# Patient Record
Sex: Male | Born: 1940
Health system: Southern US, Community
[De-identification: ages and names within clinical notes are randomized; demographics above are authoritative.]

## PROBLEM LIST (undated history)

## (undated) DIAGNOSIS — I10 Essential (primary) hypertension: Secondary | ICD-10-CM

## (undated) DIAGNOSIS — E119 Type 2 diabetes mellitus without complications: Secondary | ICD-10-CM

## (undated) DIAGNOSIS — M109 Gout, unspecified: Secondary | ICD-10-CM

---

## 2012-03-15 DIAGNOSIS — I2510 Atherosclerotic heart disease of native coronary artery without angina pectoris: Secondary | ICD-10-CM

## 2012-03-15 HISTORY — DX: Atherosclerotic heart disease of native coronary artery without angina pectoris: I25.10

## 2012-03-15 HISTORY — PX: CARDIAC CATHETERIZATION: SHX172

## 2012-06-09 DIAGNOSIS — A4101 Sepsis due to Methicillin susceptible Staphylococcus aureus: Secondary | ICD-10-CM

## 2012-06-09 HISTORY — DX: Sepsis due to methicillin susceptible Staphylococcus aureus: A41.01

## 2013-04-19 DIAGNOSIS — G4733 Obstructive sleep apnea (adult) (pediatric): Secondary | ICD-10-CM

## 2013-04-19 HISTORY — DX: Obstructive sleep apnea (adult) (pediatric): G47.33

## 2019-05-31 ENCOUNTER — Inpatient Hospital Stay (HOSPITAL_COMMUNITY): Payer: Medicare Other

## 2019-05-31 ENCOUNTER — Inpatient Hospital Stay (HOSPITAL_COMMUNITY)
Admission: AD | Admit: 2019-05-31 | Discharge: 2019-06-07 | DRG: 814 | Disposition: A | Discharge status: Home Health | Payer: Medicare Other | Source: Other Acute Inpatient Hospital | Attending: Internal Medicine | Admitting: Internal Medicine

## 2019-05-31 ENCOUNTER — Encounter (HOSPITAL_COMMUNITY): Payer: Self-pay | Admitting: Internal Medicine

## 2019-05-31 DIAGNOSIS — M109 Gout, unspecified: Secondary | ICD-10-CM | POA: Diagnosis present

## 2019-05-31 DIAGNOSIS — Z8262 Family history of osteoporosis: Secondary | ICD-10-CM

## 2019-05-31 DIAGNOSIS — R579 Shock, unspecified: Secondary | ICD-10-CM | POA: Diagnosis not present

## 2019-05-31 DIAGNOSIS — R2232 Localized swelling, mass and lump, left upper limb: Secondary | ICD-10-CM | POA: Diagnosis not present

## 2019-05-31 DIAGNOSIS — Z79899 Other long term (current) drug therapy: Secondary | ICD-10-CM | POA: Diagnosis not present

## 2019-05-31 DIAGNOSIS — E872 Acidosis: Secondary | ICD-10-CM | POA: Diagnosis present

## 2019-05-31 DIAGNOSIS — E860 Dehydration: Secondary | ICD-10-CM | POA: Diagnosis present

## 2019-05-31 DIAGNOSIS — R748 Abnormal levels of other serum enzymes: Secondary | ICD-10-CM

## 2019-05-31 DIAGNOSIS — A419 Sepsis, unspecified organism: Secondary | ICD-10-CM | POA: Diagnosis present

## 2019-05-31 DIAGNOSIS — I4891 Unspecified atrial fibrillation: Secondary | ICD-10-CM | POA: Diagnosis not present

## 2019-05-31 DIAGNOSIS — I509 Heart failure, unspecified: Secondary | ICD-10-CM | POA: Diagnosis present

## 2019-05-31 DIAGNOSIS — E1165 Type 2 diabetes mellitus with hyperglycemia: Secondary | ICD-10-CM | POA: Diagnosis present

## 2019-05-31 DIAGNOSIS — T504X5A Adverse effect of drugs affecting uric acid metabolism, initial encounter: Secondary | ICD-10-CM | POA: Diagnosis present

## 2019-05-31 DIAGNOSIS — I110 Hypertensive heart disease with heart failure: Secondary | ICD-10-CM | POA: Diagnosis present

## 2019-05-31 DIAGNOSIS — E785 Hyperlipidemia, unspecified: Secondary | ICD-10-CM | POA: Diagnosis present

## 2019-05-31 DIAGNOSIS — D649 Anemia, unspecified: Secondary | ICD-10-CM | POA: Diagnosis present

## 2019-05-31 DIAGNOSIS — F17220 Nicotine dependence, chewing tobacco, uncomplicated: Secondary | ICD-10-CM | POA: Diagnosis present

## 2019-05-31 DIAGNOSIS — Z7982 Long term (current) use of aspirin: Secondary | ICD-10-CM

## 2019-05-31 DIAGNOSIS — R21 Rash and other nonspecific skin eruption: Secondary | ICD-10-CM

## 2019-05-31 DIAGNOSIS — R571 Hypovolemic shock: Secondary | ICD-10-CM | POA: Diagnosis present

## 2019-05-31 DIAGNOSIS — Z8249 Family history of ischemic heart disease and other diseases of the circulatory system: Secondary | ICD-10-CM | POA: Diagnosis not present

## 2019-05-31 DIAGNOSIS — R0600 Dyspnea, unspecified: Secondary | ICD-10-CM

## 2019-05-31 DIAGNOSIS — D696 Thrombocytopenia, unspecified: Secondary | ICD-10-CM | POA: Diagnosis present

## 2019-05-31 DIAGNOSIS — I77810 Thoracic aortic ectasia: Secondary | ICD-10-CM | POA: Diagnosis present

## 2019-05-31 DIAGNOSIS — N179 Acute kidney failure, unspecified: Secondary | ICD-10-CM

## 2019-05-31 DIAGNOSIS — I480 Paroxysmal atrial fibrillation: Secondary | ICD-10-CM | POA: Diagnosis present

## 2019-05-31 DIAGNOSIS — I420 Dilated cardiomyopathy: Secondary | ICD-10-CM | POA: Diagnosis present

## 2019-05-31 DIAGNOSIS — E119 Type 2 diabetes mellitus without complications: Secondary | ICD-10-CM | POA: Diagnosis not present

## 2019-05-31 DIAGNOSIS — I081 Rheumatic disorders of both mitral and tricuspid valves: Secondary | ICD-10-CM | POA: Diagnosis present

## 2019-05-31 DIAGNOSIS — I2510 Atherosclerotic heart disease of native coronary artery without angina pectoris: Secondary | ICD-10-CM | POA: Diagnosis present

## 2019-05-31 DIAGNOSIS — D7212 Drug rash with eosinophilia and systemic symptoms syndrome: Secondary | ICD-10-CM | POA: Diagnosis present

## 2019-05-31 DIAGNOSIS — Z888 Allergy status to other drugs, medicaments and biological substances status: Secondary | ICD-10-CM

## 2019-05-31 DIAGNOSIS — I340 Nonrheumatic mitral (valve) insufficiency: Secondary | ICD-10-CM | POA: Diagnosis not present

## 2019-05-31 DIAGNOSIS — T50905A Adverse effect of unspecified drugs, medicaments and biological substances, initial encounter: Secondary | ICD-10-CM | POA: Diagnosis not present

## 2019-05-31 DIAGNOSIS — M7989 Other specified soft tissue disorders: Secondary | ICD-10-CM | POA: Diagnosis not present

## 2019-05-31 HISTORY — DX: Gout, unspecified: M10.9

## 2019-05-31 HISTORY — DX: Essential (primary) hypertension: I10

## 2019-05-31 HISTORY — DX: Sepsis, unspecified organism: A41.9

## 2019-05-31 HISTORY — DX: Type 2 diabetes mellitus without complications: E11.9

## 2019-05-31 HISTORY — DX: Shock, unspecified: R57.9

## 2019-05-31 HISTORY — DX: Acute kidney failure, unspecified: N17.9

## 2019-05-31 LAB — POCT I-STAT 7, (LYTES, BLD GAS, ICA,H+H)
Acid-base deficit: 11 mmol/L — ABNORMAL HIGH (ref 0.0–2.0)
Bicarbonate: 14.7 mmol/L — ABNORMAL LOW (ref 20.0–28.0)
Calcium, Ion: 1.11 mmol/L — ABNORMAL LOW (ref 1.15–1.40)
HCT: 25 % — ABNORMAL LOW (ref 39.0–52.0)
Hemoglobin: 8.5 g/dL — ABNORMAL LOW (ref 13.0–17.0)
O2 Saturation: 95 %
Potassium: 5.1 mmol/L (ref 3.5–5.1)
Sodium: 139 mmol/L (ref 135–145)
TCO2: 16 mmol/L — ABNORMAL LOW (ref 22–32)
pCO2 arterial: 30.2 mmHg — ABNORMAL LOW (ref 32.0–48.0)
pH, Arterial: 7.295 — ABNORMAL LOW (ref 7.350–7.450)
pO2, Arterial: 82 mmHg — ABNORMAL LOW (ref 83.0–108.0)

## 2019-05-31 LAB — GLUCOSE, CAPILLARY: Glucose-Capillary: 164 mg/dL — ABNORMAL HIGH (ref 70–99)

## 2019-05-31 MED ORDER — SODIUM CHLORIDE 0.9 % IV SOLN
10.00 | INTRAVENOUS | Status: DC
Start: ? — End: 2019-05-31

## 2019-05-31 MED ORDER — NOREPINEPHRINE 4 MG/250ML-% IV SOLN
0.0000 ug/min | INTRAVENOUS | Status: DC
  Administered 2019-05-31: 9 ug/min via INTRAVENOUS

## 2019-05-31 MED ORDER — NOREPINEPHRINE 16 MG/250ML-% IV SOLN: 0.0000 ug/min | INTRAVENOUS | Status: DC

## 2019-05-31 MED ORDER — LACTATED RINGERS IV SOLN: INTRAVENOUS | Status: DC

## 2019-05-31 MED ORDER — LACTATED RINGERS IV SOLN
100.00 | INTRAVENOUS | Status: DC
Start: ? — End: 2019-05-31

## 2019-05-31 MED ORDER — HEPARIN SODIUM (PORCINE) 5000 UNIT/ML IJ SOLN
5000.0000 [IU] | Freq: Three times a day (TID) | INTRAMUSCULAR | Status: DC
  Administered 2019-05-31 – 2019-06-04 (×11): 5000 [IU] via SUBCUTANEOUS
  Filled 2019-05-31 (×11): qty 1

## 2019-05-31 MED ORDER — GENERIC EXTERNAL MEDICATION
Status: DC
Start: ? — End: 2019-05-31

## 2019-05-31 MED ORDER — STERILE WATER FOR INJECTION IV SOLN
INTRAVENOUS | Status: DC
  Filled 2019-05-31 (×10): qty 850

## 2019-05-31 MED ORDER — SODIUM CHLORIDE 0.9 % IV SOLN
INTRAVENOUS | Status: DC | PRN
  Administered 2019-05-31: 250 mL via INTRAVENOUS

## 2019-05-31 MED ORDER — WHITE PETROLATUM EX OINT
TOPICAL_OINTMENT | CUTANEOUS | Status: AC
  Filled 2019-05-31: qty 28.35

## 2019-05-31 MED ORDER — NOREPINEPHRINE-DEXTROSE 8-5 MG/250ML-% IV SOLN
0.10 | INTRAVENOUS | Status: DC
Start: ? — End: 2019-05-31

## 2019-05-31 MED ORDER — NOREPINEPHRINE 4 MG/250ML-% IV SOLN
INTRAVENOUS | Status: AC
  Filled 2019-05-31: qty 250

## 2019-05-31 NOTE — Significant Event (Signed)
Returned call from [facility] regarding this patient who is at [facility] (free-standing ED about 30 mins away). They were initially accepted to [facility] by hospitalist service, but their clinical condition has deteriorated and they now require ICU level care.  Briefly, this is a 48M who presented today with generalized malaise and a diffuse whole-body rash. He was found to be hypotensive. Initially fluid-responsive. Received 3L IVF in total. BP then began dropping again (currently SBP 70s) so ED physician is starting them on a levophed drip prior to transfer to [facility].  He has received vanc/cefepime empirically for possible septic shock.  Labs are notable for slight leukocytosis with 8% eosinophilia (ANC ~1000k) which raises suspicion for DRESS syndrome given the patient's toxic presentation and whole body rash. I have asked the ER to enquire about any new drugs since the patient's mentation may worsen between now and when he arrives at [facility]. I have also asked them to administer IV steroids to empirically treat for both shock state and possible DRESS.  I also note poor respiratory compensation for his metabolic acidosis: ABG: 7.19/39 on arrival. I have asked the ED to repeat this prior to transfer to rule out impending respiratory failure with concomitant failure to compensate for his acidosis as an etiology for his worsening shock/hypotension.  Plan: - Repeat ABG. - CVL to be placed by ER prior to transport given levophed requirement and worsening shock - IV steroids to be given by ER prior to transport. - Accepted to [facility] ICU for management of emergent life-threatening condition. Acccepting physician is Dr. Clayton Lefort.

## 2019-05-31 NOTE — H&P (Signed)
NAME:  Zachary Parrish, MRN:  [id number], DOB:  05-17-1940, LOS: 0 ADMISSION DATE:  05/31/2019, CONSULTATION DATE: 3/18 REFERRING MD:  Dr. Malachi Carl EDP, CHIEF COMPLAINT:  shock  Brief History   79 year old male admitted to [facility] on 3/18 with shock requiring Levophed.  Complaints of rash and malaise for the preceding 3 weeks after starting colchicine and allopurinol for gout.  History of present illness   79 year old male with past medical history as below, which is significant for hypertension, diabetes, and gout.  He was in his usual state of health until about 5 to 6 weeks prior to arrival when he developed unilateral knee pain and swelling.  He had experiences months prior but the symptoms were short-lived.  He presented to his PCP with these complaints and was diagnosed with gout and started on allopurinol and colchicine.  A few days after these medications he developed diarrhea and abdominal pain.  For these reasons he stopped colchicine at the request of his primary care physician.  His symptoms continued to progress and started to include rash, poor appetite, and weakness.  It was at this time approximately 3/9 that his allopurinol was discontinued.  Since that time his fatigue and malaise have been progressive and his rash not improved.  On 3/18 he was noted to be hypotensive on home BP monitor with systolic blood pressure of 70.  He presented to [facility] emergency department.  Where his blood pressure was confirmed to be low and he was given 3 L of normal saline.  He was empirically treated with vancomycin and cefepime.  Laboratory evaluation was significant for creatinine of 7.16, lactic acid 2.7, and elevated LFTs.  He was scheduled for transfer to stepdown unit at [facility], however, his blood pressure began to decrease he was started on Levophed.  At that point he was transferred to [facility], ICU.  Past Medical History   has a past medical history of DM (diabetes  mellitus) ([place]), Gout, and HTN (hypertension).   Significant Hospital Events   3/18  Consults:    Procedures:  CVL RIJ 3/18 >  Significant Diagnostic Tests:  CT abdomen 3/18 >  Micro Data:  Blood 3/18 >   Antimicrobials:  Cefepime 3/18 Vancomycin 3/18  Interim history/subjective:  See HPI  Objective   Blood pressure (!) 129/48, pulse 77, resp. rate 18, height 6\' 1"  (1.854 m), weight 100.2 kg, SpO2 96 %.        Intake/Output Summary (Last 24 hours) at 05/31/2019 2243 Last data filed at 05/31/2019 2215 Gross per 24 hour  Intake --  Output 150 ml  Net -150 ml   Filed Weights   05/31/19 2219  Weight: 100.2 kg    Examination: General: elderly appearing male in NAD HENT: [place]/AT, PERRL, no JVD Lungs: Clear bilateral breath sounds Cardiovascular: RRR no MRG Abdomen: Soft, non-tender, non-distended Extremities: No acute deformity or ROM limitation Neuro: Alert, oriented, non-focal GU: Foley Skin: Non-raised rash covering almost entire body. Sparing face, palms, and soles of feet. No blisters or sloughing.   Resolved Hospital Problem list     Assessment & Plan:   Shock: I suspect he remains hypovolemic as a chief etiology. He is afebrile with only mild leukocytosis and no infectious complaints. Cannot rule out septic shock. PCT elevated at 0.52 in ED. - Continue levophed for MAP goal 57mmHg - Resuscitate with additional one liter LR - Check lactic - Empiric antibiotics - Blood cultures - Trend PCT. Low threshold to DC  antibiotics.  - Echocardiogram  Rash: DRESS syndrome? He has rash, eosinophilia, multi-organ involvement. Scores 4 on Registry of Severe Cutaneous Adverse Reactions (RegiSCAR) scoring system which would indicate probable DRESS. Rash and duration atypical for SHS. Allopurinol and colchicine were new medications for him about 5-6 weeks ago. Colchicine stopped after about 2 weeks and Allopurinol stopped only about one week ago.  - Patch testing  unlikely to be positive in cases of allopurinol induced DRESS - Systemic glucocorticoids - Holding allopurinol  AKI: prerenal vs DRESS - Hydrate - Steroids - Follow BMP  Transaminitis: shock liver +/- DRESS complication - Trend LFT  Non gap metabolic acidosis: pH in ED  7.19 renal failure - check lactic, BMP - Start Bicarb infusion  Troponin elevation: 0.044 at [facility] ED - EKG - Trend troponin  Best practice:  Diet: NPO Pain/Anxiety/Delirium protocol (if indicated): NA VAP protocol (if indicated): NA DVT prophylaxis: SQH GI prophylaxis: NA Glucose control: NA Mobility: BR Code Status: FULL Family Communication: Patient updated bedside Disposition: ICU - pressors  Labs   CBC: No results for input(s): WBC, NEUTROABS, HGB, HCT, MCV, PLT in the last 168 hours.  Basic Metabolic Panel: No results for input(s): NA, K, CL, CO2, GLUCOSE, BUN, CREATININE, CALCIUM, MG, PHOS in the last 168 hours. GFR: CrCl cannot be calculated (No successful lab value found.). No results for input(s): PROCALCITON, WBC, LATICACIDVEN in the last 168 hours.  Liver Function Tests: No results for input(s): AST, ALT, ALKPHOS, BILITOT, PROT, ALBUMIN in the last 168 hours. No results for input(s): LIPASE, AMYLASE in the last 168 hours. No results for input(s): AMMONIA in the last 168 hours.  ABG No results found for: PHART, PCO2ART, PO2ART, HCO3, TCO2, ACIDBASEDEF, O2SAT   Coagulation Profile: No results for input(s): INR, PROTIME in the last 168 hours.  Cardiac Enzymes: No results for input(s): CKTOTAL, CKMB, CKMBINDEX, TROPONINI in the last 168 hours.  HbA1C: No results found for: HGBA1C  CBG: No results for input(s): GLUCAP in the last 168 hours.  Review of Systems:   Bolds are positive  Constitutional: weight loss, gain, night sweats, Fevers, chills, fatigue .  HEENT: headaches, Sore throat, sneezing, nasal congestion, post nasal drip, Difficulty swallowing, Tooth/dental  problems, visual complaints visual changes, ear ache CV:  chest pain, radiates:,Orthopnea, PND, swelling in lower extremities, dizziness, palpitations, syncope.  GI  heartburn, indigestion, abdominal pain, nausea, vomiting, diarrhea, change in bowel habits, loss of appetite, bloody stools.  Resp: cough, productive: , hemoptysis, dyspnea, chest pain, pleuritic.  Skin: rash or itching or icterus GU: dysuria, change in color of urine, urgency or frequency. flank pain, hematuria  MS: joint pain or swelling. decreased range of motion  Psych: change in mood or affect. depression or anxiety.  Neuro: difficulty with speech, weakness, numbness, ataxia    Past Medical History  He,  has no past medical history on file.   Surgical History     Social History      Family History   His family history is not on file.   Allergies Not on File   Home Medications  Prior to Admission medications   Not on File     Critical care time: 50 minutes     Joneen Roach, AGACNP-BC [facility] Pulmonary/Critical Care  See Amion for personal pager PCCM on call pager [phone]  05/31/2019 11:45 PM       ,

## 2019-06-01 ENCOUNTER — Inpatient Hospital Stay (HOSPITAL_COMMUNITY): Payer: Medicare Other

## 2019-06-01 DIAGNOSIS — R571 Hypovolemic shock: Secondary | ICD-10-CM

## 2019-06-01 LAB — COMPREHENSIVE METABOLIC PANEL
ALT: 124 U/L — ABNORMAL HIGH (ref 0–44)
ALT: 116 U/L — ABNORMAL HIGH (ref 0–44)
ALT: 113 U/L — ABNORMAL HIGH (ref 0–44)
ALT: 113 U/L — ABNORMAL HIGH (ref 0–44)
AST: 122 U/L — ABNORMAL HIGH (ref 15–41)
AST: 111 U/L — ABNORMAL HIGH (ref 15–41)
AST: 98 U/L — ABNORMAL HIGH (ref 15–41)
AST: 99 U/L — ABNORMAL HIGH (ref 15–41)
Albumin: 2.5 g/dL — ABNORMAL LOW (ref 3.5–5.0)
Albumin: 2.4 g/dL — ABNORMAL LOW (ref 3.5–5.0)
Albumin: 2.3 g/dL — ABNORMAL LOW (ref 3.5–5.0)
Albumin: 2.4 g/dL — ABNORMAL LOW (ref 3.5–5.0)
Alkaline Phosphatase: 178 U/L — ABNORMAL HIGH (ref 38–126)
Alkaline Phosphatase: 175 U/L — ABNORMAL HIGH (ref 38–126)
Alkaline Phosphatase: 186 U/L — ABNORMAL HIGH (ref 38–126)
Alkaline Phosphatase: 196 U/L — ABNORMAL HIGH (ref 38–126)
Anion gap: 14 (ref 5–15)
Anion gap: 14 (ref 5–15)
Anion gap: 12 (ref 5–15)
Anion gap: 14 (ref 5–15)
BUN: 121 mg/dL — ABNORMAL HIGH (ref 8–23)
BUN: 115 mg/dL — ABNORMAL HIGH (ref 8–23)
BUN: 110 mg/dL — ABNORMAL HIGH (ref 8–23)
BUN: 114 mg/dL — ABNORMAL HIGH (ref 8–23)
CO2: 14 mmol/L — ABNORMAL LOW (ref 22–32)
CO2: 16 mmol/L — ABNORMAL LOW (ref 22–32)
CO2: 19 mmol/L — ABNORMAL LOW (ref 22–32)
CO2: 18 mmol/L — ABNORMAL LOW (ref 22–32)
Calcium: 7.2 mg/dL — ABNORMAL LOW (ref 8.9–10.3)
Calcium: 7.3 mg/dL — ABNORMAL LOW (ref 8.9–10.3)
Calcium: 7.4 mg/dL — ABNORMAL LOW (ref 8.9–10.3)
Calcium: 7.4 mg/dL — ABNORMAL LOW (ref 8.9–10.3)
Chloride: 110 mmol/L (ref 98–111)
Chloride: 109 mmol/L (ref 98–111)
Chloride: 109 mmol/L (ref 98–111)
Chloride: 107 mmol/L (ref 98–111)
Creatinine, Ser: 6.49 mg/dL — ABNORMAL HIGH (ref 0.61–1.24)
Creatinine, Ser: 6.09 mg/dL — ABNORMAL HIGH (ref 0.61–1.24)
Creatinine, Ser: 5.7 mg/dL — ABNORMAL HIGH (ref 0.61–1.24)
Creatinine, Ser: 5.68 mg/dL — ABNORMAL HIGH (ref 0.61–1.24)
GFR calc Af Amer: 9 mL/min — ABNORMAL LOW (ref >60)
GFR calc Af Amer: 9 mL/min — ABNORMAL LOW (ref >60)
GFR calc Af Amer: 10 mL/min — ABNORMAL LOW (ref >60)
GFR calc Af Amer: 10 mL/min — ABNORMAL LOW (ref >60)
GFR calc non Af Amer: 8 mL/min — ABNORMAL LOW (ref >60)
GFR calc non Af Amer: 8 mL/min — ABNORMAL LOW (ref >60)
GFR calc non Af Amer: 9 mL/min — ABNORMAL LOW (ref >60)
GFR calc non Af Amer: 9 mL/min — ABNORMAL LOW (ref >60)
Glucose, Bld: 186 mg/dL — ABNORMAL HIGH (ref 70–99)
Glucose, Bld: 176 mg/dL — ABNORMAL HIGH (ref 70–99)
Glucose, Bld: 187 mg/dL — ABNORMAL HIGH (ref 70–99)
Glucose, Bld: 226 mg/dL — ABNORMAL HIGH (ref 70–99)
Potassium: 5.2 mmol/L — ABNORMAL HIGH (ref 3.5–5.1)
Potassium: 5.1 mmol/L (ref 3.5–5.1)
Potassium: 4.8 mmol/L (ref 3.5–5.1)
Potassium: 4.7 mmol/L (ref 3.5–5.1)
Sodium: 138 mmol/L (ref 135–145)
Sodium: 139 mmol/L (ref 135–145)
Sodium: 140 mmol/L (ref 135–145)
Sodium: 139 mmol/L (ref 135–145)
Total Bilirubin: 2.4 mg/dL — ABNORMAL HIGH (ref 0.3–1.2)
Total Bilirubin: 2.5 mg/dL — ABNORMAL HIGH (ref 0.3–1.2)
Total Bilirubin: 1.9 mg/dL — ABNORMAL HIGH (ref 0.3–1.2)
Total Bilirubin: 2.3 mg/dL — ABNORMAL HIGH (ref 0.3–1.2)
Total Protein: 5.3 g/dL — ABNORMAL LOW (ref 6.5–8.1)
Total Protein: 5.1 g/dL — ABNORMAL LOW (ref 6.5–8.1)
Total Protein: 5.4 g/dL — ABNORMAL LOW (ref 6.5–8.1)
Total Protein: 5.3 g/dL — ABNORMAL LOW (ref 6.5–8.1)

## 2019-06-01 LAB — CBC WITH DIFFERENTIAL/PLATELET
Abs Immature Granulocytes: 0.06 10*3/uL (ref 0.00–0.07)
Basophils Absolute: 0.1 10*3/uL (ref 0.0–0.1)
Basophils Relative: 1 %
Eosinophils Absolute: 0.2 10*3/uL (ref 0.0–0.5)
Eosinophils Relative: 3 %
HCT: 25.5 % — ABNORMAL LOW (ref 39.0–52.0)
Hemoglobin: 8.1 g/dL — ABNORMAL LOW (ref 13.0–17.0)
Immature Granulocytes: 1 %
Lymphocytes Relative: 38 %
Lymphs Abs: 3.2 10*3/uL (ref 0.7–4.0)
MCH: 32.1 pg (ref 26.0–34.0)
MCHC: 31.8 g/dL (ref 30.0–36.0)
MCV: 101.2 fL — ABNORMAL HIGH (ref 80.0–100.0)
Monocytes Absolute: 1.3 10*3/uL — ABNORMAL HIGH (ref 0.1–1.0)
Monocytes Relative: 16 %
Neutro Abs: 3.4 10*3/uL (ref 1.7–7.7)
Neutrophils Relative %: 41 %
Platelets: 99 10*3/uL — ABNORMAL LOW (ref 150–400)
RBC: 2.52 MIL/uL — ABNORMAL LOW (ref 4.22–5.81)
RDW: 17.1 % — ABNORMAL HIGH (ref 11.5–15.5)
WBC: 8.3 10*3/uL (ref 4.0–10.5)
nRBC: 0 % (ref 0.0–0.2)

## 2019-06-01 LAB — HEMOGLOBIN A1C
Hgb A1c MFr Bld: 7.2 % — ABNORMAL HIGH (ref 4.8–5.6)
Mean Plasma Glucose: 159.94 mg/dL

## 2019-06-01 LAB — URINALYSIS, ROUTINE W REFLEX MICROSCOPIC
Bilirubin Urine: NEGATIVE
Glucose, UA: NEGATIVE mg/dL
Ketones, ur: NEGATIVE mg/dL
Leukocytes,Ua: NEGATIVE
Nitrite: NEGATIVE
Protein, ur: 100 mg/dL — AB
Specific Gravity, Urine: 1.014 (ref 1.005–1.030)
pH: 5 (ref 5.0–8.0)

## 2019-06-01 LAB — TROPONIN I (HIGH SENSITIVITY)
Troponin I (High Sensitivity): 17 ng/L (ref <18)
Troponin I (High Sensitivity): 18 ng/L — ABNORMAL HIGH (ref <18)

## 2019-06-01 LAB — CBC
HCT: 26.5 % — ABNORMAL LOW (ref 39.0–52.0)
Hemoglobin: 8.5 g/dL — ABNORMAL LOW (ref 13.0–17.0)
MCH: 32.2 pg (ref 26.0–34.0)
MCHC: 32.1 g/dL (ref 30.0–36.0)
MCV: 100.4 fL — ABNORMAL HIGH (ref 80.0–100.0)
Platelets: 97 10*3/uL — ABNORMAL LOW (ref 150–400)
RBC: 2.64 MIL/uL — ABNORMAL LOW (ref 4.22–5.81)
RDW: 17.1 % — ABNORMAL HIGH (ref 11.5–15.5)
WBC: 8.7 10*3/uL (ref 4.0–10.5)
nRBC: 0 % (ref 0.0–0.2)

## 2019-06-01 LAB — PROTIME-INR
INR: 1.3 — ABNORMAL HIGH (ref 0.8–1.2)
Prothrombin Time: 16 seconds — ABNORMAL HIGH (ref 11.4–15.2)

## 2019-06-01 LAB — LACTIC ACID, PLASMA: Lactic Acid, Venous: 1.3 mmol/L (ref 0.5–1.9)

## 2019-06-01 LAB — ECHOCARDIOGRAM COMPLETE
Height: 73 in
Weight: 3534.41 oz

## 2019-06-01 LAB — GLUCOSE, CAPILLARY
Glucose-Capillary: 160 mg/dL — ABNORMAL HIGH (ref 70–99)
Glucose-Capillary: 146 mg/dL — ABNORMAL HIGH (ref 70–99)
Glucose-Capillary: 169 mg/dL — ABNORMAL HIGH (ref 70–99)
Glucose-Capillary: 197 mg/dL — ABNORMAL HIGH (ref 70–99)
Glucose-Capillary: 219 mg/dL — ABNORMAL HIGH (ref 70–99)

## 2019-06-01 LAB — LIPASE, BLOOD: Lipase: 100 U/L — ABNORMAL HIGH (ref 11–51)

## 2019-06-01 LAB — PHOSPHORUS
Phosphorus: 6 mg/dL — ABNORMAL HIGH (ref 2.5–4.6)
Phosphorus: 6.1 mg/dL — ABNORMAL HIGH (ref 2.5–4.6)

## 2019-06-01 LAB — MRSA PCR SCREENING: MRSA by PCR: NEGATIVE

## 2019-06-01 LAB — PROCALCITONIN: Procalcitonin: 0.42 ng/mL

## 2019-06-01 LAB — AMYLASE: Amylase: 51 U/L (ref 28–100)

## 2019-06-01 LAB — CORTISOL: Cortisol, Plasma: >100 ug/dL

## 2019-06-01 LAB — MAGNESIUM
Magnesium: 1.7 mg/dL (ref 1.7–2.4)
Magnesium: 1.8 mg/dL (ref 1.7–2.4)

## 2019-06-01 MED ORDER — PREDNISONE 20 MG PO TABS
50.0000 mg | ORAL_TABLET | Freq: Every day | ORAL | Status: DC
  Administered 2019-06-01 – 2019-06-07 (×7): 50 mg via ORAL
  Filled 2019-06-01 (×5): qty 2
  Filled 2019-06-01: qty 5
  Filled 2019-06-01: qty 2

## 2019-06-01 MED ORDER — HYDROCERIN EX CREA
TOPICAL_CREAM | CUTANEOUS | Status: DC | PRN
  Filled 2019-06-01: qty 113

## 2019-06-01 MED ORDER — SODIUM CHLORIDE 0.9 % IV SOLN: 2.0000 g | INTRAVENOUS | Status: DC

## 2019-06-01 MED ORDER — FAMOTIDINE 20 MG PO TABS
20.0000 mg | ORAL_TABLET | Freq: Every day | ORAL | Status: DC
  Administered 2019-06-02 – 2019-06-07 (×6): 20 mg via ORAL
  Filled 2019-06-01 (×6): qty 1

## 2019-06-01 MED ORDER — DIPHENHYDRAMINE HCL 50 MG/ML IJ SOLN: 50.0000 mg | Freq: Four times a day (QID) | INTRAMUSCULAR | Status: DC | PRN

## 2019-06-01 MED ORDER — SODIUM CHLORIDE 0.9 % IV SOLN
1.0000 g | INTRAVENOUS | Status: DC
  Filled 2019-06-01: qty 1

## 2019-06-01 MED ORDER — INSULIN ASPART 100 UNIT/ML ~~LOC~~ SOLN
0.0000 [IU] | Freq: Three times a day (TID) | SUBCUTANEOUS | Status: DC
  Administered 2019-06-01 (×2): 2 [IU] via SUBCUTANEOUS
  Administered 2019-06-01: 1 [IU] via SUBCUTANEOUS
  Administered 2019-06-02 (×3): 3 [IU] via SUBCUTANEOUS
  Administered 2019-06-03: 2 [IU] via SUBCUTANEOUS
  Administered 2019-06-03: 5 [IU] via SUBCUTANEOUS
  Administered 2019-06-03: 2 [IU] via SUBCUTANEOUS
  Administered 2019-06-04 (×2): 3 [IU] via SUBCUTANEOUS
  Administered 2019-06-04 – 2019-06-05 (×2): 2 [IU] via SUBCUTANEOUS
  Administered 2019-06-05 – 2019-06-07 (×6): 3 [IU] via SUBCUTANEOUS

## 2019-06-01 MED ORDER — FAMOTIDINE 20 MG PO TABS: 20.0000 mg | ORAL_TABLET | Freq: Two times a day (BID) | ORAL | Status: DC

## 2019-06-01 MED ORDER — HYDROCORTISONE NA SUCCINATE PF 100 MG IJ SOLR
50.0000 mg | Freq: Four times a day (QID) | INTRAMUSCULAR | Status: DC
  Administered 2019-06-01 (×2): 50 mg via INTRAVENOUS
  Filled 2019-06-01 (×2): qty 2

## 2019-06-01 MED ORDER — GENERIC EXTERNAL MEDICATION
Status: DC
Start: ? — End: 2019-06-01

## 2019-06-01 MED ORDER — INSULIN ASPART 100 UNIT/ML ~~LOC~~ SOLN
0.0000 [IU] | Freq: Every day | SUBCUTANEOUS | Status: DC
  Administered 2019-06-01 – 2019-06-02 (×2): 2 [IU] via SUBCUTANEOUS
  Administered 2019-06-04: 3 [IU] via SUBCUTANEOUS
  Administered 2019-06-05: 2 [IU] via SUBCUTANEOUS
  Administered 2019-06-06: 3 [IU] via SUBCUTANEOUS

## 2019-06-01 NOTE — Progress Notes (Signed)
Report called to receiving Rn [phone],. Patient with no complaints at the current time. Spouse at bedside and aware of transfer.

## 2019-06-01 NOTE — Progress Notes (Addendum)
NAME:  Zachary Parrish, MRN:  [id number], DOB:  Oct 12, 1940, LOS: 1 ADMISSION DATE:  05/31/2019, CONSULTATION DATE: 3/18 REFERRING MD:  Dr. August Luz EDP, CHIEF COMPLAINT:  shock  Brief History   79 [facility] with shock requiring Levophed.  Complaints of rash and malaise for the preceding 3 weeks after starting colchicine and allopurinol for gout.  Past Medical History   has a past medical history of DM (diabetes mellitus) ([facility]), Gout, and HTN (hypertension).   Significant Hospital Events   3/18: Admitted with presumptive diagnosis of multiple organ involvement-DRESS syndrome either allopurinol or colchicine related had acute renal failure and dehydration.  Treated with bicarbonate infusion 3/19 antibiotics stopped, acid-base and renal function improving  Consults:    Procedures:  CVL RIJ 3/18 >  Significant Diagnostic Tests:  CT abdomen 3/18 >  Micro Data:  Blood 3/18 >   Antimicrobials:  Cefepime 3/18>>3/19 Vancomycin 3/18>>3/19  Interim history/subjective:    Objective   Blood pressure (Abnormal) 116/56, pulse 77, temperature 97.7 F (36.5 C), resp. rate 20, height 6\' 1"  (1.854 m), weight 100.2 kg, SpO2 99 %.        Intake/Output Summary (Last 24 hours) at 06/01/2019 1009 Last data filed at 06/01/2019 0800 Gross per 24 hour  Intake 1828.28 ml  Output 720 ml  Net 1108.28 ml   Filed Weights   05/31/19 2219  Weight: 100.2 kg    Examination:  General 79 year old male resting in bed he is in no acute distress this morning HEENT normocephalic atraumatic no jugular venous distention appreciated lips are cracked and dry however mucous membranes are moist otherwise Breath sounds clear to auscultation Cardiac regular rate and rhythm Abdomen soft not tender no organomegaly Extremities are warm and dry with brisk capillary refill and excellent range of motion GU with Foley catheter concentrated yellow urine Derm: Nonraised  erythemic rash over entire body no blisters or sloughing    Resolved Hospital Problem list     Assessment & Plan:   Shock, responded to IV fluids, suspect hypovolemia Plan Continue bicarbonate infusion He can go to progressive care unit Mobilize Discontinue antibiotics   Rash: DRESS syndrome? He has rash, eosinophilia, multi-organ involvement. Scores 4 on Registry of Severe Cutaneous Adverse Reactions (RegiSCAR) scoring system which would indicate probable DRESS. Rash and duration atypical for SHS. Allopurinol and colchicine were new medications for him about 5-6 weeks ago. Colchicine stopped after about 2 weeks and Allopurinol stopped only about one week ago.  - Patch testing unlikely to be positive in cases of allopurinol induced DRESS Plan Continue systemic steroids, WILL NEED SLOW TAPER; over 8-12 weeks Placing allopurinol as allergy  AKI: prerenal vs DRESS.  Renal function slowly improving Plan Continue IV hydration with bicarbonate and systemic steroids; change to pred 50. Serial chemistries Strict intake output  Transaminitis: shock liver +/- DRESS complication, trending downward Plan Continue to trend  Non gap metabolic acidosis: pH in ED  7.19 renal failure -Acid-base slowly improving Plan Continue sodium bicarbonate infusion continue to trend chemistries, every 12  Troponin elevation: 0.044 at [facility] ED Plan Continue to trend troponins  Hyperglycemia , Elevated hemoglobin A1c, diabetes Plan Sliding scale insulin  Anemia without evidence of bleeding, with mild, cytopenia Plan Continue to trend  Best practice:  Diet: NPO Pain/Anxiety/Delirium protocol (if indicated): NA VAP protocol (if indicated): NA DVT prophylaxis: SQH GI prophylaxis: NA Glucose control: NA Mobility: BR Code Status: FULL Family Communication: Patient updated bedside Disposition: ICU - progressive  care     06/01/2019 10:09 AM       ,

## 2019-06-01 NOTE — Progress Notes (Signed)
Pharmacy Antibiotic Note  Zachary Parrish is a 79 y.o. male admitted on 05/31/2019 with sepsis.  Pharmacy has been consulted for cefepime dosing.  Pt rec'd cefepime 2g (3/18 1223), doxycycline 100mg  (3/18 1659), and vanc 2g (3/18 1308) at outside ED.  Pt in acute on CKD, baseline SCr ~1.7, current 7.16.  Plan: Cefepime 1g IV Q24H, adjust if renal function improves or renal therapy initiated.  Height: 6\' 1"  (185.4 cm) Weight: 220 lb 14.4 oz (100.2 kg) IBW/kg (Calculated) : 79.9  Temp (24hrs), Avg:98 F (36.7 C), Min:98 F (36.7 C), Max:98 F (36.7 C)    Thank you for allowing pharmacy to be a part of this patient's care.  11-06-1973, PharmD, BCPS  06/01/2019 12:34 AM

## 2019-06-01 NOTE — Progress Notes (Signed)
  Echocardiogram 2D Echocardiogram has been performed.  Zachary Parrish 06/01/2019, 9:26 AM

## 2019-06-01 NOTE — Progress Notes (Signed)
Pharmacy Antibiotic Note  Zachary Parrish is a 79 y.o. male transferred from an OSH on 05/31/2019 with shock.  Pharmacy has been consulted for cefepime dosing.  Renal function is improving, afebrile, WBC WNL, LA 1.3, PCT 0.42.  Plan: Change cefepime to 2gm IV Q24H Monitor renal fxn, clinical progress  Height: 6\' 1"  (185.4 cm) Weight: 220 lb 14.4 oz (100.2 kg) IBW/kg (Calculated) : 79.9  Temp (24hrs), Avg:98.1 F (36.7 C), Min:97.9 F (36.6 C), Max:98.4 F (36.9 C)  Recent Labs  Lab 05/31/19 2356 06/01/19 0335  WBC 8.7 8.3  CREATININE 6.49* 6.09*  LATICACIDVEN 1.3  --     Estimated Creatinine Clearance: 12.6 mL/min (A) (by C-G formula based on SCr of 6.09 mg/dL (H)).    Allergies  Allergen Reactions  . Allopurinol     Possible DRESS syndrome    Cefepime 3/18 >>  3/18 MRSA PCR - negative 3/19 BCx -   [place] ED: Cefepime 2gm 3/18 Doxy 3/18 Vanc 2gm 3/18 at 1308  Jamirah Zelaya D. 4/18, PharmD, BCPS, BCCCP 06/01/2019, 8:15 AM

## 2019-06-01 NOTE — Progress Notes (Signed)
[facility]Link Physician-Brief Progress Note Patient Name: Ashtin Rosner DOB: 06-06-1940 MRN: [id number]   Date of Service  06/01/2019  HPI/Events of Note  RN request SSI coverage.  eICU Interventions  Order placed for QACHS coverage.     Intervention Category Intermediate Interventions: Other:  Janae Bridgeman 06/01/2019, 3:44 AM

## 2019-06-01 NOTE — Progress Notes (Signed)
[facility]Link Physician-Brief Progress Note Patient Name: Zachary Parrish DOB: 06/09/1940 MRN: [id number]   Date of Service  06/01/2019  HPI/Events of Note  New patient evaluation.  56M who presented to an OSH with 1 week of worsening malaise and whole body rash and was found to be in shock with multi-organ dysfunction. Transfer to [facility] ICU was requested .  On arrival, the patient relayed a history of recently starting colchicine and allopurinol in the preceding weeks for treatment of gout.  This, in combination with his eosinophilia, elevated LFTs, shock and AKI are strongly consistent with DRESS. Given steroids at the OSH prior to transfer.   eICU Interventions  Plan:  # Derm/Allergy: - High suspicion for DRESS. Treat with hydrocortisone 50mg  Q6H. - Monitor CBC with differential daily, monitoring % and absolute eosinophil counts. These should be near zero with steroid treatment.  # Resp: - Initially was not compensating well for his metabolic acidosis but has since improved with fluid resuscitation and steroids. - F/u repeat chem panel in AM to ensure acidosis continues to improve. Current bicarb level is 14.  # Cardiac: - Wean levophed drip as tolerated. Goal MAP 65 mmHg. - Stress dose seroids for DRESS. - No evidence of myocardial injury 2/2 eosinophil infiltration based on normal troponin level. - Echo in AM.  # GI: - Elevated LFTs c/w DRESS. - Repeat LFTs and coags in AM.  # Heme: - Thrombocytopenia is likely secondary to critical illness. If ongoing or worsening, consider Heme consult.  # Renal: - AKI secondary to some combination of shock/hypovolemia and eosinophilic infiltration. Ongoing severe metabolic acidosis. - IVF hydration with sodium bicarbonate infusion. - Renal consult in AM if not significantly improved.  # ID: - Doubt sepsis as etiology, but cannot rule out. Continue cefepime. Hold vanc given AKI and low suspicion for MRSA. - F/u cultures.  DVT PPX: Heparin [place]  Q8H GI PPX: Famotidine PO (high dose steroids) Code Status: Full     Intervention Category Evaluation Type: New Patient Evaluation  Sterling Ucci 06/01/2019, 1:52 AM

## 2019-06-02 DIAGNOSIS — R21 Rash and other nonspecific skin eruption: Secondary | ICD-10-CM

## 2019-06-02 DIAGNOSIS — R748 Abnormal levels of other serum enzymes: Secondary | ICD-10-CM

## 2019-06-02 LAB — CBC WITH DIFFERENTIAL/PLATELET
Abs Immature Granulocytes: 0.15 10*3/uL — ABNORMAL HIGH (ref 0.00–0.07)
Basophils Absolute: 0.1 10*3/uL (ref 0.0–0.1)
Basophils Relative: 1 %
Eosinophils Absolute: 0 10*3/uL (ref 0.0–0.5)
Eosinophils Relative: 0 %
HCT: 25.2 % — ABNORMAL LOW (ref 39.0–52.0)
Hemoglobin: 8.3 g/dL — ABNORMAL LOW (ref 13.0–17.0)
Immature Granulocytes: 2 %
Lymphocytes Relative: 35 %
Lymphs Abs: 2.6 10*3/uL (ref 0.7–4.0)
MCH: 31.8 pg (ref 26.0–34.0)
MCHC: 32.9 g/dL (ref 30.0–36.0)
MCV: 96.6 fL (ref 80.0–100.0)
Monocytes Absolute: 0.5 10*3/uL (ref 0.1–1.0)
Monocytes Relative: 7 %
Neutro Abs: 4.2 10*3/uL (ref 1.7–7.7)
Neutrophils Relative %: 55 %
Platelets: 115 10*3/uL — ABNORMAL LOW (ref 150–400)
RBC: 2.61 MIL/uL — ABNORMAL LOW (ref 4.22–5.81)
RDW: 17.3 % — ABNORMAL HIGH (ref 11.5–15.5)
WBC: 7.6 10*3/uL (ref 4.0–10.5)
nRBC: 0.5 % — ABNORMAL HIGH (ref 0.0–0.2)

## 2019-06-02 LAB — COMPREHENSIVE METABOLIC PANEL
ALT: 113 U/L — ABNORMAL HIGH (ref 0–44)
ALT: 113 U/L — ABNORMAL HIGH (ref 0–44)
AST: 96 U/L — ABNORMAL HIGH (ref 15–41)
AST: 102 U/L — ABNORMAL HIGH (ref 15–41)
Albumin: 2.5 g/dL — ABNORMAL LOW (ref 3.5–5.0)
Albumin: 2.4 g/dL — ABNORMAL LOW (ref 3.5–5.0)
Alkaline Phosphatase: 193 U/L — ABNORMAL HIGH (ref 38–126)
Alkaline Phosphatase: 191 U/L — ABNORMAL HIGH (ref 38–126)
Anion gap: 16 — ABNORMAL HIGH (ref 5–15)
Anion gap: 10 (ref 5–15)
BUN: 115 mg/dL — ABNORMAL HIGH (ref 8–23)
BUN: 104 mg/dL — ABNORMAL HIGH (ref 8–23)
CO2: 17 mmol/L — ABNORMAL LOW (ref 22–32)
CO2: 22 mmol/L (ref 22–32)
Calcium: 7.6 mg/dL — ABNORMAL LOW (ref 8.9–10.3)
Calcium: 7.4 mg/dL — ABNORMAL LOW (ref 8.9–10.3)
Chloride: 108 mmol/L (ref 98–111)
Chloride: 109 mmol/L (ref 98–111)
Creatinine, Ser: 5.26 mg/dL — ABNORMAL HIGH (ref 0.61–1.24)
Creatinine, Ser: 4.41 mg/dL — ABNORMAL HIGH (ref 0.61–1.24)
GFR calc Af Amer: 11 mL/min — ABNORMAL LOW (ref >60)
GFR calc Af Amer: 14 mL/min — ABNORMAL LOW (ref >60)
GFR calc non Af Amer: 10 mL/min — ABNORMAL LOW (ref >60)
GFR calc non Af Amer: 12 mL/min — ABNORMAL LOW (ref >60)
Glucose, Bld: 215 mg/dL — ABNORMAL HIGH (ref 70–99)
Glucose, Bld: 241 mg/dL — ABNORMAL HIGH (ref 70–99)
Potassium: 4.5 mmol/L (ref 3.5–5.1)
Potassium: 4.8 mmol/L (ref 3.5–5.1)
Sodium: 141 mmol/L (ref 135–145)
Sodium: 141 mmol/L (ref 135–145)
Total Bilirubin: 2.1 mg/dL — ABNORMAL HIGH (ref 0.3–1.2)
Total Bilirubin: 2.2 mg/dL — ABNORMAL HIGH (ref 0.3–1.2)
Total Protein: 5.4 g/dL — ABNORMAL LOW (ref 6.5–8.1)
Total Protein: 5.3 g/dL — ABNORMAL LOW (ref 6.5–8.1)

## 2019-06-02 LAB — GLUCOSE, CAPILLARY
Glucose-Capillary: 208 mg/dL — ABNORMAL HIGH (ref 70–99)
Glucose-Capillary: 220 mg/dL — ABNORMAL HIGH (ref 70–99)
Glucose-Capillary: 221 mg/dL — ABNORMAL HIGH (ref 70–99)
Glucose-Capillary: 229 mg/dL — ABNORMAL HIGH (ref 70–99)

## 2019-06-02 MED ORDER — SODIUM CHLORIDE 0.9 % IV BOLUS
1500.0000 mL | Freq: Once | INTRAVENOUS | Status: AC
  Administered 2019-06-02: 1500 mL via INTRAVENOUS

## 2019-06-02 NOTE — Progress Notes (Signed)
[facility]  PROGRESS NOTE  Zachary Parrish SWN:[id number] DOB: [birth year] DOA: 05/31/2019 PCP: Verl Bangs, MD   Brief HPI:   79 year old male with a history of diabetes mellitus type 2, gout, hypertension admitted with rash, presented multiorgan involvement/dress syndrome secondary to allopurinol or colchicine.  Also presented with acute kidney injury and dehydration.  Patient was started on bicarb infusion.  He was initially started on IV antibiotics which was stopped on 06/01/2019.  Renal function slowly improving. Patient was transferred from ICU, [facility] assumed care on 06/02/2019   Subjective   Patient seen and examined, rashes slowly improving.  Still has mild itching.   Assessment/Plan:     1. DRESS syndrome-patient presented with rash, eosinophilia, multiorgan involvement.  Scores 4 on registry of severe cutaneous adverse reactions scoring system, indicates probable DRESS.  Likely culprit is allopurinol or colchicine.  Both medication was started 5 to 6 weeks ago.  Patch testing is unreliable positive in cases of allopurinol induced DRESS.  Continue prednisone 50 mg daily.  He will need a slow taper over 8 to 12 weeks. 2. Acute kidney injury-likely from DRESS as above.  Currently patient on IV hydration with bicarb infusion.  Slow improvement.  Has elevated BUN, likely from steroids.  Will consult nephrology for further management. 3. Transaminitis-shock liver versus DRESS complication.  Liver enzymes are slowly improving.  Follow LFTs in a.m. 4. Diabetes mellitus type 2-hemoglobin A1c is 7.2.  Patient started on sliding scale insulin with NovoLog. 5. Elevated troponin-patient had elevated troponin 0 0.044 at [facility] ED.  Troponin levels checked yesterday are 17 and 18 respectively.   SpO2: 97 %   COVID-19 Labs  No results for input(s): DDIMER, FERRITIN, LDH, CRP in the last 72 hours.  No results found for: SARSCOV2NAA   CBG: Recent Labs  Lab 06/01/19 1144  06/01/19 1655 06/01/19 2151 06/02/19 0737 06/02/19 1150  GLUCAP 169* 197* 219* 208* 220*    CBC: Recent Labs  Lab 05/31/19 2340 05/31/19 2356 06/01/19 0335 06/02/19 0459  WBC  --  8.7 8.3 7.6  NEUTROABS  --   --  3.4 4.2  HGB 8.5* 8.5* 8.1* 8.3*  HCT 25.0* 26.5* 25.5* 25.2*  MCV  --  100.4* 101.2* 96.6  PLT  --  97* 99* 115*    Basic Metabolic Panel: Recent Labs  Lab 05/31/19 2356 06/01/19 0335 06/01/19 1230 06/01/19 1711 06/02/19 0459  NA 138 139 140 139 141  K 5.2* 5.1 4.8 4.7 4.5  CL 110 109 109 107 108  CO2 14* 16* 19* 18* 17*  GLUCOSE 186* 176* 187* 226* 215*  BUN 121* 115* 110* 114* 115*  CREATININE 6.49* 6.09* 5.70* 5.68* 5.26*  CALCIUM 7.2* 7.3* 7.4* 7.4* 7.6*  MG 1.7 1.8  --   --   --   PHOS 6.0* 6.1*  --   --   --      Liver Function Tests: Recent Labs  Lab 05/31/19 2356 06/01/19 0335 06/01/19 1230 06/01/19 1711 06/02/19 0459  AST 122* 111* 98* 99* 96*  ALT 124* 116* 113* 113* 113*  ALKPHOS 178* 175* 186* 196* 193*  BILITOT 2.4* 2.5* 1.9* 2.3* 2.1*  PROT 5.3* 5.1* 5.4* 5.3* 5.4*  ALBUMIN 2.5* 2.4* 2.3* 2.4* 2.5*        DVT prophylaxis: Heparin  Code Status: Full code  Family Communication: No family at bedside  Disposition Plan: Patient admitted with dress syndrome.  Barrier to discharge-still has acute kidney injury, creatinine slowly improving.  Nephrology has  been consulted.         Scheduled medications:  . famotidine  20 mg Oral Daily  . heparin  5,000 Units Subcutaneous Q8H  . insulin aspart  0-5 Units Subcutaneous QHS  . insulin aspart  0-9 Units Subcutaneous TID WC  . predniSONE  50 mg Oral Q breakfast    Consultants:    Procedures:    Antibiotics:   Anti-infectives (From admission, onward)   Start     Dose/Rate Route Frequency Ordered Stop   06/01/19 1400  ceFEPIme (MAXIPIME) 1 g in sodium chloride 0.9 % 100 mL IVPB  Status:  Discontinued     1 g 200 mL/hr over 30 Minutes Intravenous Every 24 hours  06/01/19 0037 06/01/19 0816   06/01/19 1300  ceFEPIme (MAXIPIME) 2 g in sodium chloride 0.9 % 100 mL IVPB  Status:  Discontinued     2 g 200 mL/hr over 30 Minutes Intravenous Every 24 hours 06/01/19 0816 06/01/19 1043       Objective   Vitals:   06/01/19 2300 06/02/19 0501 06/02/19 0800 06/02/19 1153  BP:  127/75  140/65  Pulse:  61  70  Resp:  (!) 28 18 18   Temp:  (!) 97.5 F (36.4 C)  97.6 F (36.4 C)  TempSrc:  Oral  Oral  SpO2: 96% 98%  97%  Weight:  101.5 kg    Height:        Intake/Output Summary (Last 24 hours) at 06/02/2019 1454 Last data filed at 06/02/2019 1200 Gross per 24 hour  Intake 2176.56 ml  Output 575 ml  Net 1601.56 ml    03/18 1901 - 03/20 0700 In: 3644.8 [P.O.:360; I.V.:3284.8] Out: 921 [Urine:920]  Filed Weights   05/31/19 2219 06/02/19 0501  Weight: 100.2 kg 101.5 kg    Physical Examination:   General-appears in no acute distress Heart-S1-S2, regular, no murmur auscultated Lungs-clear to auscultation bilaterally, no wheezing or crackles auscultated Abdomen-soft, nontender, no organomegaly Extremities-no edema in the lower extremities Neuro-alert, oriented x3, no focal deficit noted Skin-faint maculopapular rash noted on the abdomen, upper extremities, lower extremities   Data Reviewed:   Recent Results (from the past 240 hour(s))  MRSA PCR Screening     Status: None   Collection Time: 05/31/19 11:56 PM   Specimen: Nasal Mucosa; Nasopharyngeal  Result Value Ref Range Status   MRSA by PCR NEGATIVE NEGATIVE Final    Comment:        The GeneXpert MRSA Assay (FDA approved for NASAL specimens only), is one component of a comprehensive MRSA colonization surveillance program. It is not intended to diagnose MRSA infection nor to guide or monitor treatment for MRSA infections. Performed at [facility], [address], [place], [place]   Culture, blood (Routine X 2) w Reflex to ID Panel     Status: None (Preliminary  result)   Collection Time: 06/01/19 12:22 AM   Specimen: BLOOD  Result Value Ref Range Status   Specimen Description BLOOD LEFT ANTECUBITAL  Final   Special Requests   Final    BOTTLES DRAWN AEROBIC AND ANAEROBIC Blood Culture adequate volume   Culture   Final    NO GROWTH 1 DAY Performed at [facility], [address], [place], [place]    Report Status PENDING  Incomplete  Culture, blood (Routine X 2) w Reflex to ID Panel     Status: None (Preliminary result)   Collection Time: 06/01/19 12:28 AM  Specimen: BLOOD LEFT HAND  Result Value Ref Range Status   Specimen Description BLOOD LEFT HAND  Final   Special Requests   Final    BOTTLES DRAWN AEROBIC ONLY Blood Culture adequate volume   Culture   Final    NO GROWTH 1 DAY Performed at [facility], [address] [place], [place] [id number]    Report Status PENDING  Incomplete    Recent Labs  Lab 05/31/19 2356  LIPASE 100*  AMYLASE 51   No results for input(s): AMMONIA in the last 168 hours.  Cardiac Enzymes: No results for input(s): CKTOTAL, CKMB, CKMBINDEX, TROPONINI in the last 168 hours. BNP (last 3 results) No results for input(s): BNP in the last 8760 hours.  ProBNP (last 3 results) No results for input(s): PROBNP in the last 8760 hours.  Studies:  DG Chest Port 1 View  Result Date: 05/31/2019 CLINICAL DATA:  Dyspnea, shortness of breath, cough EXAM: PORTABLE CHEST 1 VIEW COMPARISON:  None. FINDINGS: Right IJ approach central venous catheter tip terminates near the superior cavoatrial junction. There is mild cardiomegaly though this may be accentuated by portable technique. Calcified tortuous aorta. Low lung volumes with streaky opacities in the bases and retrocardiac space favoring atelectatic change. No pneumothorax. No effusion. Vascular calcium noted in the axilla. Telemetry leads overlie the chest. No acute osseous or soft tissue abnormality. IMPRESSION: Low volumes and basilar  atelectasis. Central venous catheter tip at the superior cavoatrial junction. Mild cardiomegaly. Aortic Atherosclerosis (ICD10-I70.0). Electronically Signed   By: Kreg ShropshirePrice  DeHay M.D.   On: 05/31/2019 23:28   ECHOCARDIOGRAM COMPLETE  Result Date: 06/01/2019    ECHOCARDIOGRAM REPORT   Patient Name:   Carolin CoyCLAXTON Gow Date of Exam: 06/01/2019 Medical Rec #:  [id number]    Height:       73.0 in Accession #:    [id number]   Weight:       220.9 lb Date of Birth:  11/22/1940     BSA:          2.244 m Patient Age:    78 years     BP:           108/70 mmHg Patient Gender: M            HR:           70 bpm. Exam Location:  Inpatient Procedure: 2D Echo, Cardiac Doppler and Color Doppler Indications:   Shock  History:       Patient has no prior history of Echocardiogram examinations.                Shock.  Sonographer:   Renella CunasJulia Swaim RDCS Referring      [phone] Clarene CritchleyUL W [facility] Phys: IMPRESSIONS  1. Left ventricular ejection fraction, by estimation, is 55 to 60%. The left ventricle has normal function. The left ventricle has no regional wall motion abnormalities. There is moderate asymmetric left ventricular hypertrophy of the basal-septal segment. LV diastolic function is grossly normal. Suboptimal assessment of medial tissue Doppler for assessment of LV filling pressure.  2. Right ventricular systolic function is normal. The right ventricular size is normal. Tricuspid regurgitation signal is inadequate for assessing PA pressure.  3. The mitral valve is normal in structure. Trivial mitral valve regurgitation. No evidence of mitral stenosis.  4. The aortic valve is mildly calcified. Aortic valve regurgitation is not visualized. No aortic stenosis is present. FINDINGS  Left Ventricle: Left ventricular ejection fraction, by estimation, is 55 to 60%.  The left ventricle has normal function. The left ventricle has no regional wall motion abnormalities. The left ventricular internal cavity size was normal in size. There is  moderate asymmetric  left ventricular hypertrophy of the basal-septal segment. LV diastolic function is grossly normal. Suboptimal assessment of medial tissue Doppler for assessment of LV filling pressure. Right Ventricle: The right ventricular size is normal. No increase in right ventricular wall thickness. Right ventricular systolic function is normal. Tricuspid regurgitation signal is inadequate for assessing PA pressure. Left Atrium: Left atrial size was normal in size. Right Atrium: Right atrial size was normal in size. Pericardium: There is no evidence of pericardial effusion. Presence of epicaridal fat pad. Mitral Valve: The mitral valve is normal in structure. Normal mobility of the mitral valve leaflets. Trivial mitral valve regurgitation. No evidence of mitral valve stenosis. Tricuspid Valve: The tricuspid valve is normal in structure. Tricuspid valve regurgitation is trivial. No evidence of tricuspid stenosis. Aortic Valve: The aortic valve is grossly normal. Aortic valve regurgitation is not visualized. No aortic stenosis is present. There is mild calcification of the aortic valve. Pulmonic Valve: The pulmonic valve was normal in structure. Pulmonic valve regurgitation is trivial. No evidence of pulmonic stenosis. Aorta: The aortic root and ascending aorta are structurally normal, with no evidence of dilitation. Measurement of 37 mm may be within normal limits when indexed to body surface area for age. Venous: The inferior vena cava was not well visualized. IAS/Shunts: No atrial level shunt detected by color flow Doppler.  LEFT VENTRICLE PLAX 2D LVIDd:         4.60 cm  Diastology LVIDs:         3.40 cm  LV e' lateral:   8.63 cm/s LV PW:         1.00 cm  LV E/e' lateral: 8.9 LV IVS:        1.40 cm  LV e' medial:    4.95 cm/s LVOT diam:     2.00 cm  LV E/e' medial:  15.6 LV SV:         72 LV SV Index:   32 LVOT Area:     3.14 cm  RIGHT VENTRICLE RV S prime:     10.80 cm/s TAPSE (M-mode): 1.6 cm LEFT ATRIUM             Index        RIGHT ATRIUM           Index LA diam:        4.80 cm 2.14 cm/m  RA Area:     10.20 cm LA Vol (A2C):   59.9 ml 26.69 ml/m RA Volume:   21.40 ml  9.53 ml/m LA Vol (A4C):   66.2 ml 29.49 ml/m LA Biplane Vol: 66.2 ml 29.49 ml/m  AORTIC VALVE LVOT Vmax:   92.00 cm/s LVOT Vmean:  57.300 cm/s LVOT VTI:    0.230 m  AORTA Ao Root diam: 3.70 cm Ao Asc diam:  3.70 cm MITRAL VALVE MV Area (PHT): 3.08 cm    SHUNTS MV Decel Time: 246 msec    Systemic VTI:  0.23 m MV E velocity: 77.10 cm/s  Systemic Diam: 2.00 cm MV A velocity: 59.10 cm/s MV E/A ratio:  1.30 Weston Brass MD Electronically signed by Weston Brass MD Signature Date/Time: 06/01/2019/1:04:21 PM    Final      Admission status: The appropriate admission status for this patient is INPATIENT. Inpatient status is judged to be reasonable and necessary in  order to provide the required intensity of service to ensure the patient's safety. The patient's presenting symptoms, physical exam findings, and initial radiographic and laboratory data in the context of their chronic comorbidities is felt to place them at high risk for further clinical deterioration. Furthermore, it is not anticipated that the patient will be medically stable for discharge from the hospital within 2 midnights of admission. The following factors support the admission status of inpatient.    The patient's presenting symptoms include dress syndrome The worrisome physical exam findings include diffuse maculopapular rash. The initial radiographic and laboratory data are worrisome because of eosinophilia, acute kidney injury, transaminitis. The chronic co-morbidities include hypertension.    * I certify that at the point of admission it is my clinical judgment that the patient will require inpatient hospital care spanning beyond 2 midnights from the point of admission due to high intensity of service, high risk for further deterioration and high frequency of surveillance  required.Oswald Hillock   [facility] If 7PM-7AM, please contact night-coverage at [email or web address], Office  [phone]   06/02/2019, 2:54 PM  LOS: 2 days

## 2019-06-02 NOTE — Consult Note (Signed)
Renal Service Consult Note [facility]  Zachary Parrish 06/02/2019 Sol Blazing Requesting Physician:  Dr Darrick Meigs  Reason for Consult:  Renal failure HPI: The patient is a 79 y.o. year-old with hx of HTN, gout and DM2 (no longer on medication) who presented to OSH on 3/18 w/ malaise and a whole body rash w/ hypotension.  He was started on pressors and admitted to [facility]. At [facility] CCM saw pt and dx'd DRESS, IV steroids were started and pressors were stopped yesterday.  They found out that pt had recently started colchicine and allopurinol for gout.   Patient's creat on admit was 6.49, down to 6.1/ 5.6 on 3/19, and down to 5.2 / 4.4 today. UOP today is 700 cc so far.  Asked to see for acute renal failure.   BP's here are normal 110- 140 syst range.  On RA 95%. BLood cx's are neg. No fever here. LFT"s are up AST 102/ ALT 113 today, tbili 2.2.  Not getting any BP medications now, getting po pred 50 mg qd and IV bicarb gtt at 75 /hr.   No old labs available here. Pt denies any hx of kidney disease. No voiding issues. Rash is "clearing up".  Pt states he was really sick and got "severely dehydrated" but now is taking fluids again.    ROS  denies CP  no joint pain   no HA  no blurry vision  no rash  no diarrhea  no nausea/ vomiting  no dysuria  no difficulty voiding  no change in urine color    Past Medical History  Past Medical History:  Diagnosis Date  . DM (diabetes mellitus) ([place])    no longer on medications  . Gout   . HTN (hypertension)    Past Surgical History The histories are not reviewed yet. Please review them in the "History" navigator section and refresh this [place]. Family History No family history on file. Social History  has no history on file for tobacco, alcohol, and drug. Allergies  Allergies  Allergen Reactions  . Allopurinol     Possible DRESS syndrome  . Colchicine     Possible DRESS syndrome   Home medications Prior to Admission medications    Medication Sig Start Date End Date Taking? Authorizing Provider  aspirin 81 MG chewable tablet Chew 81 mg by mouth daily.    Yes [provider]  atorvastatin (LIPITOR) 40 MG tablet Take 40 mg by mouth daily. 03/27/19  Yes [provider]  carvedilol (COREG) 25 MG tablet Take 25 mg by mouth 2 (two) times daily. 04/11/19  Yes [provider]  Coenzyme Q10 300 MG CAPS Take 1 capsule by mouth daily.   Yes [provider]  cyanocobalamin 1000 MCG tablet Take 1 tablet by mouth daily. 01/02/19  Yes [provider]  furosemide (LASIX) 40 MG tablet Take 40 mg by mouth daily. 04/02/19  Yes [provider]  KLOR-CON M20 20 MEQ tablet Take 10 mEq by mouth daily.  01/09/19  Yes [provider]  Omega-3 Fatty Acids (FISH OIL) 1000 MG CAPS Take 2 capsules by mouth daily.   Yes [provider]  spironolactone (ALDACTONE) 25 MG tablet Take 25 mg by mouth daily. 05/29/19  Yes [provider]  telmisartan (MICARDIS) 80 MG tablet Take 80 mg by mouth daily. 05/27/19  Yes [provider]     Vitals:   06/02/19 0501 06/02/19 0800 06/02/19 1153 06/02/19 1638  BP: 127/75  140/65 110/69  Pulse: 61  70 69  Resp: (!) 28 18 18 18   Temp: (!) 97.5 F (36.4 C)  97.6 F (36.4 C) 98.1 F (36.7 C)  TempSrc: Oral  Oral Oral  SpO2: 98%  97% 97%  Weight: 101.5 kg     Height:       Exam Gen elderly pleasant WM, no distress, calm Diffuse mild-mod maculopapular skin rash over torso and all ext Sclera anicteric, throat clear No jvd or bruits Chest clear bilat to bases , no rales RRR no MRG Abd soft ntnd no mass or ascites +bs GU normal male MS no joint effusions or deformity Ext no LE or UE edema, no wounds or ulcers Neuro is alert, Ox 3 , nf    Home meds:  - telmisartan 80 qd/ furosemide 40 qd/ aldactone 25 qd/ carvedilol 25 bid  - aspirin 81/ lipitor 40  - KCL 10 qd  - prn's/ vitamins/ supplements    UA 3/19 > negative,  100 prot    CXR 05/31/19 > no acute disease    ECHO 3/19 > LVEF 55-60%.  Mod severe LVH. No diast dysfxn. RV appears normal. No sig valve issues.    Assessment/ Plan: 1. Renal failure - presumably acute, we have no old creat labs in the system or outside.  Renal failure/ ^'d LFTs' and severe skin rash is noted to occur w/ allopurinol.  Vol depletion may be contributing to this AKI.  UA is unremarkable. Doubt GN.  Needs renal US. NO uremic s/s. Recommend cont supportive care, IVF"s, daily labs. Will follow.  2. DRESS syndrome - c/w allopurinol which has been stopped. On prednisone po. Improving.  3. HTN - no BP meds here, BP's wnl. Avoid ACEi/ ARB''s/ diuretics for this pt w/ AKI and vol depletion.  4. Met acidosis - cont bicarb gtt 5. Anemia - per pmd , Hb 8.3 6. Vol depletion - has room for volume, will bolus 1.5 L and ^ IVF to 100/hr.       Rob Branna Cortina  MD 06/02/2019, 5:59 PM  Recent Labs  Lab 06/01/19 0335 06/02/19 0459  WBC 8.3 7.6  HGB 8.1* 8.3*   Recent Labs  Lab 05/31/19 2356 05/31/19 2356 06/01/19 0335 06/01/19 0335 06/01/19 1230 06/01/19 1230 06/01/19 1711 06/01/19 1711 06/02/19 0459 06/02/19 1625  K 5.2*   < > 5.1   < > 4.8   < > 4.7   < > 4.5 4.8  BUN 121*   < > 115*   < > 110*   < > 114*   < > 115* 104*  CREATININE 6.49*   < > 6.09*   < > 5.70*   < > 5.68*   < > 5.26* 4.41*  CALCIUM 7.2*  --  7.3*  --  7.4*  --  7.4*  --  7.6* 7.4*  PHOS 6.0*  --  6.1*  --   --   --   --   --   --   --    < > = values in this interval not displayed.

## 2019-06-03 ENCOUNTER — Inpatient Hospital Stay (HOSPITAL_COMMUNITY): Payer: Medicare Other

## 2019-06-03 DIAGNOSIS — I480 Paroxysmal atrial fibrillation: Secondary | ICD-10-CM

## 2019-06-03 HISTORY — DX: Paroxysmal atrial fibrillation: I48.0

## 2019-06-03 LAB — COMPREHENSIVE METABOLIC PANEL
ALT: 113 U/L — ABNORMAL HIGH (ref 0–44)
ALT: 128 U/L — ABNORMAL HIGH (ref 0–44)
AST: 99 U/L — ABNORMAL HIGH (ref 15–41)
AST: 112 U/L — ABNORMAL HIGH (ref 15–41)
Albumin: 2.4 g/dL — ABNORMAL LOW (ref 3.5–5.0)
Albumin: 2.4 g/dL — ABNORMAL LOW (ref 3.5–5.0)
Alkaline Phosphatase: 176 U/L — ABNORMAL HIGH (ref 38–126)
Alkaline Phosphatase: 171 U/L — ABNORMAL HIGH (ref 38–126)
Anion gap: 12 (ref 5–15)
Anion gap: 15 (ref 5–15)
BUN: 98 mg/dL — ABNORMAL HIGH (ref 8–23)
BUN: 85 mg/dL — ABNORMAL HIGH (ref 8–23)
CO2: 25 mmol/L (ref 22–32)
CO2: 23 mmol/L (ref 22–32)
Calcium: 7.3 mg/dL — ABNORMAL LOW (ref 8.9–10.3)
Calcium: 7.4 mg/dL — ABNORMAL LOW (ref 8.9–10.3)
Chloride: 103 mmol/L (ref 98–111)
Chloride: 103 mmol/L (ref 98–111)
Creatinine, Ser: 3.96 mg/dL — ABNORMAL HIGH (ref 0.61–1.24)
Creatinine, Ser: 3.32 mg/dL — ABNORMAL HIGH (ref 0.61–1.24)
GFR calc Af Amer: 16 mL/min — ABNORMAL LOW (ref >60)
GFR calc Af Amer: 20 mL/min — ABNORMAL LOW (ref >60)
GFR calc non Af Amer: 14 mL/min — ABNORMAL LOW (ref >60)
GFR calc non Af Amer: 17 mL/min — ABNORMAL LOW (ref >60)
Glucose, Bld: 236 mg/dL — ABNORMAL HIGH (ref 70–99)
Glucose, Bld: 237 mg/dL — ABNORMAL HIGH (ref 70–99)
Potassium: 4.3 mmol/L (ref 3.5–5.1)
Potassium: 4.2 mmol/L (ref 3.5–5.1)
Sodium: 140 mmol/L (ref 135–145)
Sodium: 141 mmol/L (ref 135–145)
Total Bilirubin: 1.7 mg/dL — ABNORMAL HIGH (ref 0.3–1.2)
Total Bilirubin: 2 mg/dL — ABNORMAL HIGH (ref 0.3–1.2)
Total Protein: 5.1 g/dL — ABNORMAL LOW (ref 6.5–8.1)
Total Protein: 5.3 g/dL — ABNORMAL LOW (ref 6.5–8.1)

## 2019-06-03 LAB — BASIC METABOLIC PANEL
Anion gap: 14 (ref 5–15)
BUN: 94 mg/dL — ABNORMAL HIGH (ref 8–23)
CO2: 23 mmol/L (ref 22–32)
Calcium: 7.2 mg/dL — ABNORMAL LOW (ref 8.9–10.3)
Chloride: 104 mmol/L (ref 98–111)
Creatinine, Ser: 3.61 mg/dL — ABNORMAL HIGH (ref 0.61–1.24)
GFR calc Af Amer: 18 mL/min — ABNORMAL LOW (ref >60)
GFR calc non Af Amer: 15 mL/min — ABNORMAL LOW (ref >60)
Glucose, Bld: 198 mg/dL — ABNORMAL HIGH (ref 70–99)
Potassium: 4.6 mmol/L (ref 3.5–5.1)
Sodium: 141 mmol/L (ref 135–145)

## 2019-06-03 LAB — CBC WITH DIFFERENTIAL/PLATELET
Abs Immature Granulocytes: 0 10*3/uL (ref 0.00–0.07)
Basophils Absolute: 0.1 10*3/uL (ref 0.0–0.1)
Basophils Relative: 1 %
Eosinophils Absolute: 0.1 10*3/uL (ref 0.0–0.5)
Eosinophils Relative: 1 %
HCT: 23.1 % — ABNORMAL LOW (ref 39.0–52.0)
Hemoglobin: 7.7 g/dL — ABNORMAL LOW (ref 13.0–17.0)
Lymphocytes Relative: 12 %
Lymphs Abs: 0.8 10*3/uL (ref 0.7–4.0)
MCH: 32.2 pg (ref 26.0–34.0)
MCHC: 33.3 g/dL (ref 30.0–36.0)
MCV: 96.7 fL (ref 80.0–100.0)
Monocytes Absolute: 0.6 10*3/uL (ref 0.1–1.0)
Monocytes Relative: 9 %
Neutro Abs: 5.3 10*3/uL (ref 1.7–7.7)
Neutrophils Relative %: 77 %
Platelets: 135 10*3/uL — ABNORMAL LOW (ref 150–400)
RBC: 2.39 MIL/uL — ABNORMAL LOW (ref 4.22–5.81)
RDW: 17.4 % — ABNORMAL HIGH (ref 11.5–15.5)
WBC: 6.9 10*3/uL (ref 4.0–10.5)
nRBC: 0.9 % — ABNORMAL HIGH (ref 0.0–0.2)
nRBC: 2 /100 WBC — ABNORMAL HIGH

## 2019-06-03 LAB — MAGNESIUM: Magnesium: 1.9 mg/dL (ref 1.7–2.4)

## 2019-06-03 LAB — GLUCOSE, CAPILLARY
Glucose-Capillary: 190 mg/dL — ABNORMAL HIGH (ref 70–99)
Glucose-Capillary: 183 mg/dL — ABNORMAL HIGH (ref 70–99)
Glucose-Capillary: 261 mg/dL — ABNORMAL HIGH (ref 70–99)
Glucose-Capillary: 232 mg/dL — ABNORMAL HIGH (ref 70–99)

## 2019-06-03 MED ORDER — GENERIC EXTERNAL MEDICATION
Status: DC
Start: ? — End: 2019-06-03

## 2019-06-03 MED ORDER — LACTATED RINGERS IV BOLUS
1000.0000 mL | Freq: Once | INTRAVENOUS | Status: AC
  Administered 2019-06-03: 1000 mL via INTRAVENOUS

## 2019-06-03 MED ORDER — DILTIAZEM HCL 25 MG/5ML IV SOLN
5.0000 mg | Freq: Once | INTRAVENOUS | Status: AC
  Administered 2019-06-03: 5 mg via INTRAVENOUS
  Filled 2019-06-03 (×2): qty 5

## 2019-06-03 MED ORDER — METOPROLOL TARTRATE 5 MG/5ML IV SOLN
5.0000 mg | INTRAVENOUS | Status: AC | PRN
  Administered 2019-06-03 – 2019-06-05 (×2): 5 mg via INTRAVENOUS
  Filled 2019-06-03 (×3): qty 5

## 2019-06-03 NOTE — Progress Notes (Signed)
[facility] Progress Note  Subjective: doing well, creat down to 3.96 this am, good UOP, pt is w/o complaints.   Vitals:   06/02/19 1153 06/02/19 1638 06/02/19 2009 06/03/19 0318  BP: 140/65 110/69 (!) 141/62 (!) 145/76  Pulse: 70 69 67 70  Resp: _0 (!) 23  Temp: 97.6 F (36.4 C) 98.1 F (36.7 C) 98 F (36.7 C) 98 F (36.7 C)  TempSrc: Oral Oral Oral Oral  SpO2: 97% 97% 97% 92%  Weight:    104.2 kg  Height:        Exam: Gen elderly pleasant WM, no distress, calm Diffuse mild-mod maculopapular skin rash x 4 ext + torso No jvd  Chest clear bilat  RRR no MRG Abd soft ntnd no mass or ascites +bs Ext no LE edema Neuro is alert, Ox 3 , nf    Home meds:  - telmisartan 80 qd/ furosemide 40 qd/ aldactone 25 qd/ carvedilol 25 bid  - aspirin 81/ lipitor 40  - KCL 10 qd  - prn's/ vitamins/ supplements    UA 3/19 > negative, 100 prot    CXR 05/31/19 > no acute disease    ECHO 3/19 > LVEF 55-60%.  Mod severe LVH. No diast dysfxn. RV appears normal. No sig valve issues.    Renal US > 10.1/ 11.4 cm kidneys w/o hydro, normal echo   Assessment/ Plan: 1. Renal failure - presumably acute, we have no old creat labs in the system or outside. Suspect AIN related to DRESS as allergy to allopurinol most likely. UA and US unremarkable. Admit creat 6.49 >>> sig improvement down to 3.9 today. Will lower IVF"s 75 cc/ hr and cont for another day or so. If continues to improve can likely dc 24-48 hrs from renal standpoint.  2. DRESS syndrome - c/w allopurinol which has been stopped. On prednisone po. Improving.  3. HTN - no BP meds here, BP's wnl. Avoid ACEi/ ARB''s/ diuretics for this pt w/ AKI.  4. Met acidosis - on bicarb gtt 5. Anemia - per pmd , Hb 7's, transfuse prn      Rob Seretha Estabrooks 06/03/2019, 11:23 AM   Recent Labs  Lab 05/31/19 2356 05/31/19 2356 06/01/19 0335 06/01/19 1230 06/02/19 0459 06/02/19 0459 06/02/19 1625 06/03/19 0340  K 5.2*   < >  5.1   < > 4.5   < > 4.8 4.3  BUN 121*   < > 115*   < > 115*   < > 104* 98*  CREATININE 6.49*   < > 6.09*   < > 5.26*   < > 4.41* 3.96*  CALCIUM 7.2*   < > 7.3*   < > 7.6*   < > 7.4* 7.3*  PHOS 6.0*  --  6.1*  --   --   --   --   --   HGB 8.5*   < > 8.1*  --  8.3*  --   --  7.7*   < > = values in this interval not displayed.   Inpatient medications: . famotidine  20 mg Oral Daily  . heparin  5,000 Units Subcutaneous Q8H  . insulin aspart  0-5 Units Subcutaneous QHS  . insulin aspart  0-9 Units Subcutaneous TID WC  . predniSONE  50 mg Oral Q breakfast   . sodium chloride Stopped (06/01/19 1341)  .  sodium bicarbonate (isotonic) infusion in sterile water 100 mL/hr at 06/03/19 0655   sodium chloride, diphenhydrAMINE, hydrocerin

## 2019-06-03 NOTE — Progress Notes (Signed)
Reported off from off going RN, pt converted to new onset atrial fibrillation.  On nursing assessment, pt report palpitations, denies any other s/s.  Pt states it may be due to basketball game watching and agitation that IV fluids have not been infusing.  BP 126/67, afib 140s on tele noted.  EKG obtained to confirm and notified [facility] provider on call.  5 mg IV metoprolol given per orders and LR bolus initiated.  O2 sats 88-92% on room air, pt denied SOB, lungs CTA; O2 at 2L per [place] started.  Pt currently afib 115 with occasional unifocal PVCs.  Pt also endorses hx OSA; will request CPAP at HS order as well.  Will cont to monitor pt closely.

## 2019-06-03 NOTE — Progress Notes (Signed)
RT set pts CPAP dream station up with Nasal Mask per pt request. Pt stated he is claustrophobic and is not able to tolerate wearing the CPAP. RT had pt on auto titrate of max 12 min 6 w/2 Lpm bled into the system. RN aware that pt is unable to tolerate. Pt wanted off after less than 10 minutes of wearing. RT expressed to pt if he changes his mind to please have RN call. RT will continue to monitor.

## 2019-06-03 NOTE — Progress Notes (Signed)
[facility]  PROGRESS NOTE  Zachary Parrish OEV:[id number] DOB: [birth year] DOA: 05/31/2019 PCP: Jamesetta Geralds, MD   Brief HPI:   79 year old male with a history of diabetes mellitus type 2, gout, hypertension admitted with rash, presented multiorgan involvement/dress syndrome secondary to allopurinol or colchicine.  Also presented with acute kidney injury and dehydration.  Patient was started on bicarb infusion.  He was initially started on IV antibiotics which was stopped on 06/01/2019.  Renal function slowly improving. Patient was transferred from ICU, [place] assumed care on 06/02/2019   Subjective   Patient seen and examined, feels better today.  Rash has improved.  Renal function slowly improving.   Assessment/Plan:     1. DRESS syndrome-patient presented with rash, eosinophilia, multiorgan involvement.  Scores 4 on registry of severe cutaneous adverse reactions scoring system, indicates probable DRESS.  Likely culprit is allopurinol or colchicine.  Both medication was started 5 to 6 weeks ago.  Patch testing is unreliable positive in cases of allopurinol induced DRESS.  Continue prednisone 50 mg daily.  He will need a slow taper over 8 to 12 weeks. 2. Acute kidney injury-likely from DRESS as above.  Currently patient on IV hydration with bicarb infusion.  Creatinine significantly improved today to 3.96.  Appreciate nephrology input.  3. Transaminitis-shock liver versus DRESS complication.  Liver enzymes are slowly improving.  Follow LFTs in a.m. 4. Diabetes mellitus type 2-hemoglobin A1c is 7.2.  Patient started on sliding scale insulin with NovoLog. 5. Elevated troponin-patient had elevated troponin 0 0.044 at [facility] ED.  Troponin levels checked yesterday are 17 and 18 respectively.   SpO2: 100 %   COVID-19 Labs  No results for input(s): DDIMER, FERRITIN, LDH, CRP in the last 72 hours.  No results found for: SARSCOV2NAA   CBG: Recent Labs  Lab 06/02/19 1150  06/02/19 1636 06/02/19 2127 06/03/19 0758 06/03/19 1151  GLUCAP 220* 221* 229* 190* 183*    CBC: Recent Labs  Lab 05/31/19 2340 05/31/19 2356 06/01/19 0335 06/02/19 0459 06/03/19 0340  WBC  --  8.7 8.3 7.6 6.9  NEUTROABS  --   --  3.4 4.2 5.3  HGB 8.5* 8.5* 8.1* 8.3* 7.7*  HCT 25.0* 26.5* 25.5* 25.2* 23.1*  MCV  --  100.4* 101.2* 96.6 96.7  PLT  --  97* 99* 115* 135*    Basic Metabolic Panel: Recent Labs  Lab 05/31/19 2356 05/31/19 2356 06/01/19 0335 06/01/19 1230 06/01/19 1711 06/02/19 0459 06/02/19 1625 06/03/19 0340 06/03/19 1140  NA 138   < > 139   < > 139 141 141 140 141  K 5.2*   < > 5.1   < > 4.7 4.5 4.8 4.3 4.6  CL 110   < > 109   < > 107 108 109 103 104  CO2 14*   < > 16*   < > 18* 17* 22 25 23   GLUCOSE 186*   < > 176*   < > 226* 215* 241* 236* 198*  BUN 121*   < > 115*   < > 114* 115* 104* 98* 94*  CREATININE 6.49*   < > 6.09*   < > 5.68* 5.26* 4.41* 3.96* 3.61*  CALCIUM 7.2*   < > 7.3*   < > 7.4* 7.6* 7.4* 7.3* 7.2*  MG 1.7  --  1.8  --   --   --   --   --   --   PHOS 6.0*  --  6.1*  --   --   --   --   --   --    < > =  values in this interval not displayed.     Liver Function Tests: Recent Labs  Lab 06/01/19 1230 06/01/19 1711 06/02/19 0459 06/02/19 1625 06/03/19 0340  AST 98* 99* 96* 102* 99*  ALT 113* 113* 113* 113* 113*  ALKPHOS 186* 196* 193* 191* 176*  BILITOT 1.9* 2.3* 2.1* 2.2* 1.7*  PROT 5.4* 5.3* 5.4* 5.3* 5.1*  ALBUMIN 2.3* 2.4* 2.5* 2.4* 2.4*        DVT prophylaxis: Heparin  Code Status: Full code  Family Communication: No family at bedside  Disposition Plan: Patient admitted with dress syndrome.  Barrier to discharge-still has acute kidney injury, creatinine slowly improving.  Nephrology has been consulted.         Scheduled medications:  . famotidine  20 mg Oral Daily  . heparin  5,000 Units Subcutaneous Q8H  . insulin aspart  0-5 Units Subcutaneous QHS  . insulin aspart  0-9 Units Subcutaneous TID WC   . predniSONE  50 mg Oral Q breakfast    Consultants:    Procedures:    Antibiotics:   Anti-infectives (From admission, onward)   Start     Dose/Rate Route Frequency Ordered Stop   06/01/19 1400  ceFEPIme (MAXIPIME) 1 g in sodium chloride 0.9 % 100 mL IVPB  Status:  Discontinued     1 g 200 mL/hr over 30 Minutes Intravenous Every 24 hours 06/01/19 0037 06/01/19 0816   06/01/19 1300  ceFEPIme (MAXIPIME) 2 g in sodium chloride 0.9 % 100 mL IVPB  Status:  Discontinued     2 g 200 mL/hr over 30 Minutes Intravenous Every 24 hours 06/01/19 0816 06/01/19 1043       Objective   Vitals:   06/02/19 1638 06/02/19 2009 06/03/19 0318 06/03/19 1300  BP: 110/69 (!) 141/62 (!) 145/76 136/80  Pulse: 69 67 70 76  Resp: 18 16 (!) 23 17  Temp: 98.1 F (36.7 C) 98 F (36.7 C) 98 F (36.7 C) 98.1 F (36.7 C)  TempSrc: Oral Oral Oral Oral  SpO2: 97% 97% 92% 100%  Weight:   104.2 kg   Height:        Intake/Output Summary (Last 24 hours) at 06/03/2019 1412 Last data filed at 06/03/2019 0900 Gross per 24 hour  Intake 4302.57 ml  Output 1275 ml  Net 3027.57 ml    03/19 1901 - 03/21 0700 In: 6119.1 [P.O.:1182; I.V.:3437.1] Out: 1850 [Urine:1850]  Filed Weights   05/31/19 2219 06/02/19 0501 06/03/19 0318  Weight: 100.2 kg 101.5 kg 104.2 kg    Physical Examination:   General-appears in no acute distress Heart-S1-S2, regular, no murmur auscultated Lungs-clear to auscultation bilaterally, no wheezing or crackles auscultated Abdomen-soft, nontender, no organomegaly Extremities-no edema in the lower extremities Neuro-alert, oriented x3, no focal deficit noted Skin-faint maculopapular rash noted on the upper and lower extremities, trunk.   Data Reviewed:   Recent Results (from the past 240 hour(s))  MRSA PCR Screening     Status: None   Collection Time: 05/31/19 11:56 PM   Specimen: Nasal Mucosa; Nasopharyngeal  Result Value Ref Range Status   MRSA by PCR NEGATIVE NEGATIVE  Final    Comment:        The GeneXpert MRSA Assay (FDA approved for NASAL specimens only), is one component of a comprehensive MRSA colonization surveillance program. It is not intended to diagnose MRSA infection nor to guide or monitor treatment for MRSA infections. Performed at [facility] Lab, [address] [place], [place] [address]   Culture,  blood (Routine X 2) w Reflex to ID Panel     Status: None (Preliminary result)   Collection Time: 06/01/19 12:22 AM   Specimen: BLOOD  Result Value Ref Range Status   Specimen Description BLOOD LEFT ANTECUBITAL  Final   Special Requests   Final    BOTTLES DRAWN AEROBIC AND ANAEROBIC Blood Culture adequate volume   Culture   Final    NO GROWTH 2 DAYS Performed at [facility], [address] [place], [place] [address]    Report Status PENDING  Incomplete  Culture, blood (Routine X 2) w Reflex to ID Panel     Status: None (Preliminary result)   Collection Time: 06/01/19 12:28 AM   Specimen: BLOOD LEFT HAND  Result Value Ref Range Status   Specimen Description BLOOD LEFT HAND  Final   Special Requests   Final    BOTTLES DRAWN AEROBIC ONLY Blood Culture adequate volume   Culture   Final    NO GROWTH 2 DAYS Performed at [facility], [address], [place], [place] [address]    Report Status PENDING  Incomplete    Recent Labs  Lab 05/31/19 2356  LIPASE 100*  AMYLASE 51   No results for input(s): AMMONIA in the last 168 hours.  Cardiac Enzymes: No results for input(s): CKTOTAL, CKMB, CKMBINDEX, TROPONINI in the last 168 hours. BNP (last 3 results) No results for input(s): BNP in the last 8760 hours.  ProBNP (last 3 results) No results for input(s): PROBNP in the last 8760 hours.  Studies:  US RENAL  Result Date: 06/26/19 CLINICAL DATA:  Elevated BUN and creatinine. EXAM: RENAL / URINARY TRACT ULTRASOUND COMPLETE COMPARISON:  None. FINDINGS: Right Kidney: Renal measurements: 10.1 x 6.0 x 5.4 cm =  volume: 169.0 mL . Echogenicity within normal limits. No mass or hydronephrosis visualized. Left Kidney: Renal measurements: 11.4 x 4.8 x 5.3 cm = volume: 149.9 mL. Contains a small cyst in the lower pole. Bladder: The bladder is poorly distended limiting evaluation. The wall appears prominent but this could be due to lack of distention. Other: None. IMPRESSION: 1. The kidneys are normal in appearance. There is a small cyst in the lower pole on the left. 2. Poor evaluation of the bladder due to lack of distention. Bladder wall prominence is nonspecific but may be due to lack of distention. Electronically Signed   By: Gerome Sam III M.D   On: 06/26/19 05:07     Admission status: The appropriate admission status for this patient is INPATIENT. Inpatient status is judged to be reasonable and necessary in order to provide the required intensity of service to ensure the patient's safety. The patient's presenting symptoms, physical exam findings, and initial radiographic and laboratory data in the context of their chronic comorbidities is felt to place them at high risk for further clinical deterioration. Furthermore, it is not anticipated that the patient will be medically stable for discharge from the hospital within 2 midnights of admission. The following factors support the admission status of inpatient.    The patient's presenting symptoms include dress syndrome The worrisome physical exam findings include diffuse maculopapular rash. The initial radiographic and laboratory data are worrisome because of eosinophilia, acute kidney injury, transaminitis. The chronic co-morbidities include hypertension.    * I certify that at the point of admission it is my clinical judgment that the patient will require inpatient hospital care spanning beyond 2 midnights from the point of admission due to high  intensity of service, high risk for further deterioration and high frequency of surveillance  required.Meredeth Ide   [facility] If 7PM-7AM, please contact night-coverage at [email or web address], Office  [phone]   06/03/2019, 2:12 PM  LOS: 3 days

## 2019-06-04 LAB — CBC WITH DIFFERENTIAL/PLATELET
Abs Immature Granulocytes: 0.3 10*3/uL — ABNORMAL HIGH (ref 0.00–0.07)
Basophils Absolute: 0 10*3/uL (ref 0.0–0.1)
Basophils Relative: 0 %
Eosinophils Absolute: 0 10*3/uL (ref 0.0–0.5)
Eosinophils Relative: 0 %
HCT: 24 % — ABNORMAL LOW (ref 39.0–52.0)
Hemoglobin: 8 g/dL — ABNORMAL LOW (ref 13.0–17.0)
Immature Granulocytes: 4 %
Lymphocytes Relative: 21 %
Lymphs Abs: 1.5 10*3/uL (ref 0.7–4.0)
MCH: 32.4 pg (ref 26.0–34.0)
MCHC: 33.3 g/dL (ref 30.0–36.0)
MCV: 97.2 fL (ref 80.0–100.0)
Monocytes Absolute: 1 10*3/uL (ref 0.1–1.0)
Monocytes Relative: 14 %
Neutro Abs: 4.4 10*3/uL (ref 1.7–7.7)
Neutrophils Relative %: 61 %
Platelets: 138 10*3/uL — ABNORMAL LOW (ref 150–400)
RBC: 2.47 MIL/uL — ABNORMAL LOW (ref 4.22–5.81)
RDW: 17.7 % — ABNORMAL HIGH (ref 11.5–15.5)
WBC: 7.2 10*3/uL (ref 4.0–10.5)
nRBC: 1 % — ABNORMAL HIGH (ref 0.0–0.2)

## 2019-06-04 LAB — BASIC METABOLIC PANEL
Anion gap: 10 (ref 5–15)
BUN: 84 mg/dL — ABNORMAL HIGH (ref 8–23)
CO2: 27 mmol/L (ref 22–32)
Calcium: 7.3 mg/dL — ABNORMAL LOW (ref 8.9–10.3)
Chloride: 103 mmol/L (ref 98–111)
Creatinine, Ser: 3.19 mg/dL — ABNORMAL HIGH (ref 0.61–1.24)
GFR calc Af Amer: 21 mL/min — ABNORMAL LOW (ref >60)
GFR calc non Af Amer: 18 mL/min — ABNORMAL LOW (ref >60)
Glucose, Bld: 223 mg/dL — ABNORMAL HIGH (ref 70–99)
Potassium: 4.7 mmol/L (ref 3.5–5.1)
Sodium: 140 mmol/L (ref 135–145)

## 2019-06-04 LAB — GLUCOSE, CAPILLARY
Glucose-Capillary: 207 mg/dL — ABNORMAL HIGH (ref 70–99)
Glucose-Capillary: 178 mg/dL — ABNORMAL HIGH (ref 70–99)
Glucose-Capillary: 229 mg/dL — ABNORMAL HIGH (ref 70–99)
Glucose-Capillary: 288 mg/dL — ABNORMAL HIGH (ref 70–99)

## 2019-06-04 MED ORDER — APIXABAN 5 MG PO TABS
5.0000 mg | ORAL_TABLET | Freq: Two times a day (BID) | ORAL | Status: DC
  Administered 2019-06-04 – 2019-06-07 (×7): 5 mg via ORAL
  Filled 2019-06-04 (×7): qty 1

## 2019-06-04 MED ORDER — CARVEDILOL 25 MG PO TABS
25.0000 mg | ORAL_TABLET | Freq: Two times a day (BID) | ORAL | Status: DC
  Administered 2019-06-04 – 2019-06-07 (×7): 25 mg via ORAL
  Filled 2019-06-04 (×7): qty 1

## 2019-06-04 NOTE — Evaluation (Signed)
Physical Therapy Evaluation Patient Details Name: Zachary Parrish MRN: [id number] DOB: Nov 17, 1940 Today's Date: 06/04/2019   History of Present Illness  79 year old male with a history of diabetes mellitus type 2, gout, hypertension admitted with rash, presented multiorgan involvement/dress syndrome secondary to allopurinol or colchicine.  Also presented with acute kidney injury and dehydration.  Clinical Impression  Patient presents with decreased mobility due to weakness, decreased balance and decreased activity tolerance due to a-fib and will benefit from skilled PT in the acute setting and follow up HHPT upon d/c. Wife present and supportive and able to assist upon d/c. Educated on fall prevention in the home as well.    Follow Up Recommendations Home health PT;Supervision for mobility/OOB    Equipment Recommendations  None recommended by PT    Recommendations for Other Services       Precautions / Restrictions Precautions Precautions: Fall      Mobility  Bed Mobility Overal bed mobility: Modified Independent                Transfers Overall transfer level: Needs assistance   Transfers: Sit to/from Stand Sit to Stand: Supervision         General transfer comment: for safety with balance, IV and pt in a-fib w/RVR  Ambulation/Gait Ambulation/Gait assistance: Supervision;Min guard Gait Distance (Feet): 115 Feet Assistive device: None Gait Pattern/deviations: Step-through pattern;Decreased stride length;Wide base of support;Trunk flexed     General Gait Details: imbalance noted with ambulation, but no LOB, wide BOS and occasional reaching for hand support  Stairs            Wheelchair Mobility    Modified Rankin (Stroke Patients Only)       Balance Overall balance assessment: Needs assistance   Sitting balance-Leahy Scale: Good     Standing balance support: No upper extremity supported;During functional activity Standing balance-Leahy Scale:  Fair                               Pertinent Vitals/Pain Pain Assessment: No/denies pain    Home Living Family/patient expects to be discharged to:: Private residence Living Arrangements: Spouse/significant other Available Help at Discharge: Family;Available 24 hours/day Type of Home: House Home Access: Stairs to enter Entrance Stairs-Rails: Right Entrance Stairs-Number of Steps: 3 Home Layout: Two level;Able to live on main level with bedroom/bathroom Home Equipment: Dan Humphreys - 2 wheels;Cane - quad;Wheelchair - manual Additional Comments: wife thinks they have a shower chair but it was low, it is at another house, not sure if adjustable    Prior Function Level of Independence: Independent         Comments: but some general weakness     Hand Dominance        Extremity/Trunk Assessment   Upper Extremity Assessment Upper Extremity Assessment: RUE deficits/detail RUE Deficits / Details: AROM WFL, strength shoulder flexion 3+/5 with crepitus, othewise WFL    Lower Extremity Assessment Lower Extremity Assessment: RLE deficits/detail RLE Deficits / Details: AROM WFL, some joint changes in foot from prior fracture RLE Sensation: WNL       Communication   Communication: HOH  Cognition Arousal/Alertness: Awake/alert Behavior During Therapy: WFL for tasks assessed/performed Overall Cognitive Status: Within Functional Limits for tasks assessed  General Comments General comments (skin integrity, edema, etc.): noted rash on neck and arms and face, patient with L arm larger than R reports had couple of IV's replaced    Exercises     Assessment/Plan    PT Assessment Patient needs continued PT services  PT Problem List Decreased activity tolerance;Decreased balance;Decreased mobility;Decreased knowledge of use of DME       PT Treatment Interventions DME instruction;Stair training;Therapeutic  activities;Balance training;Therapeutic exercise;Gait training;Functional mobility training;Patient/family education    PT Goals (Current goals can be found in the Care Plan section)  Acute Rehab PT Goals Patient Stated Goal: to improve stamina, strength PT Goal Formulation: With patient/family Time For Goal Achievement: 06/18/19 Potential to Achieve Goals: Good    Frequency Min 3X/week   Barriers to discharge        Co-evaluation               AM-PAC PT "6 Clicks" Mobility  Outcome Measure Help needed turning from your back to your side while in a flat bed without using bedrails?: None Help needed moving from lying on your back to sitting on the side of a flat bed without using bedrails?: None Help needed moving to and from a bed to a chair (including a wheelchair)?: None Help needed standing up from a chair using your arms (e.g., wheelchair or bedside chair)?: A Little Help needed to walk in hospital room?: A Little Help needed climbing 3-5 steps with a railing? : A Little 6 Click Score: 21    End of Session   Activity Tolerance: Treatment limited secondary to medical complications (Comment)(HR up to 169) Patient left: in bed;with call bell/phone within reach   PT Visit Diagnosis: Other abnormalities of gait and mobility (R26.89)    Time: 2376-2831 PT Time Calculation (min) (ACUTE ONLY): 26 min   Charges:   PT Evaluation $PT Eval Low Complexity: 1 Low PT Treatments $Gait Training: 8-22 mins        Magda Kiel, [place] [phone] 06/04/2019   Reginia Naas 06/04/2019, 11:19 AM

## 2019-06-04 NOTE — Progress Notes (Addendum)
Inpatient Diabetes Program Recommendations  AACE/ADA: New Consensus Statement on Inpatient Glycemic Control (2015)  Target Ranges:  Prepandial:   less than 140 mg/dL      Peak postprandial:   less than 180 mg/dL (1-2 hours)      Critically ill patients:  140 - 180 mg/dL   Lab Results  Component Value Date   GLUCAP 207 (H) 06/04/2019   HGBA1C 7.2 (H) 06/01/2019    Review of Glycemic Control Results for KEREM, GILMER (MRN [id number]) as of 06/04/2019 10:31  Ref. Range 06/03/2019 11:51 06/03/2019 16:56 06/03/2019 21:06 06/04/2019 07:53  Glucose-Capillary Latest Ref Range: 70 - 99 mg/dL 838 (H) 184 (H) 037 (H) 207 (H)   Diabetes history: Type 2 DM Outpatient Diabetes medications: none Current orders for Inpatient glycemic control: Novolog 0-9 units TID, Novolog 0-5 units QHS Prednisone 50 mg QAM  Inpatient Diabetes Program Recommendations:    Consider adding Levemir 8 units QD.   Thanks, Lujean Rave, MSN, RNC-OB Diabetes Coordinator [phone] (8a-5p)

## 2019-06-04 NOTE — Procedures (Signed)
Patient declined CPAP for tonight, wanted to use just the oxygen.

## 2019-06-04 NOTE — Progress Notes (Addendum)
[place] for Apixaban Indication: chest pain/ACS  Allergies  Allergen Reactions  . Allopurinol     Possible DRESS syndrome  . Colchicine     Possible DRESS syndrome    Patient Measurements: Height: 6\' 1"  (185.4 cm) Weight: 232 lb 12.8 oz (105.6 kg) IBW/kg (Calculated) : 79.9 Heparin Dosing Weight: 100kg  Vital Signs: Temp: 98.3 F (36.8 C) (03/22 0601) Temp Source: Oral (03/22 0601) BP: 113/72 (03/22 1044) Pulse Rate: 98 (03/22 0601)  Labs: Recent Labs    06/02/19 0459 06/02/19 1625 06/03/19 0340 06/03/19 0340 06/03/19 1140 06/03/19 2119 06/04/19 0554  HGB 8.3*  --  7.7*  --   --   --  8.0*  HCT 25.2*  --  23.1*  --   --   --  24.0*  PLT 115*  --  135*  --   --   --  138*  CREATININE 5.26*   < > 3.96*   < > 3.61* 3.32* 3.19*   < > = values in this interval not displayed.    Estimated Creatinine Clearance: 24.7 mL/min (A) (by C-G formula based on SCr of 3.19 mg/dL (H)).   Medical History: Past Medical History:  Diagnosis Date  . DM (diabetes mellitus) ([place])    no longer on medications  . Gout   . HTN (hypertension)     Medications:  Medications Prior to Admission  Medication Sig Dispense Refill Last Dose  . aspirin 81 MG chewable tablet Chew 81 mg by mouth daily.    Past Week at Unknown time  . atorvastatin (LIPITOR) 40 MG tablet Take 40 mg by mouth daily.   Past Week at Unknown time  . carvedilol (COREG) 25 MG tablet Take 25 mg by mouth 2 (two) times daily.   Past Week at unknown  . Coenzyme Q10 300 MG CAPS Take 1 capsule by mouth daily.   Past Week at Unknown time  . cyanocobalamin 1000 MCG tablet Take 1 tablet by mouth daily.   Past Week at Unknown time  . furosemide (LASIX) 40 MG tablet Take 40 mg by mouth daily.   Past Week at Unknown time  . KLOR-CON M20 20 MEQ tablet Take 10 mEq by mouth daily.    Past Week at Unknown time  . Omega-3 Fatty Acids (FISH OIL) 1000 MG CAPS Take 2 capsules by mouth daily.   Past  Week at Unknown time  . spironolactone (ALDACTONE) 25 MG tablet Take 25 mg by mouth daily.   Past Week at Unknown time  . telmisartan (MICARDIS) 80 MG tablet Take 80 mg by mouth daily.   Past Week at Unknown time   Scheduled:  . apixaban  5 mg Oral BID  . carvedilol  25 mg Oral BID  . famotidine  20 mg Oral Daily  . insulin aspart  0-5 Units Subcutaneous QHS  . insulin aspart  0-9 Units Subcutaneous TID WC  . predniSONE  50 mg Oral Q breakfast    Past Medical History:  Diagnosis Date  . DM (diabetes mellitus) ([place])    no longer on medications  . Gout   . HTN (hypertension)      Assessment: 79 yo male with afib (CHADSVASC= 4). Pharmacy consulted to dose apixaban. He is noted with AKI (basles -hg= 8 (low and stable) -SCr= 3.19 (down; improved from admission ; baseline ~ 1.7)   Goal of Therapy:  Monitor platelets by anticoagulation protocol: Yes   Plan:  -Apixaban 5mg  po  bid -Will provide patient education   Harland German, PharmD Clinical Pharmacist **Pharmacist phone directory can now be found on [email or web address] ([email or web address]).  Listed under [facility].

## 2019-06-04 NOTE — TOC Initial Note (Signed)
Transition of Care [facility]) - Initial/Assessment Note    Patient Details  Name: Zachary Parrish MRN: [id number] Date of Birth: [birth year]  Transition of Care [facility]) CM/SW Contact:    Epifanio Lesches, RN Phone Number: 06/04/2019, 11:24 AM  Clinical Narrative: Admitted with  multiorgan involvement/dress syndrome secondary to allopurinol or colchicine. AKIi, dehydrationHx of diabetes mellitus type 2, gout, hypertension         From home with wife. PTA independent with ADL'S. DME usage, cane.  PT eval./recommendations shared with wife and pt,HHPT. Pt agreeable to [facility] services. Choice list / Medicare HH star ratings shared. Pt/ wife without preference. Referral made with [facility] and accepted.  TOC team will continue to monitor and follow for TOC needs ...   Expected Discharge Plan: Home w Home Health Services Barriers to Discharge: Continued Medical Work up   Patient Goals and CMS Choice Patient states their goals for this hospitalization and ongoing recovery are:: to get better CMS Medicare.gov Compare Post Acute Care list provided to:: Patient Represenative (must comment)(Wife) Choice offered to / list presented to : Patient  Expected Discharge Plan and Services Expected Discharge Plan: Home w Home Health Services   Discharge Planning Services: CM Consult Post Acute Care Choice: Home Health                             HH Arranged: PT   Date [facility] Agency Contacted: 06/04/19 Time HH Agency Contacted: 1122 Representative spoke with at [facility] Agency: Lupita Leash  Prior Living Arrangements/Services   Lives with:: Spouse   Do you feel safe going back to the place where you live?: Yes               Activities of Daily Living      Permission Sought/Granted   Permission granted to share information with : Yes, Verbal Permission Granted  Share Information with NAME: Zachary Parrish (TGGYIR)[phone]           Emotional Assessment Appearance:: Appears stated  age Attitude/Demeanor/Rapport: Gracious Affect (typically observed): Accepting Orientation: : Oriented to Self, Oriented to Place, Oriented to  Time, Oriented to Situation Alcohol / Substance Use: Not Applicable Psych Involvement: No (comment)  Admission diagnosis:  Shock [facility]) [R57.9] Patient Active Problem List   Diagnosis Date Noted  . Shock (HCC) 05/31/2019  . Acute renal failure (ARF) (HCC) 05/31/2019  . Abnormal transaminases 05/31/2019  . Rash 05/31/2019   PCP:  Verl Bangs, MD Pharmacy:   [facility] [phone] - [place], [place] - [address] AT [address] OF [facility] [address] [place] [address] Phone: [phone] Fax: [phone]     Social Determinants of Health (SDOH) Interventions    Readmission Risk Interventions No flowsheet data found.

## 2019-06-04 NOTE — Progress Notes (Signed)
[facility] Progress Note  Subjective: doing well, creat down to 3.3 this am, good UOP (1250), pt is w/o complaints.   Vitals:   06/04/19 0331 06/04/19 0332 06/04/19 0601 06/04/19 1044  BP: 102/77  (!) 146/96 113/72  Pulse: (!) 120  98   Resp:  16 16   Temp:   98.3 F (36.8 C)   TempSrc:   Oral   SpO2: 99%  99%   Weight:   105.6 kg   Height:        Exam: Gen elderly pleasant WM, no distress, calm   Diffuse mild-mod maculopapular skin rash x 4 ext + torso No jvd  Chest clear bilat  Tachycardic on monitor Ext no LE edema Neuro is alert, Ox 3 , nf    Home meds:  - telmisartan 80 qd/ furosemide 40 qd/ aldactone 25 qd/ carvedilol 25 bid  - aspirin 81/ lipitor 40  - KCL 10 qd  - prn's/ vitamins/ supplements    UA 3/19 > negative, 100 prot    CXR 05/31/19 > no acute disease    ECHO 3/19 > LVEF 55-60%.  Mod severe LVH. No diast dysfxn. RV appears normal. No sig valve issues.    Renal US > 10.1/ 11.4 cm kidneys w/o hydro, normal echo   Assessment/ Plan: 1. AKI, severe - presumably acute, we have no old creat labs in the system or outside but he and wife say was following with [facility] [place] -- seen yearly so presumably CKD 3. Suspect AIN related to DRESS as allergy to allopurinol most likely. UA and US unremarkable. Admit creat 6.49 >>> sig improvement down to 3.3 today. D/c IVF today and if maintains can d/c tomorrow.  2. DRESS syndrome - c/w allopurinol which has been stopped. On prednisone po. Improving.  3. HTN - no BP meds here, BP's wnl. Avoid ACEi/ ARB''s/ diuretics for this pt w/ AKI.  4. Met acidosis - d/c bicarb gtt today and follow.  May need oral bicarb.  5. Anemia - per pmd , Hb 7's, transfuse prn  They would prefer to follow up with [facility] on discharge -- I will arrange for labs to be checked in about a week and see me a few days after that.  I will see him tomorrow as well but as long as renal function stable to improved should d/c  tomorrow.      Jannifer Hick MD [facility] Pager [phone]   Recent Labs  Lab 05/31/19 2356 05/31/19 2356 06/01/19 0335 06/01/19 1230 06/03/19 0340 06/03/19 0340 06/03/19 1140 06/03/19 2119 06/04/19 0554  K 5.2*   < > 5.1   < > 4.3   < > 4.6 4.2  --   BUN 121*   < > 115*   < > 98*   < > 94* 85*  --   CREATININE 6.49*   < > 6.09*   < > 3.96*   < > 3.61* 3.32*  --   CALCIUM 7.2*   < > 7.3*   < > 7.3*   < > 7.2* 7.4*  --   PHOS 6.0*  --  6.1*  --   --   --   --   --   --   HGB 8.5*   < > 8.1*   < > 7.7*  --   --   --  8.0*   < > = values in this interval not displayed.   Inpatient medications: . apixaban  5 mg Oral BID  . carvedilol  25 mg Oral BID  . famotidine  20 mg Oral Daily  . insulin aspart  0-5 Units Subcutaneous QHS  . insulin aspart  0-9 Units Subcutaneous TID WC  . predniSONE  50 mg Oral Q breakfast   . sodium chloride Stopped (06/01/19 1341)  .  sodium bicarbonate (isotonic) infusion in sterile water 75 mL/hr at 06/03/19 2310   sodium chloride, diphenhydrAMINE, hydrocerin, metoprolol tartrate

## 2019-06-04 NOTE — Progress Notes (Signed)
[facility]  PROGRESS NOTE  Zachary Parrish WYO:[id number] DOB: 1940/10/25 DOA: 05/31/2019 PCP: Verl Bangs, MD   Brief HPI:   79 year old male with a history of diabetes mellitus type 2, gout, hypertension admitted with rash, presented multiorgan involvement/dress syndrome secondary to allopurinol or colchicine.  Also presented with acute kidney injury and dehydration.  Patient was started on bicarb infusion.  He was initially started on IV antibiotics which was stopped on 06/01/2019.  Renal function slowly improving. Patient was transferred from ICU, [facility] assumed care on 06/02/2019   Subjective   Patient seen and examined, developed atrial fibrillation last night.  Repeat EKG showed normal sinus rhythm.   Assessment/Plan:     1. DRESS syndrome-patient presented with rash, eosinophilia, multiorgan involvement.  Scores 4 on registry of severe cutaneous adverse reactions scoring system, indicates probable DRESS.  Likely culprit is allopurinol or colchicine.  Both medication was started 5 to 6 weeks ago.  Patch testing is unreliable positive in cases of allopurinol induced DRESS.  Continue prednisone 50 mg daily.  He will need a slow taper over 8 to 12 weeks. 2. Paroxysmal atrial fibrillation-patient developed  New onset A. fib last night, converted to normal sinus rhythm.  Patient was taking Coreg at home.  Will restart Coreg 25 mg p.o. twice daily. CHA2DS2VASc score is 4, based on age, hypertension, diabetes mellitus.  Will start anticoagulation with Eliquis.  Monitor CBC in a.m.  He can follow-up with cardiology as outpatient. 3. Acute kidney injury-likely from DRESS as above.  Currently patient on IV hydration with bicarb infusion.  Creatinine significantly improved today to 3.19.  Nephrology recommends to continue 1 more day and if creatinine improved tomorrow patient can be discharged. 4. Transaminitis-shock liver versus DRESS complication.  Liver enzymes are slowly improving.   Follow LFTs in a.m. 5. Diabetes mellitus type 2-hemoglobin A1c is 7.2.  Patient started on sliding scale insulin with NovoLog. 6. Elevated troponin-patient had elevated troponin 0 0.044 at [facility] ED.  Troponin levels checked yesterday are 17 and 18 respectively.   SpO2: 99 % O2 Flow Rate (L/min): 2 L/min   COVID-19 Labs  No results for input(s): DDIMER, FERRITIN, LDH, CRP in the last 72 hours.  No results found for: SARSCOV2NAA   CBG: Recent Labs  Lab 06/03/19 1151 06/03/19 1656 06/03/19 2106 06/04/19 0753 06/04/19 1212  GLUCAP 183* 261* 232* 207* 178*    CBC: Recent Labs  Lab 05/31/19 2356 06/01/19 0335 06/02/19 0459 06/03/19 0340 06/04/19 0554  WBC 8.7 8.3 7.6 6.9 7.2  NEUTROABS  --  3.4 4.2 5.3 4.4  HGB 8.5* 8.1* 8.3* 7.7* 8.0*  HCT 26.5* 25.5* 25.2* 23.1* 24.0*  MCV 100.4* 101.2* 96.6 96.7 97.2  PLT 97* 99* 115* 135* 138*    Basic Metabolic Panel: Recent Labs  Lab 05/31/19 2356 05/31/19 2356 06/01/19 0335 06/01/19 1230 06/02/19 1625 06/03/19 0340 06/03/19 1140 06/03/19 2119 06/04/19 0554  NA 138   < > 139   < > 141 140 141 141 140  K 5.2*   < > 5.1   < > 4.8 4.3 4.6 4.2 4.7  CL 110   < > 109   < > 109 103 104 103 103  CO2 14*   < > 16*   < > 22 25 23 23 27   GLUCOSE 186*   < > 176*   < > 241* 236* 198* 237* 223*  BUN 121*   < > 115*   < > 104* 98* 94* 85* 84*  CREATININE 6.49*   < > 6.09*   < > 4.41* 3.96* 3.61* 3.32* 3.19*  CALCIUM 7.2*   < > 7.3*   < > 7.4* 7.3* 7.2* 7.4* 7.3*  MG 1.7  --  1.8  --   --   --   --  1.9  --   PHOS 6.0*  --  6.1*  --   --   --   --   --   --    < > = values in this interval not displayed.     Liver Function Tests: Recent Labs  Lab 06/01/19 1711 06/02/19 0459 06/02/19 1625 06/03/19 0340 06/03/19 2119  AST 99* 96* 102* 99* 112*  ALT 113* 113* 113* 113* 128*  ALKPHOS 196* 193* 191* 176* 171*  BILITOT 2.3* 2.1* 2.2* 1.7* 2.0*  PROT 5.3* 5.4* 5.3* 5.1* 5.3*  ALBUMIN 2.4* 2.5* 2.4* 2.4* 2.4*         DVT prophylaxis: Heparin  Code Status: Full code  Family Communication: No family at bedside  Disposition Plan: Patient admitted with dress syndrome.  Barrier to discharge-still has acute kidney injury, creatinine slowly improving.  Nephrology has been consulted.         Scheduled medications:  . apixaban  5 mg Oral BID  . carvedilol  25 mg Oral BID  . famotidine  20 mg Oral Daily  . insulin aspart  0-5 Units Subcutaneous QHS  . insulin aspart  0-9 Units Subcutaneous TID WC  . predniSONE  50 mg Oral Q breakfast    Consultants:    Procedures:    Antibiotics:   Anti-infectives (From admission, onward)   Start     Dose/Rate Route Frequency Ordered Stop   06/01/19 1400  ceFEPIme (MAXIPIME) 1 g in sodium chloride 0.9 % 100 mL IVPB  Status:  Discontinued     1 g 200 mL/hr over 30 Minutes Intravenous Every 24 hours 06/01/19 0037 06/01/19 0816   06/01/19 1300  ceFEPIme (MAXIPIME) 2 g in sodium chloride 0.9 % 100 mL IVPB  Status:  Discontinued     2 g 200 mL/hr over 30 Minutes Intravenous Every 24 hours 06/01/19 0816 06/01/19 1043       Objective   Vitals:   06/04/19 0331 06/04/19 0332 06/04/19 0601 06/04/19 1044  BP: 102/77  (!) 146/96 113/72  Pulse: (!) 120  98   Resp:  16 16   Temp:   98.3 F (36.8 C)   TempSrc:   Oral   SpO2: 99%  99%   Weight:   105.6 kg   Height:        Intake/Output Summary (Last 24 hours) at 06/04/2019 1512 Last data filed at 06/04/2019 0700 Gross per 24 hour  Intake 1307.28 ml  Output 800 ml  Net 507.28 ml    03/20 1901 - 03/22 0700 In: 8676 [P.O.:1542; I.V.:2744] Out: 2050 [Urine:2050]  Filed Weights   06/02/19 0501 06/03/19 0318 06/04/19 0601  Weight: 101.5 kg 104.2 kg 105.6 kg    Physical Examination:   General-appears in no acute distress Heart-S1-S2, regular, no murmur auscultated Lungs-clear to auscultation bilaterally, no wheezing or crackles auscultated Abdomen-soft, nontender, no  organomegaly Extremities-no edema in the lower extremities Neuro-alert, oriented x3, no focal deficit noted Skin-faint maculopapular rash on the upper and lower extremities  Data Reviewed:   Recent Results (from the past 240 hour(s))  MRSA PCR Screening     Status: None   Collection Time: 05/31/19 11:56 PM   Specimen:  Nasal Mucosa; Nasopharyngeal  Result Value Ref Range Status   MRSA by PCR NEGATIVE NEGATIVE Final    Comment:        The GeneXpert MRSA Assay (FDA approved for NASAL specimens only), is one component of a comprehensive MRSA colonization surveillance program. It is not intended to diagnose MRSA infection nor to guide or monitor treatment for MRSA infections. Performed at [facility], [address], [place], [place] [address]   Culture, blood (Routine X 2) w Reflex to ID Panel     Status: None (Preliminary result)   Collection Time: 06/01/19 12:22 AM   Specimen: BLOOD  Result Value Ref Range Status   Specimen Description BLOOD LEFT ANTECUBITAL  Final   Special Requests   Final    BOTTLES DRAWN AEROBIC AND ANAEROBIC Blood Culture adequate volume   Culture   Final    NO GROWTH 3 DAYS Performed at [facility], [address] [place], [place] [address]    Report Status PENDING  Incomplete  Culture, blood (Routine X 2) w Reflex to ID Panel     Status: None (Preliminary result)   Collection Time: 06/01/19 12:28 AM   Specimen: BLOOD LEFT HAND  Result Value Ref Range Status   Specimen Description BLOOD LEFT HAND  Final   Special Requests   Final    BOTTLES DRAWN AEROBIC ONLY Blood Culture adequate volume   Culture   Final    NO GROWTH 3 DAYS Performed at [facility], [address] [place], [place] [address]    Report Status PENDING  Incomplete    Recent Labs  Lab 05/31/19 2356  LIPASE 100*  AMYLASE 51   No results for input(s): AMMONIA in the last 168 hours.  Cardiac Enzymes: No results for input(s): CKTOTAL, CKMB, CKMBINDEX,  TROPONINI in the last 168 hours. BNP (last 3 results) No results for input(s): BNP in the last 8760 hours.  ProBNP (last 3 results) No results for input(s): PROBNP in the last 8760 hours.  Studies:  US RENAL  Result Date: June 19, 2019 CLINICAL DATA:  Elevated BUN and creatinine. EXAM: RENAL / URINARY TRACT ULTRASOUND COMPLETE COMPARISON:  None. FINDINGS: Right Kidney: Renal measurements: 10.1 x 6.0 x 5.4 cm = volume: 169.0 mL . Echogenicity within normal limits. No mass or hydronephrosis visualized. Left Kidney: Renal measurements: 11.4 x 4.8 x 5.3 cm = volume: 149.9 mL. Contains a small cyst in the lower pole. Bladder: The bladder is poorly distended limiting evaluation. The wall appears prominent but this could be due to lack of distention. Other: None. IMPRESSION: 1. The kidneys are normal in appearance. There is a small cyst in the lower pole on the left. 2. Poor evaluation of the bladder due to lack of distention. Bladder wall prominence is nonspecific but may be due to lack of distention. Electronically Signed   By: Gerome Sam III M.D   On: 06-19-2019 05:07     Admission status: The appropriate admission status for this patient is INPATIENT. Inpatient status is judged to be reasonable and necessary in order to provide the required intensity of service to ensure the patient's safety. The patient's presenting symptoms, physical exam findings, and initial radiographic and laboratory data in the context of their chronic comorbidities is felt to place them at high risk for further clinical deterioration. Furthermore, it is not anticipated that the patient will be medically stable for discharge from the hospital within 2 midnights of admission. The following factors support  the admission status of inpatient.    The patient's presenting symptoms include dress syndrome The worrisome physical exam findings include diffuse maculopapular rash. The initial radiographic and laboratory data are  worrisome because of eosinophilia, acute kidney injury, transaminitis. The chronic co-morbidities include hypertension.    * I certify that at the point of admission it is my clinical judgment that the patient will require inpatient hospital care spanning beyond 2 midnights from the point of admission due to high intensity of service, high risk for further deterioration and high frequency of surveillance required.Meredeth Ide   [facility] If 7PM-7AM, please contact night-coverage at [email or web address], Office  [phone]   06/04/2019, 3:12 PM  LOS: 4 days

## 2019-06-04 NOTE — Discharge Instructions (Addendum)

## 2019-06-05 ENCOUNTER — Inpatient Hospital Stay (HOSPITAL_COMMUNITY): Payer: Medicare Other

## 2019-06-05 ENCOUNTER — Encounter (HOSPITAL_COMMUNITY): Payer: Self-pay | Admitting: Pulmonary Disease

## 2019-06-05 DIAGNOSIS — M7989 Other specified soft tissue disorders: Secondary | ICD-10-CM

## 2019-06-05 DIAGNOSIS — N179 Acute kidney failure, unspecified: Secondary | ICD-10-CM

## 2019-06-05 DIAGNOSIS — I2510 Atherosclerotic heart disease of native coronary artery without angina pectoris: Secondary | ICD-10-CM

## 2019-06-05 DIAGNOSIS — I480 Paroxysmal atrial fibrillation: Secondary | ICD-10-CM

## 2019-06-05 LAB — CBC WITH DIFFERENTIAL/PLATELET
Abs Immature Granulocytes: 0.32 10*3/uL — ABNORMAL HIGH (ref 0.00–0.07)
Basophils Absolute: 0 10*3/uL (ref 0.0–0.1)
Basophils Relative: 0 %
Eosinophils Absolute: 0 10*3/uL (ref 0.0–0.5)
Eosinophils Relative: 0 %
HCT: 26.5 % — ABNORMAL LOW (ref 39.0–52.0)
Hemoglobin: 8.7 g/dL — ABNORMAL LOW (ref 13.0–17.0)
Immature Granulocytes: 5 %
Lymphocytes Relative: 20 %
Lymphs Abs: 1.3 10*3/uL (ref 0.7–4.0)
MCH: 31.9 pg (ref 26.0–34.0)
MCHC: 32.8 g/dL (ref 30.0–36.0)
MCV: 97.1 fL (ref 80.0–100.0)
Monocytes Absolute: 0.7 10*3/uL (ref 0.1–1.0)
Monocytes Relative: 11 %
Neutro Abs: 4.4 10*3/uL (ref 1.7–7.7)
Neutrophils Relative %: 64 %
Platelets: 146 10*3/uL — ABNORMAL LOW (ref 150–400)
RBC: 2.73 MIL/uL — ABNORMAL LOW (ref 4.22–5.81)
RDW: 18.1 % — ABNORMAL HIGH (ref 11.5–15.5)
WBC: 6.8 10*3/uL (ref 4.0–10.5)
nRBC: 2.3 % — ABNORMAL HIGH (ref 0.0–0.2)

## 2019-06-05 LAB — GLUCOSE, CAPILLARY
Glucose-Capillary: 229 mg/dL — ABNORMAL HIGH (ref 70–99)
Glucose-Capillary: 196 mg/dL — ABNORMAL HIGH (ref 70–99)
Glucose-Capillary: 241 mg/dL — ABNORMAL HIGH (ref 70–99)
Glucose-Capillary: 249 mg/dL — ABNORMAL HIGH (ref 70–99)

## 2019-06-05 LAB — BASIC METABOLIC PANEL
Anion gap: 8 (ref 5–15)
BUN: 73 mg/dL — ABNORMAL HIGH (ref 8–23)
CO2: 30 mmol/L (ref 22–32)
Calcium: 7.7 mg/dL — ABNORMAL LOW (ref 8.9–10.3)
Chloride: 104 mmol/L (ref 98–111)
Creatinine, Ser: 2.95 mg/dL — ABNORMAL HIGH (ref 0.61–1.24)
GFR calc Af Amer: 23 mL/min — ABNORMAL LOW (ref >60)
GFR calc non Af Amer: 20 mL/min — ABNORMAL LOW (ref >60)
Glucose, Bld: 292 mg/dL — ABNORMAL HIGH (ref 70–99)
Potassium: 4.5 mmol/L (ref 3.5–5.1)
Sodium: 142 mmol/L (ref 135–145)

## 2019-06-05 MED ORDER — GENERIC EXTERNAL MEDICATION
Status: DC
Start: ? — End: 2019-06-05

## 2019-06-05 MED ORDER — DILTIAZEM HCL ER COATED BEADS 120 MG PO CP24
120.0000 mg | ORAL_CAPSULE | Freq: Every day | ORAL | Status: DC
  Administered 2019-06-05: 120 mg via ORAL
  Filled 2019-06-05: qty 1

## 2019-06-05 NOTE — Consult Note (Addendum)
Cardiology Consultation:   Patient ID: Zachary Parrish MRN: [id number]; DOB: 1941/03/10  Admit date: 05/31/2019 Date of Consult: 06/05/2019  Primary Care Provider: Jamesetta Geralds, MD Primary Cardiologist: [facility] Primary Electrophysiologist:  None    Patient Profile:   Zachary Parrish is a 79 y.o. male with a hx of  CAD, history of dilated cardiomyopathy with improved EF, HTN, HLD and DM II who is being seen today for the evaluation of atrial fibrillation with RVR at the request of Dr. Darrick Meigs.  History of Present Illness:   Zachary Parrish is a 79 year old male with past medical history of CAD, history of dilated cardiomyopathy with improved EF, HTN, HLD and DM II. patient has a history of dilated cardiomyopathy in the setting of sepsis, ejection fraction later improved.  He underwent cardiac catheterization in 2014 at [facility], this revealed 60 to 70% calcified left main lesion, high-grade ramus artery disease.  Medical therapy was recommended.  The last time he saw his cardiologist Dr. Lamar Blinks at [facility] was on 10/09/2012 at which time he denied any exertional chest pain or shortness of breath.  At that time, Dr. Mauricio Po reduced his Lasix to 20 mg daily and was thinking about stopping his Lasix on the next visit.  He also recommended a sleep study and consider a stress echocardiogram.  He has failed to follow-up since.  More recently, patient was transferred to [facility] on 05/31/2019 after presenting with 3 weeks onset of rash and malaise.  Prior to the onset of symptoms, he was treated with a course of colchicine and allopurinol.  After he started on the medication, he had diarrhea and abdominal pain.  He stopped the medication due to the symptom however his symptom progressed.  On 05/31/2019, he was noted to be hypotensive with systolic blood pressure in the 70s.  He initially presented to [facility] ED where he received 3 L of normal saline.  Lactic acid  was 2.7, creatinine was >7.  Hgb prior to transfer was 9.7. He was started on Levophed and transferred to [facility] for additional evaluation.  All of his previous blood pressure medication were held due to hypotension and severe AKI.  Echocardiogram obtained on 06/01/2019 showed EF 55 to 60%, moderate asymmetric left ventricular hypertrophy of the basal anteroseptal segment.  Renal Doppler showed the kidneys are normal in appearance.  Nephrology was consulted who suspected patient likely has AIN related to DRESS (drug reaction with eosinophilia and systemic symptom) as allergy toward allopurinol.  He was aggressively hydrated.  Allopurinol has been discontinued and he was treated with prednisone.  AKI gradually improved.  He went into new onset of atrial fibrillation on the night of 06/03/2019.  Home carvedilol 25 mg twice daily which was previously held has been restarted since.  He was also started on Eliquis on 3/22.  Heart rate remained in the 110s to 130s range, cardiology service has been consulted for atrial fibrillation with RVR.  Note, during this hospitalization, patient also had a transaminitis which was felt to be either shock liver due to hypotension or DRESS.  Liver enzyme is also slowly improving as well.   Past Medical History:  Diagnosis Date  . CAD (coronary artery disease) 2014   60-70% calcified LM lesion, high grade OM lesion, treated medically.  . DM (diabetes mellitus) ([place])    no longer on medications  . Gout   . HTN (hypertension)   . PAF (paroxysmal atrial fibrillation) ([place]) 06/03/2019  during admission for DRESS    Past Surgical History:  Procedure Laterality Date  . CARDIAC CATHETERIZATION  2014     Home Medications:  Prior to Admission medications   Medication Sig Start Date End Date Taking? Authorizing Provider  aspirin 81 MG chewable tablet Chew 81 mg by mouth daily.    Yes [provider]  atorvastatin (LIPITOR) 40 MG tablet Take 40 mg by  mouth daily. 03/27/19  Yes [provider]  carvedilol (COREG) 25 MG tablet Take 25 mg by mouth 2 (two) times daily. 04/11/19  Yes [provider]  Coenzyme Q10 300 MG CAPS Take 1 capsule by mouth daily.   Yes [provider]  cyanocobalamin 1000 MCG tablet Take 1 tablet by mouth daily. 01/02/19  Yes [provider]  furosemide (LASIX) 40 MG tablet Take 40 mg by mouth daily. 04/02/19  Yes [provider]  KLOR-CON M20 20 MEQ tablet Take 10 mEq by mouth daily.  01/09/19  Yes [provider]  Omega-3 Fatty Acids (FISH OIL) 1000 MG CAPS Take 2 capsules by mouth daily.   Yes [provider]  spironolactone (ALDACTONE) 25 MG tablet Take 25 mg by mouth daily. 05/29/19  Yes [provider]  telmisartan (MICARDIS) 80 MG tablet Take 80 mg by mouth daily. 05/27/19  Yes [provider]    Inpatient Medications: Scheduled Meds: . apixaban  5 mg Oral BID  . carvedilol  25 mg Oral BID  . famotidine  20 mg Oral Daily  . insulin aspart  0-5 Units Subcutaneous QHS  . insulin aspart  0-9 Units Subcutaneous TID WC  . predniSONE  50 mg Oral Q breakfast   Continuous Infusions: . sodium chloride Stopped (06/01/19 1341)   PRN Meds: sodium chloride, diphenhydrAMINE, hydrocerin  Allergies:    Allergies  Allergen Reactions  . Allopurinol     Possible DRESS syndrome  . Colchicine     Possible DRESS syndrome    Social History:   Social History   Socioeconomic History  . Marital status: Married    Spouse name: Not on file  . Number of children: Not on file  . Years of education: Not on file  . Highest education level: Not on file  Occupational History  . Not on file  Tobacco Use  . Smoking status: Former Games developer  . Smokeless tobacco: Current User  . Tobacco comment: quit smoking 50 yrs ago, now chew tobacco  Substance and Sexual Activity  . Alcohol use: Yes    Comment: socially  . Drug use: Never  . Sexual activity:  Not on file  Other Topics Concern  . Not on file  Social History Narrative  . Not on file   Social Determinants of Health   Financial Resource Strain:   . Difficulty of Paying Living Expenses:   Food Insecurity:   . Worried About Programme researcher, broadcasting/film/video in the Last Year:   . Barista in the Last Year:   Transportation Needs:   . Freight forwarder (Medical):   Marland Kitchen Lack of Transportation (Non-Medical):   Physical Activity:   . Days of Exercise per Week:   . Minutes of Exercise per Session:   Stress:   . Feeling of Stress :   Social Connections:   . Frequency of Communication with Friends and Family:   . Frequency of Social Gatherings with Friends and Family:   . Attends Religious Services:   . Active Member of Clubs or  Organizations:   . Attends Banker Meetings:   Marland Kitchen Marital Status:   Intimate Partner Violence:   . Fear of Current or Ex-Partner:   . Emotionally Abused:   Marland Kitchen Physically Abused:   . Sexually Abused:     Family History:   Family History  Problem Relation Age of Onset  . Osteoporosis Mother        passed away at age 74  . Heart attack Father        died at age 45 from massive MI     ROS:  Please see the history of present illness.   All other ROS reviewed and negative.     Physical Exam/Data:   Vitals:   06/04/19 0601 06/04/19 1044 06/05/19 0323 06/05/19 1308  BP: (!) 146/96 113/72 (!) 146/96 (!) 141/90  Pulse: 98  62 (!) 115  Resp: 16  20 18   Temp: 98.3 F (36.8 C)  (!) 97.5 F (36.4 C) 98 F (36.7 C)  TempSrc: Oral  Oral Oral  SpO2: 99%  98% 92%  Weight: 105.6 kg  107.4 kg   Height:        Intake/Output Summary (Last 24 hours) at 06/05/2019 1507 Last data filed at 06/05/2019 0318 Gross per 24 hour  Intake 120 ml  Output 850 ml  Net -730 ml   Last 3 Weights 06/05/2019 06/04/2019 06/03/2019  Weight (lbs) 236 lb 11.2 oz 232 lb 12.8 oz 229 lb 11.2 oz  Weight (kg) 107.366 kg 105.597 kg 104.191 kg     Body mass index is  31.23 kg/m.  General:  Well nourished, well developed, in no acute distress HEENT: normal Lymph: no adenopathy Neck: no JVD Endocrine:  No thryomegaly Vascular: No carotid bruits; FA pulses 2+ bilaterally without bruits  Cardiac:  normal S1, S2; RRR; no murmur  Lungs:  clear to auscultation bilaterally, no wheezing, rhonchi or rales  Abd: soft, nontender, no hepatomegaly  Ext: 2+edema Musculoskeletal:  No deformities, BUE and BLE strength normal and equal Skin: warm and dry  Neuro:  CNs 2-12 intact, no focal abnormalities noted Psych:  Normal affect   EKG:  The EKG was personally reviewed and demonstrates:  Atrial fibrillation with RVR Telemetry:  Telemetry was personally reviewed and demonstrates:  Atrial fibrillation with RVR  Relevant CV Studies:  Echo 06/01/2019 1. Left ventricular ejection fraction, by estimation, is 55 to 60%. The  left ventricle has normal function. The left ventricle has no regional  wall motion abnormalities. There is moderate asymmetric left ventricular  hypertrophy of the basal-septal  segment. LV diastolic function is grossly normal. Suboptimal assessment of  medial tissue Doppler for assessment of LV filling pressure.  2. Right ventricular systolic function is normal. The right ventricular  size is normal. Tricuspid regurgitation signal is inadequate for assessing  PA pressure.  3. The mitral valve is normal in structure. Trivial mitral valve  regurgitation. No evidence of mitral stenosis.  4. The aortic valve is mildly calcified. Aortic valve regurgitation is  not visualized. No aortic stenosis is present.  Laboratory Data:  High Sensitivity Troponin:   Recent Labs  Lab 05/31/19 2356 06/01/19 0335  TROPONINIHS 17 18*     Chemistry Recent Labs  Lab 06/03/19 2119 06/04/19 0554 06/05/19 0331  NA 141 140 142  K 4.2 4.7 4.5  CL 103 103 104  CO2 23 27 30   GLUCOSE 237* 223* 292*  BUN 85* 84* 73*  CREATININE 3.32* 3.19* 2.95*    CALCIUM  7.4* 7.3* 7.7*  GFRNONAA 17* 18* 20*  GFRAA 20* 21* 23*  ANIONGAP 15 10 8     Recent Labs  Lab 06/02/19 1625 2019-06-11 0340 June 11, 2019 2119  PROT 5.3* 5.1* 5.3*  ALBUMIN 2.4* 2.4* 2.4*  AST 102* 99* 112*  ALT 113* 113* 128*  ALKPHOS 191* 176* 171*  BILITOT 2.2* 1.7* 2.0*   Hematology Recent Labs  Lab 2019/06/11 0340 06/04/19 0554 06/05/19 0331  WBC 6.9 7.2 6.8  RBC 2.39* 2.47* 2.73*  HGB 7.7* 8.0* 8.7*  HCT 23.1* 24.0* 26.5*  MCV 96.7 97.2 97.1  MCH 32.2 32.4 31.9  MCHC 33.3 33.3 32.8  RDW 17.4* 17.7* 18.1*  PLT 135* 138* 146*   BNPNo results for input(s): BNP, PROBNP in the last 168 hours.  DDimer No results for input(s): DDIMER in the last 168 hours.   Radiology/Studies:  06/07/19 RENAL  Result Date: 2019/06/11 CLINICAL DATA:  Elevated BUN and creatinine. EXAM: RENAL / URINARY TRACT ULTRASOUND COMPLETE COMPARISON:  None. FINDINGS: Right Kidney: Renal measurements: 10.1 x 6.0 x 5.4 cm = volume: 169.0 mL . Echogenicity within normal limits. No mass or hydronephrosis visualized. Left Kidney: Renal measurements: 11.4 x 4.8 x 5.3 cm = volume: 149.9 mL. Contains a small cyst in the lower pole. Bladder: The bladder is poorly distended limiting evaluation. The wall appears prominent but this could be due to lack of distention. Other: None. IMPRESSION: 1. The kidneys are normal in appearance. There is a small cyst in the lower pole on the left. 2. Poor evaluation of the bladder due to lack of distention. Bladder wall prominence is nonspecific but may be due to lack of distention. Electronically Signed   By: 06/05/2019 III M.D   On: Jun 11, 2019 05:07   VAS 06/05/2019 UPPER EXTREMITY VENOUS DUPLEX  Result Date: 06/05/2019 UPPER VENOUS STUDY  Indications: Swelling Comparison Study: no prior Performing Technologist: 06/07/2019 RVS  Examination Guidelines: A complete evaluation includes B-mode imaging, spectral Doppler, color Doppler, and power Doppler as needed of all accessible portions  of each vessel. Bilateral testing is considered an integral part of a complete examination. Limited examinations for reoccurring indications may be performed as noted.  Left Findings: +----------+------------+---------+-----------+----------+-------+ LEFT      CompressiblePhasicitySpontaneousPropertiesSummary +----------+------------+---------+-----------+----------+-------+ IJV           Full       Yes       Yes                      +----------+------------+---------+-----------+----------+-------+ Subclavian    Full       Yes       Yes                      +----------+------------+---------+-----------+----------+-------+ Axillary      Full       Yes       Yes                      +----------+------------+---------+-----------+----------+-------+ Brachial      Full       Yes       Yes                      +----------+------------+---------+-----------+----------+-------+ Radial        Full                                          +----------+------------+---------+-----------+----------+-------+  Ulnar         Full                                          +----------+------------+---------+-----------+----------+-------+ Cephalic      Full                                          +----------+------------+---------+-----------+----------+-------+ Basilic       Full                                          +----------+------------+---------+-----------+----------+-------+  Summary:  Left: No evidence of deep vein thrombosis in the upper extremity. No evidence of superficial vein thrombosis in the upper extremity.  *See table(s) above for measurements and observations.  Diagnosing physician: Waverly Ferrari MD Electronically signed by Waverly Ferrari MD on 06/05/2019 at 1:07:52 PM.    Final     Assessment and Plan:   1. Paroxysmal atrial fibrillation with RVR  -New onset of atrial fibrillation started on 3/21.  Home carvedilol was initially  held due to hypotension on arrival, this has been restarted.  Addition of low-dose diltiazem 120 mg daily to help control heart rate.  -CHA2DS2-Vasc score 6 (age >63, HTN, CAD, DM II, h/o CHF), started on Eliquis, per case management, Co-Pay is $461.59, will check to see if any less expensive alternative.   -no cardiac awareness. Per Dr. Antoine Poche, will tentative place him on the schedule for TEE DCCV for this Thursday  2. Drug reaction with eosinophilia and systemic symptoms  -Related to allopurinol.  3. Hypotension: Related to #2, resolved, SBP now in the 140s  4. AKI: Initial creatinine greater than 7, patient received steroid therapy for DRESS and IV fluid.  This morning's creatinine was 2.95.  5. Transaminitis: Related to either hypotension or DRESS  6. CAD: Previous cardiac catheterization in 2014 showed 60 to 70% calcified left main lesion, high-grade disease in OM, this was medically managed.  He has been lost to follow-up since July 2014.  Denies any recent chest pain.  - Echo 06/01/2019 showed EF 55-60%  7. Remote history of cardiomyopathy: Occurred in the setting of sepsis.  Ejection fraction quickly recovered.  - 1-2+ pitting edema in bilateral LE, related to hydration received during this admission. Given the fact Cr is still elevated, will hold off on diuresis until the downtrending of the Cr stops.   8. Hypertension: off lasix and telmisartan  9. Hyperlipidemia: continue lipitor  10. DM2: per primary team, diet controlled. Hemoglobin A1C 7.2, will defer to primary team  11. Anemia: Last anemia panel 12/26/2018, high ferritin, normal TIBC, Iron, Iron saturation and folate. Obtain Guiac stool   For questions or updates, please contact [facility] Please consult [email or web address] for contact info under    Ramond Dial, [place]  06/05/2019 3:07 PM   History and all data above reviewed.  Patient examined.  I agree with the findings as above.  The patient has no recent  cardiovascular symptoms coronary artery disease as above.  He has never had any symptoms consistent with atrial fibrillation.  He has never had palpitations, presyncope or syncope.  He has had a complicated hospital course  as above.   We are called to help with atrial fibrillation.    He denies any chest pressure, neck or arm discomfort.  Has had no new shortness of breath, PND or orthopnea.  The patient exam reveals COR: Irregular  ,  Lungs: Decreased breath sounds  ,  Abd: Positive bowel sounds, no rebound no guarding, Ext Moderate edema  .  All available labs, radiology testing, previous records reviewed. Agree with documented assessment and plan.   Atrial fib: Patient will be on anticoagulation.  We should check a stool guaiac given his progressive anemia although it is likely anemia of chronic disease coupled frequent blood draws.  He has had no active bleeding or contraindications overtly to anticoagulation.  We will attempt rate control with Cardizem.  However, if we are not able to do this, given the persistent fibrillation I would suggest that he will need TEE DC cardioversion.  I had a long discussion with the patient today.  His wife is in attendance.  Right now we are holding ARB and Lasix although he might need to continue this when he is discharged as he does have a lot of fluid mobilized.  Fayrene FearingJames Tamitha Norell  3:10 PM  06/05/2019

## 2019-06-05 NOTE — TOC Benefit Eligibility Note (Signed)
Transition of Care [facility]) Benefit Eligibility Note    Patient Details  Name: Zachary Parrish MRN: [id number] Date of Birth: 1940/05/14   Medication/Dose: Eliquis 5 mg 2 x day for 30 days Apixaban not on formulary  Covered?: Yes(Apixaban not on formulary Eliquis is perfered)  Tier: 3 Drug  Prescription Coverage Preferred Pharmacy: local pharmacy  Spoke with Person/Company/Phone Number:: Maria/ [employer] [employer] [phone]  Co-Pay: $461.59  Prior Approval: No  Deductible: Met       Kerin Salen Phone Number: 06/05/2019, 1:26 PM

## 2019-06-05 NOTE — Progress Notes (Signed)
SATURATION QUALIFICATIONS: (This note is used to comply with regulatory documentation for home oxygen)  Patient Saturations on Room Air at Rest = 97%  Patient Saturations on Room Air while Ambulating = 92%  Patient Saturations on 0 Liters of oxygen while Ambulating = 92%  Please briefly explain why patient needs home oxygen: No home O2 needed

## 2019-06-05 NOTE — Care Management (Signed)
06-05-19 1251 Benefits Check submitted for Apixaban 5 mg BID. Case Manager will follow for cost. Graves-Bigelow, Lamar Laundry, RN,BSN Case Manager

## 2019-06-05 NOTE — Progress Notes (Addendum)
[facility]  PROGRESS NOTE  Zachary Parrish NGE:[id number] DOB: Nov 30, 1940 DOA: 05/31/2019 PCP: Verl Bangs, MD   Brief HPI:   79 year old male with a history of diabetes mellitus type 2, gout, hypertension admitted with rash, presented multiorgan involvement/dress syndrome secondary to allopurinol or colchicine.  Also presented with acute kidney injury and dehydration.  Patient was started on bicarb infusion.  He was initially started on IV antibiotics which was stopped on 06/01/2019.  Renal function slowly improving. Patient was transferred from ICU, [facility] assumed care on 06/02/2019   Subjective   Patient seen and examined, still continues to A. fib with RVR, with heart rate up to 130s  despite being on Coreg.   Assessment/Plan:     1. DRESS syndrome-patient presented with rash, eosinophilia, multiorgan involvement.  Significantly improved.  Scores 4 on registry of severe cutaneous adverse reactions scoring system, indicates probable DRESS.  Likely culprit is allopurinol or colchicine.  Both medication was started 5 to 6 weeks ago.  Patch testing is unreliable positive in cases of allopurinol induced DRESS.  Continue prednisone 50 mg daily.  He will need a slow taper over 8 to 12 weeks.  2. Paroxysmal atrial fibrillation-patient developed  New onset A. fib on 06/03/2019 patient was taking Coreg at home.  Coreg was restarted.  CHA2DS2VASc score is 4, based on age, hypertension, diabetes mellitus.  Started him on Eliquis for anticoagulation.  Patient continues to have episodes of A. fib with RVR, rate difficult to control.  Will consult cardiology for further recommendations.    3. Left arm swelling-patient complained of left arm swelling, concern for venous thrombosis.  Venous duplex of left upper extremity showed no DVT or superficial vein thrombosis.  4. Acute kidney injury-likely from DRESS as above.  Currently patient on IV hydration with bicarb infusion.  Creatinine significantly  improved today to 2.95, nephrology has cleared the patient for discharge.  He has follow-up appointment with Dr. Glenna Fellows on 06/13/19. As per Dr Glenna Fellows- OK to d/c today --> to have labs at [facility] on Monday 06/11/19 and will see me in clinic 3/31.  My office will call him with a time; he has my card as well.   5. Transaminitis-shock liver versus DRESS complication.  Liver enzymes are slowly improving.  Follow LFTs in a.m.  6. Diabetes mellitus type 2-hemoglobin A1c is 7.2.  Patient started on sliding scale insulin with NovoLog.  He will need sliding scale of insulin at the time of discharge.  7. Elevated troponin-patient had elevated troponin 0 0.044 at [facility] ED.  Troponin levels checked yesterday are 17 and 18 respectively.   SpO2: 92 % O2 Flow Rate (L/min): 2 L/min   COVID-19 Labs  No results for input(s): DDIMER, FERRITIN, LDH, CRP in the last 72 hours.  No results found for: SARSCOV2NAA   CBG: Recent Labs  Lab 06/04/19 1212 06/04/19 1605 06/04/19 2159 06/05/19 0755 06/05/19 1224  GLUCAP 178* 229* 288* 229* 196*    CBC: Recent Labs  Lab 06/01/19 0335 06/02/19 0459 06/03/19 0340 06/04/19 0554 06/05/19 0331  WBC 8.3 7.6 6.9 7.2 6.8  NEUTROABS 3.4 4.2 5.3 4.4 4.4  HGB 8.1* 8.3* 7.7* 8.0* 8.7*  HCT 25.5* 25.2* 23.1* 24.0* 26.5*  MCV 101.2* 96.6 96.7 97.2 97.1  PLT 99* 115* 135* 138* 146*    Basic Metabolic Panel: Recent Labs  Lab 05/31/19 2356 05/31/19 2356 06/01/19 0335 06/01/19 1230 06/03/19 0340 06/03/19 1140 06/03/19 2119 06/04/19 0554 06/05/19 0331  NA 138   < >  139   < > 140 141 141 140 142  K 5.2*   < > 5.1   < > 4.3 4.6 4.2 4.7 4.5  CL 110   < > 109   < > 103 104 103 103 104  CO2 14*   < > 16*   < > 25 23 23 27 30   GLUCOSE 186*   < > 176*   < > 236* 198* 237* 223* 292*  BUN 121*   < > 115*   < > 98* 94* 85* 84* 73*  CREATININE 6.49*   < > 6.09*   < > 3.96* 3.61* 3.32* 3.19* 2.95*  CALCIUM 7.2*   < > 7.3*   < > 7.3* 7.2* 7.4* 7.3* 7.7*  MG 1.7   --  1.8  --   --   --  1.9  --   --   PHOS 6.0*  --  6.1*  --   --   --   --   --   --    < > = values in this interval not displayed.     Liver Function Tests: Recent Labs  Lab 06/01/19 1711 06/02/19 0459 06/02/19 1625 06/03/19 0340 06/03/19 2119  AST 99* 96* 102* 99* 112*  ALT 113* 113* 113* 113* 128*  ALKPHOS 196* 193* 191* 176* 171*  BILITOT 2.3* 2.1* 2.2* 1.7* 2.0*  PROT 5.3* 5.4* 5.3* 5.1* 5.3*  ALBUMIN 2.4* 2.5* 2.4* 2.4* 2.4*        DVT prophylaxis: Heparin  Code Status: Full code  Family Communication: No family at bedside  Disposition Plan: Patient admitted with dress syndrome.  Barrier to discharge-developed new onset A. fib with RVR, cardiology consulted.  If no improvement then plan for TEE/DCCV         Scheduled medications:  . apixaban  5 mg Oral BID  . carvedilol  25 mg Oral BID  . famotidine  20 mg Oral Daily  . insulin aspart  0-5 Units Subcutaneous QHS  . insulin aspart  0-9 Units Subcutaneous TID WC  . predniSONE  50 mg Oral Q breakfast    Consultants:    Procedures:    Antibiotics:   Anti-infectives (From admission, onward)   Start     Dose/Rate Route Frequency Ordered Stop   06/01/19 1400  ceFEPIme (MAXIPIME) 1 g in sodium chloride 0.9 % 100 mL IVPB  Status:  Discontinued     1 g 200 mL/hr over 30 Minutes Intravenous Every 24 hours 06/01/19 0037 06/01/19 0816   06/01/19 1300  ceFEPIme (MAXIPIME) 2 g in sodium chloride 0.9 % 100 mL IVPB  Status:  Discontinued     2 g 200 mL/hr over 30 Minutes Intravenous Every 24 hours 06/01/19 0816 06/01/19 1043       Objective   Vitals:   06/04/19 0601 06/04/19 1044 06/05/19 0323 06/05/19 1308  BP: (!) 146/96 113/72 (!) 146/96 (!) 141/90  Pulse: 98  62 (!) 115  Resp: 16  20 18   Temp: 98.3 F (36.8 C)  (!) 97.5 F (36.4 C) 98 F (36.7 C)  TempSrc: Oral  Oral Oral  SpO2: 99%  98% 92%  Weight: 105.6 kg  107.4 kg   Height:        Intake/Output Summary (Last 24 hours) at  06/05/2019 1521 Last data filed at 06/05/2019 0318 Gross per 24 hour  Intake 120 ml  Output 850 ml  Net -730 ml    03/21 1901 - 03/23 0700  In: 1202.3 [P.O.:480; I.V.:722.3] Out: 2050 [Urine:2050]  Filed Weights   06/03/19 0318 06/04/19 0601 06/05/19 0323  Weight: 104.2 kg 105.6 kg 107.4 kg    Physical Examination:  General-appears in no acute distress Heart-S1-S2, regular, no murmur auscultated Lungs-clear to auscultation bilaterally, no wheezing or crackles auscultated Abdomen-soft, nontender, no organomegaly Extremities-no edema in the lower extremities, mild edema in the left upper extremity Neuro-alert, oriented x3, no focal deficit noted Skin-faint maculopapular rash noted on the upper and lower extremities, trunk.  Data Reviewed:   Recent Results (from the past 240 hour(s))  MRSA PCR Screening     Status: None   Collection Time: 05/31/19 11:56 PM   Specimen: Nasal Mucosa; Nasopharyngeal  Result Value Ref Range Status   MRSA by PCR NEGATIVE NEGATIVE Final    Comment:        The GeneXpert MRSA Assay (FDA approved for NASAL specimens only), is one component of a comprehensive MRSA colonization surveillance program. It is not intended to diagnose MRSA infection nor to guide or monitor treatment for MRSA infections. Performed at [facility], [place] [address], [place], [place] [address]   Culture, blood (Routine X 2) w Reflex to ID Panel     Status: None (Preliminary result)   Collection Time: 06/01/19 12:22 AM   Specimen: BLOOD  Result Value Ref Range Status   Specimen Description BLOOD LEFT ANTECUBITAL  Final   Special Requests   Final    BOTTLES DRAWN AEROBIC AND ANAEROBIC Blood Culture adequate volume   Culture NO GROWTH 4 DAYS  Final   Report Status PENDING  Incomplete  Culture, blood (Routine X 2) w Reflex to ID Panel     Status: None (Preliminary result)   Collection Time: 06/01/19 12:28 AM   Specimen: BLOOD LEFT HAND  Result Value Ref Range Status    Specimen Description BLOOD LEFT HAND  Final   Special Requests   Final    BOTTLES DRAWN AEROBIC ONLY Blood Culture adequate volume   Culture NO GROWTH 4 DAYS  Final   Report Status PENDING  Incomplete    Recent Labs  Lab 05/31/19 2356  LIPASE 100*  AMYLASE 51   No results for input(s): AMMONIA in the last 168 hours.  Cardiac Enzymes: No results for input(s): CKTOTAL, CKMB, CKMBINDEX, TROPONINI in the last 168 hours. BNP (last 3 results) No results for input(s): BNP in the last 8760 hours.  ProBNP (last 3 results) No results for input(s): PROBNP in the last 8760 hours.  Studies:  VAS [place] UPPER EXTREMITY VENOUS DUPLEX  Result Date: 06/05/2019 UPPER VENOUS STUDY  Indications: Swelling Comparison Study: no prior Performing Technologist: Abram Sander RVS  Examination Guidelines: A complete evaluation includes B-mode imaging, spectral Doppler, color Doppler, and power Doppler as needed of all accessible portions of each vessel. Bilateral testing is considered an integral part of a complete examination. Limited examinations for reoccurring indications may be performed as noted.  Left Findings: +----------+------------+---------+-----------+----------+-------+ LEFT      CompressiblePhasicitySpontaneousPropertiesSummary +----------+------------+---------+-----------+----------+-------+ IJV           Full       Yes       Yes                      +----------+------------+---------+-----------+----------+-------+ Subclavian    Full       Yes       Yes                      +----------+------------+---------+-----------+----------+-------+  Axillary      Full       Yes       Yes                      +----------+------------+---------+-----------+----------+-------+ Brachial      Full       Yes       Yes                      +----------+------------+---------+-----------+----------+-------+ Radial        Full                                           +----------+------------+---------+-----------+----------+-------+ Ulnar         Full                                          +----------+------------+---------+-----------+----------+-------+ Cephalic      Full                                          +----------+------------+---------+-----------+----------+-------+ Basilic       Full                                          +----------+------------+---------+-----------+----------+-------+  Summary:  Left: No evidence of deep vein thrombosis in the upper extremity. No evidence of superficial vein thrombosis in the upper extremity.  *See table(s) above for measurements and observations.  Diagnosing physician: Waverly Ferrari MD Electronically signed by Waverly Ferrari MD on 06/05/2019 at 1:07:52 PM.    Final      Admission status: The appropriate admission status for this patient is INPATIENT. Inpatient status is judged to be reasonable and necessary in order to provide the required intensity of service to ensure the patient's safety. The patient's presenting symptoms, physical exam findings, and initial radiographic and laboratory data in the context of their chronic comorbidities is felt to place them at high risk for further clinical deterioration. Furthermore, it is not anticipated that the patient will be medically stable for discharge from the hospital within 2 midnights of admission. The following factors support the admission status of inpatient.    The patient's presenting symptoms include dress syndrome The worrisome physical exam findings include diffuse maculopapular rash. The initial radiographic and laboratory data are worrisome because of eosinophilia, acute kidney injury, transaminitis. The chronic co-morbidities include hypertension.    * I certify that at the point of admission it is my clinical judgment that the patient will require inpatient hospital care spanning beyond 2 midnights from the point of  admission due to high intensity of service, high risk for further deterioration and high frequency of surveillance required.Meredeth Ide   [facility] If 7PM-7AM, please contact night-coverage at [email or web address], Office  [phone]   06/05/2019, 3:21 PM  LOS: 5 days

## 2019-06-05 NOTE — Progress Notes (Signed)
[facility] Progress Note  Subjective: doing well, creat down to 2.95 this am, good UOP (1250). Having A fib with RVR and notes LUE edematous.  Vitals:   06/04/19 0332 06/04/19 0601 06/04/19 1044 06/05/19 0323  BP:  (!) 146/96 113/72 (!) 146/96  Pulse:  98  62  Resp: 16 16  20   Temp:  98.3 F (36.8 C)  (!) 97.5 F (36.4 C)  TempSrc:  Oral  Oral  SpO2:  99%  98%  Weight:  105.6 kg  107.4 kg  Height:        Exam: Gen elderly pleasant WM, no distress, calm   Diffuse mild-mod maculopapular skin rash x 4 ext + torso --> improving No jvd  Chest clear bilat  CV tachy and irregular Ext no LE edema, LUE with IV has 1+ pitting edema Neuro is alert, Ox 3 , nf    Home meds:  - telmisartan 80 qd/ furosemide 40 qd/ aldactone 25 qd/ carvedilol 25 bid  - aspirin 81/ lipitor 40  - KCL 10 qd  - prn's/ vitamins/ supplements    UA 3/19 > negative, 100 prot    CXR 05/31/19 > no acute disease    ECHO 3/19 > LVEF 55-60%.  Mod severe LVH. No diast dysfxn. RV appears normal. No sig valve issues.    Renal US > 10.1/ 11.4 cm kidneys w/o hydro, normal echo   Assessment/ Plan: 1. AKI, severe - presumably acute, we have no old creat labs in the system or outside but he and wife say was following with [facility] -- seen yearly so presumably CKD 3. Suspect AIN related to DRESS as allergy to allopurinol vs colchicine most likely. UA and US unremarkable. Admit creat 6.49 >>> sig improvement down to 2.95 today even without fluids yesterday.  OK to d/c today --> to have labs at [facility] on Monday 06/11/19 and will see me in clinic 3/31.  My office will call him with a time; he has my card as well.  2. DRESS syndrome - c/w colchicine vs allopurinol which has been stopped. On prednisone po. Improving.  3. HTN - on coreg 25 BID.Would hold aldactone, telmisartan at discharge.  Resume outpt lasix 40 daily - was chronic med and with steroids he's going to have a tendency to retain  na/fluid.  4. Met acidosis - resolved 5. Anemia - per pmd , Hb 7'-8s 6. A fib with RVR - primary consulting cardiology 7. LUE edema - per primary  I will sign off and fu with pt in office next week per above. Call with questions.  Jannifer Hick MD [facility] Pager [phone]   Recent Labs  Lab 05/31/19 2356 05/31/19 2356 06/01/19 0335 06/01/19 1230 06/04/19 0554 06/05/19 0331  K 5.2*   < > 5.1   < > 4.7 4.5  BUN 121*   < > 115*   < > 84* 73*  CREATININE 6.49*   < > 6.09*   < > 3.19* 2.95*  CALCIUM 7.2*   < > 7.3*   < > 7.3* 7.7*  PHOS 6.0*  --  6.1*  --   --   --   HGB 8.5*   < > 8.1*   < > 8.0* 8.7*   < > = values in this interval not displayed.   Inpatient medications: . apixaban  5 mg Oral BID  . carvedilol  25 mg Oral BID  . famotidine  20 mg Oral Daily  .  insulin aspart  0-5 Units Subcutaneous QHS  . insulin aspart  0-9 Units Subcutaneous TID WC  . predniSONE  50 mg Oral Q breakfast   . sodium chloride Stopped (06/01/19 1341)   sodium chloride, diphenhydrAMINE, hydrocerin

## 2019-06-05 NOTE — Progress Notes (Signed)
  Patient refused CPAP for HS. Aware to call for Respiratory is he desires CPAP. States he is claustrophobic and cant tolerate the mask over his face.

## 2019-06-05 NOTE — Progress Notes (Signed)
Inpatient Diabetes Program Recommendations  AACE/ADA: New Consensus Statement on Inpatient Glycemic Control (2015)  Target Ranges:  Prepandial:   less than 140 mg/dL      Peak postprandial:   less than 180 mg/dL (1-2 hours)      Critically ill patients:  140 - 180 mg/dL   Lab Results  Component Value Date   GLUCAP 229 (H) 06/05/2019   HGBA1C 7.2 (H) 06/01/2019    Review of Glycemic Control Results for Zachary Parrish, Zachary Parrish (MRN [id number]) as of 06/05/2019 10:25  Ref. Range 06/04/2019 12:12 06/04/2019 16:05 06/04/2019 21:59 06/05/2019 07:55  Glucose-Capillary Latest Ref Range: 70 - 99 mg/dL 035 (H) 248 (H) 185 (H) 229 (H)   Diabetes history: Type 2 DM Outpatient Diabetes medications: none Current orders for Inpatient glycemic control: Novolog 0-9 units TID, Novolog 0-5 units QHS Prednisone 50 mg QAM  Inpatient Diabetes Program Recommendations:    Consider adding Levemir 8 units QD.   Thanks, Lujean Rave, MSN, RNC-OB Diabetes Coordinator [phone] (8a-5p)

## 2019-06-05 NOTE — Progress Notes (Signed)
Upper extremity venous has been completed.   Preliminary results in CV Proc.   Blanch Media 06/05/2019 11:45 AM

## 2019-06-06 ENCOUNTER — Encounter (HOSPITAL_COMMUNITY): Payer: Self-pay | Admitting: Pulmonary Disease

## 2019-06-06 ENCOUNTER — Other Ambulatory Visit: Payer: Self-pay

## 2019-06-06 DIAGNOSIS — D7212 Drug rash with eosinophilia and systemic symptoms syndrome: Principal | ICD-10-CM

## 2019-06-06 DIAGNOSIS — T50905A Adverse effect of unspecified drugs, medicaments and biological substances, initial encounter: Secondary | ICD-10-CM

## 2019-06-06 LAB — COMPREHENSIVE METABOLIC PANEL
ALT: 161 U/L — ABNORMAL HIGH (ref 0–44)
AST: 107 U/L — ABNORMAL HIGH (ref 15–41)
Albumin: 2.5 g/dL — ABNORMAL LOW (ref 3.5–5.0)
Alkaline Phosphatase: 142 U/L — ABNORMAL HIGH (ref 38–126)
Anion gap: 10 (ref 5–15)
BUN: 65 mg/dL — ABNORMAL HIGH (ref 8–23)
CO2: 29 mmol/L (ref 22–32)
Calcium: 7.9 mg/dL — ABNORMAL LOW (ref 8.9–10.3)
Chloride: 103 mmol/L (ref 98–111)
Creatinine, Ser: 2.73 mg/dL — ABNORMAL HIGH (ref 0.61–1.24)
GFR calc Af Amer: 25 mL/min — ABNORMAL LOW (ref >60)
GFR calc non Af Amer: 21 mL/min — ABNORMAL LOW (ref >60)
Glucose, Bld: 237 mg/dL — ABNORMAL HIGH (ref 70–99)
Potassium: 4.8 mmol/L (ref 3.5–5.1)
Sodium: 142 mmol/L (ref 135–145)
Total Bilirubin: 2.7 mg/dL — ABNORMAL HIGH (ref 0.3–1.2)
Total Protein: 5.2 g/dL — ABNORMAL LOW (ref 6.5–8.1)

## 2019-06-06 LAB — TSH: TSH: 2.346 u[IU]/mL (ref 0.350–4.500)

## 2019-06-06 LAB — CBC WITH DIFFERENTIAL/PLATELET
Abs Immature Granulocytes: 0.21 10*3/uL — ABNORMAL HIGH (ref 0.00–0.07)
Basophils Absolute: 0 10*3/uL (ref 0.0–0.1)
Basophils Relative: 0 %
Eosinophils Absolute: 0 10*3/uL (ref 0.0–0.5)
Eosinophils Relative: 0 %
HCT: 26.1 % — ABNORMAL LOW (ref 39.0–52.0)
Hemoglobin: 8.6 g/dL — ABNORMAL LOW (ref 13.0–17.0)
Immature Granulocytes: 3 %
Lymphocytes Relative: 15 %
Lymphs Abs: 0.9 10*3/uL (ref 0.7–4.0)
MCH: 32.6 pg (ref 26.0–34.0)
MCHC: 33 g/dL (ref 30.0–36.0)
MCV: 98.9 fL (ref 80.0–100.0)
Monocytes Absolute: 0.8 10*3/uL (ref 0.1–1.0)
Monocytes Relative: 13 %
Neutro Abs: 4.2 10*3/uL (ref 1.7–7.7)
Neutrophils Relative %: 69 %
Platelets: 138 10*3/uL — ABNORMAL LOW (ref 150–400)
RBC: 2.64 MIL/uL — ABNORMAL LOW (ref 4.22–5.81)
RDW: 18.9 % — ABNORMAL HIGH (ref 11.5–15.5)
WBC: 6.1 10*3/uL (ref 4.0–10.5)
nRBC: 2.5 % — ABNORMAL HIGH (ref 0.0–0.2)

## 2019-06-06 LAB — CULTURE, BLOOD (ROUTINE X 2)
Culture: NO GROWTH
Culture: NO GROWTH
Special Requests: ADEQUATE
Special Requests: ADEQUATE

## 2019-06-06 LAB — OCCULT BLOOD X 1 CARD TO LAB, STOOL: Fecal Occult Bld: NEGATIVE

## 2019-06-06 LAB — MAGNESIUM: Magnesium: 2.2 mg/dL (ref 1.7–2.4)

## 2019-06-06 LAB — GLUCOSE, CAPILLARY
Glucose-Capillary: 223 mg/dL — ABNORMAL HIGH (ref 70–99)
Glucose-Capillary: 201 mg/dL — ABNORMAL HIGH (ref 70–99)
Glucose-Capillary: 227 mg/dL — ABNORMAL HIGH (ref 70–99)
Glucose-Capillary: 261 mg/dL — ABNORMAL HIGH (ref 70–99)

## 2019-06-06 MED ORDER — DILTIAZEM HCL ER COATED BEADS 180 MG PO CP24
180.0000 mg | ORAL_CAPSULE | Freq: Every day | ORAL | Status: DC
  Administered 2019-06-06 – 2019-06-07 (×2): 180 mg via ORAL
  Filled 2019-06-06 (×2): qty 1

## 2019-06-06 MED ORDER — INSULIN GLARGINE 100 UNIT/ML ~~LOC~~ SOLN
8.0000 [IU] | Freq: Every day | SUBCUTANEOUS | Status: DC
  Administered 2019-06-06 – 2019-06-07 (×2): 8 [IU] via SUBCUTANEOUS
  Filled 2019-06-06 (×2): qty 0.08

## 2019-06-06 MED ORDER — APIXABAN 5 MG PO TABS: 5.0000 mg | ORAL_TABLET | Freq: Two times a day (BID) | ORAL | 1 refills | Status: DC

## 2019-06-06 NOTE — H&P (View-Only) (Signed)
Progress Note  Patient Name: Zachary Parrish Date of Encounter: 06/06/2019  Primary Cardiologist: Rollene Rotunda, MD   Subjective   Patient remains in afib RVR, rates 100-120. He is asymptomatic. Feels very anxious since he is feeling confined.   Inpatient Medications    Scheduled Meds: . apixaban  5 mg Oral BID  . carvedilol  25 mg Oral BID  . diltiazem  120 mg Oral Daily  . famotidine  20 mg Oral Daily  . insulin aspart  0-5 Units Subcutaneous QHS  . insulin aspart  0-9 Units Subcutaneous TID WC  . predniSONE  50 mg Oral Q breakfast   Continuous Infusions: . sodium chloride Stopped (06/01/19 1341)   PRN Meds: sodium chloride, diphenhydrAMINE, hydrocerin   Vital Signs    Vitals:   06/05/19 2008 06/05/19 2119 06/06/19 0500 06/06/19 0550  BP: (!) 158/90   (!) 141/94  Pulse: (!) 140 (!) 107  87  Resp:  18  18  Temp: (!) 97.5 F (36.4 C)   98 F (36.7 C)  TempSrc: Oral   Oral  SpO2: 95% 93%  92%  Weight:   99.8 kg   Height:        Intake/Output Summary (Last 24 hours) at 06/06/2019 0707 Last data filed at 06/05/2019 1610 Gross per 24 hour  Intake -  Output 225 ml  Net -225 ml   Last 3 Weights 06/06/2019 06/05/2019 06/04/2019  Weight (lbs) 220 lb 0.3 oz 236 lb 11.2 oz 232 lb 12.8 oz  Weight (kg) 99.8 kg 107.366 kg 105.597 kg      Telemetry    Afib, HR 100-120s, around 100 overnight; rare PVCS - Personally Reviewed  ECG    No new - Personally Reviewed  Physical Exam   GEN: No acute distress.   Neck: No JVD Cardiac: Irreg Irreg, no murmurs, rubs, or gallops.  Respiratory: Clear to auscultation bilaterally. GI: Soft, nontender, non-distended  MS: 1+ lower leg edema; No deformity. Neuro:  Nonfocal  Psych: Normal affect   Labs    High Sensitivity Troponin:   Recent Labs  Lab 05/31/19 2356 06/01/19 0335  TROPONINIHS 17 18*      Chemistry Recent Labs  Lab 06/02/19 1625 06/02/19 1625 06/03/19 0340 06/03/19 1140 06/03/19 2119 06/04/19 0554  06/05/19 0331  NA 141   < > 140   < > 141 140 142  K 4.8   < > 4.3   < > 4.2 4.7 4.5  CL 109   < > 103   < > 103 103 104  CO2 22   < > 25   < > 23 27 30   GLUCOSE 241*   < > 236*   < > 237* 223* 292*  BUN 104*   < > 98*   < > 85* 84* 73*  CREATININE 4.41*   < > 3.96*   < > 3.32* 3.19* 2.95*  CALCIUM 7.4*   < > 7.3*   < > 7.4* 7.3* 7.7*  PROT 5.3*  --  5.1*  --  5.3*  --   --   ALBUMIN 2.4*  --  2.4*  --  2.4*  --   --   AST 102*  --  99*  --  112*  --   --   ALT 113*  --  113*  --  128*  --   --   ALKPHOS 191*  --  176*  --  171*  --   --  BILITOT 2.2*  --  1.7*  --  2.0*  --   --   GFRNONAA 12*   < > 14*   < > 17* 18* 20*  GFRAA 14*   < > 16*   < > 20* 21* 23*  ANIONGAP 10   < > 12   < > 15 10 8    < > = values in this interval not displayed.     Hematology Recent Labs  Lab 06/04/19 0554 06/05/19 0331 06/06/19 0413  WBC 7.2 6.8 6.1  RBC 2.47* 2.73* 2.64*  HGB 8.0* 8.7* 8.6*  HCT 24.0* 26.5* 26.1*  MCV 97.2 97.1 98.9  MCH 32.4 31.9 32.6  MCHC 33.3 32.8 33.0  RDW 17.7* 18.1* 18.9*  PLT 138* 146* 138*    BNPNo results for input(s): BNP, PROBNP in the last 168 hours.   DDimer No results for input(s): DDIMER in the last 168 hours.   Radiology    VAS 06/08/19 UPPER EXTREMITY VENOUS DUPLEX  Result Date: 06/05/2019 UPPER VENOUS STUDY  Indications: Swelling Comparison Study: no prior Performing Technologist: 06/07/2019 RVS  Examination Guidelines: A complete evaluation includes B-mode imaging, spectral Doppler, color Doppler, and power Doppler as needed of all accessible portions of each vessel. Bilateral testing is considered an integral part of a complete examination. Limited examinations for reoccurring indications may be performed as noted.  Left Findings: +----------+------------+---------+-----------+----------+-------+ LEFT      CompressiblePhasicitySpontaneousPropertiesSummary +----------+------------+---------+-----------+----------+-------+ IJV           Full        Yes       Yes                      +----------+------------+---------+-----------+----------+-------+ Subclavian    Full       Yes       Yes                      +----------+------------+---------+-----------+----------+-------+ Axillary      Full       Yes       Yes                      +----------+------------+---------+-----------+----------+-------+ Brachial      Full       Yes       Yes                      +----------+------------+---------+-----------+----------+-------+ Radial        Full                                          +----------+------------+---------+-----------+----------+-------+ Ulnar         Full                                          +----------+------------+---------+-----------+----------+-------+ Cephalic      Full                                          +----------+------------+---------+-----------+----------+-------+ Basilic       Full                                          +----------+------------+---------+-----------+----------+-------+  Summary:  Left: No evidence of deep vein thrombosis in the upper extremity. No evidence of superficial vein thrombosis in the upper extremity.  *See table(s) above for measurements and observations.  Diagnosing physician: Deitra Mayo MD Electronically signed by Deitra Mayo MD on 06/05/2019 at 1:07:52 PM.    Final     Cardiac Studies   Echo 06/01/19 1. Left ventricular ejection fraction, by estimation, is 55 to 60%. The  left ventricle has normal function. The left ventricle has no regional  wall motion abnormalities. There is moderate asymmetric left ventricular  hypertrophy of the basal-septal  segment. LV diastolic function is grossly normal. Suboptimal assessment of  medial tissue Doppler for assessment of LV filling pressure.  2. Right ventricular systolic function is normal. The right ventricular  size is normal. Tricuspid regurgitation signal is  inadequate for assessing  PA pressure.  3. The mitral valve is normal in structure. Trivial mitral valve  regurgitation. No evidence of mitral stenosis.  4. The aortic valve is mildly calcified. Aortic valve regurgitation is  not visualized. No aortic stenosis is present.   Patient Profile     79 y.o. male with pmh of CAD, h/o of dilated CM with improved EF, HTN, HLD, and DM2 who is being seen for the evaluation of afib RVR.  Assessment & Plan    Paroxysmal Afib with RVR - New onset this admission - Home carvedilol was restarted, coreg 25mg  BID - Dilt 120mg  started as well - CHADSVASC 6 and started on Eliquis - Patient remains in Afib RVR with rates 100-120s - Asymptomatic - check TSH - K. 4.5 Goal >4  - Mag 1.9 on 3/21 goal >2. Will recheck - Will increase diltiazem for better rate control. Pressures stable - Would plan for TEE/DCCV Thursday  Drug reaction with systemic symptoms - from allopurinol - per primary  AKI - creatinine on admission >7 - creatinine has been improving, 3.19>2.95 . AM labs pending  Elevated LFTs - likely related to drug rxn  CAD - cath in 2014 with 60-70% calcified left main lesion, high grade disease in OM which was medically managed - Echo 06/01/19 with EF 55-60% - No chest pain  H/o of cardiomyopathy with improved EF - CM in the setting of sepsis - EF 55-60% - Diuretics held due to elevated creatinine. Will likely need some diuresis once creatinine is stable  Hypertension - coreg and dilt, BP today 141/94 - ARB and lasix held in the setting of elevated creatinine  HLD - Lipitor  For questions or updates, please contact [place] Please consult [email or web address] for contact info under        Signed, Cadence Ninfa Meeker, PA-C  06/06/2019, 7:07 AM    History and all data above reviewed.  Patient examined.  I agree with the findings as above.  No pain.  Breathing OKThe patient exam reveals WVP:[id number]  ,  Lungs: Clear  ,  Abd:  Positive bowel sounds, no rebound no guarding, Ext Moderate edema  .  All available labs, radiology testing, previous records reviewed. Agree with documented assessment and plan.   Atrial fib:  Rate is better.  Increase Cardizem.  If rate OK will plan DCCV in 3 or 4 weeks.  If not controlled in the AM I will go ahead with TEE/DCCV.   Jeneen Rinks Alyene Predmore  11:57 AM  06/06/2019

## 2019-06-06 NOTE — Progress Notes (Signed)
PROGRESS NOTE    Zachary Parrish  WHQ:[id number] DOB: 1940-05-06 DOA: 05/31/2019 PCP: Verl Bangs, MD   Brief Narrative:  79 year old with history of CAD, dilated cardiomyopathy, DM2, HLD, HTN treated for gout recently as outpatient with colchicine and allopurinol.  Unfortunately developed hypotension and diffuse rash concerns of her DRESS.  Initially was on Levophed and supportive care.  Also had AKI which started improving.  Hospital course complicated by atrial fibrillation with RVR therefore cardiology team consulted planning for cardioversion.   Assessment & Plan:   Active Problems:   Shock (HCC)   Acute renal failure (ARF) (HCC)   Abnormal transaminases   Rash   Dress syndrome with acute kidney injury and transaminitis -Currently appears to be improving.  Suspect secondary to allopurinol or colchicine for his gout.  At this time continue prednisone.  This can be slowly tapered over the next 2-3 months.  Supportive care. -Creatinine and transaminitis improving.  Creatinine today 2.73. -We will need to follow-up outpatient nephrology early next week with repeat lab work.  Atrial fibrillation with frequent RVR -Currently patient is on Eliquis, cardiology team following.  Plans for cardioversion tomorrow.  Continue Coreg and Cardizem  Diabetes mellitus type 2 -Hemoglobin A1c 7.2.  Continue insulin sliding scale -Add Lantus 8 units daily   DVT prophylaxis: Eliquis Code Status: Full code Family Communication: Wife at bedside Disposition Plan:   Patient From= home  Patient Anticipated D/C place= likely home with home health  Barriers= maintain hospital stay for cardioversion tomorrow.   Subjective: Patient is still in atrial fibrillation with RVR heart rate ranging from 110s-130.  But overall no other complaints  Review of Systems Otherwise negative except as per HPI, including: General: Denies fever, chills, night sweats or unintended weight loss. Resp: Denies  cough, wheezing, shortness of breath. Cardiac: Denies chest pain, palpitations, orthopnea, paroxysmal nocturnal dyspnea. GI: Denies abdominal pain, nausea, vomiting, diarrhea or constipation GU: Denies dysuria, frequency, hesitancy or incontinence MS: Denies muscle aches, joint pain or swelling Neuro: Denies headache, neurologic deficits (focal weakness, numbness, tingling), abnormal gait Psych: Denies anxiety, depression, SI/HI/AVH Skin: Denies new rashes or lesions ID: Denies sick contacts, exotic exposures, travel  Examination:  General exam: Appears calm and comfortable  Respiratory system: Clear to auscultation. Respiratory effort normal. Cardiovascular system: S1 & S2 heard, irregularly irregular rhythm. No JVD, murmurs, rubs, gallops or clicks. No pedal edema. Gastrointestinal system: Abdomen is nondistended, soft and nontender. No organomegaly or masses felt. Normal bowel sounds heard. Central nervous system: Alert and oriented. No focal neurological deficits. Extremities: Symmetric 5 x 5 power. Skin: Diffuse skin erythema but greatly improved.  Some sloughing dry skin which is improving as well. Psychiatry: Judgement and insight appear normal. Mood & affect appropriate.     Objective: Vitals:   06/05/19 2008 06/05/19 2119 06/06/19 0500 06/06/19 0550  BP: (!) 158/90   (!) 141/94  Pulse: (!) 140 (!) 107  87  Resp:  18  18  Temp: (!) 97.5 F (36.4 C)   98 F (36.7 C)  TempSrc: Oral   Oral  SpO2: 95% 93%  92%  Weight:   99.8 kg   Height:        Intake/Output Summary (Last 24 hours) at 06/06/2019 1057 Last data filed at 06/06/2019 0730 Gross per 24 hour  Intake 240 ml  Output 225 ml  Net 15 ml   Filed Weights   06/04/19 0601 06/05/19 0323 06/06/19 0500  Weight: 105.6 kg 107.4 kg 99.8 kg  Data Reviewed:   CBC: Recent Labs  Lab 06/02/19 0459 06/03/19 0340 06/04/19 0554 06/05/19 0331 06/06/19 0413  WBC 7.6 6.9 7.2 6.8 6.1  NEUTROABS 4.2 5.3 4.4 4.4 4.2    HGB 8.3* 7.7* 8.0* 8.7* 8.6*  HCT 25.2* 23.1* 24.0* 26.5* 26.1*  MCV 96.6 96.7 97.2 97.1 98.9  PLT 115* 135* 138* 146* 138*   Basic Metabolic Panel: Recent Labs  Lab 05/31/19 2356 05/31/19 2356 06/01/19 0335 06/01/19 1230 06/03/19 1140 06/03/19 2119 06/04/19 0554 06/05/19 0331 06/06/19 0413 06/06/19 0724  NA 138   < > 139   < > 141 141 140 142 142  --   K 5.2*   < > 5.1   < > 4.6 4.2 4.7 4.5 4.8  --   CL 110   < > 109   < > 104 103 103 104 103  --   CO2 14*   < > 16*   < > 23 23 27 30 29   --   GLUCOSE 186*   < > 176*   < > 198* 237* 223* 292* 237*  --   BUN 121*   < > 115*   < > 94* 85* 84* 73* 65*  --   CREATININE 6.49*   < > 6.09*   < > 3.61* 3.32* 3.19* 2.95* 2.73*  --   CALCIUM 7.2*   < > 7.3*   < > 7.2* 7.4* 7.3* 7.7* 7.9*  --   MG 1.7  --  1.8  --   --  1.9  --   --   --  2.2  PHOS 6.0*  --  6.1*  --   --   --   --   --   --   --    < > = values in this interval not displayed.   GFR: Estimated Creatinine Clearance: 28.2 mL/min (A) (by C-G formula based on SCr of 2.73 mg/dL (H)). Liver Function Tests: Recent Labs  Lab 06/02/19 0459 06/02/19 1625 06/03/19 0340 06/03/19 2119 06/06/19 0413  AST 96* 102* 99* 112* 107*  ALT 113* 113* 113* 128* 161*  ALKPHOS 193* 191* 176* 171* 142*  BILITOT 2.1* 2.2* 1.7* 2.0* 2.7*  PROT 5.4* 5.3* 5.1* 5.3* 5.2*  ALBUMIN 2.5* 2.4* 2.4* 2.4* 2.5*   Recent Labs  Lab 05/31/19 2356  LIPASE 100*  AMYLASE 51   No results for input(s): AMMONIA in the last 168 hours. Coagulation Profile: Recent Labs  Lab 05/31/19 2356  INR 1.3*   Cardiac Enzymes: No results for input(s): CKTOTAL, CKMB, CKMBINDEX, TROPONINI in the last 168 hours. BNP (last 3 results) No results for input(s): PROBNP in the last 8760 hours. HbA1C: No results for input(s): HGBA1C in the last 72 hours. CBG: Recent Labs  Lab 06/05/19 0755 06/05/19 1224 06/05/19 1605 06/05/19 2109 06/06/19 0816  GLUCAP 229* 196* 241* 249* 223*   Lipid Profile: No  results for input(s): CHOL, HDL, LDLCALC, TRIG, CHOLHDL, LDLDIRECT in the last 72 hours. Thyroid Function Tests: Recent Labs    06/06/19 0724  TSH 2.346   Anemia Panel: No results for input(s): VITAMINB12, FOLATE, FERRITIN, TIBC, IRON, RETICCTPCT in the last 72 hours. Sepsis Labs: Recent Labs  Lab 05/31/19 2356  PROCALCITON 0.42  LATICACIDVEN 1.3    Recent Results (from the past 240 hour(s))  MRSA PCR Screening     Status: None   Collection Time: 05/31/19 11:56 PM   Specimen: Nasal Mucosa; Nasopharyngeal  Result Value Ref Range Status  MRSA by PCR NEGATIVE NEGATIVE Final    Comment:        The GeneXpert MRSA Assay (FDA approved for NASAL specimens only), is one component of a comprehensive MRSA colonization surveillance program. It is not intended to diagnose MRSA infection nor to guide or monitor treatment for MRSA infections. Performed at [facility], [place] [address] [place], [place] [address]   Culture, blood (Routine X 2) w Reflex to ID Panel     Status: None   Collection Time: 06/01/19 12:22 AM   Specimen: BLOOD  Result Value Ref Range Status   Specimen Description BLOOD LEFT ANTECUBITAL  Final   Special Requests   Final    BOTTLES DRAWN AEROBIC AND ANAEROBIC Blood Culture adequate volume   Culture   Final    NO GROWTH 5 DAYS Performed at [facility], [place] [address] [place], [place] [address]    Report Status 06/06/2019 FINAL  Final  Culture, blood (Routine X 2) w Reflex to ID Panel     Status: None   Collection Time: 06/01/19 12:28 AM   Specimen: BLOOD LEFT HAND  Result Value Ref Range Status   Specimen Description BLOOD LEFT HAND  Final   Special Requests   Final    BOTTLES DRAWN AEROBIC ONLY Blood Culture adequate volume   Culture   Final    NO GROWTH 5 DAYS Performed at [facility], [place] [address], [place], [place] [address]    Report Status 06/06/2019 FINAL  Final         Radiology Studies: VAS [place] UPPER EXTREMITY  VENOUS DUPLEX  Result Date: 06/05/2019 UPPER VENOUS STUDY  Indications: Swelling Comparison Study: no prior Performing Technologist: Abram Sander RVS  Examination Guidelines: A complete evaluation includes B-mode imaging, spectral Doppler, color Doppler, and power Doppler as needed of all accessible portions of each vessel. Bilateral testing is considered an integral part of a complete examination. Limited examinations for reoccurring indications may be performed as noted.  Left Findings: +----------+------------+---------+-----------+----------+-------+ LEFT      CompressiblePhasicitySpontaneousPropertiesSummary +----------+------------+---------+-----------+----------+-------+ IJV           Full       Yes       Yes                      +----------+------------+---------+-----------+----------+-------+ Subclavian    Full       Yes       Yes                      +----------+------------+---------+-----------+----------+-------+ Axillary      Full       Yes       Yes                      +----------+------------+---------+-----------+----------+-------+ Brachial      Full       Yes       Yes                      +----------+------------+---------+-----------+----------+-------+ Radial        Full                                          +----------+------------+---------+-----------+----------+-------+ Ulnar         Full                                          +----------+------------+---------+-----------+----------+-------+  Cephalic      Full                                          +----------+------------+---------+-----------+----------+-------+ Basilic       Full                                          +----------+------------+---------+-----------+----------+-------+  Summary:  Left: No evidence of deep vein thrombosis in the upper extremity. No evidence of superficial vein thrombosis in the upper extremity.  *See table(s) above for  measurements and observations.  Diagnosing physician: Waverly Ferrari MD Electronically signed by Waverly Ferrari MD on 06/05/2019 at 1:07:52 PM.    Final         Scheduled Meds: . apixaban  5 mg Oral BID  . carvedilol  25 mg Oral BID  . diltiazem  180 mg Oral Daily  . famotidine  20 mg Oral Daily  . insulin aspart  0-5 Units Subcutaneous QHS  . insulin aspart  0-9 Units Subcutaneous TID WC  . insulin glargine  8 Units Subcutaneous Daily  . predniSONE  50 mg Oral Q breakfast   Continuous Infusions: . sodium chloride Stopped (06/01/19 1341)     LOS: 6 days   Time spent= 25 mins    Rahmel Nedved Joline Maxcy, MD [facility]  If 7PM-7AM, please contact night-coverage  06/06/2019, 10:57 AM

## 2019-06-06 NOTE — Anesthesia Preprocedure Evaluation (Addendum)
Anesthesia Evaluation  Patient identified by MRN, date of birth, ID band Patient awake    Reviewed: Allergy & Precautions, NPO status , Patient's Chart, lab work & pertinent test results  Airway Mallampati: II  TM Distance: >3 FB Neck ROM: Full    Dental no notable dental hx. (+) Edentulous Upper, Edentulous Lower   Pulmonary former smoker,    Pulmonary exam normal breath sounds clear to auscultation       Cardiovascular hypertension, Pt. on home beta blockers and Pt. on medications Normal cardiovascular exam+ dysrhythmias Atrial Fibrillation  Rhythm:Irregular Rate:Normal  Dialated cardiomyopathy Echo 06/01/19 1. Left ventricular ejection fraction, by estimation, is 55 to 60%. The  left ventricle has normal function. The left ventricle has no regional  wall motion abnormalities. There is moderate asymmetric left ventricular  hypertrophy of the basal-septal  segment. LV diastolic function is grossly normal. Suboptimal assessment of    Neuro/Psych negative neurological ROS  negative psych ROS   GI/Hepatic   Endo/Other  diabetes, Type 2, Insulin Dependent  Renal/GU ARFRenal diseaseK+ 4.8     Musculoskeletal negative musculoskeletal ROS (+)   Abdominal   Peds  Hematology  (+) anemia , Hgb 8.6   Anesthesia Other Findings   Reproductive/Obstetrics negative OB ROS                            Anesthesia Physical Anesthesia Plan  ASA: III  Anesthesia Plan: MAC   Post-op Pain Management:    Induction: Intravenous  PONV Risk Score and Plan: Treatment may vary due to age or medical condition  Airway Management Planned: Nasal Cannula and Natural Airway  Additional Equipment: None  Intra-op Plan:   Post-operative Plan:   Informed Consent:     Dental advisory given  Plan Discussed with:   Anesthesia Plan Comments:         Anesthesia Quick Evaluation

## 2019-06-06 NOTE — Plan of Care (Signed)
  Problem: Clinical Measurements: Goal: Ability to maintain clinical measurements within normal limits will improve Outcome: Progressing Goal: Diagnostic test results will improve Outcome: Progressing   Problem: Nutrition: Goal: Adequate nutrition will be maintained Outcome: Completed/Met

## 2019-06-06 NOTE — Progress Notes (Addendum)
 Progress Note  Patient Name: Zachary Parrish Date of Encounter: 06/06/2019  Primary Cardiologist: Rainn Bullinger, MD   Subjective   Patient remains in afib RVR, rates 100-120. He is asymptomatic. Feels very anxious since he is feeling confined.   Inpatient Medications    Scheduled Meds: . apixaban  5 mg Oral BID  . carvedilol  25 mg Oral BID  . diltiazem  120 mg Oral Daily  . famotidine  20 mg Oral Daily  . insulin aspart  0-5 Units Subcutaneous QHS  . insulin aspart  0-9 Units Subcutaneous TID WC  . predniSONE  50 mg Oral Q breakfast   Continuous Infusions: . sodium chloride Stopped (06/01/19 1341)   PRN Meds: sodium chloride, diphenhydrAMINE, hydrocerin   Vital Signs    Vitals:   06/05/19 2008 06/05/19 2119 06/06/19 0500 06/06/19 0550  BP: (!) 158/90   (!) 141/94  Pulse: (!) 140 (!) 107  87  Resp:  18  18  Temp: (!) 97.5 F (36.4 C)   98 F (36.7 C)  TempSrc: Oral   Oral  SpO2: 95% 93%  92%  Weight:   99.8 kg   Height:        Intake/Output Summary (Last 24 hours) at 06/06/2019 0707 Last data filed at 06/05/2019 1610 Gross per 24 hour  Intake -  Output 225 ml  Net -225 ml   Last 3 Weights 06/06/2019 06/05/2019 06/04/2019  Weight (lbs) 220 lb 0.3 oz 236 lb 11.2 oz 232 lb 12.8 oz  Weight (kg) 99.8 kg 107.366 kg 105.597 kg      Telemetry    Afib, HR 100-120s, around 100 overnight; rare PVCS - Personally Reviewed  ECG    No new - Personally Reviewed  Physical Exam   GEN: No acute distress.   Neck: No JVD Cardiac: Irreg Irreg, no murmurs, rubs, or gallops.  Respiratory: Clear to auscultation bilaterally. GI: Soft, nontender, non-distended  MS: 1+ lower leg edema; No deformity. Neuro:  Nonfocal  Psych: Normal affect   Labs    High Sensitivity Troponin:   Recent Labs  Lab 05/31/19 2356 06/01/19 0335  TROPONINIHS 17 18*      Chemistry Recent Labs  Lab 06/02/19 1625 06/02/19 1625 06/03/19 0340 06/03/19 1140 06/03/19 2119 06/04/19 0554  06/05/19 0331  NA 141   < > 140   < > 141 140 142  K 4.8   < > 4.3   < > 4.2 4.7 4.5  CL 109   < > 103   < > 103 103 104  CO2 22   < > 25   < > 23 27 30  GLUCOSE 241*   < > 236*   < > 237* 223* 292*  BUN 104*   < > 98*   < > 85* 84* 73*  CREATININE 4.41*   < > 3.96*   < > 3.32* 3.19* 2.95*  CALCIUM 7.4*   < > 7.3*   < > 7.4* 7.3* 7.7*  PROT 5.3*  --  5.1*  --  5.3*  --   --   ALBUMIN 2.4*  --  2.4*  --  2.4*  --   --   AST 102*  --  99*  --  112*  --   --   ALT 113*  --  113*  --  128*  --   --   ALKPHOS 191*  --  176*  --  171*  --   --     BILITOT 2.2*  --  1.7*  --  2.0*  --   --   GFRNONAA 12*   < > 14*   < > 17* 18* 20*  GFRAA 14*   < > 16*   < > 20* 21* 23*  ANIONGAP 10   < > 12   < > 15 10 8   < > = values in this interval not displayed.     Hematology Recent Labs  Lab 06/04/19 0554 06/05/19 0331 06/06/19 0413  WBC 7.2 6.8 6.1  RBC 2.47* 2.73* 2.64*  HGB 8.0* 8.7* 8.6*  HCT 24.0* 26.5* 26.1*  MCV 97.2 97.1 98.9  MCH 32.4 31.9 32.6  MCHC 33.3 32.8 33.0  RDW 17.7* 18.1* 18.9*  PLT 138* 146* 138*    BNPNo results for input(s): BNP, PROBNP in the last 168 hours.   DDimer No results for input(s): DDIMER in the last 168 hours.   Radiology    VAS US UPPER EXTREMITY VENOUS DUPLEX  Result Date: 06/05/2019 UPPER VENOUS STUDY  Indications: Swelling Comparison Study: no prior Performing Technologist: Megan Riddle RVS  Examination Guidelines: A complete evaluation includes B-mode imaging, spectral Doppler, color Doppler, and power Doppler as needed of all accessible portions of each vessel. Bilateral testing is considered an integral part of a complete examination. Limited examinations for reoccurring indications may be performed as noted.  Left Findings: +----------+------------+---------+-----------+----------+-------+ LEFT      CompressiblePhasicitySpontaneousPropertiesSummary +----------+------------+---------+-----------+----------+-------+ IJV           Full        Yes       Yes                      +----------+------------+---------+-----------+----------+-------+ Subclavian    Full       Yes       Yes                      +----------+------------+---------+-----------+----------+-------+ Axillary      Full       Yes       Yes                      +----------+------------+---------+-----------+----------+-------+ Brachial      Full       Yes       Yes                      +----------+------------+---------+-----------+----------+-------+ Radial        Full                                          +----------+------------+---------+-----------+----------+-------+ Ulnar         Full                                          +----------+------------+---------+-----------+----------+-------+ Cephalic      Full                                          +----------+------------+---------+-----------+----------+-------+ Basilic       Full                                          +----------+------------+---------+-----------+----------+-------+    Summary:  Left: No evidence of deep vein thrombosis in the upper extremity. No evidence of superficial vein thrombosis in the upper extremity.  *See table(s) above for measurements and observations.  Diagnosing physician: Deitra Mayo MD Electronically signed by Deitra Mayo MD on 06/05/2019 at 1:07:52 PM.    Final     Cardiac Studies   Echo 06/01/19 1. Left ventricular ejection fraction, by estimation, is 55 to 60%. The  left ventricle has normal function. The left ventricle has no regional  wall motion abnormalities. There is moderate asymmetric left ventricular  hypertrophy of the basal-septal  segment. LV diastolic function is grossly normal. Suboptimal assessment of  medial tissue Doppler for assessment of LV filling pressure.  2. Right ventricular systolic function is normal. The right ventricular  size is normal. Tricuspid regurgitation signal is  inadequate for assessing  PA pressure.  3. The mitral valve is normal in structure. Trivial mitral valve  regurgitation. No evidence of mitral stenosis.  4. The aortic valve is mildly calcified. Aortic valve regurgitation is  not visualized. No aortic stenosis is present.   Patient Profile     79 y.o. male with pmh of CAD, h/o of dilated CM with improved EF, HTN, HLD, and DM2 who is being seen for the evaluation of afib RVR.  Assessment & Plan    Paroxysmal Afib with RVR - New onset this admission - Home carvedilol was restarted, coreg 25mg  BID - Dilt 120mg  started as well - CHADSVASC 6 and started on Eliquis - Patient remains in Afib RVR with rates 100-120s - Asymptomatic - check TSH - K. 4.5 Goal >4  - Mag 1.9 on 3/21 goal >2. Will recheck - Will increase diltiazem for better rate control. Pressures stable - Would plan for TEE/DCCV Thursday  Drug reaction with systemic symptoms - from allopurinol - per primary  AKI - creatinine on admission >7 - creatinine has been improving, 3.19>2.95 . AM labs pending  Elevated LFTs - likely related to drug rxn  CAD - cath in 2014 with 60-70% calcified left main lesion, high grade disease in OM which was medically managed - Echo 06/01/19 with EF 55-60% - No chest pain  H/o of cardiomyopathy with improved EF - CM in the setting of sepsis - EF 55-60% - Diuretics held due to elevated creatinine. Will likely need some diuresis once creatinine is stable  Hypertension - coreg and dilt, BP today 141/94 - ARB and lasix held in the setting of elevated creatinine  HLD - Lipitor  For questions or updates, please contact [facility] Please consult [email or web address] for contact info under        Signed, Cadence Ninfa Meeker, PA-C  06/06/2019, 7:07 AM    History and all data above reviewed.  Patient examined.  I agree with the findings as above.  No pain.  Breathing OKThe patient exam reveals WVP:[id number]  ,  Lungs: Clear  ,  Abd:  Positive bowel sounds, no rebound no guarding, Ext Moderate edema  .  All available labs, radiology testing, previous records reviewed. Agree with documented assessment and plan.   Atrial fib:  Rate is better.  Increase Cardizem.  If rate OK will plan DCCV in 3 or 4 weeks.  If not controlled in the AM I will go ahead with TEE/DCCV.   Jeneen Rinks Toy Samarin  11:57 AM  06/06/2019

## 2019-06-06 NOTE — Progress Notes (Signed)
RT offered pt CPAP dream station for the night and pt declined. Pt attempted to wear first night for about 10 minutes and then asked RT to remove d/t claustrophobia. Pt wearing O2 nightly and doing well. Pt respiratory status is stable at this time. RT will dc order and continue to monitor.

## 2019-06-06 NOTE — Progress Notes (Signed)
Physical Therapy Treatment Patient Details Name: Zachary Parrish MRN: [id number] DOB: 09-11-1940 Today's Date: 06/06/2019    History of Present Illness 79 year old male with a history of diabetes mellitus type 2, gout, hypertension admitted with rash, presented multiorgan involvement/dress syndrome secondary to allopurinol or colchicine.  Also presented with acute kidney injury and dehydration.    PT Comments    Pt doing well with mobility today, states had a lazy day and did not attempt much prior to tx. Was able to ambulate approx 234ft x 2 with seated rest between. Pt needed SBA w/ ambulation noted some LOBs but pt able to self correct of hang on to side rail. Pt was on room air throughout session and min desat noted was 89% and max HR 114bpm. Will continue to follow pt acutely and progress as he tolerates. Pt will still benefit from home health PT at dc.     Follow Up Recommendations  Home health PT     Equipment Recommendations  None recommended by PT    Recommendations for Other Services       Precautions / Restrictions Precautions Precautions: Fall Restrictions Weight Bearing Restrictions: No    Mobility  Bed Mobility Overal bed mobility: Modified Independent                Transfers Overall transfer level: Modified independent               General transfer comment: able to trasnfer from bed/chair and commode with mod I  Ambulation/Gait Ambulation/Gait assistance: Supervision Gait Distance (Feet): 200 Feet Assistive device: None Gait Pattern/deviations: Step-through pattern;Decreased stride length;Wide base of support;Trunk flexed Gait velocity: dec   General Gait Details: some minor LOBs noted able to self correct and hang on to side rail when needed   Stairs             Wheelchair Mobility    Modified Rankin (Stroke Patients Only)       Balance Overall balance assessment: Mild deficits observed, not formally tested   Sitting  balance-Leahy Scale: Good       Standing balance-Leahy Scale: Fair                              Cognition Arousal/Alertness: Awake/alert Behavior During Therapy: WFL for tasks assessed/performed Overall Cognitive Status: Within Functional Limits for tasks assessed                                        Exercises Other Exercises Other Exercises: completed some seated ther ex but not consistently    General Comments General comments (skin integrity, edema, etc.): on room air today and sats min 89% aftre ambulation approx 214ft HR max 114bpm after ambulation       Pertinent Vitals/Pain Pain Assessment: No/denies pain    Home Living                      Prior Function            PT Goals (current goals can now be found in the care plan section) Acute Rehab PT Goals Patient Stated Goal: to improve stamina, strength PT Goal Formulation: With patient/family Time For Goal Achievement: 06/18/19 Potential to Achieve Goals: Good Progress towards PT goals: Progressing toward goals    Frequency    Min 3X/week  PT Plan Current plan remains appropriate    Co-evaluation              AM-PAC PT "6 Clicks" Mobility   Outcome Measure  Help needed turning from your back to your side while in a flat bed without using bedrails?: None Help needed moving from lying on your back to sitting on the side of a flat bed without using bedrails?: None Help needed moving to and from a bed to a chair (including a wheelchair)?: None Help needed standing up from a chair using your arms (e.g., wheelchair or bedside chair)?: A Little Help needed to walk in hospital room?: A Little Help needed climbing 3-5 steps with a railing? : A Little 6 Click Score: 21    End of Session   Activity Tolerance: Patient limited by fatigue;Patient tolerated treatment well Patient left: in bed;with call bell/phone within reach Nurse Communication: Mobility  status PT Visit Diagnosis: Other abnormalities of gait and mobility (R26.89)     Time: 8159-4707 PT Time Calculation (min) (ACUTE ONLY): 38 min  Charges:  $Gait Training: 23-37 mins $Therapeutic Exercise: 8-22 mins                     Zachary Parrish, PT    Zachary Parrish 06/06/2019, 4:20 PM

## 2019-06-06 NOTE — Care Management Important Message (Signed)
Important Message  Patient Details  Name: Zachary Parrish MRN: [id number] Date of Birth: 1941/01/23   Medicare Important Message Given:  Yes     Renie Ora 06/06/2019, 11:52 AM

## 2019-06-06 NOTE — Progress Notes (Signed)
Inpatient Diabetes Program Recommendations  AACE/ADA: New Consensus Statement on Inpatient Glycemic Control (2015)  Target Ranges:  Prepandial:   less than 140 mg/dL      Peak postprandial:   less than 180 mg/dL (1-2 hours)      Critically ill patients:  140 - 180 mg/dL   Lab Results  Component Value Date   GLUCAP 223 (H) 06/06/2019   HGBA1C 7.2 (H) 06/01/2019    Review of Glycemic Control  Results for NORVAL, SLAVEN (MRN [id number]) as of 06/06/2019 10:37  Ref. Range 06/05/2019 07:55 06/05/2019 12:24 06/05/2019 16:05 06/05/2019 21:09 06/06/2019 08:16  Glucose-Capillary Latest Ref Range: 70 - 99 mg/dL 278 (H) 004 (H) 471 (H) 249 (H) 223 (H)   Diabetes history: Type 2 DM Outpatient Diabetes medications: none Current orders for Inpatient glycemic control: Novolog 0-9 units TID, Novolog 0-5 units QHS Prednisone 50 mg QAM  Inpatient Diabetes Program Recommendations:    Consider adding Levemir 8 units QD.   Thanks, Christena Deem RN, MSN, BC-ADM Inpatient Diabetes Coordinator Team Pager [phone] (8a-5p)

## 2019-06-07 ENCOUNTER — Inpatient Hospital Stay (HOSPITAL_COMMUNITY): Payer: Medicare Other

## 2019-06-07 ENCOUNTER — Inpatient Hospital Stay (HOSPITAL_COMMUNITY): Payer: Medicare Other | Admitting: Anesthesiology

## 2019-06-07 ENCOUNTER — Encounter (HOSPITAL_COMMUNITY): Payer: Self-pay | Admitting: Pulmonary Disease

## 2019-06-07 ENCOUNTER — Encounter (HOSPITAL_COMMUNITY)
Admission: AD | Disposition: A | Discharge status: Home Health | Payer: Self-pay | Source: Other Acute Inpatient Hospital | Attending: Family Medicine

## 2019-06-07 DIAGNOSIS — I4891 Unspecified atrial fibrillation: Secondary | ICD-10-CM

## 2019-06-07 DIAGNOSIS — I340 Nonrheumatic mitral (valve) insufficiency: Secondary | ICD-10-CM

## 2019-06-07 DIAGNOSIS — E119 Type 2 diabetes mellitus without complications: Secondary | ICD-10-CM

## 2019-06-07 HISTORY — PX: CARDIOVERSION: SHX1299

## 2019-06-07 HISTORY — PX: TEE WITHOUT CARDIOVERSION: SHX5443

## 2019-06-07 LAB — BASIC METABOLIC PANEL
Anion gap: 9 (ref 5–15)
BUN: 56 mg/dL — ABNORMAL HIGH (ref 8–23)
CO2: 29 mmol/L (ref 22–32)
Calcium: 8.1 mg/dL — ABNORMAL LOW (ref 8.9–10.3)
Chloride: 104 mmol/L (ref 98–111)
Creatinine, Ser: 2.37 mg/dL — ABNORMAL HIGH (ref 0.61–1.24)
GFR calc Af Amer: 30 mL/min — ABNORMAL LOW (ref >60)
GFR calc non Af Amer: 25 mL/min — ABNORMAL LOW (ref >60)
Glucose, Bld: 225 mg/dL — ABNORMAL HIGH (ref 70–99)
Potassium: 4.3 mmol/L (ref 3.5–5.1)
Sodium: 142 mmol/L (ref 135–145)

## 2019-06-07 LAB — CBC
HCT: 25.9 % — ABNORMAL LOW (ref 39.0–52.0)
Hemoglobin: 8.3 g/dL — ABNORMAL LOW (ref 13.0–17.0)
MCH: 31.7 pg (ref 26.0–34.0)
MCHC: 32 g/dL (ref 30.0–36.0)
MCV: 98.9 fL (ref 80.0–100.0)
Platelets: 132 10*3/uL — ABNORMAL LOW (ref 150–400)
RBC: 2.62 MIL/uL — ABNORMAL LOW (ref 4.22–5.81)
RDW: 19.6 % — ABNORMAL HIGH (ref 11.5–15.5)
WBC: 5.9 10*3/uL (ref 4.0–10.5)
nRBC: 1.2 % — ABNORMAL HIGH (ref 0.0–0.2)

## 2019-06-07 LAB — GLUCOSE, CAPILLARY
Glucose-Capillary: 195 mg/dL — ABNORMAL HIGH (ref 70–99)
Glucose-Capillary: 219 mg/dL — ABNORMAL HIGH (ref 70–99)

## 2019-06-07 LAB — MAGNESIUM: Magnesium: 2.2 mg/dL (ref 1.7–2.4)

## 2019-06-07 SURGERY — ECHOCARDIOGRAM, TRANSESOPHAGEAL
Anesthesia: Monitor Anesthesia Care

## 2019-06-07 MED ORDER — SODIUM CHLORIDE 0.9 % IV SOLN: INTRAVENOUS | Status: DC

## 2019-06-07 MED ORDER — AMLODIPINE BESYLATE 5 MG PO TABS: 5.0000 mg | ORAL_TABLET | Freq: Every day | ORAL | 0 refills | Status: DC

## 2019-06-07 MED ORDER — AMLODIPINE BESYLATE 5 MG PO TABS: 5.0000 mg | ORAL_TABLET | Freq: Every day | ORAL | Status: DC

## 2019-06-07 MED ORDER — CARVEDILOL 25 MG PO TABS: 25.0000 mg | ORAL_TABLET | Freq: Two times a day (BID) | ORAL | Status: DC

## 2019-06-07 MED ORDER — PREDNISONE 20 MG PO TABS: ORAL_TABLET | ORAL | 0 refills | Status: AC

## 2019-06-07 MED ORDER — GENERIC EXTERNAL MEDICATION
Status: DC
Start: ? — End: 2019-06-07

## 2019-06-07 MED ORDER — BASAGLAR KWIKPEN 100 UNIT/ML ~~LOC~~ SOPN: 8.0000 [IU] | PEN_INJECTOR | Freq: Every day | SUBCUTANEOUS | 0 refills | Status: DC

## 2019-06-07 MED ORDER — BLOOD GLUCOSE MONITOR KIT: PACK | 0 refills | Status: DC

## 2019-06-07 MED ORDER — PROPOFOL 500 MG/50ML IV EMUL
INTRAVENOUS | Status: DC | PRN
  Administered 2019-06-07: 100 ug/kg/min via INTRAVENOUS

## 2019-06-07 MED FILL — PENTIPS 31G X 8 MM MISC: 31G X 8 MM | 30 days supply | Qty: 100 | Fill #0

## 2019-06-07 MED FILL — predniSONE 20 MG TABS: 20 | 28 days supply | Qty: 42 | Fill #0

## 2019-06-07 MED FILL — ELIQUIS 5 MG TABLET: 5 | 30 days supply | Qty: 60 | Fill #0

## 2019-06-07 MED FILL — TRESIBA FLEXTOUCH 100 UNITS: 100 | 30 days supply | Qty: 3 | Fill #0

## 2019-06-07 MED FILL — AMLODIPINE BESYLATE 5 MG TA: 5 | 30 days supply | Qty: 30 | Fill #0

## 2019-06-07 NOTE — Interval H&P Note (Signed)
History and Physical Interval Note:  06/07/2019 8:02 AM  Zachary Parrish  has presented today for surgery, with the diagnosis of afib.  The various methods of treatment have been discussed with the patient and family. After consideration of risks, benefits and other options for treatment, the patient has consented to  Procedure(s): TRANSESOPHAGEAL ECHOCARDIOGRAM (TEE) (N/A) CARDIOVERSION (N/A) as a surgical intervention.  The patient's history has been reviewed, patient examined, no change in status, stable for surgery.  I have reviewed the patient's chart and labs.  Questions were answered to the patient's satisfaction.     Little Ishikawa

## 2019-06-07 NOTE — CV Procedure (Signed)
   TRANSESOPHAGEAL ECHOCARDIOGRAM GUIDED DIRECT CURRENT CARDIOVERSION  NAME:  Zachary Parrish   MRN: [id number] DOB:  05/19/1940   ADMIT DATE: 05/31/2019  INDICATIONS: Symptomatic atrial fibrillation PROCEDURE:   Informed consent was obtained prior to the procedure. The risks, benefits and alternatives for the procedure were discussed and the patient comprehended these risks.  Risks include, but are not limited to, cough, sore throat, vomiting, nausea, somnolence, esophageal and stomach trauma or perforation, bleeding, low blood pressure, aspiration, pneumonia, infection, trauma to the teeth and death.    After a procedural time-out, the oropharynx was anesthetized and the patient was sedated by the anesthesia service. The transesophageal probe was inserted in the esophagus and stomach without difficulty and multiple views were obtained. Anesthesia was monitored by Dr. Richardson Landry.   COMPLICATIONS:    Complications: No complications Patient tolerated procedure well.  FINDINGS:  No LAA thrombus   CARDIOVERSION:     Indications:  Symptomatic Atrial Fibrillation  Procedure Details:  Once the TEE was complete, the patient had the defibrillator pads placed in the anterior and posterior position. Once an appropriate level of sedation was confirmed, the patient was cardioverted x 1 with 200J of biphasic synchronized energy.  The patient converted to NSR, rate 50s.  There were no apparent complications.  The patient had normal neuro status and respiratory status post procedure with vitals stable as recorded elsewhere.  Adequate airway was maintained throughout and vital signs monitored per protocol.  Epifanio Lesches MD [facility]  [address], [address] [place], [place] [address] [phone]   8:54 AM

## 2019-06-07 NOTE — Progress Notes (Signed)
  Mobility Specialist Criteria Algorithm Info. Mobility Team:  HOB elevated: Activity: Ambulated in hall;Dangled on edge of bed Range of motion: Active;All extremities Level of assistance: Standby assist, set-up cues, supervision of patient - no hands on Assistive device: None;Other (Comment) (Hallway wall rails ) Minutes sitting in chair:  Minutes stood: 5 minutes Minutes ambulated: 5 minutes Distance ambulated (ft): 240 ft Mobility response: Tolerated well (HR Pre ambulation: 56, During: 83, Post ambulation: 60 ) Bed Position: Semi-fowlers (Dangle EOB)    06/07/2019 2:36 PM

## 2019-06-07 NOTE — Transfer of Care (Signed)
Immediate Anesthesia Transfer of Care Note  Patient: Zachary Parrish  Procedure(s) Performed: TRANSESOPHAGEAL ECHOCARDIOGRAM (TEE) (N/A ) CARDIOVERSION (N/A )  Patient Location: Endoscopy Unit  Anesthesia Type:MAC  Level of Consciousness: awake, alert  and oriented  Airway & Oxygen Therapy: Patient Spontanous Breathing and Patient connected to nasal cannula oxygen  Post-op Assessment: Report given to RN and Post -op Vital signs reviewed and stable  Post vital signs: Reviewed and stable  Last Vitals:  Vitals Value Taken Time  BP 113/56 06/07/19 0855  Temp 36.7 C 06/07/19 0855  Pulse 50 06/07/19 0900  Resp 15 06/07/19 0900  SpO2 96 % 06/07/19 0900  Vitals shown include unvalidated device data.  Last Pain:  Vitals:   06/07/19 0855  TempSrc: Oral  PainSc: 0-No pain      Patients Stated Pain Goal: 0 (58/30/94 0768)  Complications: No apparent anesthesia complications

## 2019-06-07 NOTE — Anesthesia Postprocedure Evaluation (Signed)
Anesthesia Post Note  Patient: Zachary Parrish  Procedure(s) Performed: TRANSESOPHAGEAL ECHOCARDIOGRAM (TEE) (N/A ) CARDIOVERSION (N/A )     Patient location during evaluation: PACU Anesthesia Type: MAC Level of consciousness: awake and alert Pain management: pain level controlled Vital Signs Assessment: post-procedure vital signs reviewed and stable Respiratory status: spontaneous breathing, nonlabored ventilation, respiratory function stable and patient connected to nasal cannula oxygen Cardiovascular status: stable and blood pressure returned to baseline Postop Assessment: no apparent nausea or vomiting Anesthetic complications: no    Last Vitals:  Vitals:   06/07/19 0900 06/07/19 0910  BP:  (!) 125/59  Pulse: (!) 50 (!) 51  Resp: 18 15  Temp:    SpO2: 95% 98%    Last Pain:  Vitals:   06/07/19 0910  TempSrc:   PainSc: 0-No pain                 Barnet Glasgow

## 2019-06-07 NOTE — Progress Notes (Signed)
Progress Note  Patient Name: Zachary Parrish Date of Encounter: 06/07/2019  Primary Cardiologist: Rollene Rotunda, MD   Subjective   DCCV today.  Feels OK.  Anxious to go home.   Inpatient Medications    Scheduled Meds: . apixaban  5 mg Oral BID  . carvedilol  25 mg Oral BID  . diltiazem  180 mg Oral Daily  . famotidine  20 mg Oral Daily  . insulin aspart  0-5 Units Subcutaneous QHS  . insulin aspart  0-9 Units Subcutaneous TID WC  . insulin glargine  8 Units Subcutaneous Daily  . predniSONE  50 mg Oral Q breakfast   Continuous Infusions: . sodium chloride 0 mL/hr at 06/01/19 1341   PRN Meds: sodium chloride, diphenhydrAMINE, hydrocerin   Vital Signs    Vitals:   06/07/19 0910 06/07/19 0929 06/07/19 1117 06/07/19 1302  BP: (!) 125/59 133/65 135/76 (!) 140/57  Pulse: (!) 51 (!) 51  (!) 54  Resp: 15 16  16   Temp:  98.5 F (36.9 C)  98 F (36.7 C)  TempSrc:  Axillary  Oral  SpO2: 98% 99%  98%  Weight:      Height:        Intake/Output Summary (Last 24 hours) at 06/07/2019 1423 Last data filed at 06/07/2019 1100 Gross per 24 hour  Intake 625 ml  Output 100 ml  Net 525 ml   Last 3 Weights 06/07/2019 06/06/2019 06/05/2019  Weight (lbs) 229 lb 12.8 oz 220 lb 0.3 oz 236 lb 11.2 oz  Weight (kg) 104.237 kg 99.8 kg 107.366 kg      Telemetry    NSR - Personally Reviewed  ECG    No new - Personally Reviewed  Physical Exam   GEN: No  acute distress.   Neck: No  JVD Cardiac: RRR, no murmurs, rubs, or gallops.  Respiratory: Clear   to auscultation bilaterally. GI: Soft, nontender, non-distended, normal bowel sounds  MS:  Mild leg edema; No deformity. Neuro:   Nonfocal  Psych: Oriented and appropriate     Labs    High Sensitivity Troponin:   Recent Labs  Lab 05/31/19 2356 06/01/19 0335  TROPONINIHS 17 18*      Chemistry Recent Labs  Lab 06/03/19 0340 06/03/19 1140 06/03/19 2119 06/04/19 0554 06/05/19 0331 06/06/19 0413 06/07/19 0321  NA 140    < > 141   < > 142 142 142  K 4.3   < > 4.2   < > 4.5 4.8 4.3  CL 103   < > 103   < > 104 103 104  CO2 25   < > 23   < > 30 29 29   GLUCOSE 236*   < > 237*   < > 292* 237* 225*  BUN 98*   < > 85*   < > 73* 65* 56*  CREATININE 3.96*   < > 3.32*   < > 2.95* 2.73* 2.37*  CALCIUM 7.3*   < > 7.4*   < > 7.7* 7.9* 8.1*  PROT 5.1*  --  5.3*  --   --  5.2*  --   ALBUMIN 2.4*  --  2.4*  --   --  2.5*  --   AST 99*  --  112*  --   --  107*  --   ALT 113*  --  128*  --   --  161*  --   ALKPHOS 176*  --  171*  --   --  142*  --   BILITOT 1.7*  --  2.0*  --   --  2.7*  --   GFRNONAA 14*   < > 17*   < > 20* 21* 25*  GFRAA 16*   < > 20*   < > 23* 25* 30*  ANIONGAP 12   < > 15   < > 8 10 9    < > = values in this interval not displayed.     Hematology Recent Labs  Lab 06/05/19 0331 06/06/19 0413 06/07/19 0321  WBC 6.8 6.1 5.9  RBC 2.73* 2.64* 2.62*  HGB 8.7* 8.6* 8.3*  HCT 26.5* 26.1* 25.9*  MCV 97.1 98.9 98.9  MCH 31.9 32.6 31.7  MCHC 32.8 33.0 32.0  RDW 18.1* 18.9* 19.6*  PLT 146* 138* 132*    BNPNo results for input(s): BNP, PROBNP in the last 168 hours.   DDimer No results for input(s): DDIMER in the last 168 hours.   Radiology    ECHO TEE  Result Date: 06/07/2019    TRANSESOPHOGEAL ECHO REPORT   Patient Name:   Zachary Parrish Date of Exam: 06/07/2019 Medical Rec #:  06/09/2019    Height:       73.0 in Accession #:    [id number]   Weight:       229.8 lb Date of Birth:  October 14, 1940     BSA:          2.282 m Patient Age:    78 years     BP:           133/65 mmHg Patient Gender: M            HR:           100 bpm. Exam Location:  Inpatient Procedure: Transesophageal Echo, Cardiac Doppler and Color Doppler Indications:     Atrial fibrillation  History:         Patient has prior history of Echocardiogram examinations, most                  recent 06/01/2019. Acute renal failure                  Shock.  Sonographer:     06/03/2019 [place] Referring Phys:  Leeroy Bock [id number] Diagnosing  Phys: Little Ishikawa MD PROCEDURE: The transesophogeal probe was passed without difficulty through the esophogus of the patient. Sedation performed by different physician. The patient developed no complications during the procedure. IMPRESSIONS  1. Left ventricular ejection fraction, by estimation, is 55 to 60%. The left ventricle has normal function. The left ventricle has no regional wall motion abnormalities. There is moderate left ventricular hypertrophy.  2. Right ventricular systolic function is normal. The right ventricular size is normal.  3. The mitral valve is normal in structure. Mild to moderate mitral valve regurgitation.  4. The aortic valve is tricuspid. Aortic valve regurgitation is not visualized.  5. Aortic dilatation noted. There is mild dilatation of the ascending aorta measuring 37 mm.  6. Resistance was felt when advancing probe into stomach, so probe was withdrawn and no transgastric views were obtained  7. Left atrial size was mildly dilated. No left atrial/left atrial appendage thrombus was detected. FINDINGS  Left Ventricle: Left ventricular ejection fraction, by estimation, is 55 to 60%. The left ventricle has normal function. The left ventricle has no regional wall motion abnormalities. The left ventricular internal cavity size was normal in size. There is  moderate left ventricular hypertrophy. Right  Ventricle: The right ventricular size is normal. Right vetricular wall thickness was not assessed. Right ventricular systolic function is normal. Left Atrium: Left atrial size was mildly dilated. No left atrial/left atrial appendage thrombus was detected. Right Atrium: Right atrial size was normal in size. Pericardium: There is no evidence of pericardial effusion. Mitral Valve: The mitral valve is normal in structure. Mild to moderate mitral valve regurgitation. Tricuspid Valve: The tricuspid valve is normal in structure. Tricuspid valve regurgitation is mild. Aortic Valve: The aortic  valve is tricuspid. Aortic valve regurgitation is not visualized. Pulmonic Valve: The pulmonic valve was not well visualized. Pulmonic valve regurgitation is not visualized. Aorta: Aortic dilatation noted. There is mild dilatation of the ascending aorta measuring 37 mm. IAS/Shunts: No atrial level shunt detected by color flow Doppler.  MR PISA:        1.57 cm MR PISA Radius: 0.50 cm Oswaldo Milian MD Electronically signed by Oswaldo Milian MD Signature Date/Time: 06/07/2019/10:25:00 AM    Final (Updated)     Cardiac Studies   Echo 06/01/19 1. Left ventricular ejection fraction, by estimation, is 55 to 60%. The  left ventricle has normal function. The left ventricle has no regional  wall motion abnormalities. There is moderate asymmetric left ventricular  hypertrophy of the basal-septal  segment. LV diastolic function is grossly normal. Suboptimal assessment of  medial tissue Doppler for assessment of LV filling pressure.  2. Right ventricular systolic function is normal. The right ventricular  size is normal. Tricuspid regurgitation signal is inadequate for assessing  PA pressure.  3. The mitral valve is normal in structure. Trivial mitral valve  regurgitation. No evidence of mitral stenosis.  4. The aortic valve is mildly calcified. Aortic valve regurgitation is  not visualized. No aortic stenosis is present.   Patient Profile     79 y.o. male with pmh of CAD, h/o of dilated CM with improved EF, HTN, HLD, and DM2 who is being seen for the evaluation of afib RVR.  Assessment & Plan    Paroxysmal Afib with RVR Status post DCCV/TEE today.    AKI Creat continues to improve.    CAD Cath in 2014 with 60-70% calcified left main lesion, high grade disease in OM which was medically managed.  Continue medical management.    H/o of cardiomyopathy with improved EF Creat currently 55%.    Hypertension BP is borderline.  LVH moderate (with moderate MR).  I will change to  Norvasc and stop Cardizem.  HR is low post cardioversion and he will likely need med titration for HTN.  Needs to follow this at home.   HLD Continue Lipitor.   For questions or updates, please contact [place] Please consult [email or web address] for contact info under        Signed, Minus Breeding, MD  06/07/2019, 2:23 PM

## 2019-06-07 NOTE — TOC Transition Note (Signed)
Transition of Care [facility]) - CM/SW Discharge Note   Patient Details  Name: Zachary Parrish MRN: [id number] Date of Birth: [birth year]  Transition of Care [facility]) CM/SW Contact:  Gala Lewandowsky, RN Phone Number: 06/07/2019, 3:38 PM   Clinical Narrative: Patient will transition home with  [facility]- agency has spoken with family. Previous Case Manager offered choice. Wife to transport home via private vehicle. No further needs from this Case Manager.   Final next level of care: Home w Home Health Services Barriers to Discharge: Barriers Resolved   Patient Goals and CMS Choice Patient states their goals for this hospitalization and ongoing recovery are:: to get better CMS Medicare.gov Compare Post Acute Care list provided to:: Patient Represenative (must comment)(Wife) Choice offered to / list presented to : Patient  Discharge Plan and Services In-house Referral: NA Discharge Planning Services: CM Consult Post Acute Care Choice: Home Health          HH Arranged: PT   Date [facility] Agency Contacted: 06/04/19 Time HH Agency Contacted: 1122 Representative spoke with at [facility] Agency: Lupita Leash    Readmission Risk Interventions No flowsheet data found.

## 2019-06-07 NOTE — Progress Notes (Signed)
  Echocardiogram Echocardiogram Transesophageal has been performed.  Zachary Parrish [name] 06/07/2019, 9:07 AM

## 2019-06-07 NOTE — Discharge Summary (Signed)
. Physician Discharge Summary  Zachary Parrish DJS:[id number] DOB: Jan 16, 1941 DOA: 05/31/2019  PCP: Jamesetta Geralds, MD  Admit date: 05/31/2019 Discharge date: 06/07/2019  Admitted From: Home Disposition:  Discharged to home  Recommendations for Outpatient Follow-up:  1. Follow up with PCP in 1 weeks 2. Please obtain BMP/CBC in one week 3. Follow up with cardiology 1 - 2 weeks  Discharge Condition: Stable  CODE STATUS: FULL   Brief/Interim Summary: 79 year old with history of CAD, dilated cardiomyopathy, DM2, HLD, HTN treated for gout recently as outpatient with colchicine and allopurinol.  Unfortunately developed hypotension and diffuse rash concerns of her DRESS.  Initially was on Levophed and supportive care.  Also had AKI which started improving.  Hospital course complicated by atrial fibrillation with RVR therefore cardiology team consulted planning for cardioversion.  3/25: S/p cardioversion. Feels better. Eager to go home. Cards has adjusted some medications. He will be on a long steroid taper for DRESS (8 - 12 weeks). I have written for the first 4 weeks. As he is reassessed by his PCP, they will be able to adjust the tapering time. Will have lantus during this time d/t his DM. Will need to follow up with cardiology as scheduled. Follow up with PCP in 1 week for labs and to monitor steroid taper.  Discharge Diagnoses:  Active Problems:   Shock ([place])   Acute renal failure (ARF) (HCC)   Abnormal transaminases   Rash   Atrial fibrillation (HCC)  Dress syndrome with acute kidney injury and transaminitis     - Currently appears to be improving.  Suspect secondary to allopurinol or colchicine for his gout.  At this time continue prednisone.  This can be slowly tapered over the next 2-3 months.  Supportive care.     - Creatinine and transaminitis improving.  Creatinine today 2.73.     - We will need to follow-up PCP early next week with repeat lab work and possible referral to outpt  nephro     - his steroid taper will go over 8 - 12 weeks; will start with 40 mg daily for 2 weeks, then 20 mg daily for 2 weeks. Will continue tapering with PCP.   Atrial fibrillation with frequent RVR     - Currently patient is on Eliquis, cardiology team following.  Plans for cardioversion tomorrow.  Continue Coreg and Cardizem     - s/p cardioversion; cardizem now stopped, norvasc 5 started, HR parameters placed on coreg (hold for HR less than 50)  Diabetes mellitus type 2     - Hemoglobin A1c 7.2.  Continue insulin sliding scale     - Lantus 8 units daily; tolerating, will send home on this dose; follow up with PCP  Discharge Instructions   Allergies as of 06/07/2019      Reactions   Allopurinol    Possible DRESS syndrome   Colchicine    Possible DRESS syndrome      Medication List    STOP taking these medications   furosemide 40 MG tablet Commonly known as: LASIX   spironolactone 25 MG tablet Commonly known as: ALDACTONE   telmisartan 80 MG tablet Commonly known as: MICARDIS     TAKE these medications   amLODipine 5 MG tablet Commonly known as: NORVASC Take 1 tablet (5 mg total) by mouth daily.   apixaban 5 MG Tabs tablet Commonly known as: ELIQUIS Take 1 tablet (5 mg total) by mouth 2 (two) times daily.   aspirin 81 MG chewable tablet Chew  81 mg by mouth daily.   atorvastatin 40 MG tablet Commonly known as: LIPITOR Take 40 mg by mouth daily.   Basaglar KwikPen 100 UNIT/ML Inject 0.08 mLs (8 Units total) into the skin daily.   blood glucose meter kit and supplies Kit Dispense based on patient and insurance preference. Use up to four times daily as directed. (FOR ICD-9 250.00, 250.01).   carvedilol 25 MG tablet Commonly known as: COREG Take 1 tablet (25 mg total) by mouth 2 (two) times daily. Hold dose if heart rate is less than 50 What changed: additional instructions   Coenzyme Q10 300 MG Caps Take 1 capsule by mouth daily.   cyanocobalamin 1000  MCG tablet Take 1 tablet by mouth daily.   Fish Oil 1000 MG Caps Take 2 capsules by mouth daily.   Klor-Con M20 20 MEQ tablet Generic drug: potassium chloride SA Take 10 mEq by mouth daily.   predniSONE 20 MG tablet Commonly known as: DELTASONE Take 2 tablets (40 mg total) by mouth daily with breakfast for 14 days, THEN 1 tablet (20 mg total) daily with breakfast for 14 days. Start taking on: June 07, 2019       Allergies  Allergen Reactions  . Allopurinol     Possible DRESS syndrome  . Colchicine     Possible DRESS syndrome    Consultations:  Cardiology   Procedures/Studies: US RENAL  Result Date: 06/03/2019 CLINICAL DATA:  Elevated BUN and creatinine. EXAM: RENAL / URINARY TRACT ULTRASOUND COMPLETE COMPARISON:  None. FINDINGS: Right Kidney: Renal measurements: 10.1 x 6.0 x 5.4 cm = volume: 169.0 mL . Echogenicity within normal limits. No mass or hydronephrosis visualized. Left Kidney: Renal measurements: 11.4 x 4.8 x 5.3 cm = volume: 149.9 mL. Contains a small cyst in the lower pole. Bladder: The bladder is poorly distended limiting evaluation. The wall appears prominent but this could be due to lack of distention. Other: None. IMPRESSION: 1. The kidneys are normal in appearance. There is a small cyst in the lower pole on the left. 2. Poor evaluation of the bladder due to lack of distention. Bladder wall prominence is nonspecific but may be due to lack of distention. Electronically Signed   By: Dorise Bullion III M.D   On: 06/03/2019 05:07   DG Chest Port 1 View  Result Date: 05/31/2019 CLINICAL DATA:  Dyspnea, shortness of breath, cough EXAM: PORTABLE CHEST 1 VIEW COMPARISON:  None. FINDINGS: Right IJ approach central venous catheter tip terminates near the superior cavoatrial junction. There is mild cardiomegaly though this may be accentuated by portable technique. Calcified tortuous aorta. Low lung volumes with streaky opacities in the bases and retrocardiac space  favoring atelectatic change. No pneumothorax. No effusion. Vascular calcium noted in the axilla. Telemetry leads overlie the chest. No acute osseous or soft tissue abnormality. IMPRESSION: Low volumes and basilar atelectasis. Central venous catheter tip at the superior cavoatrial junction. Mild cardiomegaly. Aortic Atherosclerosis (ICD10-I70.0). Electronically Signed   By: Lovena Le M.D.   On: 05/31/2019 23:28   ECHOCARDIOGRAM COMPLETE  Result Date: 06/01/2019    ECHOCARDIOGRAM REPORT   Patient Name:   Zachary Parrish Date of Exam: 06/01/2019 Medical Rec #:  [id number]    Height:       73.0 in Accession #:    [id number]   Weight:       220.9 lb Date of Birth:  03-28-1940     BSA:          2.244 m Patient Age:  78 years     BP:           108/70 mmHg Patient Gender: M            HR:           70 bpm. Exam Location:  Inpatient Procedure: 2D Echo, Cardiac Doppler and Color Doppler Indications:   Shock  History:       Patient has no prior history of Echocardiogram examinations.                Shock.  Sonographer:   Vickie Epley RDCS Referring      [place] Phys: IMPRESSIONS  1. Left ventricular ejection fraction, by estimation, is 55 to 60%. The left ventricle has normal function. The left ventricle has no regional wall motion abnormalities. There is moderate asymmetric left ventricular hypertrophy of the basal-septal segment. LV diastolic function is grossly normal. Suboptimal assessment of medial tissue Doppler for assessment of LV filling pressure.  2. Right ventricular systolic function is normal. The right ventricular size is normal. Tricuspid regurgitation signal is inadequate for assessing PA pressure.  3. The mitral valve is normal in structure. Trivial mitral valve regurgitation. No evidence of mitral stenosis.  4. The aortic valve is mildly calcified. Aortic valve regurgitation is not visualized. No aortic stenosis is present. FINDINGS  Left Ventricle: Left ventricular ejection fraction, by  estimation, is 55 to 60%. The left ventricle has normal function. The left ventricle has no regional wall motion abnormalities. The left ventricular internal cavity size was normal in size. There is  moderate asymmetric left ventricular hypertrophy of the basal-septal segment. LV diastolic function is grossly normal. Suboptimal assessment of medial tissue Doppler for assessment of LV filling pressure. Right Ventricle: The right ventricular size is normal. No increase in right ventricular wall thickness. Right ventricular systolic function is normal. Tricuspid regurgitation signal is inadequate for assessing PA pressure. Left Atrium: Left atrial size was normal in size. Right Atrium: Right atrial size was normal in size. Pericardium: There is no evidence of pericardial effusion. Presence of epicaridal fat pad. Mitral Valve: The mitral valve is normal in structure. Normal mobility of the mitral valve leaflets. Trivial mitral valve regurgitation. No evidence of mitral valve stenosis. Tricuspid Valve: The tricuspid valve is normal in structure. Tricuspid valve regurgitation is trivial. No evidence of tricuspid stenosis. Aortic Valve: The aortic valve is grossly normal. Aortic valve regurgitation is not visualized. No aortic stenosis is present. There is mild calcification of the aortic valve. Pulmonic Valve: The pulmonic valve was normal in structure. Pulmonic valve regurgitation is trivial. No evidence of pulmonic stenosis. Aorta: The aortic root and ascending aorta are structurally normal, with no evidence of dilitation. Measurement of 37 mm may be within normal limits when indexed to body surface area for age. Venous: The inferior vena cava was not well visualized. IAS/Shunts: No atrial level shunt detected by color flow Doppler.  LEFT VENTRICLE PLAX 2D LVIDd:         4.60 cm  Diastology LVIDs:         3.40 cm  LV e' lateral:   8.63 cm/s LV PW:         1.00 cm  LV E/e' lateral: 8.9 LV IVS:        1.40 cm  LV e'  medial:    4.95 cm/s LVOT diam:     2.00 cm  LV E/e' medial:  15.6 LV SV:  72 LV SV Index:   32 LVOT Area:     3.14 cm  RIGHT VENTRICLE RV S prime:     10.80 cm/s TAPSE (M-mode): 1.6 cm LEFT ATRIUM             Index       RIGHT ATRIUM           Index LA diam:        4.80 cm 2.14 cm/m  RA Area:     10.20 cm LA Vol (A2C):   59.9 ml 26.69 ml/m RA Volume:   21.40 ml  9.53 ml/m LA Vol (A4C):   66.2 ml 29.49 ml/m LA Biplane Vol: 66.2 ml 29.49 ml/m  AORTIC VALVE LVOT Vmax:   92.00 cm/s LVOT Vmean:  57.300 cm/s LVOT VTI:    0.230 m  AORTA Ao Root diam: 3.70 cm Ao Asc diam:  3.70 cm MITRAL VALVE MV Area (PHT): 3.08 cm    SHUNTS MV Decel Time: 246 msec    Systemic VTI:  0.23 m MV E velocity: 77.10 cm/s  Systemic Diam: 2.00 cm MV A velocity: 59.10 cm/s MV E/A ratio:  1.30 Cherlynn Kaiser MD Electronically signed by Cherlynn Kaiser MD Signature Date/Time: 06/01/2019/1:04:21 PM    Final    ECHO TEE  Result Date: 06/07/2019    TRANSESOPHOGEAL ECHO REPORT   Patient Name:   Zachary Parrish Date of Exam: 06/07/2019 Medical Rec #:  [id number]    Height:       73.0 in Accession #:    [id number]   Weight:       229.8 lb Date of Birth:  1940/05/02     BSA:          2.282 m Patient Age:    70 years     BP:           133/65 mmHg Patient Gender: M            HR:           100 bpm. Exam Location:  Inpatient Procedure: Transesophageal Echo, Cardiac Doppler and Color Doppler Indications:     Atrial fibrillation  History:         Patient has prior history of Echocardiogram examinations, most                  recent 06/01/2019. Acute renal failure                  Shock.  Sonographer:     Vikki Ports Turrentine Referring Phys:  [id number] Donato Heinz Diagnosing Phys: Oswaldo Milian MD PROCEDURE: The transesophogeal probe was passed without difficulty through the esophogus of the patient. Sedation performed by different physician. The patient developed no complications during the procedure. IMPRESSIONS  1. Left ventricular  ejection fraction, by estimation, is 55 to 60%. The left ventricle has normal function. The left ventricle has no regional wall motion abnormalities. There is moderate left ventricular hypertrophy.  2. Right ventricular systolic function is normal. The right ventricular size is normal.  3. The mitral valve is normal in structure. Mild to moderate mitral valve regurgitation.  4. The aortic valve is tricuspid. Aortic valve regurgitation is not visualized.  5. Aortic dilatation noted. There is mild dilatation of the ascending aorta measuring 37 mm.  6. Resistance was felt when advancing probe into stomach, so probe was withdrawn and no transgastric views were obtained  7. Left atrial size was mildly dilated. No left atrial/left atrial appendage thrombus  was detected. FINDINGS  Left Ventricle: Left ventricular ejection fraction, by estimation, is 55 to 60%. The left ventricle has normal function. The left ventricle has no regional wall motion abnormalities. The left ventricular internal cavity size was normal in size. There is  moderate left ventricular hypertrophy. Right Ventricle: The right ventricular size is normal. Right vetricular wall thickness was not assessed. Right ventricular systolic function is normal. Left Atrium: Left atrial size was mildly dilated. No left atrial/left atrial appendage thrombus was detected. Right Atrium: Right atrial size was normal in size. Pericardium: There is no evidence of pericardial effusion. Mitral Valve: The mitral valve is normal in structure. Mild to moderate mitral valve regurgitation. Tricuspid Valve: The tricuspid valve is normal in structure. Tricuspid valve regurgitation is mild. Aortic Valve: The aortic valve is tricuspid. Aortic valve regurgitation is not visualized. Pulmonic Valve: The pulmonic valve was not well visualized. Pulmonic valve regurgitation is not visualized. Aorta: Aortic dilatation noted. There is mild dilatation of the ascending aorta measuring 37 mm.  IAS/Shunts: No atrial level shunt detected by color flow Doppler.  MR PISA:        1.57 cm MR PISA Radius: 0.50 cm Oswaldo Milian MD Electronically signed by Oswaldo Milian MD Signature Date/Time: 06/07/2019/10:25:00 AM    Final (Updated)    VAS [place] UPPER EXTREMITY VENOUS DUPLEX  Result Date: 06/05/2019 UPPER VENOUS STUDY  Indications: Swelling Comparison Study: no prior Performing Technologist: Abram Sander RVS  Examination Guidelines: A complete evaluation includes B-mode imaging, spectral Doppler, color Doppler, and power Doppler as needed of all accessible portions of each vessel. Bilateral testing is considered an integral part of a complete examination. Limited examinations for reoccurring indications may be performed as noted.  Left Findings: +----------+------------+---------+-----------+----------+-------+ LEFT      CompressiblePhasicitySpontaneousPropertiesSummary +----------+------------+---------+-----------+----------+-------+ IJV           Full       Yes       Yes                      +----------+------------+---------+-----------+----------+-------+ Subclavian    Full       Yes       Yes                      +----------+------------+---------+-----------+----------+-------+ Axillary      Full       Yes       Yes                      +----------+------------+---------+-----------+----------+-------+ Brachial      Full       Yes       Yes                      +----------+------------+---------+-----------+----------+-------+ Radial        Full                                          +----------+------------+---------+-----------+----------+-------+ Ulnar         Full                                          +----------+------------+---------+-----------+----------+-------+ Cephalic      Full                                          +----------+------------+---------+-----------+----------+-------+  Basilic       Full                                           +----------+------------+---------+-----------+----------+-------+  Summary:  Left: No evidence of deep vein thrombosis in the upper extremity. No evidence of superficial vein thrombosis in the upper extremity.  *See table(s) above for measurements and observations.  Diagnosing physician: Deitra Mayo MD Electronically signed by Deitra Mayo MD on 06/05/2019 at 1:07:52 PM.    Final       Subjective: "You think I can go home?"  Discharge Exam: Vitals:   06/07/19 1117 06/07/19 1302  BP: 135/76 (!) 140/57  Pulse:  (!) 54  Resp:  16  Temp:  98 F (36.7 C)  SpO2:  98%   Vitals:   06/07/19 0910 06/07/19 0929 06/07/19 1117 06/07/19 1302  BP: (!) 125/59 133/65 135/76 (!) 140/57  Pulse: (!) 51 (!) 51  (!) 54  Resp: 15 16  16   Temp:  98.5 F (36.9 C)  98 F (36.7 C)  TempSrc:  Axillary  Oral  SpO2: 98% 99%  98%  Weight:      Height:        General: 79 y.o. male resting in bed in NAD Cardiovascular: RRR, +S1, S2, no m/g/r, equal pulses throughout Respiratory: CTABL, no w/r/r, normal WOB GI: BS+, NDNT, no masses noted, no organomegaly noted MSK: No e/c/c Neuro: alert to name, follows commands Psyc: Appropriate interaction and affect, calm/cooperative   The results of significant diagnostics from this hospitalization (including imaging, microbiology, ancillary and laboratory) are listed below for reference.     Microbiology: Recent Results (from the past 240 hour(s))  MRSA PCR Screening     Status: None   Collection Time: 05/31/19 11:56 PM   Specimen: Nasal Mucosa; Nasopharyngeal  Result Value Ref Range Status   MRSA by PCR NEGATIVE NEGATIVE Final    Comment:        The GeneXpert MRSA Assay (FDA approved for NASAL specimens only), is one component of a comprehensive MRSA colonization surveillance program. It is not intended to diagnose MRSA infection nor to guide or monitor treatment for MRSA infections. Performed at [facility], [place] [address] [place], [place] [address]   Culture, blood (Routine X 2) w Reflex to ID Panel     Status: None   Collection Time: 06/01/19 12:22 AM   Specimen: BLOOD  Result Value Ref Range Status   Specimen Description BLOOD LEFT ANTECUBITAL  Final   Special Requests   Final    BOTTLES DRAWN AEROBIC AND ANAEROBIC Blood Culture adequate volume   Culture   Final    NO GROWTH 5 DAYS Performed at [facility], [place] [address] [place], [place] [address]    Report Status 06/06/2019 FINAL  Final  Culture, blood (Routine X 2) w Reflex to ID Panel     Status: None   Collection Time: 06/01/19 12:28 AM   Specimen: BLOOD LEFT HAND  Result Value Ref Range Status   Specimen Description BLOOD LEFT HAND  Final   Special Requests   Final    BOTTLES DRAWN AEROBIC ONLY Blood Culture adequate volume   Culture   Final    NO GROWTH 5 DAYS Performed at [facility], [place] [address] [place], [place] [address]    Report Status 06/06/2019  FINAL  Final     Labs: BNP (last 3 results) No results for input(s): BNP in the last 8760 hours. Basic Metabolic Panel: Recent Labs  Lab 05/31/19 2356 05/31/19 2356 06/01/19 0335 06/01/19 1230 06/03/19 2119 06/04/19 0554 06/05/19 0331 06/06/19 0413 06/06/19 0724 06/07/19 0321  NA 138   < > 139   < > 141 140 142 142  --  142  K 5.2*   < > 5.1   < > 4.2 4.7 4.5 4.8  --  4.3  CL 110   < > 109   < > 103 103 104 103  --  104  CO2 14*   < > 16*   < > 23 27 30 29   --  29  GLUCOSE 186*   < > 176*   < > 237* 223* 292* 237*  --  225*  BUN 121*   < > 115*   < > 85* 84* 73* 65*  --  56*  CREATININE 6.49*   < > 6.09*   < > 3.32* 3.19* 2.95* 2.73*  --  2.37*  CALCIUM 7.2*   < > 7.3*   < > 7.4* 7.3* 7.7* 7.9*  --  8.1*  MG 1.7  --  1.8  --  1.9  --   --   --  2.2 2.2  PHOS 6.0*  --  6.1*  --   --   --   --   --   --   --    < > = values in this interval not displayed.   Liver Function Tests: Recent Labs  Lab 06/02/19 0459 06/02/19 1625  06/03/19 0340 06/03/19 2119 06/06/19 0413  AST 96* 102* 99* 112* 107*  ALT 113* 113* 113* 128* 161*  ALKPHOS 193* 191* 176* 171* 142*  BILITOT 2.1* 2.2* 1.7* 2.0* 2.7*  PROT 5.4* 5.3* 5.1* 5.3* 5.2*  ALBUMIN 2.5* 2.4* 2.4* 2.4* 2.5*   Recent Labs  Lab 05/31/19 2356  LIPASE 100*  AMYLASE 51   No results for input(s): AMMONIA in the last 168 hours. CBC: Recent Labs  Lab 06/02/19 0459 06/02/19 0459 06/03/19 0340 06/04/19 0554 06/05/19 0331 06/06/19 0413 06/07/19 0321  WBC 7.6   < > 6.9 7.2 6.8 6.1 5.9  NEUTROABS 4.2  --  5.3 4.4 4.4 4.2  --   HGB 8.3*   < > 7.7* 8.0* 8.7* 8.6* 8.3*  HCT 25.2*   < > 23.1* 24.0* 26.5* 26.1* 25.9*  MCV 96.6   < > 96.7 97.2 97.1 98.9 98.9  PLT 115*   < > 135* 138* 146* 138* 132*   < > = values in this interval not displayed.   Cardiac Enzymes: No results for input(s): CKTOTAL, CKMB, CKMBINDEX, TROPONINI in the last 168 hours. BNP: Invalid input(s): POCBNP CBG: Recent Labs  Lab 06/06/19 1138 06/06/19 1620 06/06/19 2120 06/07/19 0956 06/07/19 1139  GLUCAP 201* 227* 261* 195* 219*   D-Dimer No results for input(s): DDIMER in the last 72 hours. Hgb A1c No results for input(s): HGBA1C in the last 72 hours. Lipid Profile No results for input(s): CHOL, HDL, LDLCALC, TRIG, CHOLHDL, LDLDIRECT in the last 72 hours. Thyroid function studies Recent Labs    06/06/19 0724  TSH 2.346   Anemia work up No results for input(s): VITAMINB12, FOLATE, FERRITIN, TIBC, IRON, RETICCTPCT in the last 72 hours. Urinalysis    Component Value Date/Time   COLORURINE YELLOW 06/01/2019 0021   APPEARANCEUR CLOUDY (A) 06/01/2019 0021  LABSPEC 1.014 06/01/2019 0021   PHURINE 5.0 06/01/2019 0021   GLUCOSEU NEGATIVE 06/01/2019 0021   HGBUR SMALL (A) 06/01/2019 0021   BILIRUBINUR NEGATIVE 06/01/2019 0021   KETONESUR NEGATIVE 06/01/2019 0021   PROTEINUR 100 (A) 06/01/2019 0021   NITRITE NEGATIVE 06/01/2019 0021   LEUKOCYTESUR NEGATIVE 06/01/2019 0021    Sepsis Labs Invalid input(s): PROCALCITONIN,  WBC,  LACTICIDVEN Microbiology Recent Results (from the past 240 hour(s))  MRSA PCR Screening     Status: None   Collection Time: 05/31/19 11:56 PM   Specimen: Nasal Mucosa; Nasopharyngeal  Result Value Ref Range Status   MRSA by PCR NEGATIVE NEGATIVE Final    Comment:        The GeneXpert MRSA Assay (FDA approved for NASAL specimens only), is one component of a comprehensive MRSA colonization surveillance program. It is not intended to diagnose MRSA infection nor to guide or monitor treatment for MRSA infections. Performed at [facility], [place] [address] [place], [place] [address]   Culture, blood (Routine X 2) w Reflex to ID Panel     Status: None   Collection Time: 06/01/19 12:22 AM   Specimen: BLOOD  Result Value Ref Range Status   Specimen Description BLOOD LEFT ANTECUBITAL  Final   Special Requests   Final    BOTTLES DRAWN AEROBIC AND ANAEROBIC Blood Culture adequate volume   Culture   Final    NO GROWTH 5 DAYS Performed at [facility], [place] [address], [place], [place] [address]    Report Status 06/06/2019 FINAL  Final  Culture, blood (Routine X 2) w Reflex to ID Panel     Status: None   Collection Time: 06/01/19 12:28 AM   Specimen: BLOOD LEFT HAND  Result Value Ref Range Status   Specimen Description BLOOD LEFT HAND  Final   Special Requests   Final    BOTTLES DRAWN AEROBIC ONLY Blood Culture adequate volume   Culture   Final    NO GROWTH 5 DAYS Performed at [facility], [place] [address] [place], [place] [address]    Report Status 06/06/2019 FINAL  Final     Time coordinating discharge: 35 minutes  SIGNED:   Jonnie Finner, DO  [facility] 06/07/2019, 2:57 PM   If 7PM-7AM, please contact night-coverage [email or web address]

## 2019-06-07 NOTE — Addendum Note (Signed)
Addendum  created 06/07/19 0927 by Gwenyth Allegra, CRNA   Flowsheet accepted, Intraprocedure Flowsheets edited

## 2019-06-18 ENCOUNTER — Telehealth: Payer: Self-pay | Admitting: Adult Health

## 2019-06-18 NOTE — Telephone Encounter (Signed)
Contacted patient, he states that his wife is his caregiver and she takes care of all his medications, advised okay for wife to come back with patient to the appointment.  Placed message in notes.  Will route to primary to make aware.  Thanks!

## 2019-06-18 NOTE — Telephone Encounter (Signed)
New Message  Patient is calling in to get approval for his wife to accompany him to his appointment on 06/20/19 with Lorin Picket. Patient states that he has trouble with agility and stability when walking and requires his wife to come to appointment with him. Please give patient a call to assist.

## 2019-06-18 NOTE — Progress Notes (Signed)
Cardiology Office Note   Date:  06/20/2019   ID:  Zachary Parrish, DOB Dec 10, 1940, MRN [id number]  PCP:  Jamesetta Geralds, MD  Cardiologist: Dr. Percival Spanish  No chief complaint on file.    History of Present Illness: Zachary Parrish is a 79 y.o. male who presents for CAD, He underwent cardiac catheterization in 2014 at [facility], this revealed 60 to 70% calcified left main lesion, high-grade ramus artery disease.  Medical therapy was recommended.h/o of dilated CM with improved EF, HTN, HLD, and DM2 seen on consultation during recent hospitalization for new onset atrial fib.He was initially admitted with ARF on CRF He had septic shock.    He received DCCV during hospitalization on 06/07/2019.He was started on Eliquis, continued on coreg and amlodipine.  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   with acute kidney injury and transaminitis. He was place on levophed, due to shock. He was started on prednisone with taper over 8-12 weeks. He is back on lasix and takes 40 mg BID, for two weeks, with reduction to 40 mg daily on 06/27/2019. Labs are being drawn via his PCP through [place]. He is being followed closely by nephrology and endocrinology.   He is here for post hospital follow up.  He is becoming more active, and has a good appetite. He denies bleeding, excessive bruising. He is deconditioned but is working with PT. His main complaint is insomnia.     Past Medical History:  Diagnosis Date   CAD (coronary artery disease) 2014   60-70% calcified LM lesion, high grade OM lesion, treated medically.   DM (diabetes mellitus) ([place])    no longer on medications   Gout    HTN (hypertension)    PAF (paroxysmal atrial fibrillation) ([place]) 06/03/2019   during admission for DRESS    Past Surgical History:  Procedure Laterality Date   CARDIAC CATHETERIZATION  2014   CARDIOVERSION N/A 06/07/2019   Procedure: CARDIOVERSION;  Surgeon:  Donato Heinz, MD;  Location: [facility] ENDOSCOPY;  Service: Cardiovascular;  Laterality: N/A;   TEE WITHOUT CARDIOVERSION N/A 06/07/2019   Procedure: TRANSESOPHAGEAL ECHOCARDIOGRAM (TEE);  Surgeon: Donato Heinz, MD;  Location: [facility] ENDOSCOPY;  Service: Cardiovascular;  Laterality: N/A;     Current Outpatient Medications  Medication Sig Dispense Refill   amLODipine (NORVASC) 5 MG tablet Take 1 tablet (5 mg total) by mouth daily. 30 tablet 0   apixaban (ELIQUIS) 5 MG TABS tablet Take 1 tablet (5 mg total) by mouth 2 (two) times daily. 60 tablet 3   aspirin 81 MG chewable tablet Chew 81 mg by mouth daily.      atorvastatin (LIPITOR) 40 MG tablet Take 40 mg by mouth daily.     blood glucose meter kit and supplies KIT Dispense based on patient and insurance preference. Use up to four times daily as directed. (FOR ICD-9 250.00, 250.01). 1 each 0   carvedilol (COREG) 25 MG tablet Take 1 tablet (25 mg total) by mouth 2 (two) times daily. Hold dose if heart rate is less than 50     Coenzyme Q10 300 MG CAPS Take 1 capsule by mouth daily.     cyanocobalamin 1000 MCG tablet Take 1 tablet by mouth daily.     furosemide (LASIX) 40 MG tablet Take 40 mg by mouth 2 (two) times daily.     Insulin Glargine (BASAGLAR KWIKPEN) 100 UNIT/ML Inject 0.08 mLs (8 Units total) into the skin daily. 1 pen 0  KLOR-CON M20 20 MEQ tablet Take 10 mEq by mouth daily.      losartan (COZAAR) 25 MG tablet Take 25 mg by mouth daily.     Omega-3 Fatty Acids (FISH OIL) 1000 MG CAPS Take 2 capsules by mouth daily.     predniSONE (DELTASONE) 20 MG tablet Take 2 tablets (40 mg total) by mouth daily with breakfast for 14 days, THEN 1 tablet (20 mg total) daily with breakfast for 14 days. 42 tablet 0   No current facility-administered medications for this visit.    Allergies:   Allopurinol and Colchicine    Social History:  The patient  reports that he has quit smoking. He uses smokeless tobacco. He reports current  alcohol use. He reports that he does not use drugs.   Family History:  The patient's family history includes Heart attack in his father; Osteoporosis in his mother.    ROS: All other systems are reviewed and negative. Unless otherwise mentioned in H&P    PHYSICAL EXAM: VS:  BP 125/70   Pulse 61   Temp (!) 94.6 F (34.8 C)   Ht 6' 1"  (1.854 m)   Wt 209 lb 6.4 oz (95 kg)   SpO2 99%   BMI 27.63 kg/m  , BMI Body mass index is 27.63 kg/m. GEN: Well nourished, well developed, in no acute distress HEENT: normal Neck: no JVD, carotid bruits, or masses Cardiac: RRR; no murmurs, rubs, or gallops,no edema  Respiratory:  Clear to auscultation bilaterally, normal work of breathing GI: soft, nontender, nondistended, + BS MS: no deformity or atrophy Skin: warm and dry, no rash Neuro:  Strength and sensation are intact Psych: euthymic mood, full affect   EKG:  Personally reviewed. SR with PAC's, rate of 61 bpm.   Recent Labs: 06/06/2019: ALT 161; TSH 2.346 06/07/2019: BUN 56; Creatinine, Ser 2.37; Hemoglobin 8.3; Magnesium 2.2; Platelets 132; Potassium 4.3; Sodium 142    Lipid Panel No results found for: CHOL, TRIG, HDL, CHOLHDL, VLDL, LDLCALC, LDLDIRECT    Wt Readings from Last 3 Encounters:  06/20/19 209 lb 6.4 oz (95 kg)  06/07/19 229 lb 12.8 oz (104.2 kg)      Other studies Reviewed: Echo 06/01/19  1. Left ventricular ejection fraction, by estimation, is 55 to 60%. The  left ventricle has normal function. The left ventricle has no regional  wall motion abnormalities. There is moderate asymmetric left ventricular  hypertrophy of the basal-septal  segment. LV diastolic function is grossly normal. Suboptimal assessment of  medial tissue Doppler for assessment of LV filling pressure.   2. Right ventricular systolic function is normal. The right ventricular  size is normal. Tricuspid regurgitation signal is inadequate for assessing  PA pressure.   3. The mitral valve is normal  in structure. Trivial mitral valve  regurgitation. No evidence of mitral stenosis.   4. The aortic valve is mildly calcified. Aortic valve regurgitation is  not visualized. No aortic stenosis is present.      ASSESSMENT AND PLAN:  1. Paroxysmal atrial fibrillation: Has had DCCV while hospitalized and in now in NSR with PAC's. He remains on Eliquis 5 mg BID. Last creatinine 2.37.  Will need to stay on Eliquis for a minimum of 4 weeks post cardioversion. CHADS Vasc Score of 2.  Will see him in one month.   2. CAD: He underwent cardiac catheterization in 2014 at [facility], this revealed 60 to 70% calcified left main lesion, high-grade ramus artery disease.  Medical therapy was recommended.Followed  at that time by Dr. Lamar Blinks.  3. Hyperlipidemia: Remains on atorvastatin. Will repeat labs in 3 months.   4. Hypertension: Well controlled. Remains on ARB and amlodipine. Labs tomorrow. Will defer to nephrology concerning continuation of ARBas and lasix.  .    3. CKD: Followed by nephrology  4. Type II diabetes: Followed by endocrinology   Current medicines are reviewed at length with the patient today.  I have spent 45 minutes dedicated to the care of this patient on the date of this encounter to include pre-visit review of records, assessment, management and diagnostic testing,with shared decision making.  Labs/ tests ordered today include: None. Awaiting labs from [facility]. [place] Pugh, ANP, [facility]   06/20/2019 9:28 AM    [facility] [place] [address] Office [phone] Fax [phone]  Notice: This dictation was prepared with Dragon dictation along with smaller phrase technology. Any transcriptional errors that result from this process are unintentional and may not be corrected upon review.

## 2019-06-20 ENCOUNTER — Other Ambulatory Visit: Payer: Self-pay

## 2019-06-20 ENCOUNTER — Ambulatory Visit (INDEPENDENT_AMBULATORY_CARE_PROVIDER_SITE_OTHER): Payer: Medicare Other | Admitting: Adult Health

## 2019-06-20 ENCOUNTER — Encounter: Payer: Self-pay | Admitting: Adult Health

## 2019-06-20 VITALS — BP 125/70 | HR 61 | Temp 94.6°F | Ht 73.0 in | Wt 209.4 lb

## 2019-06-20 DIAGNOSIS — R748 Abnormal levels of other serum enzymes: Secondary | ICD-10-CM

## 2019-06-20 DIAGNOSIS — R579 Shock, unspecified: Secondary | ICD-10-CM

## 2019-06-20 DIAGNOSIS — N179 Acute kidney failure, unspecified: Secondary | ICD-10-CM

## 2019-06-20 DIAGNOSIS — I10 Essential (primary) hypertension: Secondary | ICD-10-CM | POA: Diagnosis not present

## 2019-06-20 DIAGNOSIS — E7800 Pure hypercholesterolemia, unspecified: Secondary | ICD-10-CM

## 2019-06-20 DIAGNOSIS — I4891 Unspecified atrial fibrillation: Secondary | ICD-10-CM

## 2019-06-20 MED ORDER — APIXABAN 5 MG PO TABS: 5.0000 mg | ORAL_TABLET | Freq: Two times a day (BID) | ORAL | 3 refills | Status: DC

## 2019-06-20 NOTE — Patient Instructions (Addendum)
Medication Instructions:  Continue current medications  *If you need a refill on your cardiac medications before your next appointment, please call your pharmacy*   Lab Work: None Ordered   Testing/Procedures: None Ordered   Follow-Up: At [facility], you and your health needs are our priority.  As part of our continuing mission to provide you with exceptional heart care, we have created designated Provider Care Teams.  These Care Teams include your primary Cardiologist (physician) and Advanced Practice Providers (APPs -  Physician Assistants and Nurse Practitioners) who all work together to provide you with the care you need, when you need it.  We recommend signing up for the patient portal called "MyChart".  Sign up information is provided on this After Visit Summary.  MyChart is used to connect with patients for Virtual Visits (Telemedicine).  Patients are able to view lab/test results, encounter notes, upcoming appointments, etc.  Non-urgent messages can be sent to your provider as well.   To learn more about what you can do with MyChart, go to [email or web address].    Your next appointment:   Friday May 7th @ 9:00 am  The format for your next appointment:   In Person  Provider:   Rollene Rotunda, MD

## 2019-07-19 DIAGNOSIS — I10 Essential (primary) hypertension: Secondary | ICD-10-CM

## 2019-07-19 DIAGNOSIS — I351 Nonrheumatic aortic (valve) insufficiency: Secondary | ICD-10-CM

## 2019-07-19 DIAGNOSIS — Z7189 Other specified counseling: Secondary | ICD-10-CM | POA: Insufficient documentation

## 2019-07-19 DIAGNOSIS — N1831 Chronic kidney disease, stage 3a: Secondary | ICD-10-CM | POA: Insufficient documentation

## 2019-07-19 DIAGNOSIS — I2510 Atherosclerotic heart disease of native coronary artery without angina pectoris: Secondary | ICD-10-CM

## 2019-07-19 DIAGNOSIS — N184 Chronic kidney disease, stage 4 (severe): Secondary | ICD-10-CM | POA: Insufficient documentation

## 2019-07-19 DIAGNOSIS — E118 Type 2 diabetes mellitus with unspecified complications: Secondary | ICD-10-CM | POA: Insufficient documentation

## 2019-07-19 HISTORY — DX: Nonrheumatic aortic (valve) insufficiency: I35.1

## 2019-07-19 HISTORY — DX: Atherosclerotic heart disease of native coronary artery without angina pectoris: I25.10

## 2019-07-19 HISTORY — DX: Chronic kidney disease, stage 4 (severe): N18.4

## 2019-07-19 HISTORY — DX: Essential (primary) hypertension: I10

## 2019-07-19 NOTE — Progress Notes (Signed)
Cardiology Office Note   Date:  07/20/2019   ID:  Zachary Parrish, DOB [birth year], MRN [id number]  PCP:  Jamesetta Geralds, MD  Cardiologist:   Minus Breeding, MD  Chief Complaint  Patient presents with  . Atrial Fibrillation      History of Present Illness: Zachary Parrish is a 79 y.o. male who presents for follow up of CAD, He underwent cardiac catheterization in 2014 at [facility], this revealed 60 to 70% calcified left main lesion, high-grade ramus artery disease.  Medical therapy was recommended.  He had a history of dilated CM with improved EF eventually.  He was in the hospital in March with DRESS (drug reaction with eosinophilia and systemic symptom) secondary to allopurinol.   He had acute kidney injury and transaminitis. He was place on levophed, due to shock.  He had atrial fib and had DCCV.    He did have to go back to an urgent care as he stopped his steroids somewhat abruptly per instruction.  He felt poorly.  I did review these records.  In particular I wanted to see if he was in atrial fibrillation.  He was in junctional rhythm by the report.  He was started back on a lower dose of steroids with a slower taper.  He says he is otherwise done well.  His renal function which was markedly abnormal during his hospitalization he says has returned.  Baseline with a creatinine about 1.9 although I do not have these records.  He is followed by nephrology.  He has not felt any palpitations, presyncope or syncope.  He was tired and so he restarted a lower dose of steroids.  He has not had any chest pressure, neck or arm discomfort.  He has some mild lower extremity edema.   Past Medical History:  Diagnosis Date  . CAD (coronary artery disease) 2014   60-70% calcified LM lesion, high grade OM lesion, treated medically.  . DM (diabetes mellitus) ([place])    no longer on medications  . Gout   . HTN (hypertension)   . PAF (paroxysmal atrial fibrillation) ([place]) 06/03/2019   during admission  for DRESS    Past Surgical History:  Procedure Laterality Date  . CARDIAC CATHETERIZATION  2014  . CARDIOVERSION N/A 06/07/2019   Procedure: CARDIOVERSION;  Surgeon: Donato Heinz, MD;  Location: [facility] ENDOSCOPY;  Service: Cardiovascular;  Laterality: N/A;  . TEE WITHOUT CARDIOVERSION N/A 06/07/2019   Procedure: TRANSESOPHAGEAL ECHOCARDIOGRAM (TEE);  Surgeon: Donato Heinz, MD;  Location: [facility] ENDOSCOPY;  Service: Cardiovascular;  Laterality: N/A;     Current Outpatient Medications  Medication Sig Dispense Refill  . aspirin 81 MG chewable tablet Chew 81 mg by mouth daily.     Marland Kitchen atorvastatin (LIPITOR) 40 MG tablet Take 40 mg by mouth daily.    . blood glucose meter kit and supplies KIT Dispense based on patient and insurance preference. Use up to four times daily as directed. (FOR ICD-9 250.00, 250.01). 1 each 0  . carvedilol (COREG) 25 MG tablet Take 1 tablet (25 mg total) by mouth 2 (two) times daily. Hold dose if heart rate is less than 50    . Coenzyme Q10 300 MG CAPS Take 1 capsule by mouth daily.    . furosemide (LASIX) 40 MG tablet Take 40 mg by mouth daily.     . Insulin Glargine (BASAGLAR KWIKPEN) 100 UNIT/ML Inject 0.08 mLs (8 Units total) into the skin daily. 1 pen 0  . KLOR-CON M20 20  MEQ tablet Take 10 mEq by mouth daily.     Marland Kitchen losartan (COZAAR) 25 MG tablet Take 25 mg by mouth daily.    . Omega-3 Fatty Acids (FISH OIL) 1000 MG CAPS Take 2 capsules by mouth daily.    . predniSONE (DELTASONE) 10 MG tablet 3 tabs daily x 4 days, 2 tabs daily x 4 days, 1 tablet daily x 4 days     No current facility-administered medications for this visit.    Allergies:   Allopurinol and Colchicine    ROS:  Please see the history of present illness.   Otherwise, review of systems are positive for none.   All other systems are reviewed and negative.    PHYSICAL EXAM: VS:  BP 122/72   Pulse 65   Ht _0  (1.854 m)   Wt 205 lb 14.4 oz (93.4 kg)   SpO2 99%   BMI 27.17 kg/m   , BMI Body mass index is 27.17 kg/m. GENERAL:  Well appearing NECK:  No jugular venous distention, waveform within normal limits, carotid upstroke brisk and symmetric, no bruits, no thyromegaly LUNGS:  Clear to auscultation bilaterally CHEST:  Unremarkable HEART:  PMI not displaced or sustained,S1 and S2 within normal limits, no S3, no S4, no clicks, no rubs, no murmurs ABD:  Flat, positive bowel sounds normal in frequency in pitch, no bruits, no rebound, no guarding, no midline pulsatile mass, no hepatomegaly, no splenomegaly EXT:  2 plus pulses throughout, mild ankle edema, no cyanosis no clubbing   EKG:  EKG is ordered today. The ekg ordered today demonstrates sinus rhythm, rate 60, axis within normal limits, intervals within normal limits, no acute ST-T wave changes.   Recent Labs: 06/06/2019: ALT 161; TSH 2.346 06/07/2019: BUN 56; Creatinine, Ser 2.37; Hemoglobin 8.3; Magnesium 2.2; Platelets 132; Potassium 4.3; Sodium 142    Lipid Panel No results found for: CHOL, TRIG, HDL, CHOLHDL, VLDL, LDLCALC, LDLDIRECT    Wt Readings from Last 3 Encounters:  07/20/19 205 lb 14.4 oz (93.4 kg)  06/20/19 209 lb 6.4 oz (95 kg)  06/07/19 229 lb 12.8 oz (104.2 kg)      Other studies Reviewed: Additional studies/ records that were reviewed today include: Urgent care records. Review of the above records demonstrates:  Please see elsewhere in the note.     ASSESSMENT AND PLAN:  Paroxysmal atrial fibrillation:     He has had no symptomatic recurrence of this.  He has had a couple of EKGs including today that have not demonstrated atrial fibrillation.   His atrial fibrillation was related to extraordinary events during his hospitalization.  I think it is safe to discontinue his Eliquis.  He will continue aspirin alone.  He is going to get an Alive Cor.  He is already monitoring himself twice daily with a pulse ox and has noticed no rapid heart rates.  He will continue to do this.  If we see  fibrillation in the future we can restart his DOAC.   CAD:  He has nonobstructive disease as described above.  He has no cardiovascular complications other than his arrhythmia noted during his recent severe hospitalization.  Not think he is having flow-limiting symptoms.  He will continue with risk reduction.   Hyperlipidemia:   I will check a lipid profile liver enzymes.  Hypertension:  Blood pressure is well controlled.  No change in therapy.   CKD:  This is followed by nephrology.  Type II diabetes: A1c was most recently 7.2. Followed  by endocrinology  Covid education: He has had his Covid vaccine.  Current medicines are reviewed at length with the patient today.  The patient does not have concerns regarding medicines.  The following changes have been made:  As above  Labs/ tests ordered today include:   Orders Placed This Encounter  Procedures  . Lipid panel  . Hepatic function panel  . EKG 12-Lead     Disposition:   FU with me or APP in 4 months.     Signed, Minus Breeding, MD  07/20/2019 10:01 AM    [facility]

## 2019-07-20 ENCOUNTER — Other Ambulatory Visit: Payer: Self-pay

## 2019-07-20 ENCOUNTER — Encounter: Payer: Self-pay | Admitting: Cardiology

## 2019-07-20 ENCOUNTER — Ambulatory Visit (INDEPENDENT_AMBULATORY_CARE_PROVIDER_SITE_OTHER): Payer: Medicare Other | Admitting: Cardiology

## 2019-07-20 VITALS — BP 122/72 | HR 65 | Ht 73.0 in | Wt 205.9 lb

## 2019-07-20 DIAGNOSIS — I10 Essential (primary) hypertension: Secondary | ICD-10-CM | POA: Diagnosis not present

## 2019-07-20 DIAGNOSIS — I351 Nonrheumatic aortic (valve) insufficiency: Secondary | ICD-10-CM | POA: Diagnosis not present

## 2019-07-20 DIAGNOSIS — N1831 Chronic kidney disease, stage 3a: Secondary | ICD-10-CM

## 2019-07-20 DIAGNOSIS — I2510 Atherosclerotic heart disease of native coronary artery without angina pectoris: Secondary | ICD-10-CM | POA: Diagnosis not present

## 2019-07-20 DIAGNOSIS — E118 Type 2 diabetes mellitus with unspecified complications: Secondary | ICD-10-CM

## 2019-07-20 DIAGNOSIS — Z7189 Other specified counseling: Secondary | ICD-10-CM

## 2019-07-20 DIAGNOSIS — E7800 Pure hypercholesterolemia, unspecified: Secondary | ICD-10-CM

## 2019-07-20 DIAGNOSIS — I480 Paroxysmal atrial fibrillation: Secondary | ICD-10-CM | POA: Diagnosis not present

## 2019-07-20 NOTE — Patient Instructions (Signed)
Medication Instructions:  STOP ELIQUIS *If you need a refill on your cardiac medications before your next appointment, please call your pharmacy*  Lab Work: Your physician recommends that you return for lab work today (LIPID/LIVER)  Testing/Procedures: NONE ORDERED THIS VISIT  Follow-Up: At [facility], you and your health needs are our priority.  As part of our continuing mission to provide you with exceptional heart care, we have created designated Provider Care Teams.  These Care Teams include your primary Cardiologist (physician) and Advanced Practice Providers (APPs -  Physician Assistants and Nurse Practitioners) who all work together to provide you with the care you need, when you need it.  Your next appointment:   4 month(s)  The format for your next appointment:   In Person  Provider:   You may see Rollene Rotunda, MD or one of the following Advanced Practice Providers on your designated Care Team:    Joni Reining, DNP, ANP  Azalee Course, PA-C  [place], [place]  Edd Fabian, NP  Other Instructions ALIVE COR BY Lourena Simmonds

## 2019-07-21 LAB — LIPID PANEL
Chol/HDL Ratio: 4.9 ratio (ref 0.0–5.0)
Cholesterol, Total: 157 mg/dL (ref 100–199)
HDL: 32 mg/dL — ABNORMAL LOW (ref >39)
LDL Chol Calc (NIH): 101 mg/dL — ABNORMAL HIGH (ref 0–99)
Triglycerides: 130 mg/dL (ref 0–149)
VLDL Cholesterol Cal: 24 mg/dL (ref 5–40)

## 2019-07-21 LAB — HEPATIC FUNCTION PANEL
ALT: 121 IU/L — ABNORMAL HIGH (ref 0–44)
AST: 52 IU/L — ABNORMAL HIGH (ref 0–40)
Albumin: 3.4 g/dL — ABNORMAL LOW (ref 3.7–4.7)
Alkaline Phosphatase: 87 IU/L (ref 39–117)
Bilirubin Total: 0.8 mg/dL (ref 0.0–1.2)
Bilirubin, Direct: 0.43 mg/dL — ABNORMAL HIGH (ref 0.00–0.40)
Total Protein: 5.7 g/dL — ABNORMAL LOW (ref 6.0–8.5)

## 2019-07-23 ENCOUNTER — Ambulatory Visit: Payer: Medicare Other | Admitting: Adult Health

## 2019-08-02 DIAGNOSIS — E538 Deficiency of other specified B group vitamins: Secondary | ICD-10-CM

## 2019-08-02 HISTORY — DX: Deficiency of other specified B group vitamins: E53.8

## 2019-09-06 DIAGNOSIS — M109 Gout, unspecified: Secondary | ICD-10-CM

## 2019-09-06 HISTORY — DX: Gout, unspecified: M10.9

## 2019-11-22 DIAGNOSIS — E785 Hyperlipidemia, unspecified: Secondary | ICD-10-CM

## 2019-11-22 HISTORY — DX: Hyperlipidemia, unspecified: E78.5

## 2019-11-22 NOTE — Progress Notes (Signed)
Cardiology Office Note   Date:  11/23/2019   ID:  Zachary Parrish, DOB [birth year], MRN [id number]  PCP:  Verl Bangs, MD  Cardiologist:   Rollene Rotunda, MD  Chief Complaint  Patient presents with  . Coronary Artery Disease      History of Present Illness: Zachary Parrish is a 79 y.o. male who presents for follow up of CAD, He underwent cardiac catheterization in 2014 at [facility], this revealed 60 to 70% calcified left main lesion, high-grade ramus artery disease.  Medical therapy was recommended.  He had a history of dilated CM with improved EF eventually.  He was in the hospital in March with DRESS (drug reaction with eosinophilia and systemic symptom) secondary to allopurinol.   He had acute kidney injury and transaminitis. He was place on levophed, due to shock.  He had atrial fib and had DCCV.    Since I last saw him he says he feels very well. The patient denies any new symptoms such as chest discomfort, neck or arm discomfort. There has been no new shortness of breath, PND or orthopnea. There have been no reported palpitations, presyncope or syncope.    Past Medical History:  Diagnosis Date  . CAD (coronary artery disease) 2014   60-70% calcified LM lesion, high grade OM lesion, treated medically.  . DM (diabetes mellitus) ([facility])    no longer on medications  . Gout   . HTN (hypertension)   . PAF (paroxysmal atrial fibrillation) ([facility]) 06/03/2019   during admission for DRESS    Past Surgical History:  Procedure Laterality Date  . CARDIAC CATHETERIZATION  2014  . CARDIOVERSION N/A 06/07/2019   Procedure: CARDIOVERSION;  Surgeon: Little Ishikawa, MD;  Location: [facility] ENDOSCOPY;  Service: Cardiovascular;  Laterality: N/A;  . TEE WITHOUT CARDIOVERSION N/A 06/07/2019   Procedure: TRANSESOPHAGEAL ECHOCARDIOGRAM (TEE);  Surgeon: Little Ishikawa, MD;  Location: [facility] ENDOSCOPY;  Service: Cardiovascular;  Laterality: N/A;     Current Outpatient Medications    Medication Sig Dispense Refill  . aspirin 81 MG chewable tablet Chew 81 mg by mouth daily.     Marland Kitchen atorvastatin (LIPITOR) 40 MG tablet Take 40 mg by mouth daily.    . carvedilol (COREG) 25 MG tablet Take 1 tablet (25 mg total) by mouth 2 (two) times daily. Hold dose if heart rate is less than 50    . Coenzyme Q10 300 MG CAPS Take 1 capsule by mouth daily.    . furosemide (LASIX) 40 MG tablet Take 40 mg by mouth daily.     Marland Kitchen KLOR-CON M20 20 MEQ tablet Take 10 mEq by mouth daily.     . Omega-3 Fatty Acids (FISH OIL) 1000 MG CAPS Take 2 capsules by mouth daily.     No current facility-administered medications for this visit.    Allergies:   Allopurinol and Colchicine    ROS:  Please see the history of present illness.   Otherwise, review of systems are positive for none.   All other systems are reviewed and negative.    PHYSICAL EXAM: VS:  BP 128/68   Pulse 84   Ht 6\' 1"  (1.854 m)   Wt 189 lb (85.7 kg)   SpO2 99%   BMI 24.94 kg/m  , BMI Body mass index is 24.94 kg/m. GENERAL:  Well appearing NECK:  No jugular venous distention, waveform within normal limits, carotid upstroke brisk and symmetric, right carotid bruits, no thyromegaly LUNGS:  Clear to auscultation bilaterally CHEST:  Unremarkable  HEART:  PMI not displaced or sustained,S1 and S2 within normal limits, no S3, no S4, no clicks, no rubs, no murmurs ABD:  Flat, positive bowel sounds normal in frequency in pitch, no bruits, no rebound, no guarding, no midline pulsatile mass, no hepatomegaly, no splenomegaly EXT:  2 plus pulses throughout, no edema, no cyanosis no clubbing     EKG:  EKG is not ordered today.   Recent Labs: 06/06/2019: TSH 2.346 06/07/2019: BUN 56; Creatinine, Ser 2.37; Hemoglobin 8.3; Magnesium 2.2; Platelets 132; Potassium 4.3; Sodium 142 07/20/2019: ALT 121    Lipid Panel    Component Value Date/Time   CHOL 157 07/20/2019 1003   TRIG 130 07/20/2019 1003   HDL 32 (L) 07/20/2019 1003   CHOLHDL 4.9  07/20/2019 1003   LDLCALC 101 (H) 07/20/2019 1003      Wt Readings from Last 3 Encounters:  11/23/19 189 lb (85.7 kg)  07/20/19 205 lb 14.4 oz (93.4 kg)  06/20/19 209 lb 6.4 oz (95 kg)      Other studies Reviewed: Additional studies/ records that were reviewed today include: None Review of the above records demonstrates:  NA  ASSESSMENT AND PLAN:  Paroxysmal atrial fibrillation:    He has had no symptomatic paroxysms.  His atrial fibrillation was related to his acute illness.  He is not on anticoagulation because we have not seen recurrence of this.  No change in therapy.  CAD:   The patient has no new sypmtoms.  No further cardiovascular testing is indicated.  We will continue with aggressive risk reduction and meds as listed.  Hyperlipidemia:   LDL was not quite at target.  If on upcoming labs is not below 70 I will increase her Lipitor changed to Crestor.   Hypertension:    The blood pressure is at target.  No change in therapy.    CKD:  This is followed by nephrology with a creatinine of 1.81.   Type II diabetes: A1c was most recently 6.1 he reports.  He is followed by endocrinology.   Bruit:   I will get carotid Dopplers.  Covid education: He has had his Covid vaccine.  Current medicines are reviewed at length with the patient today.  The patient does not have concerns regarding medicines.  The following changes have been made:  As above  Labs/ tests ordered today include:   Orders Placed This Encounter  Procedures  . VAS US CAROTID     Disposition:   FU with me in 12 months.    Signed, Rollene Rotunda, MD  11/23/2019 8:46 AM    [facility]

## 2019-11-23 ENCOUNTER — Encounter: Payer: Self-pay | Admitting: Cardiology

## 2019-11-23 ENCOUNTER — Other Ambulatory Visit: Payer: Self-pay

## 2019-11-23 ENCOUNTER — Ambulatory Visit (INDEPENDENT_AMBULATORY_CARE_PROVIDER_SITE_OTHER): Payer: Medicare Other | Admitting: Cardiology

## 2019-11-23 VITALS — BP 128/68 | HR 84 | Ht 73.0 in | Wt 189.0 lb

## 2019-11-23 DIAGNOSIS — I480 Paroxysmal atrial fibrillation: Secondary | ICD-10-CM

## 2019-11-23 DIAGNOSIS — I10 Essential (primary) hypertension: Secondary | ICD-10-CM

## 2019-11-23 DIAGNOSIS — R0989 Other specified symptoms and signs involving the circulatory and respiratory systems: Secondary | ICD-10-CM

## 2019-11-23 DIAGNOSIS — I2510 Atherosclerotic heart disease of native coronary artery without angina pectoris: Secondary | ICD-10-CM

## 2019-11-23 DIAGNOSIS — E785 Hyperlipidemia, unspecified: Secondary | ICD-10-CM

## 2019-11-23 DIAGNOSIS — E118 Type 2 diabetes mellitus with unspecified complications: Secondary | ICD-10-CM

## 2019-11-23 DIAGNOSIS — N1831 Chronic kidney disease, stage 3a: Secondary | ICD-10-CM

## 2019-11-23 NOTE — Patient Instructions (Addendum)
Medication Instructions:  Your physician recommends that you continue on your current medications as directed. Please refer to the Current Medication list given to you today.  *If you need a refill on your cardiac medications before your next appointment, please call your pharmacy*  Lab Work: NONE   Testing/Procedures: Your physician has requested that you have a carotid duplex. This test is an ultrasound of the carotid arteries in your neck. It looks at blood flow through these arteries that supply the brain with blood. Allow one hour for this exam. There are no restrictions or special instructions.  Follow-Up: At [facility], you and your health needs are our priority.  As part of our continuing mission to provide you with exceptional heart care, we have created designated Provider Care Teams.  These Care Teams include your primary Cardiologist (physician) and Advanced Practice Providers (APPs -  Physician Assistants and Nurse Practitioners) who all work together to provide you with the care you need, when you need it.  We recommend signing up for the patient portal called "MyChart".  Sign up information is provided on this After Visit Summary.  MyChart is used to connect with patients for Virtual Visits (Telemedicine).  Patients are able to view lab/test results, encounter notes, upcoming appointments, etc.  Non-urgent messages can be sent to your provider as well.   To learn more about what you can do with MyChart, go to [email or web address].    Your next appointment:   12 month(s)  You will receive a reminder letter in the mail two months in advance. If you don't receive a letter, please call our office to schedule the follow-up appointment.  The format for your next appointment:   In Person  Provider:   You may see Rollene Rotunda, MD or one of the following Advanced Practice Providers on your designated Care Team:    Theodore Demark, PA-C  Joni Reining, DNP, ANP  Cadence  Fransico Michael, [place]

## 2019-12-27 ENCOUNTER — Telehealth: Payer: Self-pay | Admitting: *Deleted

## 2019-12-27 NOTE — Telephone Encounter (Signed)
-----   Message from Rollene Rotunda, MD sent at 12/27/2019 10:29 AM EDT ----- Lipids looked good.  LDL was low and triglycerides are much better.  No change in therapy.  Call [name] with the results  ----- Message ----- From: Burnell Blanks, LPN Sent: 84/13/2440   8:39 AM EDT To: Rollene Rotunda, MD  GM Dr Antoine Poche Patients labs from 9/29 ar in [facility]  Thanks [name]

## 2019-12-28 NOTE — Telephone Encounter (Signed)
Advised patient, verbalized understanding  

## 2020-01-23 ENCOUNTER — Other Ambulatory Visit (HOSPITAL_COMMUNITY): Payer: Self-pay | Admitting: Cardiology

## 2020-01-23 ENCOUNTER — Ambulatory Visit (HOSPITAL_COMMUNITY)
Admission: RE | Admit: 2020-01-23 | Discharge: 2020-01-23 | Disposition: A | Discharge status: Home / Self Care | Payer: Medicare Other | Source: Ambulatory Visit | Attending: Internal Medicine | Admitting: Internal Medicine

## 2020-01-23 ENCOUNTER — Other Ambulatory Visit: Payer: Self-pay

## 2020-01-23 DIAGNOSIS — I6521 Occlusion and stenosis of right carotid artery: Secondary | ICD-10-CM

## 2020-01-23 DIAGNOSIS — R0989 Other specified symptoms and signs involving the circulatory and respiratory systems: Secondary | ICD-10-CM | POA: Insufficient documentation

## 2020-09-18 IMAGING — DX DG CHEST 1V PORT
1 series · 1 of 1 positions shown · non-contrast
Comparison: None.

CLINICAL DATA: Dyspnea, shortness of breath, cough

EXAM:
PORTABLE CHEST 1 VIEW

[chest ap]
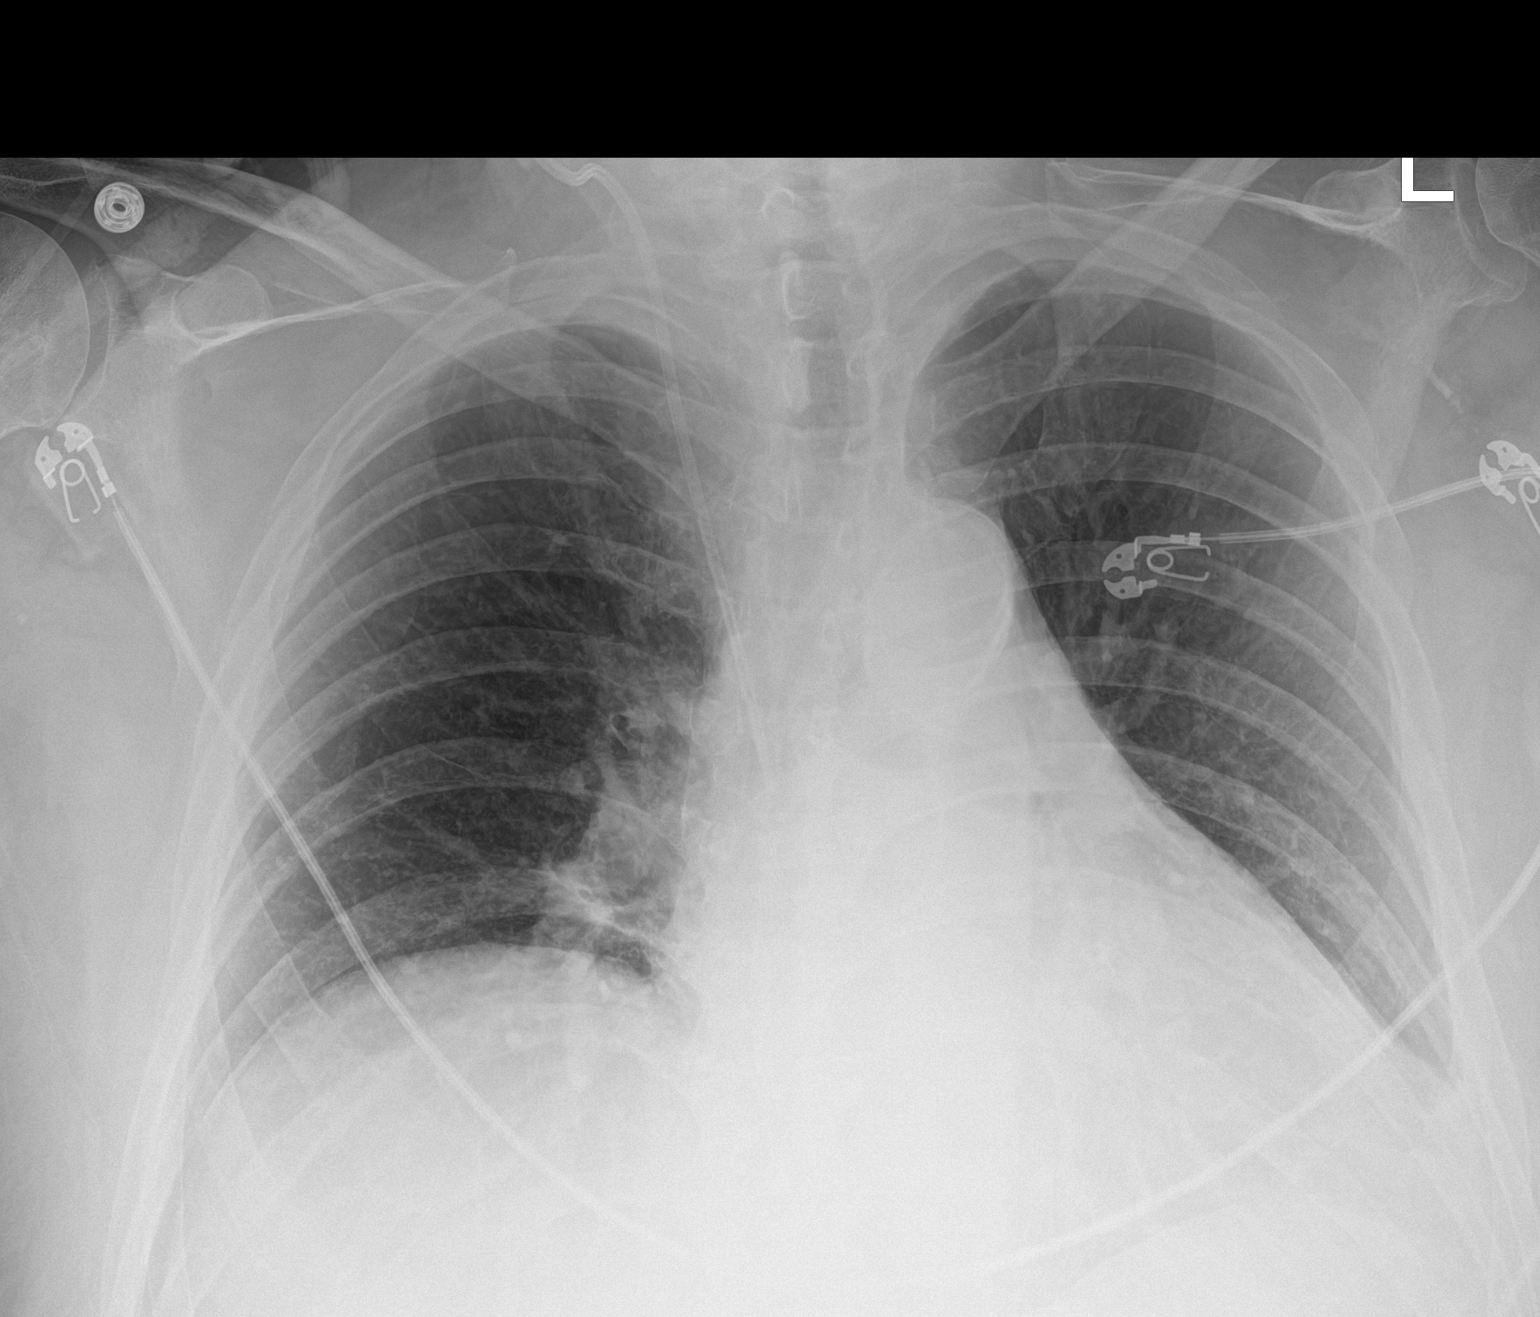

[1 of 1 positions shown; findings below may reference images not displayed]

FINDINGS: Right IJ approach central venous catheter tip terminates near the
superior cavoatrial junction. There is mild cardiomegaly though this
may be accentuated by portable technique. Calcified tortuous aorta.
Low lung volumes with streaky opacities in the bases and
retrocardiac space favoring atelectatic change. No pneumothorax. No
effusion. Vascular calcium noted in the axilla. Telemetry leads
overlie the chest. No acute osseous or soft tissue abnormality.
IMPRESSION: Low volumes and basilar atelectasis.

Central venous catheter tip at the superior cavoatrial junction.

Mild cardiomegaly.

Aortic Atherosclerosis ([id number]).

## 2020-09-21 IMAGING — US US RENAL
1 series · 14 of 25 positions shown · non-contrast
Comparison: None.

CLINICAL DATA: Elevated BUN and creatinine.

EXAM:
RENAL / URINARY TRACT ULTRASOUND COMPLETE

[Series 1: us renal · 14 of 42 slices shown]
[im 1/42]
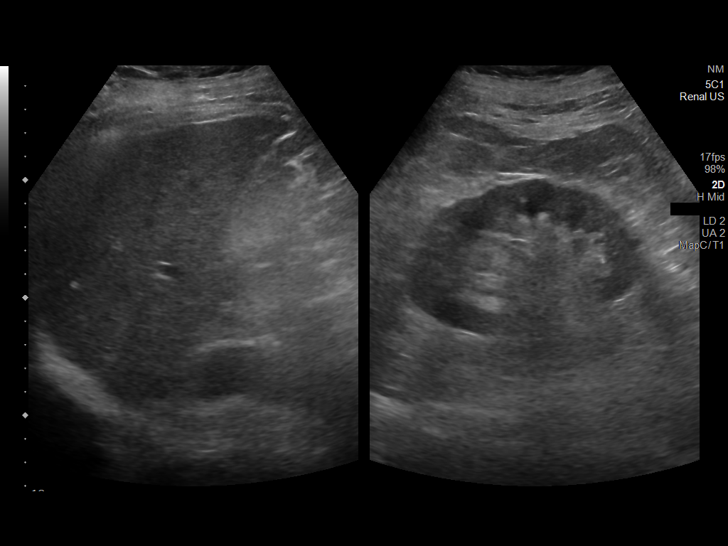
[im 4/42]
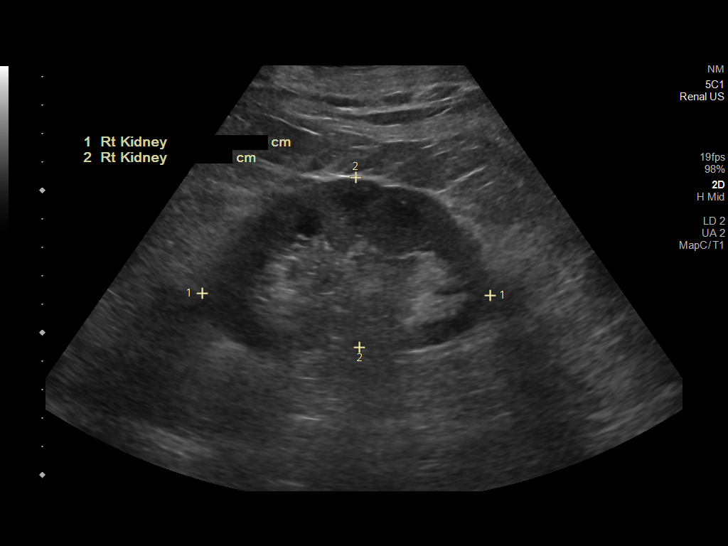
[im 7/42]
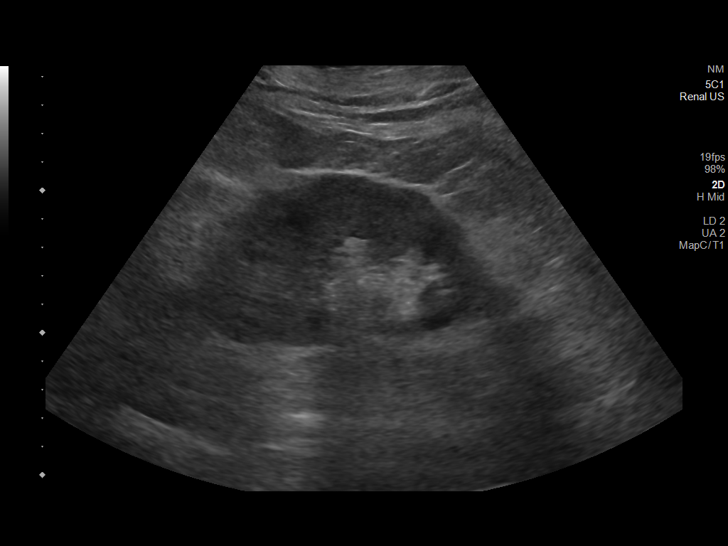
[im 11/42]
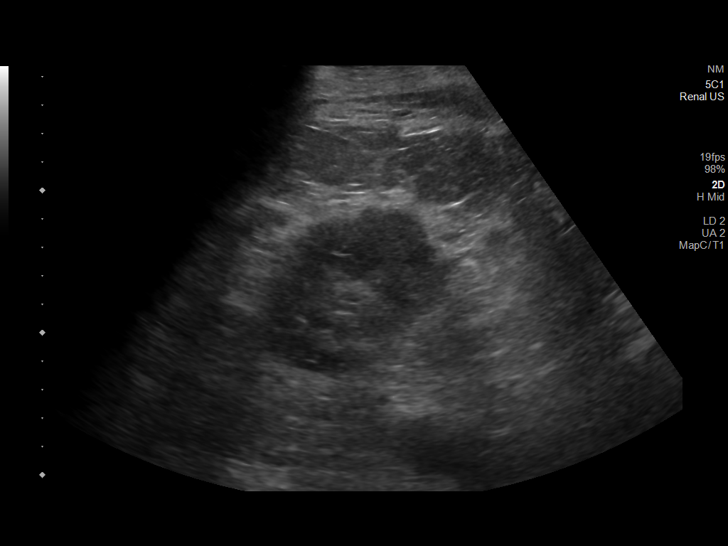
[im 14/42]
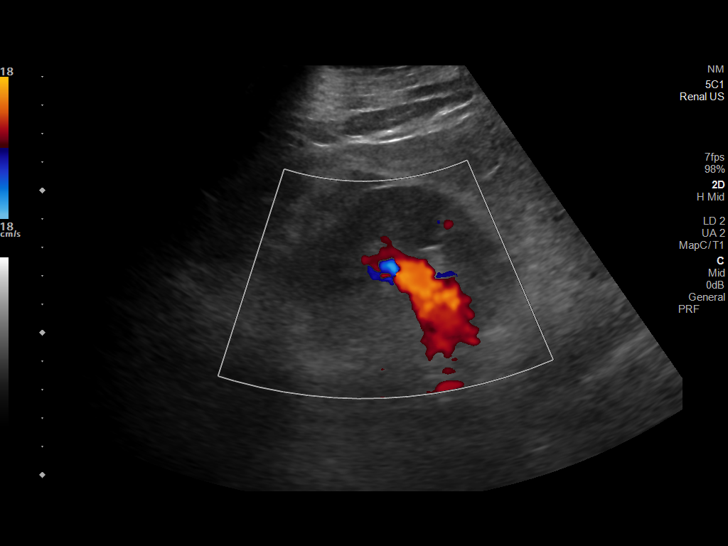
[im 16/42]
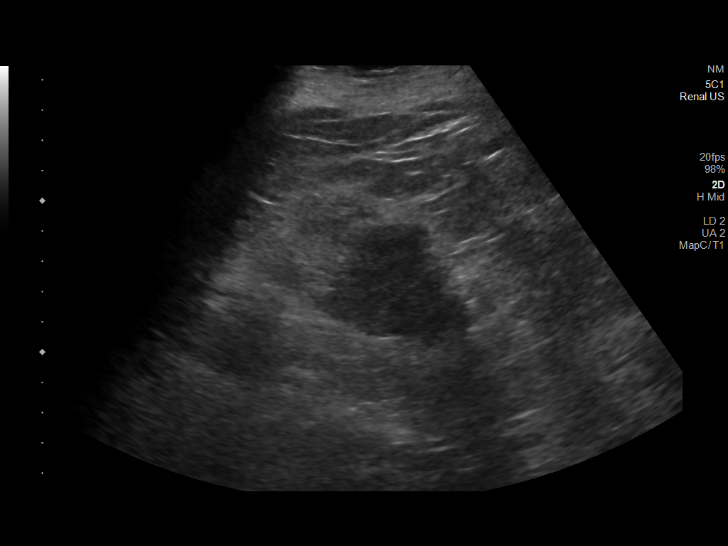
[im 19/42]
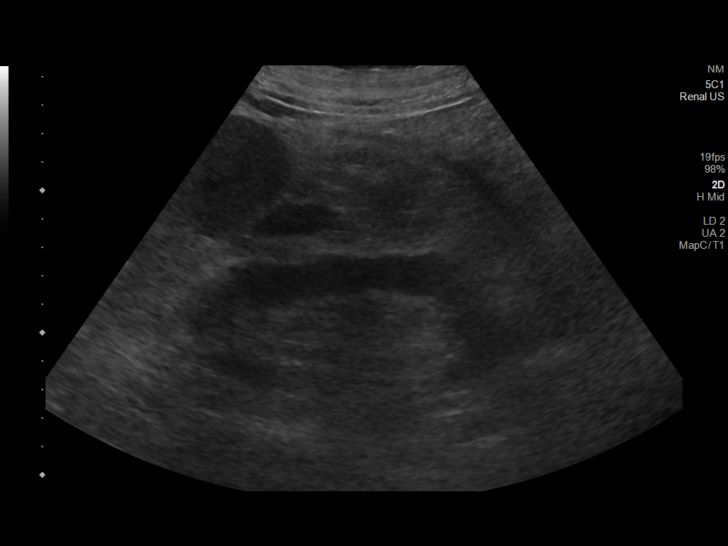
[im 23/42]
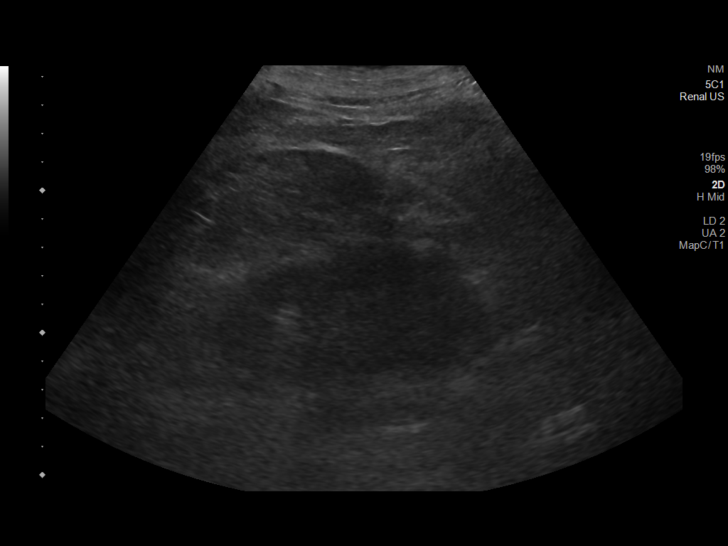
[im 26/42]
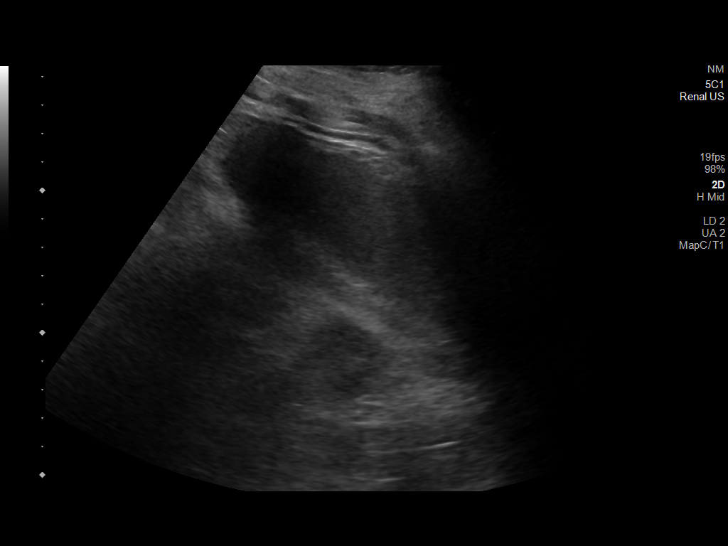
[im 28/42]
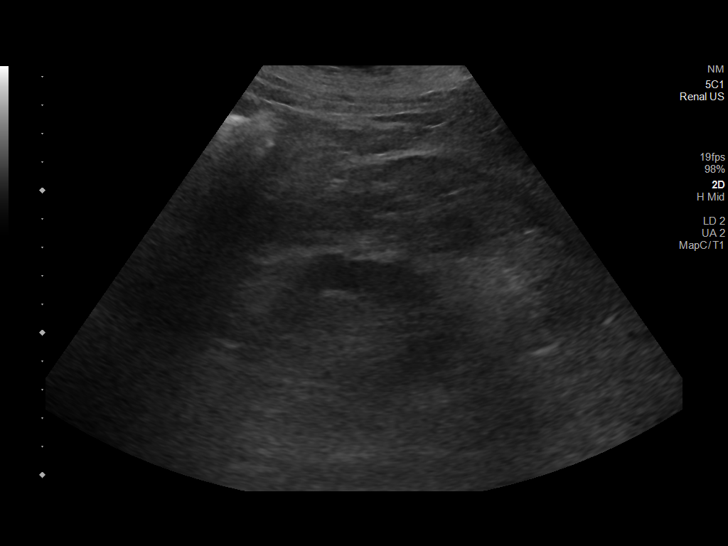
[im 31/42]
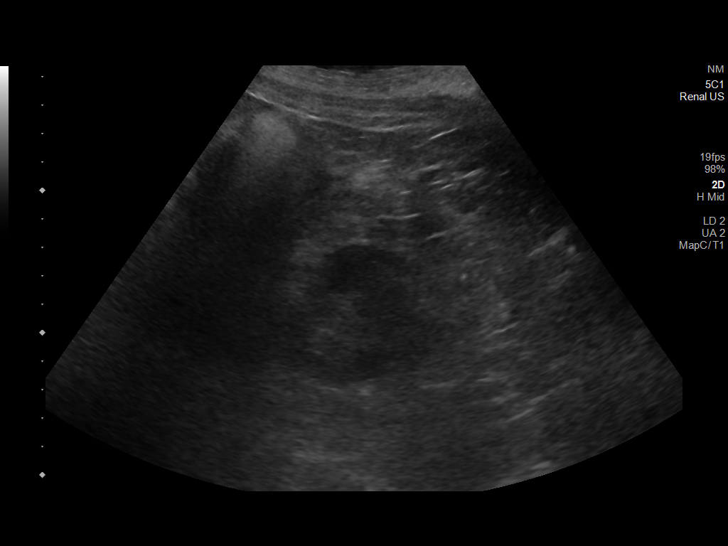
[im 35/42]
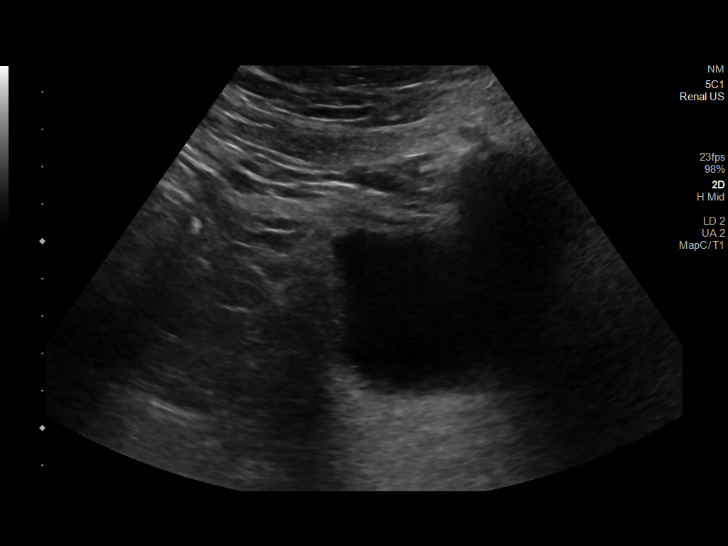
[im 38/42]
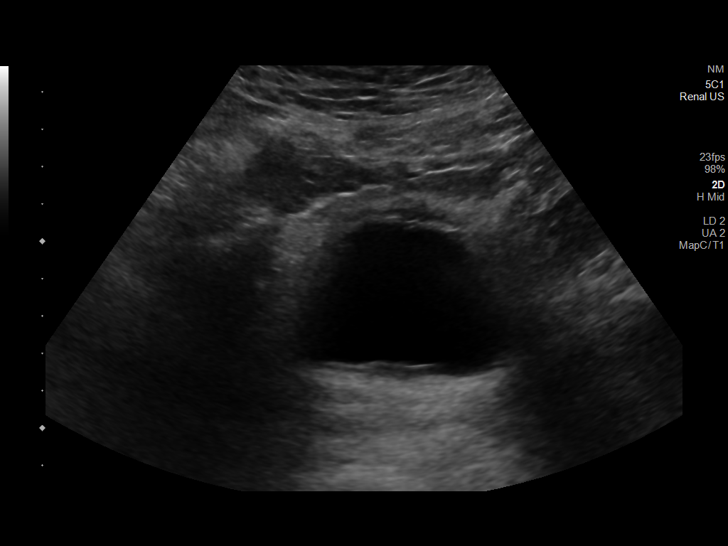
[im 42/42]
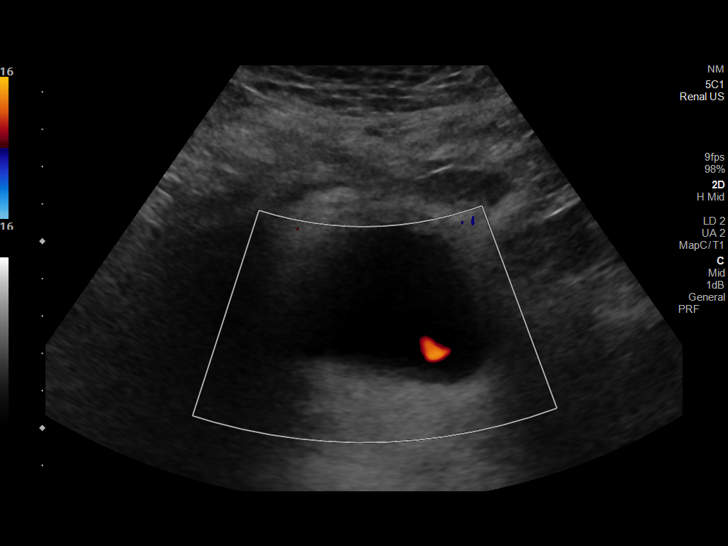

[14 of 25 positions shown; findings below may reference images not displayed]

FINDINGS: Right Kidney:

Renal measurements: 10.1 x 6.0 x 5.4 cm = volume: 169.0 mL .
Echogenicity within normal limits. No mass or hydronephrosis
visualized.

Left Kidney:

Renal measurements: 11.4 x 4.8 x 5.3 cm = volume: 149.9 mL. Contains
a small cyst in the lower pole.

Bladder:

The bladder is poorly distended limiting evaluation. The wall
appears prominent but this could be due to lack of distention.

Other:

None.
IMPRESSION: 1. The kidneys are normal in appearance. There is a small cyst in
the lower pole on the left.
2. Poor evaluation of the bladder due to lack of distention. Bladder
wall prominence is nonspecific but may be due to lack of distention.

## 2021-01-23 ENCOUNTER — Other Ambulatory Visit (HOSPITAL_COMMUNITY): Payer: Self-pay | Admitting: Cardiology

## 2021-01-23 DIAGNOSIS — I6521 Occlusion and stenosis of right carotid artery: Secondary | ICD-10-CM

## 2021-02-10 ENCOUNTER — Encounter (HOSPITAL_COMMUNITY): Payer: Medicare Other

## 2021-03-17 ENCOUNTER — Ambulatory Visit (HOSPITAL_COMMUNITY)
Admission: RE | Admit: 2021-03-17 | Discharge: 2021-03-17 | Disposition: A | Discharge status: Home / Self Care | Payer: Medicare Other | Source: Ambulatory Visit | Attending: Internal Medicine | Admitting: Internal Medicine

## 2021-03-17 ENCOUNTER — Other Ambulatory Visit: Payer: Self-pay

## 2021-03-17 DIAGNOSIS — I6521 Occlusion and stenosis of right carotid artery: Secondary | ICD-10-CM | POA: Diagnosis not present

## 2021-03-26 ENCOUNTER — Encounter: Payer: Self-pay | Admitting: *Deleted

## 2021-04-24 ENCOUNTER — Ambulatory Visit: Payer: Medicare Other | Admitting: Cardiology

## 2021-05-01 DIAGNOSIS — N189 Chronic kidney disease, unspecified: Secondary | ICD-10-CM | POA: Diagnosis not present

## 2021-05-01 DIAGNOSIS — N1832 Chronic kidney disease, stage 3b: Secondary | ICD-10-CM | POA: Diagnosis not present

## 2021-05-01 DIAGNOSIS — D631 Anemia in chronic kidney disease: Secondary | ICD-10-CM | POA: Diagnosis not present

## 2021-05-01 DIAGNOSIS — R7989 Other specified abnormal findings of blood chemistry: Secondary | ICD-10-CM | POA: Diagnosis not present

## 2021-05-01 DIAGNOSIS — I10 Essential (primary) hypertension: Secondary | ICD-10-CM | POA: Diagnosis not present

## 2021-05-04 ENCOUNTER — Other Ambulatory Visit (HOSPITAL_COMMUNITY): Payer: Self-pay | Admitting: Cardiology

## 2021-05-04 DIAGNOSIS — I6523 Occlusion and stenosis of bilateral carotid arteries: Secondary | ICD-10-CM

## 2021-05-26 DIAGNOSIS — H43813 Vitreous degeneration, bilateral: Secondary | ICD-10-CM | POA: Diagnosis not present

## 2021-05-26 DIAGNOSIS — H5703 Miosis: Secondary | ICD-10-CM | POA: Diagnosis not present

## 2021-05-26 DIAGNOSIS — E119 Type 2 diabetes mellitus without complications: Secondary | ICD-10-CM | POA: Diagnosis not present

## 2021-05-26 DIAGNOSIS — H35361 Drusen (degenerative) of macula, right eye: Secondary | ICD-10-CM | POA: Diagnosis not present

## 2021-05-29 DIAGNOSIS — I429 Cardiomyopathy, unspecified: Secondary | ICD-10-CM | POA: Diagnosis not present

## 2021-05-29 DIAGNOSIS — I4821 Permanent atrial fibrillation: Secondary | ICD-10-CM | POA: Diagnosis not present

## 2021-05-29 DIAGNOSIS — D631 Anemia in chronic kidney disease: Secondary | ICD-10-CM | POA: Diagnosis not present

## 2021-05-29 DIAGNOSIS — R7989 Other specified abnormal findings of blood chemistry: Secondary | ICD-10-CM | POA: Diagnosis not present

## 2021-05-29 DIAGNOSIS — I10 Essential (primary) hypertension: Secondary | ICD-10-CM | POA: Diagnosis not present

## 2021-05-29 DIAGNOSIS — E538 Deficiency of other specified B group vitamins: Secondary | ICD-10-CM | POA: Diagnosis not present

## 2021-05-29 DIAGNOSIS — G4733 Obstructive sleep apnea (adult) (pediatric): Secondary | ICD-10-CM | POA: Diagnosis not present

## 2021-05-29 DIAGNOSIS — N189 Chronic kidney disease, unspecified: Secondary | ICD-10-CM | POA: Diagnosis not present

## 2021-05-29 DIAGNOSIS — N1832 Chronic kidney disease, stage 3b: Secondary | ICD-10-CM | POA: Diagnosis not present

## 2021-05-29 DIAGNOSIS — D696 Thrombocytopenia, unspecified: Secondary | ICD-10-CM | POA: Diagnosis not present

## 2021-05-31 DIAGNOSIS — I6523 Occlusion and stenosis of bilateral carotid arteries: Secondary | ICD-10-CM

## 2021-05-31 HISTORY — DX: Occlusion and stenosis of bilateral carotid arteries: I65.23

## 2021-05-31 NOTE — Progress Notes (Signed)
?  ?Cardiology Office Note ? ? ?Date:  06/01/2021  ? ?ID:  Zachary Parrish, DOB [birth year], MRN [id number] ? ?PCP:  Jamesetta Geralds, MD  ?Cardiologist:   Minus Breeding, MD ? ?Chief Complaint  ?Patient presents with  ? Coronary Artery Disease  ? ? ?  ?History of Present Illness: ?Zachary Parrish is a 81 y.o. male who presents for follow up of CAD, He underwent cardiac catheterization in 2014 at [facility], this revealed 60 to 70% calcified left main lesion, high-grade ramus artery disease.  Medical therapy was recommended.  He had a history of dilated CM with improved EF eventually.  He was in the hospital in March with DRESS (drug reaction with eosinophilia and systemic symptom) secondary to allopurinol.   He had acute kidney injury and transaminitis. He was place on levophed, due to shock.  He had atrial fib and had DCCV.   ? ?Since I last saw him he has done well.  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  The patient denies any new symptoms such as chest discomfort, neck or arm discomfort. There has been no new shortness of breath, PND or orthopnea. There have been no reported palpitations, presyncope or syncope.  ? ?Past Medical History:  ?Diagnosis Date  ? CAD (coronary artery disease) 2014  ? 60-70% calcified LM lesion, high grade OM lesion, treated medically.  ? DM (diabetes mellitus) ([place])   ? no longer on medications  ? Gout   ? HTN (hypertension)   ? PAF (paroxysmal atrial fibrillation) ([place]) 06/03/2019  ? during admission for DRESS  ? ? ?Past Surgical History:  ?Procedure Laterality Date  ? CARDIAC CATHETERIZATION  2014  ? CARDIOVERSION N/A 06/07/2019  ? Procedure: CARDIOVERSION;  Surgeon: Donato Heinz, MD;  Location: [place];  Service: Cardiovascular;  Laterality: N/A;  ? TEE WITHOUT CARDIOVERSION N/A 06/07/2019  ? Procedure: TRANSESOPHAGEAL ECHOCARDIOGRAM (TEE);  Surgeon: Donato Heinz, MD;  Location: [place];  Service: Cardiovascular;  Laterality: N/A;   ? ? ? ?Current Outpatient Medications  ?Medication Sig Dispense Refill  ? aspirin 81 MG chewable tablet Chew 162 mg by mouth daily.    ? atorvastatin (LIPITOR) 40 MG tablet Take 40 mg by mouth daily.    ? carvedilol (COREG) 25 MG tablet Take 1 tablet (25 mg total) by mouth 2 (two) times daily. Hold dose if heart rate is less than 50    ? Coenzyme Q10 300 MG CAPS Take 1 capsule by mouth daily.    ? Cyanocobalamin (B-12 PO) Take by mouth.    ? furosemide (LASIX) 40 MG tablet Take 40 mg by mouth daily.     ? KLOR-CON M20 20 MEQ tablet Take 10 mEq by mouth daily. Patient takes 2 tablets daily. (71meq)    ? Multiple Vitamins-Minerals (OCUVITE PO) Take by mouth.    ? Omega-3 Fatty Acids (FISH OIL) 1000 MG CAPS Take 2 capsules by mouth daily.    ? telmisartan (MICARDIS) 20 MG tablet Take 20 mg by mouth daily.    ? ?No current facility-administered medications for this visit.  ? ? ?Allergies:   Allopurinol and Colchicine  ? ? ?ROS:  Please see the history of present illness.   Otherwise, review of systems are positive for none.   All other systems are reviewed and negative.  ? ? ?PHYSICAL EXAM: ?VS:  BP (!) 146/80   Pulse 69   Ht 6\' 1"  (1.854 m)   Wt 196 lb 3.2 oz (  89 kg)   SpO2 97%   BMI 25.89 kg/m?  , BMI Body mass index is 25.89 kg/m?. ?GENERAL:  Well appearing ?NECK:  No jugular venous distention, waveform within normal limits, carotid upstroke brisk and symmetric, positive right carotid bruit,  no thyromegaly ?LUNGS:  Clear to auscultation bilaterally ?CHEST:  Unremarkable ?HEART:  PMI not displaced or sustained,S1 and S2 within normal limits, no S3, no S4, no clicks, no rubs, no murmurs ?ABD:  Flat, positive bowel sounds normal in frequency in pitch, no bruits, no rebound, no guarding, no midline pulsatile mass, no hepatomegaly, no splenomegaly ?EXT:  2 plus pulses throughout, no edema, no cyanosis no clubbing ? ? ?EKG:  EKG is  ordered today. ?Normal sinus rhythm, rate 69, axis within normal limits, intervals  within normal limits, no acute ST-T wave changes. ? ? ?Recent Labs: ?No results found for requested labs within last 8760 hours.  ? ? ?Lipid Panel ?   ?Component Value Date/Time  ? CHOL 157 07/20/2019 1003  ? TRIG 130 07/20/2019 1003  ? HDL 32 (L) 07/20/2019 1003  ? CHOLHDL 4.9 07/20/2019 1003  ? LDLCALC 101 (H) 07/20/2019 1003  ? ?  ? ?Wt Readings from Last 3 Encounters:  ?06/01/21 196 lb 3.2 oz (89 kg)  ?11/23/19 189 lb (85.7 kg)  ?07/20/19 205 lb 14.4 oz (93.4 kg)  ?  ? ? ?Other studies Reviewed: ?Additional studies/ records that were reviewed today include: Labs ?Review of the above records demonstrates: See elsewhere ? ?ASSESSMENT AND PLAN: ? ?Paroxysmal atrial fibrillation:    He has had no further paroxysms.  No change in therapy or anticoagulation is indicated.  ? ?CAD:   He has no new symptoms and he will continue with risk reduction.  ? ?Hyperlipidemia:   LDL was 64 in September.  No change in therapy.   ? ?Hypertension:    The blood pressure is elevated today but he says this is not at all what he usually sees.  He will keep his home blood pressure diary and no change in therapy.  ? ?CKD:   Creat is 1.79 which is down from 1.81 previously.  No change in therapy.  ? ?Type II diabetes: A1c was most recently 5.7 which was down from 6.1 previously.  He is followed by endocrinology.   ? ?Carotid stenosis.  In Jan 2023 the right ICA had 60 - 79% stenosis.  I reviewed this for this appointment.  He will have a follow-up next January.  This is unchanged from 2021. ? ? ?Current medicines are reviewed at length with the patient today.  The patient does not have concerns regarding medicines. ? ?The following changes have been made: None ? ?Labs/ tests ordered today include:  ? ?Orders Placed This Encounter  ?Procedures  ? EKG 12-Lead  ? VAS US CAROTID  ? ? ? ?Disposition:   FU with me in 12 months.  ? ? ?Signed, ?Minus Breeding, MD  ?06/01/2021 12:04 PM    ?[place] ? ? ?

## 2021-06-01 ENCOUNTER — Encounter: Payer: Self-pay | Admitting: Cardiology

## 2021-06-01 ENCOUNTER — Other Ambulatory Visit: Payer: Self-pay

## 2021-06-01 ENCOUNTER — Ambulatory Visit: Payer: Medicare Other | Admitting: Cardiology

## 2021-06-01 VITALS — BP 146/80 | HR 69 | Ht 73.0 in | Wt 196.2 lb

## 2021-06-01 DIAGNOSIS — I480 Paroxysmal atrial fibrillation: Secondary | ICD-10-CM

## 2021-06-01 DIAGNOSIS — I10 Essential (primary) hypertension: Secondary | ICD-10-CM | POA: Diagnosis not present

## 2021-06-01 DIAGNOSIS — I2510 Atherosclerotic heart disease of native coronary artery without angina pectoris: Secondary | ICD-10-CM | POA: Diagnosis not present

## 2021-06-01 DIAGNOSIS — I6523 Occlusion and stenosis of bilateral carotid arteries: Secondary | ICD-10-CM

## 2021-06-01 DIAGNOSIS — E785 Hyperlipidemia, unspecified: Secondary | ICD-10-CM | POA: Diagnosis not present

## 2021-06-01 DIAGNOSIS — E118 Type 2 diabetes mellitus with unspecified complications: Secondary | ICD-10-CM

## 2021-06-01 NOTE — Patient Instructions (Signed)
Medication Instructions:  ?Continue same medications ?*If you need a refill on your cardiac medications before your next appointment, please call your pharmacy* ? ? ?Lab Work: ?None ordered ? ? ?Testing/Procedures: ?Schedule Carotid Dopplers 03/2022 ? ? ?Follow-Up: ?At [facility], you and your health needs are our priority.  As part of our continuing mission to provide you with exceptional heart care, we have created designated Provider Care Teams.  These Care Teams include your primary Cardiologist (physician) and Advanced Practice Providers (APPs -  Physician Assistants and Nurse Practitioners) who all work together to provide you with the care you need, when you need it. ? ?We recommend signing up for the patient portal called "MyChart".  Sign up information is provided on this After Visit Summary.  MyChart is used to connect with patients for Virtual Visits (Telemedicine).  Patients are able to view lab/test results, encounter notes, upcoming appointments, etc.  Non-urgent messages can be sent to your provider as well.   ?To learn more about what you can do with MyChart, go to [email or web address].   ? ?Your next appointment:  1 year ?  ? ?The format for your next appointment: Office   ? ? ?Provider:  Dr.Hochrein ? ? ?

## 2021-06-08 DIAGNOSIS — I2510 Atherosclerotic heart disease of native coronary artery without angina pectoris: Secondary | ICD-10-CM | POA: Diagnosis not present

## 2021-06-08 DIAGNOSIS — E538 Deficiency of other specified B group vitamins: Secondary | ICD-10-CM | POA: Diagnosis not present

## 2021-06-15 DIAGNOSIS — R809 Proteinuria, unspecified: Secondary | ICD-10-CM | POA: Diagnosis not present

## 2021-06-15 DIAGNOSIS — E785 Hyperlipidemia, unspecified: Secondary | ICD-10-CM | POA: Diagnosis not present

## 2021-06-15 DIAGNOSIS — I10 Essential (primary) hypertension: Secondary | ICD-10-CM | POA: Diagnosis not present

## 2021-06-15 DIAGNOSIS — I2510 Atherosclerotic heart disease of native coronary artery without angina pectoris: Secondary | ICD-10-CM | POA: Diagnosis not present

## 2021-07-06 DIAGNOSIS — B351 Tinea unguium: Secondary | ICD-10-CM | POA: Diagnosis not present

## 2021-07-06 DIAGNOSIS — I10 Essential (primary) hypertension: Secondary | ICD-10-CM | POA: Diagnosis not present

## 2021-07-06 DIAGNOSIS — Z79899 Other long term (current) drug therapy: Secondary | ICD-10-CM | POA: Diagnosis not present

## 2021-07-06 DIAGNOSIS — L84 Corns and callosities: Secondary | ICD-10-CM | POA: Diagnosis not present

## 2021-07-06 DIAGNOSIS — R234 Changes in skin texture: Secondary | ICD-10-CM | POA: Diagnosis not present

## 2021-08-31 DIAGNOSIS — D631 Anemia in chronic kidney disease: Secondary | ICD-10-CM | POA: Diagnosis not present

## 2021-08-31 DIAGNOSIS — I129 Hypertensive chronic kidney disease with stage 1 through stage 4 chronic kidney disease, or unspecified chronic kidney disease: Secondary | ICD-10-CM | POA: Diagnosis not present

## 2021-08-31 DIAGNOSIS — R7989 Other specified abnormal findings of blood chemistry: Secondary | ICD-10-CM | POA: Diagnosis not present

## 2021-08-31 DIAGNOSIS — N1832 Chronic kidney disease, stage 3b: Secondary | ICD-10-CM | POA: Diagnosis not present

## 2021-11-02 DIAGNOSIS — B351 Tinea unguium: Secondary | ICD-10-CM | POA: Diagnosis not present

## 2021-11-02 DIAGNOSIS — L84 Corns and callosities: Secondary | ICD-10-CM | POA: Diagnosis not present

## 2021-11-02 DIAGNOSIS — I10 Essential (primary) hypertension: Secondary | ICD-10-CM | POA: Diagnosis not present

## 2021-11-02 DIAGNOSIS — M1A9XX1 Chronic gout, unspecified, with tophus (tophi): Secondary | ICD-10-CM | POA: Diagnosis not present

## 2021-11-25 DIAGNOSIS — D696 Thrombocytopenia, unspecified: Secondary | ICD-10-CM | POA: Diagnosis not present

## 2021-11-25 DIAGNOSIS — M8949 Other hypertrophic osteoarthropathy, multiple sites: Secondary | ICD-10-CM | POA: Diagnosis not present

## 2021-11-25 DIAGNOSIS — D649 Anemia, unspecified: Secondary | ICD-10-CM | POA: Diagnosis not present

## 2021-11-25 DIAGNOSIS — M109 Gout, unspecified: Secondary | ICD-10-CM | POA: Diagnosis not present

## 2021-11-30 DIAGNOSIS — N189 Chronic kidney disease, unspecified: Secondary | ICD-10-CM | POA: Diagnosis not present

## 2021-11-30 DIAGNOSIS — D696 Thrombocytopenia, unspecified: Secondary | ICD-10-CM | POA: Diagnosis not present

## 2021-11-30 DIAGNOSIS — D631 Anemia in chronic kidney disease: Secondary | ICD-10-CM | POA: Diagnosis not present

## 2021-11-30 DIAGNOSIS — D638 Anemia in other chronic diseases classified elsewhere: Secondary | ICD-10-CM | POA: Diagnosis not present

## 2021-11-30 DIAGNOSIS — N1832 Chronic kidney disease, stage 3b: Secondary | ICD-10-CM | POA: Diagnosis not present

## 2021-11-30 DIAGNOSIS — E538 Deficiency of other specified B group vitamins: Secondary | ICD-10-CM | POA: Diagnosis not present

## 2022-01-18 DIAGNOSIS — R809 Proteinuria, unspecified: Secondary | ICD-10-CM | POA: Diagnosis not present

## 2022-01-18 DIAGNOSIS — R739 Hyperglycemia, unspecified: Secondary | ICD-10-CM | POA: Diagnosis not present

## 2022-01-18 DIAGNOSIS — I2510 Atherosclerotic heart disease of native coronary artery without angina pectoris: Secondary | ICD-10-CM | POA: Diagnosis not present

## 2022-01-25 DIAGNOSIS — Z23 Encounter for immunization: Secondary | ICD-10-CM | POA: Diagnosis not present

## 2022-01-25 DIAGNOSIS — I2510 Atherosclerotic heart disease of native coronary artery without angina pectoris: Secondary | ICD-10-CM | POA: Diagnosis not present

## 2022-01-25 DIAGNOSIS — R809 Proteinuria, unspecified: Secondary | ICD-10-CM | POA: Diagnosis not present

## 2022-01-25 DIAGNOSIS — E538 Deficiency of other specified B group vitamins: Secondary | ICD-10-CM | POA: Diagnosis not present

## 2022-01-25 DIAGNOSIS — E785 Hyperlipidemia, unspecified: Secondary | ICD-10-CM | POA: Diagnosis not present

## 2022-01-25 DIAGNOSIS — I10 Essential (primary) hypertension: Secondary | ICD-10-CM | POA: Diagnosis not present

## 2022-01-25 DIAGNOSIS — Z0000 Encounter for general adult medical examination without abnormal findings: Secondary | ICD-10-CM | POA: Diagnosis not present

## 2022-01-25 DIAGNOSIS — E039 Hypothyroidism, unspecified: Secondary | ICD-10-CM | POA: Diagnosis not present

## 2022-03-01 DIAGNOSIS — D631 Anemia in chronic kidney disease: Secondary | ICD-10-CM | POA: Diagnosis not present

## 2022-03-01 DIAGNOSIS — N189 Chronic kidney disease, unspecified: Secondary | ICD-10-CM | POA: Diagnosis not present

## 2022-03-11 DIAGNOSIS — B351 Tinea unguium: Secondary | ICD-10-CM | POA: Diagnosis not present

## 2022-03-11 DIAGNOSIS — L84 Corns and callosities: Secondary | ICD-10-CM | POA: Diagnosis not present

## 2022-03-11 DIAGNOSIS — M2041 Other hammer toe(s) (acquired), right foot: Secondary | ICD-10-CM | POA: Diagnosis not present

## 2022-03-11 DIAGNOSIS — E1142 Type 2 diabetes mellitus with diabetic polyneuropathy: Secondary | ICD-10-CM | POA: Diagnosis not present

## 2022-03-29 DIAGNOSIS — R809 Proteinuria, unspecified: Secondary | ICD-10-CM | POA: Diagnosis not present

## 2022-03-29 DIAGNOSIS — E1122 Type 2 diabetes mellitus with diabetic chronic kidney disease: Secondary | ICD-10-CM | POA: Diagnosis not present

## 2022-03-29 DIAGNOSIS — N179 Acute kidney failure, unspecified: Secondary | ICD-10-CM | POA: Diagnosis not present

## 2022-03-29 DIAGNOSIS — I129 Hypertensive chronic kidney disease with stage 1 through stage 4 chronic kidney disease, or unspecified chronic kidney disease: Secondary | ICD-10-CM | POA: Diagnosis not present

## 2022-03-29 DIAGNOSIS — N183 Chronic kidney disease, stage 3 unspecified: Secondary | ICD-10-CM | POA: Diagnosis not present

## 2022-05-31 ENCOUNTER — Ambulatory Visit (HOSPITAL_COMMUNITY)
Admission: RE | Admit: 2022-05-31 | Discharge: 2022-05-31 | Disposition: A | Discharge status: Home / Self Care | Payer: Medicare Other | Source: Ambulatory Visit | Attending: Internal Medicine | Admitting: Internal Medicine

## 2022-05-31 DIAGNOSIS — E785 Hyperlipidemia, unspecified: Secondary | ICD-10-CM | POA: Insufficient documentation

## 2022-05-31 DIAGNOSIS — I480 Paroxysmal atrial fibrillation: Secondary | ICD-10-CM | POA: Diagnosis not present

## 2022-05-31 DIAGNOSIS — E118 Type 2 diabetes mellitus with unspecified complications: Secondary | ICD-10-CM | POA: Insufficient documentation

## 2022-05-31 DIAGNOSIS — I10 Essential (primary) hypertension: Secondary | ICD-10-CM | POA: Diagnosis not present

## 2022-05-31 DIAGNOSIS — I2510 Atherosclerotic heart disease of native coronary artery without angina pectoris: Secondary | ICD-10-CM | POA: Insufficient documentation

## 2022-05-31 DIAGNOSIS — I6523 Occlusion and stenosis of bilateral carotid arteries: Secondary | ICD-10-CM | POA: Diagnosis not present

## 2022-06-03 DIAGNOSIS — R7989 Other specified abnormal findings of blood chemistry: Secondary | ICD-10-CM | POA: Diagnosis not present

## 2022-06-03 DIAGNOSIS — E538 Deficiency of other specified B group vitamins: Secondary | ICD-10-CM | POA: Diagnosis not present

## 2022-06-03 DIAGNOSIS — D638 Anemia in other chronic diseases classified elsewhere: Secondary | ICD-10-CM | POA: Diagnosis not present

## 2022-06-03 DIAGNOSIS — D6489 Other specified anemias: Secondary | ICD-10-CM | POA: Diagnosis not present

## 2022-06-03 DIAGNOSIS — I4821 Permanent atrial fibrillation: Secondary | ICD-10-CM | POA: Diagnosis not present

## 2022-06-03 DIAGNOSIS — N189 Chronic kidney disease, unspecified: Secondary | ICD-10-CM | POA: Diagnosis not present

## 2022-06-03 DIAGNOSIS — I2510 Atherosclerotic heart disease of native coronary artery without angina pectoris: Secondary | ICD-10-CM | POA: Diagnosis not present

## 2022-06-03 DIAGNOSIS — D696 Thrombocytopenia, unspecified: Secondary | ICD-10-CM | POA: Diagnosis not present

## 2022-06-03 DIAGNOSIS — E039 Hypothyroidism, unspecified: Secondary | ICD-10-CM | POA: Diagnosis not present

## 2022-06-03 DIAGNOSIS — D631 Anemia in chronic kidney disease: Secondary | ICD-10-CM | POA: Diagnosis not present

## 2022-06-03 DIAGNOSIS — N1832 Chronic kidney disease, stage 3b: Secondary | ICD-10-CM | POA: Diagnosis not present

## 2022-06-03 DIAGNOSIS — I10 Essential (primary) hypertension: Secondary | ICD-10-CM | POA: Diagnosis not present

## 2022-06-04 ENCOUNTER — Encounter: Payer: Self-pay | Admitting: *Deleted

## 2022-06-16 DIAGNOSIS — I10 Essential (primary) hypertension: Secondary | ICD-10-CM | POA: Diagnosis not present

## 2022-06-16 DIAGNOSIS — M1A9XX1 Chronic gout, unspecified, with tophus (tophi): Secondary | ICD-10-CM | POA: Diagnosis not present

## 2022-06-16 DIAGNOSIS — M2041 Other hammer toe(s) (acquired), right foot: Secondary | ICD-10-CM | POA: Diagnosis not present

## 2022-06-16 DIAGNOSIS — E1142 Type 2 diabetes mellitus with diabetic polyneuropathy: Secondary | ICD-10-CM | POA: Diagnosis not present

## 2022-06-16 DIAGNOSIS — B351 Tinea unguium: Secondary | ICD-10-CM | POA: Diagnosis not present

## 2022-06-16 DIAGNOSIS — L84 Corns and callosities: Secondary | ICD-10-CM | POA: Diagnosis not present

## 2022-06-16 DIAGNOSIS — M2042 Other hammer toe(s) (acquired), left foot: Secondary | ICD-10-CM | POA: Diagnosis not present

## 2022-07-09 DIAGNOSIS — E119 Type 2 diabetes mellitus without complications: Secondary | ICD-10-CM | POA: Diagnosis not present

## 2022-07-09 DIAGNOSIS — H35363 Drusen (degenerative) of macula, bilateral: Secondary | ICD-10-CM | POA: Diagnosis not present

## 2022-07-09 DIAGNOSIS — Z961 Presence of intraocular lens: Secondary | ICD-10-CM | POA: Diagnosis not present

## 2022-07-09 DIAGNOSIS — H5231 Anisometropia: Secondary | ICD-10-CM | POA: Diagnosis not present

## 2022-07-30 DIAGNOSIS — R051 Acute cough: Secondary | ICD-10-CM | POA: Diagnosis not present

## 2022-10-04 DIAGNOSIS — M79674 Pain in right toe(s): Secondary | ICD-10-CM | POA: Diagnosis not present

## 2022-10-04 DIAGNOSIS — M2041 Other hammer toe(s) (acquired), right foot: Secondary | ICD-10-CM | POA: Diagnosis not present

## 2022-10-04 DIAGNOSIS — E1142 Type 2 diabetes mellitus with diabetic polyneuropathy: Secondary | ICD-10-CM | POA: Diagnosis not present

## 2022-10-04 DIAGNOSIS — M1A9XX1 Chronic gout, unspecified, with tophus (tophi): Secondary | ICD-10-CM | POA: Diagnosis not present

## 2022-10-04 DIAGNOSIS — L84 Corns and callosities: Secondary | ICD-10-CM | POA: Diagnosis not present

## 2022-11-29 DIAGNOSIS — D649 Anemia, unspecified: Secondary | ICD-10-CM | POA: Diagnosis not present

## 2022-11-29 DIAGNOSIS — D696 Thrombocytopenia, unspecified: Secondary | ICD-10-CM | POA: Diagnosis not present

## 2022-11-29 DIAGNOSIS — E7849 Other hyperlipidemia: Secondary | ICD-10-CM | POA: Diagnosis not present

## 2022-11-29 DIAGNOSIS — M1009 Idiopathic gout, multiple sites: Secondary | ICD-10-CM | POA: Diagnosis not present

## 2022-11-29 DIAGNOSIS — M1A30X0 Chronic gout due to renal impairment, unspecified site, without tophus (tophi): Secondary | ICD-10-CM | POA: Diagnosis not present

## 2022-11-29 DIAGNOSIS — M8949 Other hypertrophic osteoarthropathy, multiple sites: Secondary | ICD-10-CM | POA: Diagnosis not present

## 2022-12-01 DIAGNOSIS — D638 Anemia in other chronic diseases classified elsewhere: Secondary | ICD-10-CM | POA: Diagnosis not present

## 2022-12-01 DIAGNOSIS — E538 Deficiency of other specified B group vitamins: Secondary | ICD-10-CM | POA: Diagnosis not present

## 2022-12-01 DIAGNOSIS — N189 Chronic kidney disease, unspecified: Secondary | ICD-10-CM | POA: Diagnosis not present

## 2022-12-01 DIAGNOSIS — D631 Anemia in chronic kidney disease: Secondary | ICD-10-CM | POA: Diagnosis not present

## 2022-12-01 DIAGNOSIS — N200 Calculus of kidney: Secondary | ICD-10-CM | POA: Diagnosis not present

## 2022-12-01 DIAGNOSIS — E039 Hypothyroidism, unspecified: Secondary | ICD-10-CM | POA: Diagnosis not present

## 2022-12-01 DIAGNOSIS — N1832 Chronic kidney disease, stage 3b: Secondary | ICD-10-CM | POA: Diagnosis not present

## 2022-12-01 DIAGNOSIS — R7989 Other specified abnormal findings of blood chemistry: Secondary | ICD-10-CM | POA: Diagnosis not present

## 2022-12-01 DIAGNOSIS — D696 Thrombocytopenia, unspecified: Secondary | ICD-10-CM | POA: Diagnosis not present

## 2022-12-01 DIAGNOSIS — I429 Cardiomyopathy, unspecified: Secondary | ICD-10-CM | POA: Diagnosis not present

## 2022-12-01 DIAGNOSIS — I2510 Atherosclerotic heart disease of native coronary artery without angina pectoris: Secondary | ICD-10-CM | POA: Diagnosis not present

## 2022-12-01 DIAGNOSIS — I10 Essential (primary) hypertension: Secondary | ICD-10-CM | POA: Diagnosis not present

## 2022-12-06 DIAGNOSIS — D638 Anemia in other chronic diseases classified elsewhere: Secondary | ICD-10-CM | POA: Diagnosis not present

## 2022-12-06 DIAGNOSIS — N1832 Chronic kidney disease, stage 3b: Secondary | ICD-10-CM | POA: Diagnosis not present

## 2022-12-06 DIAGNOSIS — I10 Essential (primary) hypertension: Secondary | ICD-10-CM | POA: Diagnosis not present

## 2023-01-17 DIAGNOSIS — E039 Hypothyroidism, unspecified: Secondary | ICD-10-CM | POA: Diagnosis not present

## 2023-01-17 DIAGNOSIS — I2510 Atherosclerotic heart disease of native coronary artery without angina pectoris: Secondary | ICD-10-CM | POA: Diagnosis not present

## 2023-01-17 DIAGNOSIS — R809 Proteinuria, unspecified: Secondary | ICD-10-CM | POA: Diagnosis not present

## 2023-01-24 DIAGNOSIS — R809 Proteinuria, unspecified: Secondary | ICD-10-CM | POA: Diagnosis not present

## 2023-01-24 DIAGNOSIS — I2510 Atherosclerotic heart disease of native coronary artery without angina pectoris: Secondary | ICD-10-CM | POA: Diagnosis not present

## 2023-01-24 DIAGNOSIS — E785 Hyperlipidemia, unspecified: Secondary | ICD-10-CM | POA: Diagnosis not present

## 2023-01-24 DIAGNOSIS — I10 Essential (primary) hypertension: Secondary | ICD-10-CM | POA: Diagnosis not present

## 2023-01-24 DIAGNOSIS — E039 Hypothyroidism, unspecified: Secondary | ICD-10-CM | POA: Diagnosis not present

## 2023-01-24 DIAGNOSIS — R739 Hyperglycemia, unspecified: Secondary | ICD-10-CM | POA: Diagnosis not present

## 2023-01-24 DIAGNOSIS — Z0000 Encounter for general adult medical examination without abnormal findings: Secondary | ICD-10-CM | POA: Diagnosis not present

## 2023-02-07 DIAGNOSIS — E1142 Type 2 diabetes mellitus with diabetic polyneuropathy: Secondary | ICD-10-CM | POA: Diagnosis not present

## 2023-02-07 DIAGNOSIS — M2041 Other hammer toe(s) (acquired), right foot: Secondary | ICD-10-CM | POA: Diagnosis not present

## 2023-02-07 DIAGNOSIS — L84 Corns and callosities: Secondary | ICD-10-CM | POA: Diagnosis not present

## 2023-02-07 DIAGNOSIS — L600 Ingrowing nail: Secondary | ICD-10-CM | POA: Diagnosis not present

## 2023-02-07 DIAGNOSIS — B351 Tinea unguium: Secondary | ICD-10-CM | POA: Diagnosis not present

## 2023-03-29 ENCOUNTER — Emergency Department (HOSPITAL_COMMUNITY): Payer: Medicare Other

## 2023-03-29 ENCOUNTER — Inpatient Hospital Stay (HOSPITAL_COMMUNITY): Payer: Medicare Other

## 2023-03-29 ENCOUNTER — Inpatient Hospital Stay (HOSPITAL_COMMUNITY)
Admission: EM | Admit: 2023-03-29 | Discharge: 2023-05-14 | DRG: 871 | Disposition: E | Payer: Medicare Other | Attending: Internal Medicine | Admitting: Internal Medicine

## 2023-03-29 ENCOUNTER — Other Ambulatory Visit: Payer: Self-pay

## 2023-03-29 ENCOUNTER — Encounter (HOSPITAL_COMMUNITY): Payer: Self-pay | Admitting: Pharmacy Technician

## 2023-03-29 DIAGNOSIS — R2689 Other abnormalities of gait and mobility: Secondary | ICD-10-CM | POA: Insufficient documentation

## 2023-03-29 DIAGNOSIS — R0602 Shortness of breath: Secondary | ICD-10-CM | POA: Diagnosis not present

## 2023-03-29 DIAGNOSIS — G8929 Other chronic pain: Secondary | ICD-10-CM | POA: Diagnosis present

## 2023-03-29 DIAGNOSIS — I472 Ventricular tachycardia, unspecified: Secondary | ICD-10-CM | POA: Diagnosis present

## 2023-03-29 DIAGNOSIS — Z452 Encounter for adjustment and management of vascular access device: Secondary | ICD-10-CM | POA: Diagnosis not present

## 2023-03-29 DIAGNOSIS — E871 Hypo-osmolality and hyponatremia: Secondary | ICD-10-CM | POA: Diagnosis not present

## 2023-03-29 DIAGNOSIS — E440 Moderate protein-calorie malnutrition: Secondary | ICD-10-CM | POA: Insufficient documentation

## 2023-03-29 DIAGNOSIS — I132 Hypertensive heart and chronic kidney disease with heart failure and with stage 5 chronic kidney disease, or end stage renal disease: Secondary | ICD-10-CM | POA: Diagnosis not present

## 2023-03-29 DIAGNOSIS — J189 Pneumonia, unspecified organism: Secondary | ICD-10-CM | POA: Insufficient documentation

## 2023-03-29 DIAGNOSIS — E8809 Other disorders of plasma-protein metabolism, not elsewhere classified: Secondary | ICD-10-CM | POA: Diagnosis present

## 2023-03-29 DIAGNOSIS — I4891 Unspecified atrial fibrillation: Secondary | ICD-10-CM | POA: Diagnosis present

## 2023-03-29 DIAGNOSIS — N179 Acute kidney failure, unspecified: Secondary | ICD-10-CM | POA: Diagnosis not present

## 2023-03-29 DIAGNOSIS — D631 Anemia in chronic kidney disease: Secondary | ICD-10-CM | POA: Diagnosis present

## 2023-03-29 DIAGNOSIS — I700 Atherosclerosis of aorta: Secondary | ICD-10-CM | POA: Diagnosis not present

## 2023-03-29 DIAGNOSIS — J9602 Acute respiratory failure with hypercapnia: Secondary | ICD-10-CM | POA: Diagnosis not present

## 2023-03-29 DIAGNOSIS — Z7409 Other reduced mobility: Secondary | ICD-10-CM | POA: Diagnosis present

## 2023-03-29 DIAGNOSIS — R34 Anuria and oliguria: Secondary | ICD-10-CM | POA: Diagnosis not present

## 2023-03-29 DIAGNOSIS — Z8249 Family history of ischemic heart disease and other diseases of the circulatory system: Secondary | ICD-10-CM

## 2023-03-29 DIAGNOSIS — J9811 Atelectasis: Secondary | ICD-10-CM | POA: Diagnosis present

## 2023-03-29 DIAGNOSIS — Z7189 Other specified counseling: Secondary | ICD-10-CM

## 2023-03-29 DIAGNOSIS — R0989 Other specified symptoms and signs involving the circulatory and respiratory systems: Secondary | ICD-10-CM | POA: Diagnosis not present

## 2023-03-29 DIAGNOSIS — N189 Chronic kidney disease, unspecified: Secondary | ICD-10-CM | POA: Insufficient documentation

## 2023-03-29 DIAGNOSIS — N170 Acute kidney failure with tubular necrosis: Secondary | ICD-10-CM | POA: Diagnosis not present

## 2023-03-29 DIAGNOSIS — J948 Other specified pleural conditions: Secondary | ICD-10-CM | POA: Diagnosis not present

## 2023-03-29 DIAGNOSIS — I5043 Acute on chronic combined systolic (congestive) and diastolic (congestive) heart failure: Secondary | ICD-10-CM | POA: Diagnosis not present

## 2023-03-29 DIAGNOSIS — I6523 Occlusion and stenosis of bilateral carotid arteries: Secondary | ICD-10-CM | POA: Diagnosis present

## 2023-03-29 DIAGNOSIS — R9389 Abnormal findings on diagnostic imaging of other specified body structures: Secondary | ICD-10-CM | POA: Diagnosis present

## 2023-03-29 DIAGNOSIS — R17 Unspecified jaundice: Secondary | ICD-10-CM | POA: Diagnosis present

## 2023-03-29 DIAGNOSIS — E1122 Type 2 diabetes mellitus with diabetic chronic kidney disease: Secondary | ICD-10-CM | POA: Diagnosis present

## 2023-03-29 DIAGNOSIS — M545 Low back pain, unspecified: Secondary | ICD-10-CM | POA: Diagnosis present

## 2023-03-29 DIAGNOSIS — R6521 Severe sepsis with septic shock: Secondary | ICD-10-CM | POA: Diagnosis not present

## 2023-03-29 DIAGNOSIS — J869 Pyothorax without fistula: Secondary | ICD-10-CM | POA: Diagnosis not present

## 2023-03-29 DIAGNOSIS — E875 Hyperkalemia: Secondary | ICD-10-CM | POA: Diagnosis not present

## 2023-03-29 DIAGNOSIS — J15211 Pneumonia due to Methicillin susceptible Staphylococcus aureus: Secondary | ICD-10-CM | POA: Diagnosis not present

## 2023-03-29 DIAGNOSIS — E785 Hyperlipidemia, unspecified: Secondary | ICD-10-CM | POA: Diagnosis present

## 2023-03-29 DIAGNOSIS — J90 Pleural effusion, not elsewhere classified: Secondary | ICD-10-CM | POA: Diagnosis not present

## 2023-03-29 DIAGNOSIS — F1010 Alcohol abuse, uncomplicated: Secondary | ICD-10-CM | POA: Diagnosis present

## 2023-03-29 DIAGNOSIS — R590 Localized enlarged lymph nodes: Secondary | ICD-10-CM | POA: Diagnosis present

## 2023-03-29 DIAGNOSIS — R29810 Facial weakness: Secondary | ICD-10-CM | POA: Diagnosis not present

## 2023-03-29 DIAGNOSIS — B9561 Methicillin susceptible Staphylococcus aureus infection as the cause of diseases classified elsewhere: Secondary | ICD-10-CM | POA: Diagnosis not present

## 2023-03-29 DIAGNOSIS — Z6830 Body mass index (BMI) 30.0-30.9, adult: Secondary | ICD-10-CM

## 2023-03-29 DIAGNOSIS — Z7982 Long term (current) use of aspirin: Secondary | ICD-10-CM

## 2023-03-29 DIAGNOSIS — Z79899 Other long term (current) drug therapy: Secondary | ICD-10-CM

## 2023-03-29 DIAGNOSIS — K5900 Constipation, unspecified: Secondary | ICD-10-CM | POA: Insufficient documentation

## 2023-03-29 DIAGNOSIS — I482 Chronic atrial fibrillation, unspecified: Secondary | ICD-10-CM | POA: Diagnosis not present

## 2023-03-29 DIAGNOSIS — Z4682 Encounter for fitting and adjustment of non-vascular catheter: Secondary | ICD-10-CM | POA: Diagnosis not present

## 2023-03-29 DIAGNOSIS — J9819 Other pulmonary collapse: Secondary | ICD-10-CM | POA: Diagnosis not present

## 2023-03-29 DIAGNOSIS — E1165 Type 2 diabetes mellitus with hyperglycemia: Secondary | ICD-10-CM | POA: Diagnosis present

## 2023-03-29 DIAGNOSIS — G4733 Obstructive sleep apnea (adult) (pediatric): Secondary | ICD-10-CM | POA: Diagnosis present

## 2023-03-29 DIAGNOSIS — I959 Hypotension, unspecified: Secondary | ICD-10-CM | POA: Diagnosis present

## 2023-03-29 DIAGNOSIS — L8990 Pressure ulcer of unspecified site, unspecified stage: Secondary | ICD-10-CM | POA: Insufficient documentation

## 2023-03-29 DIAGNOSIS — I5031 Acute diastolic (congestive) heart failure: Secondary | ICD-10-CM | POA: Diagnosis present

## 2023-03-29 DIAGNOSIS — R570 Cardiogenic shock: Secondary | ICD-10-CM | POA: Diagnosis not present

## 2023-03-29 DIAGNOSIS — R7401 Elevation of levels of liver transaminase levels: Secondary | ICD-10-CM | POA: Diagnosis present

## 2023-03-29 DIAGNOSIS — L89152 Pressure ulcer of sacral region, stage 2: Secondary | ICD-10-CM | POA: Diagnosis not present

## 2023-03-29 DIAGNOSIS — J9601 Acute respiratory failure with hypoxia: Secondary | ICD-10-CM | POA: Diagnosis not present

## 2023-03-29 DIAGNOSIS — I2489 Other forms of acute ischemic heart disease: Secondary | ICD-10-CM | POA: Diagnosis not present

## 2023-03-29 DIAGNOSIS — R339 Retention of urine, unspecified: Secondary | ICD-10-CM

## 2023-03-29 DIAGNOSIS — R109 Unspecified abdominal pain: Secondary | ICD-10-CM | POA: Diagnosis not present

## 2023-03-29 DIAGNOSIS — A4101 Sepsis due to Methicillin susceptible Staphylococcus aureus: Secondary | ICD-10-CM | POA: Diagnosis not present

## 2023-03-29 DIAGNOSIS — D638 Anemia in other chronic diseases classified elsewhere: Secondary | ICD-10-CM | POA: Diagnosis present

## 2023-03-29 DIAGNOSIS — R091 Pleurisy: Secondary | ICD-10-CM | POA: Diagnosis not present

## 2023-03-29 DIAGNOSIS — R079 Chest pain, unspecified: Secondary | ICD-10-CM | POA: Diagnosis not present

## 2023-03-29 DIAGNOSIS — I509 Heart failure, unspecified: Secondary | ICD-10-CM | POA: Diagnosis not present

## 2023-03-29 DIAGNOSIS — R918 Other nonspecific abnormal finding of lung field: Secondary | ICD-10-CM | POA: Diagnosis not present

## 2023-03-29 DIAGNOSIS — Z1624 Resistance to multiple antibiotics: Secondary | ICD-10-CM | POA: Diagnosis present

## 2023-03-29 DIAGNOSIS — K219 Gastro-esophageal reflux disease without esophagitis: Secondary | ICD-10-CM | POA: Diagnosis not present

## 2023-03-29 DIAGNOSIS — E43 Unspecified severe protein-calorie malnutrition: Secondary | ICD-10-CM | POA: Diagnosis not present

## 2023-03-29 DIAGNOSIS — I130 Hypertensive heart and chronic kidney disease with heart failure and stage 1 through stage 4 chronic kidney disease, or unspecified chronic kidney disease: Secondary | ICD-10-CM | POA: Diagnosis not present

## 2023-03-29 DIAGNOSIS — R54 Age-related physical debility: Secondary | ICD-10-CM | POA: Diagnosis present

## 2023-03-29 DIAGNOSIS — Z9889 Other specified postprocedural states: Secondary | ICD-10-CM

## 2023-03-29 DIAGNOSIS — F17220 Nicotine dependence, chewing tobacco, uncomplicated: Secondary | ICD-10-CM | POA: Diagnosis present

## 2023-03-29 DIAGNOSIS — I420 Dilated cardiomyopathy: Secondary | ICD-10-CM | POA: Diagnosis present

## 2023-03-29 DIAGNOSIS — Z8262 Family history of osteoporosis: Secondary | ICD-10-CM

## 2023-03-29 DIAGNOSIS — D696 Thrombocytopenia, unspecified: Secondary | ICD-10-CM | POA: Diagnosis not present

## 2023-03-29 DIAGNOSIS — Z992 Dependence on renal dialysis: Secondary | ICD-10-CM | POA: Diagnosis not present

## 2023-03-29 DIAGNOSIS — Z8619 Personal history of other infectious and parasitic diseases: Secondary | ICD-10-CM

## 2023-03-29 DIAGNOSIS — I5032 Chronic diastolic (congestive) heart failure: Secondary | ICD-10-CM | POA: Diagnosis not present

## 2023-03-29 DIAGNOSIS — M5126 Other intervertebral disc displacement, lumbar region: Secondary | ICD-10-CM | POA: Diagnosis not present

## 2023-03-29 DIAGNOSIS — A4901 Methicillin susceptible Staphylococcus aureus infection, unspecified site: Secondary | ICD-10-CM | POA: Diagnosis not present

## 2023-03-29 DIAGNOSIS — R5381 Other malaise: Secondary | ICD-10-CM | POA: Diagnosis not present

## 2023-03-29 DIAGNOSIS — H9190 Unspecified hearing loss, unspecified ear: Secondary | ICD-10-CM | POA: Diagnosis present

## 2023-03-29 DIAGNOSIS — Z66 Do not resuscitate: Secondary | ICD-10-CM

## 2023-03-29 DIAGNOSIS — Z515 Encounter for palliative care: Secondary | ICD-10-CM

## 2023-03-29 DIAGNOSIS — M48061 Spinal stenosis, lumbar region without neurogenic claudication: Secondary | ICD-10-CM | POA: Diagnosis not present

## 2023-03-29 DIAGNOSIS — R404 Transient alteration of awareness: Secondary | ICD-10-CM | POA: Diagnosis not present

## 2023-03-29 DIAGNOSIS — E874 Mixed disorder of acid-base balance: Secondary | ICD-10-CM | POA: Diagnosis not present

## 2023-03-29 DIAGNOSIS — R4182 Altered mental status, unspecified: Secondary | ICD-10-CM

## 2023-03-29 DIAGNOSIS — D6959 Other secondary thrombocytopenia: Secondary | ICD-10-CM | POA: Diagnosis present

## 2023-03-29 DIAGNOSIS — I469 Cardiac arrest, cause unspecified: Secondary | ICD-10-CM | POA: Diagnosis not present

## 2023-03-29 DIAGNOSIS — I9589 Other hypotension: Secondary | ICD-10-CM | POA: Diagnosis not present

## 2023-03-29 DIAGNOSIS — E079 Disorder of thyroid, unspecified: Secondary | ICD-10-CM | POA: Diagnosis present

## 2023-03-29 DIAGNOSIS — I4819 Other persistent atrial fibrillation: Secondary | ICD-10-CM | POA: Diagnosis not present

## 2023-03-29 DIAGNOSIS — M47816 Spondylosis without myelopathy or radiculopathy, lumbar region: Secondary | ICD-10-CM | POA: Diagnosis not present

## 2023-03-29 DIAGNOSIS — J939 Pneumothorax, unspecified: Secondary | ICD-10-CM | POA: Diagnosis not present

## 2023-03-29 DIAGNOSIS — E118 Type 2 diabetes mellitus with unspecified complications: Secondary | ICD-10-CM | POA: Diagnosis present

## 2023-03-29 DIAGNOSIS — R569 Unspecified convulsions: Secondary | ICD-10-CM | POA: Diagnosis not present

## 2023-03-29 DIAGNOSIS — I351 Nonrheumatic aortic (valve) insufficiency: Secondary | ICD-10-CM | POA: Diagnosis present

## 2023-03-29 DIAGNOSIS — R1312 Dysphagia, oropharyngeal phase: Secondary | ICD-10-CM | POA: Diagnosis not present

## 2023-03-29 DIAGNOSIS — E041 Nontoxic single thyroid nodule: Secondary | ICD-10-CM | POA: Diagnosis not present

## 2023-03-29 DIAGNOSIS — R251 Tremor, unspecified: Secondary | ICD-10-CM | POA: Insufficient documentation

## 2023-03-29 DIAGNOSIS — I5021 Acute systolic (congestive) heart failure: Secondary | ICD-10-CM | POA: Diagnosis not present

## 2023-03-29 DIAGNOSIS — N184 Chronic kidney disease, stage 4 (severe): Secondary | ICD-10-CM | POA: Diagnosis not present

## 2023-03-29 DIAGNOSIS — K5730 Diverticulosis of large intestine without perforation or abscess without bleeding: Secondary | ICD-10-CM | POA: Diagnosis not present

## 2023-03-29 DIAGNOSIS — I468 Cardiac arrest due to other underlying condition: Secondary | ICD-10-CM | POA: Diagnosis not present

## 2023-03-29 DIAGNOSIS — R059 Cough, unspecified: Secondary | ICD-10-CM | POA: Diagnosis not present

## 2023-03-29 DIAGNOSIS — E119 Type 2 diabetes mellitus without complications: Secondary | ICD-10-CM | POA: Diagnosis not present

## 2023-03-29 DIAGNOSIS — N186 End stage renal disease: Secondary | ICD-10-CM

## 2023-03-29 DIAGNOSIS — R000 Tachycardia, unspecified: Secondary | ICD-10-CM | POA: Diagnosis not present

## 2023-03-29 DIAGNOSIS — R262 Difficulty in walking, not elsewhere classified: Secondary | ICD-10-CM | POA: Diagnosis present

## 2023-03-29 DIAGNOSIS — R0609 Other forms of dyspnea: Secondary | ICD-10-CM | POA: Diagnosis not present

## 2023-03-29 DIAGNOSIS — A419 Sepsis, unspecified organism: Secondary | ICD-10-CM | POA: Diagnosis present

## 2023-03-29 DIAGNOSIS — I5082 Biventricular heart failure: Secondary | ICD-10-CM | POA: Diagnosis not present

## 2023-03-29 DIAGNOSIS — R627 Adult failure to thrive: Secondary | ICD-10-CM | POA: Diagnosis present

## 2023-03-29 DIAGNOSIS — R1310 Dysphagia, unspecified: Secondary | ICD-10-CM | POA: Diagnosis not present

## 2023-03-29 DIAGNOSIS — R161 Splenomegaly, not elsewhere classified: Secondary | ICD-10-CM | POA: Diagnosis present

## 2023-03-29 DIAGNOSIS — I517 Cardiomegaly: Secondary | ICD-10-CM | POA: Diagnosis not present

## 2023-03-29 DIAGNOSIS — I2510 Atherosclerotic heart disease of native coronary artery without angina pectoris: Secondary | ICD-10-CM | POA: Diagnosis present

## 2023-03-29 DIAGNOSIS — F05 Delirium due to known physiological condition: Secondary | ICD-10-CM | POA: Diagnosis not present

## 2023-03-29 DIAGNOSIS — M109 Gout, unspecified: Secondary | ICD-10-CM | POA: Diagnosis present

## 2023-03-29 DIAGNOSIS — Z7901 Long term (current) use of anticoagulants: Secondary | ICD-10-CM

## 2023-03-29 DIAGNOSIS — Z888 Allergy status to other drugs, medicaments and biological substances status: Secondary | ICD-10-CM

## 2023-03-29 LAB — I-STAT CHEM 8, ED
BUN: 48 mg/dL — ABNORMAL HIGH (ref 8–23)
Calcium, Ion: 1.16 mmol/L (ref 1.15–1.40)
Chloride: 97 mmol/L — ABNORMAL LOW (ref 98–111)
Creatinine, Ser: 2.8 mg/dL — ABNORMAL HIGH (ref 0.61–1.24)
Glucose, Bld: 156 mg/dL — ABNORMAL HIGH (ref 70–99)
HCT: 34 % — ABNORMAL LOW (ref 39.0–52.0)
Hemoglobin: 11.6 g/dL — ABNORMAL LOW (ref 13.0–17.0)
Potassium: 4.7 mmol/L (ref 3.5–5.1)
Sodium: 136 mmol/L (ref 135–145)
TCO2: 28 mmol/L (ref 22–32)

## 2023-03-29 LAB — CBC
HCT: 33.3 % — ABNORMAL LOW (ref 39.0–52.0)
Hemoglobin: 10.9 g/dL — ABNORMAL LOW (ref 13.0–17.0)
MCH: 32.8 pg (ref 26.0–34.0)
MCHC: 32.7 g/dL (ref 30.0–36.0)
MCV: 100.3 fL — ABNORMAL HIGH (ref 80.0–100.0)
Platelets: 147 10*3/uL — ABNORMAL LOW (ref 150–400)
RBC: 3.32 MIL/uL — ABNORMAL LOW (ref 4.22–5.81)
RDW: 17.1 % — ABNORMAL HIGH (ref 11.5–15.5)
WBC: 14 10*3/uL — ABNORMAL HIGH (ref 4.0–10.5)
nRBC: 0 % (ref 0.0–0.2)

## 2023-03-29 LAB — HEPATIC FUNCTION PANEL
ALT: 24 U/L (ref 0–44)
AST: 28 U/L (ref 15–41)
Albumin: 2.8 g/dL — ABNORMAL LOW (ref 3.5–5.0)
Alkaline Phosphatase: 84 U/L (ref 38–126)
Bilirubin, Direct: 0.7 mg/dL — ABNORMAL HIGH (ref 0.0–0.2)
Indirect Bilirubin: 1.2 mg/dL — ABNORMAL HIGH (ref 0.3–0.9)
Total Bilirubin: 1.9 mg/dL — ABNORMAL HIGH (ref 0.0–1.2)
Total Protein: 6.8 g/dL (ref 6.5–8.1)

## 2023-03-29 LAB — BASIC METABOLIC PANEL
Anion gap: 13 (ref 5–15)
BUN: 48 mg/dL — ABNORMAL HIGH (ref 8–23)
CO2: 26 mmol/L (ref 22–32)
Calcium: 8.8 mg/dL — ABNORMAL LOW (ref 8.9–10.3)
Chloride: 97 mmol/L — ABNORMAL LOW (ref 98–111)
Creatinine, Ser: 2.83 mg/dL — ABNORMAL HIGH (ref 0.61–1.24)
GFR, Estimated: 22 mL/min — ABNORMAL LOW (ref >60)
Glucose, Bld: 155 mg/dL — ABNORMAL HIGH (ref 70–99)
Potassium: 4.8 mmol/L (ref 3.5–5.1)
Sodium: 136 mmol/L (ref 135–145)

## 2023-03-29 LAB — BODY FLUID CELL COUNT WITH DIFFERENTIAL
Eos, Fluid: 0 %
Lymphs, Fluid: 1 %
Monocyte-Macrophage-Serous Fluid: 1 % — ABNORMAL LOW (ref 50–90)
Neutrophil Count, Fluid: 98 % — ABNORMAL HIGH (ref 0–25)
Total Nucleated Cell Count, Fluid: 10000 uL — ABNORMAL HIGH (ref 0–1000)

## 2023-03-29 LAB — LACTATE DEHYDROGENASE: LDH: 150 U/L (ref 98–192)

## 2023-03-29 LAB — TROPONIN I (HIGH SENSITIVITY)
Troponin I (High Sensitivity): 23 ng/L — ABNORMAL HIGH (ref <18)
Troponin I (High Sensitivity): 23 ng/L — ABNORMAL HIGH (ref <18)

## 2023-03-29 LAB — PROTEIN, PLEURAL OR PERITONEAL FLUID: Total protein, fluid: <3 g/dL

## 2023-03-29 LAB — LACTATE DEHYDROGENASE, PLEURAL OR PERITONEAL FLUID: LD, Fluid: 373 U/L — ABNORMAL HIGH (ref 3–23)

## 2023-03-29 LAB — PROTIME-INR
INR: 1.4 — ABNORMAL HIGH (ref 0.8–1.2)
Prothrombin Time: 17.4 s — ABNORMAL HIGH (ref 11.4–15.2)

## 2023-03-29 LAB — MAGNESIUM: Magnesium: 2.1 mg/dL (ref 1.7–2.4)

## 2023-03-29 LAB — HEMOGLOBIN A1C
Hgb A1c MFr Bld: 6.6 % — ABNORMAL HIGH (ref 4.8–5.6)
Mean Plasma Glucose: 142.72 mg/dL

## 2023-03-29 LAB — PROTEIN, TOTAL: Total Protein: 6.2 g/dL — ABNORMAL LOW (ref 6.5–8.1)

## 2023-03-29 LAB — GLUCOSE, PLEURAL OR PERITONEAL FLUID: Glucose, Fluid: <20 mg/dL

## 2023-03-29 LAB — TSH: TSH: 3.279 u[IU]/mL (ref 0.350–4.500)

## 2023-03-29 MED ORDER — OXYCODONE HCL 5 MG PO TABS
5.0000 mg | ORAL_TABLET | ORAL | Status: DC | PRN
  Administered 2023-03-30 – 2023-04-05 (×6): 5 mg via ORAL
  Filled 2023-03-29 (×6): qty 1

## 2023-03-29 MED ORDER — HEPARIN BOLUS VIA INFUSION
4000.0000 [IU] | Freq: Once | INTRAVENOUS | Status: DC
  Filled 2023-03-29: qty 4000

## 2023-03-29 MED ORDER — ATORVASTATIN CALCIUM 40 MG PO TABS
40.0000 mg | ORAL_TABLET | Freq: Every day | ORAL | Status: DC
  Administered 2023-03-29 – 2023-03-31 (×3): 40 mg via ORAL
  Filled 2023-03-29 (×3): qty 1

## 2023-03-29 MED ORDER — FUROSEMIDE 10 MG/ML IJ SOLN
80.0000 mg | Freq: Once | INTRAMUSCULAR | Status: AC
  Administered 2023-03-29: 80 mg via INTRAVENOUS
  Filled 2023-03-29: qty 8

## 2023-03-29 MED ORDER — ADULT MULTIVITAMIN W/MINERALS CH
1.0000 | ORAL_TABLET | Freq: Every day | ORAL | Status: DC
  Administered 2023-03-29 – 2023-03-31 (×3): 1 via ORAL
  Filled 2023-03-29 (×3): qty 1

## 2023-03-29 MED ORDER — METOPROLOL TARTRATE 5 MG/5ML IV SOLN: 5.0000 mg | Freq: Once | INTRAVENOUS | Status: DC

## 2023-03-29 MED ORDER — ACETAMINOPHEN 325 MG PO TABS
650.0000 mg | ORAL_TABLET | Freq: Four times a day (QID) | ORAL | Status: DC | PRN
  Administered 2023-03-30 – 2023-04-06 (×9): 650 mg via ORAL
  Filled 2023-03-29 (×9): qty 2

## 2023-03-29 MED ORDER — SODIUM CHLORIDE 0.9 % IV SOLN
1.0000 g | Freq: Once | INTRAVENOUS | Status: AC
  Administered 2023-03-29: 1 g via INTRAVENOUS
  Filled 2023-03-29: qty 10

## 2023-03-29 MED ORDER — ACETAMINOPHEN 650 MG RE SUPP: 650.0000 mg | Freq: Four times a day (QID) | RECTAL | Status: DC | PRN

## 2023-03-29 MED ORDER — ASPIRIN 81 MG PO CHEW
81.0000 mg | CHEWABLE_TABLET | Freq: Every day | ORAL | Status: DC
  Administered 2023-03-30 – 2023-03-31 (×2): 81 mg via ORAL
  Filled 2023-03-29 (×3): qty 1

## 2023-03-29 MED ORDER — SODIUM CHLORIDE 0.9% FLUSH
10.0000 mL | Freq: Three times a day (TID) | INTRAVENOUS | Status: DC
  Administered 2023-03-29 – 2023-04-04 (×16): 10 mL via INTRAPLEURAL

## 2023-03-29 MED ORDER — THIAMINE MONONITRATE 100 MG PO TABS
100.0000 mg | ORAL_TABLET | Freq: Every day | ORAL | Status: DC
  Administered 2023-03-29 – 2023-03-31 (×3): 100 mg via ORAL
  Filled 2023-03-29 (×3): qty 1

## 2023-03-29 MED ORDER — HEPARIN (PORCINE) 25000 UT/250ML-% IV SOLN
1400.0000 [IU]/h | INTRAVENOUS | Status: DC
  Administered 2023-03-29 – 2023-03-30 (×3): 1400 [IU]/h via INTRAVENOUS
  Filled 2023-03-29 (×2): qty 250

## 2023-03-29 MED ORDER — FOLIC ACID 1 MG PO TABS
1.0000 mg | ORAL_TABLET | Freq: Every day | ORAL | Status: DC
  Administered 2023-03-29 – 2023-03-31 (×3): 1 mg via ORAL
  Filled 2023-03-29 (×3): qty 1

## 2023-03-29 MED ORDER — METOPROLOL TARTRATE 25 MG PO TABS
25.0000 mg | ORAL_TABLET | Freq: Once | ORAL | Status: AC
  Administered 2023-03-29: 25 mg via ORAL
  Filled 2023-03-29: qty 1

## 2023-03-29 MED ORDER — SODIUM CHLORIDE 0.9 % IV SOLN
500.0000 mg | Freq: Once | INTRAVENOUS | Status: AC
  Administered 2023-03-29: 500 mg via INTRAVENOUS
  Filled 2023-03-29: qty 5

## 2023-03-29 NOTE — Hospital Course (Addendum)
 Zachary Parrish is a 83 y.o.male with a history of A-fib, CAD, HTN DM, HTN, CKD, and anemia who was admitted to the [facility] at [facility] for acute hypoxic respiratory failure secondary to acute heart failure and a large pleural effusion. His hospital course is detailed below:  Acute Hypoxic Respiratory Failure  MSSA Empyema Patient presented to ED with increased work of breathing over the course of the past couple months but recently worsened.  Initial CXR with large right pleural effusion. Patient was diuresed and a chest tube was placed 1/14. Pleural field fluid culture grew MSSA.blood cultures were obtained after antibiotic administration.  Patient had a history of MSSA bacteremia.  On 1/15 patient had septic shock requiring ICU admission for pressors intermittently but was able to transition back to the floor off of pressors once more hemodynamically stable 1/27.  Abx given for MSSA empyema  Initially given linezolid  and ceftriaxone . ID transitioned to cefazolin  from ceftriaxone  and linezolid .  Did have repeat fever on 1/29 and was brought into [facility]  and vancomycin .  Repeat cultures with no growth and ultimately ID recommended 3-week course of antibiotics at discharge using cefazolin .  Anuria  Acute Renal Failure  In setting of septic shock as note above. Started on CRRT and was unable to start voiding again, prompting continuation of intermittent HD as he had electrolyte derangements without HD.  Patient was not able to tolerate HD sessions well***and palliative care was consulted who recommended***.  Afib RVR Uncontrolled initially and cardiology was consulted, likely in setting of acute illness.  Was placed on amiodarone  400 mg twice daily. HF team recommended transition from heparin  drip to DOAC. They will attempt TEE/DC-CV before discharge when stable or as an outpatient***.  Acute diastolic heart failure  Echo demonstrated LVEF 50-55%, moderate LVH, RV mildly enlarged, LA  and RA mildly dilated, and trivial MVR. Discharged on ***lasix .  Alcohol  use disorder Required phenobarbital  taper in ICU.  Recovered well outside of withdrawal window.  Dysphagia  Malnutrition Required Cortrack + tube feeds inpatient. MBS showed esophageal dysfunction is primary problem with mental status exacerbation. Given diet for pleasure and with goals of care showed ***  Decreased mobility and endurance Likely 2/2 deconditioning in addition to prolonged hospital stay and illness. Was recommended *** by PT.  Other chronic conditions were medically managed with home medications and formulary alternatives as necessary (T2DM, chronic back pain, gout,)  PCP Follow-up Recommendations: Outpatient thyroid  ultrasound and TSH -incidental thyroid  lesion 2.4 cm cystic on right thyroid  lobe Pulmonary follow up    DC plan: if on iHD - cefazolin  with HD x4-6 weeks from chest tube removal through 3/4, if not on iHD: cefadroxil renally dosed

## 2023-03-29 NOTE — Assessment & Plan Note (Signed)
 Questionable urinary retention. Reports increased fluid intake, but decreased output. - Bladder scan

## 2023-03-29 NOTE — ED Notes (Signed)
Echo in progress at bedside.

## 2023-03-29 NOTE — Assessment & Plan Note (Addendum)
 Rate controlled, however hypotensive.  Hold metoprolol, may need Amio. - Cardiology consulted, appreciate recommendations - Heparin gtt - Cardiac monitoring

## 2023-03-29 NOTE — ED Provider Notes (Signed)
 [place] EMERGENCY DEPARTMENT AT [facility] Provider Note   CSN: [id number] Arrival date & time: 03/29/23  9147     History  Chief Complaint  Patient presents with   Shortness of Breath   Fatigue   Back Pain    Zachary Parrish is a 83 y.o. male.  HPI Patient presents for multiple complaints.  Medical includes atrial fibrillation, CAD, DM, HTN, CKD, anemia.  Home medications include aspirin , Coreg , Lasix .  He is not on anticoagulation.  Over the past 3 months, he has had ongoing bilateral lower back pain.  This is worsened with standing and ambulation.  Pain is not present at rest or laying down.  Additionally, over the past 2 months, he has had fatigue and shortness of breath, worsened with exertion.  He states that he has had less urine output lately despite drinking lots of fluids.  He has had poor appetite.    Home Medications Prior to Admission medications   Medication Sig Start Date End Date Taking? Authorizing Provider  aspirin  81 MG chewable tablet Chew 162 mg by mouth daily.    [provider]  atorvastatin  (LIPITOR) 40 MG tablet Take 40 mg by mouth daily. 03/27/19   [provider]  carvedilol  (COREG ) 25 MG tablet Take 1 tablet (25 mg total) by mouth 2 (two) times daily. Hold dose if heart rate is less than 50 06/07/19   Rockey, Tyrone A, DO  Coenzyme Q10 300 MG CAPS Take 1 capsule by mouth daily.    [provider]  Cyanocobalamin  (B-12 PO) Take by mouth.    [provider]  furosemide  (LASIX ) 40 MG tablet Take 40 mg by mouth daily.     [provider]  KLOR-CON M20 20 MEQ tablet Take 10 mEq by mouth daily. Patient takes 2 tablets daily. ( ) 01/09/19   [provider]  Multiple Vitamins-Minerals (OCUVITE PO) Take by mouth.    [provider]  Omega-3 Fatty Acids (FISH OIL) 1000 MG CAPS Take 2 capsules by mouth daily.    [provider]  telmisartan (MICARDIS) 20 MG tablet Take 20 mg by mouth  daily. 03/09/21   [provider]      Allergies    Allopurinol and Colchicine    Review of Systems   Review of Systems  Constitutional:  Positive for activity change, appetite change and fatigue.  Respiratory:  Positive for shortness of breath.   Cardiovascular:  Positive for leg swelling.  Genitourinary:  Positive for decreased urine volume.  Musculoskeletal:  Positive for back pain.  All other systems reviewed and are negative.   Physical Exam Updated Vital Signs BP (!) 105/93   Pulse (!) 113   Temp 97.7 F (36.5 C) (Oral)   Resp (!) 28   SpO2 100%  Physical Exam Vitals and nursing note reviewed.  Constitutional:      General: He is not in acute distress.    Appearance: Normal appearance. He is well-developed. He is not ill-appearing, toxic-appearing or diaphoretic.  HENT:     Head: Normocephalic and atraumatic.     Right Ear: External ear normal.     Left Ear: External ear normal.     Nose: Nose normal.     Mouth/Throat:     Mouth: Mucous membranes are moist.  Eyes:     Extraocular Movements: Extraocular movements intact.     Conjunctiva/sclera: Conjunctivae normal.  Cardiovascular:     Rate and Rhythm: Normal rate and regular rhythm.  Heart sounds: No murmur heard. Pulmonary:     Effort: Pulmonary effort is normal. No respiratory distress.     Breath sounds: Normal breath sounds. No wheezing or rales.  Chest:     Chest wall: No tenderness.  Abdominal:     General: There is no distension.     Palpations: Abdomen is soft.     Tenderness: There is no abdominal tenderness.  Musculoskeletal:        General: No swelling or deformity.     Cervical back: Normal range of motion and neck supple.     Right lower leg: Edema present.     Left lower leg: Edema present.  Skin:    General: Skin is warm and dry.     Coloration: Skin is not jaundiced or pale.  Neurological:     General: No focal deficit present.     Mental Status: He is alert and oriented  to person, place, and time.  Psychiatric:        Mood and Affect: Mood normal.        Behavior: Behavior normal.     ED Results / Procedures / Treatments   Labs (all labs ordered are listed, but only abnormal results are displayed) Labs Reviewed  BASIC METABOLIC PANEL - Abnormal; Notable for the following components:      Result Value   Chloride 97 (*)    Glucose, Bld 155 (*)    BUN 48 (*)    Creatinine, Ser 2.83 (*)    Calcium  8.8 (*)    GFR, Estimated 22 (*)    All other components within normal limits  CBC - Abnormal; Notable for the following components:   WBC 14.0 (*)    RBC 3.32 (*)    Hemoglobin 10.9 (*)    HCT 33.3 (*)    MCV 100.3 (*)    RDW 17.1 (*)    Platelets 147 (*)    All other components within normal limits  HEPATIC FUNCTION PANEL - Abnormal; Notable for the following components:   Albumin  2.8 (*)    Total Bilirubin 1.9 (*)    Bilirubin, Direct 0.7 (*)    Indirect Bilirubin 1.2 (*)    All other components within normal limits  I-STAT CHEM 8, ED - Abnormal; Notable for the following components:   Chloride 97 (*)    BUN 48 (*)    Creatinine, Ser 2.80 (*)    Glucose, Bld 156 (*)    Hemoglobin 11.6 (*)    HCT 34.0 (*)    All other components within normal limits  TROPONIN I (HIGH SENSITIVITY) - Abnormal; Notable for the following components:   Troponin I (High Sensitivity) 23 (*)    All other components within normal limits  MAGNESIUM   URINALYSIS, ROUTINE W REFLEX MICROSCOPIC  BRAIN NATRIURETIC PEPTIDE  TROPONIN I (HIGH SENSITIVITY)    EKG EKG Interpretation Date/Time:  Tuesday March 29 2023 09:03:44 EST Ventricular Rate:  122 PR Interval:    QRS Duration:  88 QT Interval:  332 QTC Calculation: 473 R Axis:   26  Text Interpretation: Atrial fibrillation with rapid ventricular response Cannot rule out Anterior infarct , age undetermined Abnormal ECG Confirmed by Melvenia Motto [phone]) on 03/29/2023 9:12:00 AM  Radiology CT CHEST WO  CONTRAST Result Date: 03/29/2023 CLINICAL DATA:  Respiratory illness. EXAM: CT CHEST WITHOUT CONTRAST TECHNIQUE: Multidetector CT imaging of the chest was performed following the standard protocol without IV contrast. RADIATION DOSE REDUCTION: This exam was performed according  to the departmental dose-optimization program which includes automated exposure control, adjustment of the mA and/or kV according to patient size and/or use of iterative reconstruction technique. COMPARISON:  X-ray earlier 03/29/2023. FINDINGS: Cardiovascular: Trace pericardial fluid or thickening. Heart slightly enlarged. Coronary artery calcifications are seen. Prominent epicardial fat. The thoracic aorta has a overall normal course and caliber with scattered diffuse calcified plaque. There is a bovine type aortic arch, normal variant. Significant stenosis suggested at the origin of the right subclavian artery and right common carotid artery additional evaluation with CTA can be performed when clinically appropriate. There is also significant plaque along the left common carotid artery origin. Mediastinum/Nodes: Slightly patulous thoracic esophagus with some luminal gas. Small thyroid  gland with a cystic nodule involving the right thyroid  lobe measuring 2.4 by 1.4 cm. Recommend further workup with thyroid  ultrasound when appropriate if there is no known history. On this non IV contrast exam there is no specific abnormal lymph node enlargement present in the axillary regions hilum. Several small less than 1 cm in size in short axis nodes are seen in the mediastinum, nonpathologic by size criteria. There is some edema along the mediastinum inferiorly on the right side. Prominent supraclavicular node on the left on series 3, image 9 has short axis of 10 mm. Lungs/Pleura: There is a calcified left lower lobe lung nodule series 3, image 81 consistent with old granulomatous disease. Tiny left pleural effusion. No left-sided consolidation,  pneumothorax otherwise. Right lung has a large pleural effusion with pleural thickening. The inferior aspect is incompletely included in the imaging field. Opacity seen in the visualized portions of the right lung with a rounded areas of opacity along the right lower lobe. Although this could be an infiltrate trait this also could be an area of rounded atelectasis. Upper Abdomen: Limited visualization of the upper abdomen. The adrenal glands are incompletely included in the imaging field. Spleen is enlarged measuring at least 15.5 cm where visible. Musculoskeletal: Mild degenerative changes along the spine. Old left chronic appearing rib deformities. IMPRESSION: Large right pleural effusion with pleural thickening. Associated opacity including rounded areas of opacity in the right lower lobe. Although this could be an infiltrate or lesion this could be an area of rounded atelectasis. Please correlate for any known history or prior. Very small left pleural effusion. Splenomegaly. Incompletely included in the imaging field. Further workup when clinically appropriate. 2.4 cm cystic lesion in the right thyroid  lobe. Please correlate with any prior dedicated thyroid  ultrasound when appropriate. Enlarged heart with the extensive vascular calcifications including potential significant stenosis along the great vessels including brachiocephalic, common carotid and right subclavian arteries. Further workup with CTA when clinically appropriate. Nonspecific mediastinal stranding with some small nodes including 1 slightly abnormally enlarged supraclavicular node on the left side. Aortic Atherosclerosis (ICD10-I70.0). Electronically Signed   By: Ranell Bring M.D.   On: 03/29/2023 11:42   DG Chest 2 View Result Date: 03/29/2023 CLINICAL DATA:  Shortness of breath for 3 weeks EXAM: CHEST - 2 VIEW COMPARISON:  05/31/2019 FINDINGS: Large right pleural effusion. Small left. Underinflation with enlarged heart. Calcified aorta. No  pneumothorax or edema. Film is under penetrated. IMPRESSION: Large right pleural effusion and tiny left. Underinflation. Enlarged cardiopericardial silhouette. Please correlate with history or further workup when appropriate. Electronically Signed   By: Ranell Bring M.D.   On: 03/29/2023 10:05    Procedures Procedures    Medications Ordered in ED Medications  cefTRIAXone  (ROCEPHIN ) 1 g in sodium chloride  0.9 % 100  mL IVPB (1 g Intravenous New Bag/Given 03/29/23 1206)  azithromycin  (ZITHROMAX ) 500 mg in sodium chloride  0.9 % 250 mL IVPB (has no administration in time range)  furosemide  (LASIX ) injection 80 mg (80 mg Intravenous Given 03/29/23 1050)  metoprolol  tartrate (LOPRESSOR ) tablet 25 mg (25 mg Oral Given 03/29/23 1051)    ED Course/ Medical Decision Making/ A&P                                 Medical Decision Making Amount and/or Complexity of Data Reviewed Labs: ordered. Radiology: ordered.  Risk Prescription drug management. Decision regarding hospitalization.   This patient presents to the ED for concern of fatigue and shortness of breath, this involves an extensive number of treatment options, and is a complaint that carries with it a high risk of complications and morbidity.  The differential diagnosis includes CHF, pneumonia, pleural effusion, pericardial effusion, acidosis, anemia, other metabolic derangements, arrhythmia   Co morbidities that complicate the patient evaluation  atrial fibrillation, CAD, DM, HTN, CKD, anemia   Additional history obtained:  Additional history obtained from N/A External records from outside source obtained and reviewed including EMR   Lab Tests:  I Ordered, and personally interpreted labs.  The pertinent results include: Baseline creatinine, baseline anemia.  A leukocytosis is present.  Troponin is high-normal.   Imaging Studies ordered:  I ordered imaging studies including chest x-ray, CT chest I independently visualized and  interpreted imaging which showed large right-sided pleural effusion; associated RLL opacity I agree with the radiologist interpretation   Cardiac Monitoring: / EKG:  The patient was maintained on a cardiac monitor.  I personally viewed and interpreted the cardiac monitored which showed an underlying rhythm of: Atrial fibrillation  Problem List / ED Course / Critical interventions / Medication management  Patient presenting for progressive symptoms of bilateral low back pain, shortness of breath, fatigue, exercise intolerance.  He does have a history of paroxysmal atrial fibrillation.  In the past, he has typically been in a sinus rhythm and has not been on anticoagulation.  On arrival in the ED today, initial EKG does show atrial fibrillation with RVR.  This may certainly have been contributing to his symptoms.  Given unknown timeline and absence of anticoagulation, patient not a candidate for cardioversion at this time.  Given his current soft blood pressures, will allow permissive tachycardia at this time.  Current heart rate is in the range of 110.  On exam he is overall well-appearing.  He has mildly increased work of breathing but he is able to speak in complete sentences.  He has diminished breath sounds on lung auscultation.  Although SpO2 is normal on room air, SpO2 did drop into the high 80s and he was placed on 2 L of supplemental oxygen.  He does have bilateral pitting edema on his legs.  Patient was placed on bedside cardiac monitor.  Workup was initiated.  X-ray shows a large right pleural effusion and enlarged cardiac silhouette.  On bedside ultrasound, I do not appreciate any significant pericardial effusion.  Patient's creatinine appears to be baseline.  Dose of IV Lasix  was ordered.  Patient was also given dose of metoprolol  for gentle rate control.  CT scan showed redemonstration of large right-sided pleural effusion.  There is RLL opacity which could be infiltrate.  Will cover with  antibiotics for now.  Patient does state that he has had cough lately productive of thick,  white sputum.  Patient was admitted for further management. I ordered medication including heparin  for atrial fibrillation, metoprolol  for rate control; Lasix  for diuresis; azithromycin  and ceftriaxone  for empiric treatment of pneumonia Reevaluation of the patient after these medicines showed that the patient improved I have reviewed the patients home medicines and have made adjustments as needed   Social Determinants of Health:  Has access to outpatient care  CRITICAL CARE Performed by: Bernardino Fireman   Total critical care time: 32 minutes  Critical care time was exclusive of separately billable procedures and treating other patients.  Critical care was necessary to treat or prevent imminent or life-threatening deterioration.  Critical care was time spent personally by me on the following activities: development of treatment plan with patient and/or surrogate as well as nursing, discussions with consultants, evaluation of patient's response to treatment, examination of patient, obtaining history from patient or surrogate, ordering and performing treatments and interventions, ordering and review of laboratory studies, ordering and review of radiographic studies, pulse oximetry and re-evaluation of patient's condition.          Final Clinical Impression(s) / ED Diagnoses Final diagnoses:  Pleural effusion  Atrial fibrillation with RVR (HCC)  Acute respiratory failure with hypoxia [facility])    Rx / DC Orders ED Discharge Orders     None         Fireman Bernardino, MD 03/29/23 1215

## 2023-03-29 NOTE — ED Notes (Signed)
 Notified attending team of pt vital signs and bladder scan result 0mL x 2 attempts.

## 2023-03-29 NOTE — Procedures (Signed)
 Insertion of Chest Tube Procedure Note  Zachary Parrish  [id number]  [birth year]  Date:03/29/23  Time:5:39 PM    Provider Performing: Jeralyn FORBES Banner   Procedure: Chest Tube Insertion (32551)  Indication(s) Effusion  Consent Risks of the procedure as well as the alternatives and risks of each were explained to the patient and/or caregiver.  Consent for the procedure was obtained and is signed in the bedside chart  Anesthesia Topical only with 1% lidocaine     Time Out Verified patient identification, verified procedure, site/side was marked, verified correct patient position, special equipment/implants available, medications/allergies/relevant history reviewed, required imaging and test results available.   Sterile Technique Maximal sterile technique including full sterile barrier drape, hand hygiene, sterile gown, sterile gloves, mask, hair covering, sterile ultrasound probe cover (if used).   Procedure Description Ultrasound used to identify appropriate pleural anatomy for placement and overlying skin marked. Area of placement cleaned and draped in sterile fashion.  A 14 French pigtail pleural catheter was placed into the right pleural space using Seldinger technique. Appropriate return of fluid was obtained.  The tube was connected to atrium and placed on -20 cm H2O wall suction.   Complications/Tolerance None; patient tolerated the procedure well. Chest X-ray is ordered to verify placement.   EBL Minimal  Specimen(s) fluid

## 2023-03-29 NOTE — ED Notes (Signed)
 Pt to CT

## 2023-03-29 NOTE — Progress Notes (Signed)
 PHARMACY - ANTICOAGULATION CONSULT NOTE  Pharmacy Consult for heparin  Indication: atrial fibrillation  Allergies  Allergen Reactions   Allopurinol     Possible DRESS syndrome   Colchicine     Possible DRESS syndrome    Patient Measurements: Height: 6' 1 (185.4 cm) Weight: 95.3 kg (210 lb) IBW/kg (Calculated) : 79.9 Heparin  Dosing Weight: 95 kg  Vital Signs: Temp: 97.7 F (36.5 C) (01/14 0932) Temp Source: Oral (01/14 0932) BP: 105/93 (01/14 1051) Pulse Rate: 113 (01/14 1051)  Labs: Recent Labs    03/29/23 0903 03/29/23 1017 03/29/23 1130  HGB 10.9* 11.6*  --   HCT 33.3* 34.0*  --   PLT 147*  --   --   CREATININE 2.83* 2.80*  --   TROPONINIHS 23*  --  23*    Estimated Creatinine Clearance: 23.4 mL/min (A) (by C-G formula based on SCr of 2.8 mg/dL (H)).   Medical History: Past Medical History:  Diagnosis Date   CAD (coronary artery disease) 2014   60-70% calcified LM lesion, high grade OM lesion, treated medically.   DM (diabetes mellitus) (HCC)    no longer on medications   Gout    HTN (hypertension)    PAF (paroxysmal atrial fibrillation) (HCC) 06/03/2019   during admission for DRESS     Assessment: 42 yoM presenting to ED with atrial fibrillation. NO oral anticoagulation reported prior to admission. Pharmacy consulted to manage heparin  infusion.    Goal of Therapy:  Heparin  level 0.3-0.7 units/ml Monitor platelets by anticoagulation protocol: Yes   Plan:  Give 4000 units bolus x 1 Start heparin  infusion at 1400 units/hr Check anti-Xa level in 8 hours and daily while on heparin  Continue to monitor H&H and platelets  Koren Or, PharmD Clinical Pharmacist 03/29/2023 1:02 PM Please check AMION for all [facility] Pharmacy numbers

## 2023-03-29 NOTE — ED Notes (Signed)
 Pt to Xray.

## 2023-03-29 NOTE — Assessment & Plan Note (Signed)
 Likely in the setting of CKD. Receives ESA if Hgb <10.  - Monitor with AM CBC w/ diff

## 2023-03-29 NOTE — Assessment & Plan Note (Addendum)
 BP 70-120s/50-90s. Reports being normotensive at home with his medications - Coreg 25 mg BID and Telmisartan 20 mg daily for HTN. - Cardiology consulted, appreciate recs - Hold home meds - Hold diuresis given hypotension

## 2023-03-29 NOTE — ED Notes (Signed)
 Admitting provider at bedside and aware of pt BP 73/54 (62). RN will await orders and continue to monitor.

## 2023-03-29 NOTE — Plan of Care (Addendum)
 FMTS Brief Progress Note  S:Pt states his SOB is much improved and he is feeling okay. No complaints.    O: BP (!) 83/60   Pulse (!) 102   Temp 98.3 F (36.8 C) (Oral)   Resp (!) 21   Ht 6' 1 (1.854 m)   Wt 95.3 kg   SpO2 100%   BMI 27.71 kg/m   General: Pt lying in bed, sleeping but easy to wake Cards: irregularly irregular Lungs: breathing comfortably on  2.5 L . L CTA, R with suction sounds of chest tube. Chest tube in place set on suction, no air leak,  ~ 1250 cc drained Extremities: 3+ ptting edema BLEs. Bilateral feet cold to touch with cap refill ~4sec. Dorsalis pedis pulses not palpable but found with doppler.   A/P: Acute hypoxic resporatory failure R pleural effusion s/p chest tube placement  Heart failure exacerbation  Continue plan as in H&P and PCCM notes (f/u cx, abx, chest tube, diuresis, f/u echo)  A fib w/ RVR Bps continue to be soft, MAPs have been >60.  Cannot tolerate metoprolol . On heparin  gtt w/ plan for likely TEE cardioversion  Can consider starting amiodarone . Low threshold to consult CCM for pressors if needed   AKI on CKD 3b No urine on bladder scan s/p IV lasix .  Renal US  showing L renal cortical thinning, otherwise unremarkable with no hydronephrosis  CTM with bladder scans F/u am BMP  Alcohol  use - RUQ US  unremarkable, monitor KARLENE Joshua Domino, DO 03/29/2023, 10:57 PM PGY-3, [facility] Night Resident  Please page [phone] with questions.

## 2023-03-29 NOTE — ED Notes (Signed)
 [place] at bedside

## 2023-03-29 NOTE — Assessment & Plan Note (Addendum)
 CT chest demonstrated large R pleural effusion with pleural thickening and very small left pleural effusion. Extensive differential discussed above.  S/p IV Lasix  80 mg.  - Pulmonology consulted, appreciate recommendations - Consider thoracentesis - Continue ceftriaxone  and azithromycin  (1/14- ) - Daily weights - Strict I/Os

## 2023-03-29 NOTE — Assessment & Plan Note (Signed)
 TSH 2.346 3 years ago. CT chest w/o contrast revealed 2.4 cm cystic lesion in the right thyroid lobe.  - TSH

## 2023-03-29 NOTE — Consult Note (Addendum)
 NAME:  Zachary Parrish, MRN:  [id number], DOB:  1940/06/19, LOS: 0 ADMISSION DATE:  03/29/2023, CONSULTATION DATE:  1/14 REFERRING MD:  pRAY, CHIEF COMPLAINT:  EFFUSION    History of Present Illness:  This is a pleasant yet chronically ill-appearing 83 year old male who presents to the emergency room with chief complaint of approximately 2 and half months of progressive exertional dyspnea.  Initially noted shortness of breath back in early November primarily with activity, around Thanksgiving time reporting fairly significant increased work of breathing to the point that family noticed, of note about 1 month prior to that certain to notice some mild increase lower extremity edema.  By Christmas time his shortness of breath had gotten to the point of resting dyspnea, he was also transition to 4 pillow orthopnea and at times would actually go out and sleep in his car to be in the upright position.  He has had productive cough with clear mucus, no chest pain, no palpitations.  No sick exposures.  His legs have become more painful over the last week, and he presents to the emergency room primarily because he can only speak in 2-3 word phrases and was significantly weak and orthopneic.  In the ER Evaluation Was initiated this demonstrated the following: Serum creatinine 2.83 is not far from his baseline, glucose 155 white blood cell count 14 troponin I of 23, a chest x-ray was obtained this demonstrated a large right pleural effusion because of this a CT of chest was obtained to further evaluate.  CT of chest demonstrated a large right pleural effusion with pleural thickening and rind, some basilar consolidated atelectasis which appeared rounded in nature, and a very small left pleural effusion he also had a 2.5 cm cystic lesion on his right thyroid .  Because of the pleural effusion and dyspnea pulmonary was asked to evaluate.  Pertinent  Medical History  Atrial fibrillation not on anticoagulation but has had prior  cardioversion, carotid stenosis, CKD stage IIIb, hyperlipidemia, coronary artery disease, type 2 diabetes, hypertension  Significant Hospital Events: Including procedures, antibiotic start and stop dates in addition to other pertinent events   1/14 admitted with progressive subacute dyspnea and large chronic appearing pleural effusion with rind on the right.  Thoracostomy tube placed in the emergency room  Interim History / Subjective:  Short of breath  Objective   Blood pressure (!) 104/58, pulse 95, temperature (!) 97.2 F (36.2 C), temperature source Axillary, resp. rate (!) 24, height 6' 1 (1.854 m), weight 95.3 kg, SpO2 91%.        Intake/Output Summary (Last 24 hours) at 03/29/2023 1721 Last data filed at 03/29/2023 1508 Gross per 24 hour  Intake 350 ml  Output --  Net 350 ml   Filed Weights   03/29/23 1200  Weight: 95.3 kg    Examination: General: 83 year old male patient resting in bed currently no acute distress HENT: Normocephalic atraumatic mucous membranes moist Lungs: Diminished bilaterally, slightly tachypneic.  Point-of-care ultrasound showing a moderate-sized right pleural effusion,  appearing chronic in nature with fibrinous strands post thoracostomy tube chest x-ray showed improved aeration and decreased level of effusion with small right apical lucency but no airleak noted on drainage system Cardiovascular: Regular irregular with atrial fibrillation Abdomen: Soft tender Extremities: 4+ pitting edema cool Neuro: Awake oriented generalized weakness GU: Due to void  Resolved Hospital Problem list     Assessment & Plan:  Acute hypoxic respiratory failure Large complicated right pleural effusion now status post tube thoracostomy 1/14 Small  ex vacuo pneumothorax Acute heart failure Volume overload Atrial fibrillation Cardiorenal syndrome CKD stage IIIb Leukocytosis Thyroid  lesion Anemia of chronic disease History of alcohol  abuse Urinary  retention   Pulmonary problem list Subacute hypoxic respiratory failure in the setting of large right pleural effusion and associated atelectasis. Trapped lung Volume overload  Pleural fluid appears exudative in nature.  I do suspect at least some of his presentation is cardiorenal in nature, however given how chronic the right pleural effusion appears I suspect this is an exudative process.  Differential includes infectious, inflammatory, or malignancy.  Given the chronicity of the effusion we elected to place a chest tube with concern that his right lung may not reexpand quickly, and there is small lucency noted on his post film  Plan Keeping chest tube to suction As needed analgesia Sending pleural fluid analysis for infection, inflammation, and malignancy Empiric antibiotics initiated by primary team, reasonable for now Follow-up chest x-ray Agree with diuresis as tolerated Echocardiogram will be helpful Would not be surprised if he requires pleural lytics given the chronic appearance of this effusion   Best Practice (right click and Reselect all SmartList Selections daily)  PER PRIMARY   Labs   CBC: Recent Labs  Lab 03/29/23 0903 03/29/23 1017  WBC 14.0*  --   HGB 10.9* 11.6*  HCT 33.3* 34.0*  MCV 100.3*  --   PLT 147*  --     Basic Metabolic Panel: Recent Labs  Lab 03/29/23 0903 03/29/23 0928 03/29/23 1017  NA 136  --  136  K 4.8  --  4.7  CL 97*  --  97*  CO2 26  --   --   GLUCOSE 155*  --  156*  BUN 48*  --  48*  CREATININE 2.83*  --  2.80*  CALCIUM  8.8*  --   --   MG  --  2.1  --    GFR: Estimated Creatinine Clearance: 23.4 mL/min (A) (by C-G formula based on SCr of 2.8 mg/dL (H)). Recent Labs  Lab 03/29/23 0903  WBC 14.0*    Liver Function Tests: Recent Labs  Lab 03/29/23 0903  AST 28  ALT 24  ALKPHOS 84  BILITOT 1.9*  PROT 6.8  ALBUMIN  2.8*   No results for input(s): LIPASE, AMYLASE in the last 168 hours. No results for  input(s): AMMONIA in the last 168 hours.  ABG    Component Value Date/Time   PHART 7.295 (L) 05/31/2019 2340   PCO2ART 30.2 (L) 05/31/2019 2340   PO2ART 82.0 (L) 05/31/2019 2340   HCO3 14.7 (L) 05/31/2019 2340   TCO2 28 03/29/2023 1017   ACIDBASEDEF 11.0 (H) 05/31/2019 2340   O2SAT 95.0 05/31/2019 2340     Coagulation Profile: No results for input(s): INR, PROTIME in the last 168 hours.  Cardiac Enzymes: No results for input(s): CKTOTAL, CKMB, CKMBINDEX, TROPONINI in the last 168 hours.  HbA1C: Hgb A1c MFr Bld  Date/Time Value Ref Range Status  06/01/2019 03:39 AM 7.2 (H) 4.8 - 5.6 % Final    Comment:    (NOTE) Pre diabetes:          5.7%-6.4% Diabetes:              >6.4% Glycemic control for   <7.0% adults with diabetes     CBG: No results for input(s): GLUCAP in the last 168 hours.  Review of Systems:   Review of Systems  Constitutional:  Positive for fever and malaise/fatigue.  HENT: Negative.  Eyes: Negative.   Respiratory:  Positive for cough, sputum production and shortness of breath.   Cardiovascular:  Positive for leg swelling. Negative for chest pain.  Gastrointestinal: Negative.   Genitourinary: Negative.   Musculoskeletal: Negative.   Skin: Negative.   Neurological: Negative.   Endo/Heme/Allergies: Negative.   Psychiatric/Behavioral: Negative.       Past Medical History:  He,  has a past medical history of CAD (coronary artery disease) (2014), DM (diabetes mellitus) (HCC), Gout, HTN (hypertension), and PAF (paroxysmal atrial fibrillation) (HCC) (06/03/2019).   Surgical History:   Past Surgical History:  Procedure Laterality Date   CARDIAC CATHETERIZATION  2014   CARDIOVERSION N/A 06/07/2019   Procedure: CARDIOVERSION;  Surgeon: Kate Lonni CROME, MD;  Location: [facility] ENDOSCOPY;  Service: Cardiovascular;  Laterality: N/A;   TEE WITHOUT CARDIOVERSION N/A 06/07/2019   Procedure: TRANSESOPHAGEAL ECHOCARDIOGRAM (TEE);  Surgeon:  Kate Lonni CROME, MD;  Location: [facility] ENDOSCOPY;  Service: Cardiovascular;  Laterality: N/A;     Social History:   reports that he has quit smoking. He uses smokeless tobacco. He reports current alcohol  use. He reports that he does not use drugs.   Family History:  His family history includes Heart attack in his father; Osteoporosis in his mother.   Allergies Allergies  Allergen Reactions   Allopurinol     Possible DRESS syndrome   Colchicine     Possible DRESS syndrome     Home Medications  Prior to Admission medications   Medication Sig Start Date End Date Taking? Authorizing Provider  aspirin  81 MG chewable tablet Chew 162 mg by mouth daily.   Yes [provider]  atorvastatin  (LIPITOR) 40 MG tablet Take 40 mg by mouth daily. 03/27/19  Yes [provider]  carvedilol  (COREG ) 25 MG tablet Take 1 tablet (25 mg total) by mouth 2 (two) times daily. Hold dose if heart rate is less than 50 06/07/19  Yes Kyle, Tyrone A, DO  Coenzyme Q10 300 MG CAPS Take 1 capsule by mouth daily.   Yes [provider]  Cyanocobalamin  (B-12 PO) Take by mouth.   Yes [provider]  furosemide  (LASIX ) 40 MG tablet Take 40 mg by mouth daily.    Yes [provider]  Multiple Vitamins-Minerals (OCUVITE PO) Take 1 capsule by mouth at bedtime.   Yes [provider]  Omega-3 Fatty Acids (FISH OIL) 1000 MG CAPS Take 2,000 capsules by mouth 2 (two) times daily.   Yes [provider]  potassium chloride (MICRO-K) 10 MEQ CR capsule Take 20 mEq by mouth 2 (two) times daily.   Yes [provider]  telmisartan (MICARDIS) 40 MG tablet Take 40 mg by mouth daily.   Yes [provider]     Critical care time: na

## 2023-03-29 NOTE — ED Triage Notes (Addendum)
 Pt here with reports of shob, difficulty ambulating, fatigue and lower back pain. Symptoms gradually worsening over the last few weeks. Also states today is the first day he has urinated in the last few days (normally urinates several times daily).

## 2023-03-29 NOTE — ED Notes (Signed)
 Pt reports last ETOH intake was "several days ago" but is unable to provide an exact date.

## 2023-03-29 NOTE — ED Notes (Signed)
 Patient transported to X-ray

## 2023-03-29 NOTE — Assessment & Plan Note (Signed)
 Hx of alcohol use daily with vodka.  - CIWAs without atiivan - PT/INR - RUQ [place] to assess for cirrhosis - Folate - MV - Thiamine

## 2023-03-29 NOTE — Assessment & Plan Note (Addendum)
 Increased work of breathing, on 2L LFNC saturating at 100%.  S/p IV Lasix  80 mg.  Difficulty speaking full sentences. - Admit to FMTS, attending Dr. Donzetta - Pulmonology consulted, appreciate recommendations - PT/OT to treat - Echocardiogram - Supplemental O2 - Consider further diuresis [facility] pulmonology

## 2023-03-29 NOTE — Consult Note (Addendum)
 Cardiology Consultation   Patient ID: Zachary Parrish MRN: [id number]; DOB: Jul 03, 1940  Admit date: 03/29/2023 Date of Consult: 03/29/2023  PCP:  Nile Harvard, MD   [facility] Providers Cardiologist:  Zachary Schilling, MD        Patient Profile:   Zachary Parrish is a 83 y.o. male with a hx of CAD, DRESS syndrome, PAF, hypertension, DM2, CKD stage IV followed by [facility], carotid artery disease, anemia and EtOH abuse who is being seen 03/29/2023 for the evaluation of atrial fibrillation with RVR and CHF at the request of Dr. Donzetta.  History of Present Illness:   Zachary Parrish is a 83 year old male with past medical history of CAD, DRESS syndrome, PAF, hypertension, DM2, CKD stage IV followed by [facility], carotid artery disease, anemia and EtOH abuse. He previously underwent cardiac catheterization in 2014 at [facility], this revealed 60 to 70% calcified left main lesion, high-grade ramus artery disease.  Medical therapy was recommended.  He had a history of dilated cardiomyopathy with improved EF eventually.  He was in the hospital in March 2021 with dress syndrome (drug reaction with eosinophilia and systemic symptoms) as allergic reaction toward allopurinol.  He had acute kidney injury and transaminitis.  Hospitalization complicated by occurrence of atrial fibrillation for which he underwent TEE DCCV on 06/07/2019.  TEE showed EF 55 to 60%, no regional wall motion abnormality, mild to moderate MR, mildly dilated ascending aorta at 37 mm.  He was started on Eliquis  at the time.  However given the solitary nature of A-fib, he eventually came off of Eliquis .  Patient was last seen by Dr. Schilling in March 2023, he was maintaining sinus rhythm at that time.  Last carotid Doppler obtained on 05/31/2022 showed 60 to 79% disease in the right ICA, 1 to 39% left ICA stenosis.  Antegrade flow in bilateral vertebral artery.  Normal flow hemodynamics in bilateral  subclavian arteries.  His anemia has been followed by hematology service.  He periodically receives injection for anemia.  For the past 4 months, he has been noticing worsening shortness of breath with exertion and lower extremity edema.  He denies any chest discomfort with exertion.  He also has a productive cough with white phlegm.  He has been compliant with carvedilol , telmisartan and Lasix  40 mg daily at home.  According to wife, he has been drinking roughly 6 ounces of vodka on a daily basis.  His last drink was several days ago.   Due to worsening shortness of breath, he eventually sought medical attention at [facility], ED on 03/29/2023.  Initial blood work showed creatinine of 2.83, previous creatinine 2.3.  Normal AST and ALT.  Normal potassium and sodium level.  Serial troponin 23--> 23.  White blood cell count 14.0.  Hemoglobin 10.9.  Chest x-ray showed a large right pleural effusion and a tiny left pleural effusion with cardiomegaly.  EKG shows atrial fibrillation with RVR of unknown duration, heart rate 120s.  CT of chest showed a large right pleural effusion with pleural thickening, associated opacity in the right lower lobe, splenomegaly, 2.4 cm cystic lesion in the right thyroid  lobe, enlarged heart with extensive vascular calcification.  He is pending right upper quadrant abdominal ultrasound.  Right Pleurx catheter was placed in the ED.  So far 1 L was drained out from the right pleural space.  Repeat chest x-ray showed mild improvement in the right hemithorax opacification, possible small pneumothorax in the right upper lobe.  Patient was  given 80 mg IV Lasix , 5 mg IV Lopressor  and 25 mg metoprolol  tartrate for rate control.  Cardiology service consulted for A-fib with RVR and volume overload.   Past Medical History:  Diagnosis Date   CAD (coronary artery disease) 2014   60-70% calcified LM lesion, high grade OM lesion, treated medically.   DM (diabetes mellitus) (HCC)    no longer on  medications   Gout    HTN (hypertension)    PAF (paroxysmal atrial fibrillation) (HCC) 06/03/2019   during admission for DRESS    Past Surgical History:  Procedure Laterality Date   CARDIAC CATHETERIZATION  2014   CARDIOVERSION N/A 06/07/2019   Procedure: CARDIOVERSION;  Surgeon: Kate Lonni CROME, MD;  Location: [facility] ENDOSCOPY;  Service: Cardiovascular;  Laterality: N/A;   TEE WITHOUT CARDIOVERSION N/A 06/07/2019   Procedure: TRANSESOPHAGEAL ECHOCARDIOGRAM (TEE);  Surgeon: Kate Lonni CROME, MD;  Location: [facility] ENDOSCOPY;  Service: Cardiovascular;  Laterality: N/A;     Home Medications:  Prior to Admission medications   Medication Sig Start Date End Date Taking? Authorizing Provider  aspirin  81 MG chewable tablet Chew 162 mg by mouth daily.   Yes [provider]  atorvastatin  (LIPITOR) 40 MG tablet Take 40 mg by mouth daily. 03/27/19  Yes [provider]  carvedilol  (COREG ) 25 MG tablet Take 1 tablet (25 mg total) by mouth 2 (two) times daily. Hold dose if heart rate is less than 50 06/07/19  Yes Kyle, Tyrone A, DO  Coenzyme Q10 300 MG CAPS Take 1 capsule by mouth daily.   Yes [provider]  Cyanocobalamin  (B-12 PO) Take by mouth.   Yes [provider]  furosemide  (LASIX ) 40 MG tablet Take 40 mg by mouth daily.    Yes [provider]  Multiple Vitamins-Minerals (OCUVITE PO) Take 1 capsule by mouth at bedtime.   Yes [provider]  Omega-3 Fatty Acids (FISH OIL) 1000 MG CAPS Take 2,000 capsules by mouth 2 (two) times daily.   Yes [provider]  potassium chloride (MICRO-K) 10 MEQ CR capsule Take 20 mEq by mouth 2 (two) times daily.   Yes [provider]  telmisartan (MICARDIS) 40 MG tablet Take 40 mg by mouth daily.   Yes [provider]    Inpatient Medications: Scheduled Meds:  aspirin   81 mg Oral Daily   atorvastatin   40 mg Oral Daily   folic acid   1 mg Oral Daily   multivitamin with  minerals  1 tablet Oral Daily   thiamine   100 mg Oral Daily   Continuous Infusions:  heparin  1,400 Units/hr (03/29/23 1634)   PRN Meds: acetaminophen  **OR** acetaminophen   Allergies:    Allergies  Allergen Reactions   Allopurinol     Possible DRESS syndrome   Colchicine     Possible DRESS syndrome    Social History:   Social History   Socioeconomic History   Marital status: Married    Spouse name: Not on file   Number of children: Not on file   Years of education: Not on file   Highest education level: Not on file  Occupational History   Not on file  Tobacco Use   Smoking status: Former   Smokeless tobacco: Current   Tobacco comments:    quit smoking 50 yrs ago, now chew tobacco  Vaping Use   Vaping status: Never Used  Substance and Sexual Activity   Alcohol  use: Yes    Comment: socially   Drug use: Never  Sexual activity: Not on file  Other Topics Concern   Not on file  Social History Narrative   Not on file   Social Drivers of Health   Financial Resource Strain: Low Risk  (01/24/2023)   Received from [facility]   Overall Financial Resource Strain (CARDIA)    Difficulty of Paying Living Expenses: Not hard at all  Food Insecurity: No Food Insecurity (01/24/2023)   Received from [facility]   Hunger Vital Sign    Worried About Running Out of Food in the Last Year: Never true    Ran Out of Food in the Last Year: Never true  Transportation Needs: No Transportation Needs (01/24/2023)   Received [facility] - Transportation    Lack of Transportation (Medical): No    Lack of Transportation (Non-Medical): No  Physical Activity: Insufficiently Active (01/24/2023)   Received from [facility]   Exercise Vital Sign    Days of Exercise per Week: 2 days    Minutes of Exercise per Session: 20 min  Stress: No Stress Concern Present (01/24/2023)   Received from [facility] - Occupational Stress  Questionnaire    Feeling of Stress : Not at all  Social Connections: Moderately Integrated (01/24/2023)   Received from [facility]   Social Network    How would you rate your social network (family, work, friends)?: Adequate participation with social networks  Intimate Partner Violence: Not At Risk (01/24/2023)   Received from [facility]   HITS    Over the last 12 months how often did your partner physically hurt you?: Never    Over the last 12 months how often did your partner insult you or talk down to you?: Never    Over the last 12 months how often did your partner threaten you with physical harm?: Never    Over the last 12 months how often did your partner scream or curse at you?: Never    Family History:    Family History  Problem Relation Age of Onset   Osteoporosis Mother        passed away at age 37   Heart attack Father        died at age 37 from massive MI     ROS:  Please see the history of present illness. All other ROS reviewed and negative.     Physical Exam/Data:   Vitals:   03/29/23 1500 03/29/23 1511 03/29/23 1600 03/29/23 1627  BP: (!) 64/46 (!) 104/58 (!) 89/70 (!) 104/58  Pulse: 100 95 (!) 43 95  Resp: (!) 30  (!) 24   Temp:      TempSrc:      SpO2: 100% 95% 91%   Weight:      Height:        Intake/Output Summary (Last 24 hours) at 03/29/2023 1736 Last data filed at 03/29/2023 1508 Gross per 24 hour  Intake 350 ml  Output --  Net 350 ml      03/29/2023   12:00 PM 06/01/2021   11:19 AM 11/23/2019    7:53 AM  Last 3 Weights  Weight (lbs) 210 lb 196 lb 3.2 oz 189 lb  Weight (kg) 95.255 kg 88.996 kg 85.73 kg     Body mass index is 27.71 kg/m.  General:  Well nourished, well developed, in no acute distress HEENT: normal Neck: no JVD Vascular: No carotid bruits; Distal pulses 2+ bilaterally Cardiac:  normal  S1, S2; RRR; no murmur  Lungs: Diminished breath sound in the right base Abd: soft, nontender, no hepatomegaly  Ext: 2+ pitting  edema Musculoskeletal:  No deformities, BUE and BLE strength normal and equal Skin: warm and dry  Neuro:  CNs 2-12 intact, no focal abnormalities noted Psych:  Normal affect   EKG:  The EKG was personally reviewed and demonstrates: Atrial fibrillation with RVR Telemetry:  Telemetry was personally reviewed and demonstrates: Atrial fibrillation, heart rate around 100-110s  Relevant CV Studies:  TEE 06/07/2019  1. Left ventricular ejection fraction, by estimation, is 55 to 60%. The  left ventricle has normal function. The left ventricle has no regional  wall motion abnormalities. There is moderate left ventricular hypertrophy.   2. Right ventricular systolic function is normal. The right ventricular  size is normal.   3. The mitral valve is normal in structure. Mild to moderate mitral valve  regurgitation.   4. The aortic valve is tricuspid. Aortic valve regurgitation is not  visualized.   5. Aortic dilatation noted. There is mild dilatation of the ascending  aorta measuring 37 mm.   6. Resistance was felt when advancing probe into stomach, so probe was  withdrawn and no transgastric views were obtained   7. Left atrial size was mildly dilated. No left atrial/left atrial  appendage thrombus was detected.   Laboratory Data:  High Sensitivity Troponin:   Recent Labs  Lab 03/29/23 0903 03/29/23 1130  TROPONINIHS 23* 23*     Chemistry Recent Labs  Lab 03/29/23 0903 03/29/23 0928 03/29/23 1017  NA 136  --  136  K 4.8  --  4.7  CL 97*  --  97*  CO2 26  --   --   GLUCOSE 155*  --  156*  BUN 48*  --  48*  CREATININE 2.83*  --  2.80*  CALCIUM  8.8*  --   --   MG  --  2.1  --   GFRNONAA 22*  --   --   ANIONGAP 13  --   --     Recent Labs  Lab 03/29/23 0903  PROT 6.8  ALBUMIN  2.8*  AST 28  ALT 24  ALKPHOS 84  BILITOT 1.9*   Lipids No results for input(s): CHOL, TRIG, HDL, LABVLDL, LDLCALC, CHOLHDL in the last 168 hours.  Hematology Recent Labs  Lab  03/29/23 0903 03/29/23 1017  WBC 14.0*  --   RBC 3.32*  --   HGB 10.9* 11.6*  HCT 33.3* 34.0*  MCV 100.3*  --   MCH 32.8  --   MCHC 32.7  --   RDW 17.1*  --   PLT 147*  --    Thyroid  No results for input(s): TSH, FREET4 in the last 168 hours.  BNPNo results for input(s): BNP, PROBNP in the last 168 hours.  DDimer No results for input(s): DDIMER in the last 168 hours.   Radiology/Studies:  DG Chest Port 1 View Result Date: 03/29/2023 CLINICAL DATA:  [id number] S/P thoracostomy tube placement (HCC) [id number]. EXAM: PORTABLE CHEST 1 VIEW COMPARISON:  03/29/2023, 9:33 a.m. FINDINGS: Redemonstration of mild opacification of right hemithorax, mildly improved since the prior study. Findings may represent underlying pleural effusion and atelectasis with or without consolidation. There is new right-sided pleural drainage catheter. There is apparent lucency overlying the right upper lung zone, concerning for a small pneumothorax. Attention on follow-up examination is recommended. Left lung is grossly clear. Stable cardio-mediastinal silhouette. No acute osseous abnormalities. The soft tissues  are within normal limits. IMPRESSION: *Mild interval improvement in the right hemithorax opacification, status post right pleural drainage catheter placement. There is apparent lucency overlying the right upper lung zone, concerning for a small pneumothorax. Attention on follow-up examination is recommended. Electronically Signed   By: Ree Molt M.D.   On: 03/29/2023 16:48   CT CHEST WO CONTRAST Result Date: 03/29/2023 CLINICAL DATA:  Respiratory illness. EXAM: CT CHEST WITHOUT CONTRAST TECHNIQUE: Multidetector CT imaging of the chest was performed following the standard protocol without IV contrast. RADIATION DOSE REDUCTION: This exam was performed according to the departmental dose-optimization program which includes automated exposure control, adjustment of the mA and/or kV according to patient size  and/or use of iterative reconstruction technique. COMPARISON:  X-ray earlier 03/29/2023. FINDINGS: Cardiovascular: Trace pericardial fluid or thickening. Heart slightly enlarged. Coronary artery calcifications are seen. Prominent epicardial fat. The thoracic aorta has a overall normal course and caliber with scattered diffuse calcified plaque. There is a bovine type aortic arch, normal variant. Significant stenosis suggested at the origin of the right subclavian artery and right common carotid artery additional evaluation with CTA can be performed when clinically appropriate. There is also significant plaque along the left common carotid artery origin. Mediastinum/Nodes: Slightly patulous thoracic esophagus with some luminal gas. Small thyroid  gland with a cystic nodule involving the right thyroid  lobe measuring 2.4 by 1.4 cm. Recommend further workup with thyroid  ultrasound when appropriate if there is no known history. On this non IV contrast exam there is no specific abnormal lymph node enlargement present in the axillary regions hilum. Several small less than 1 cm in size in short axis nodes are seen in the mediastinum, nonpathologic by size criteria. There is some edema along the mediastinum inferiorly on the right side. Prominent supraclavicular node on the left on series 3, image 9 has short axis of 10 mm. Lungs/Pleura: There is a calcified left lower lobe lung nodule series 3, image 73 consistent with old granulomatous disease. Tiny left pleural effusion. No left-sided consolidation, pneumothorax otherwise. Right lung has a large pleural effusion with pleural thickening. The inferior aspect is incompletely included in the imaging field. Opacity seen in the visualized portions of the right lung with a rounded areas of opacity along the right lower lobe. Although this could be an infiltrate trait this also could be an area of rounded atelectasis. Upper Abdomen: Limited visualization of the upper abdomen. The  adrenal glands are incompletely included in the imaging field. Spleen is enlarged measuring at least 15.5 cm where visible. Musculoskeletal: Mild degenerative changes along the spine. Old left chronic appearing rib deformities. IMPRESSION: Large right pleural effusion with pleural thickening. Associated opacity including rounded areas of opacity in the right lower lobe. Although this could be an infiltrate or lesion this could be an area of rounded atelectasis. Please correlate for any known history or prior. Very small left pleural effusion. Splenomegaly. Incompletely included in the imaging field. Further workup when clinically appropriate. 2.4 cm cystic lesion in the right thyroid  lobe. Please correlate with any prior dedicated thyroid  ultrasound when appropriate. Enlarged heart with the extensive vascular calcifications including potential significant stenosis along the great vessels including brachiocephalic, common carotid and right subclavian arteries. Further workup with CTA when clinically appropriate. Nonspecific mediastinal stranding with some small nodes including 1 slightly abnormally enlarged supraclavicular node on the left side. Aortic Atherosclerosis (ICD10-I70.0). Electronically Signed   By: Ranell Bring M.D.   On: 03/29/2023 11:42   DG Chest 2 View Result Date:  03/29/2023 CLINICAL DATA:  Shortness of breath for 3 weeks EXAM: CHEST - 2 VIEW COMPARISON:  05/31/2019 FINDINGS: Large right pleural effusion. Small left. Underinflation with enlarged heart. Calcified aorta. No pneumothorax or edema. Film is under penetrated. IMPRESSION: Large right pleural effusion and tiny left. Underinflation. Enlarged cardiopericardial silhouette. Please correlate with history or further workup when appropriate. Electronically Signed   By: Ranell Bring M.D.   On: 03/29/2023 10:05     Assessment and Plan:   Atrial fibrillation with RVR: Unknown duration.  Previously solitary episode of A-fib during  hospitalization in March 2021 in the setting of DRESS syndrome.  He was on Eliquis  in 2021, however later came off of Eliquis  due to solitary episode.  IV heparin  for now given chest tube placement.  Will need to transition to NOAC at some point.  Pending echocardiogram, if EF low, will need to consider TEE DCCV.  Unable to add rate control medication given hypotension, will discuss with MD regarding possibility of initiating IV amiodarone  if rate control becomes a problem. Patient may need to be admitted to ICU  Acute heart failure: Unclear if systolic or diastolic at this point.  Pending echocardiogram.  If EF is low, low threshold of considering TEE DCCV in the next few days.  Received 80 mg IV Lasix  in the ED.  BP low in the 80s, will hold off on additional IV diuresis until blood pressure  Hypotension: Received antibiotics in the ED to help cover for pneumonia.  Given 80 mg IV Lasix , 5 mg Lopressor  and 25 mg metoprolol  tartrate.  Unable to add any more rate control medication.  Consider addition of IV amiodarone .  If blood pressure persistently low, may need pressors overnight, however pressures likely will exacerbate A-fib.  EtOH abuse: He drinks 6 ounces of vodka on a daily basis.  On CIWA protocol as he has not drank in the past few days.  I discussed with the patient correlation between alcohol  abuse and atrial fibrillation  Large right pleural effusion: Pleurx catheter placed in the emergency room.  So far 1 L has been drawn from the right pleural space.  Patient says his breathing has improved. Follow up CXR shows questionable tiny pneumothorax  Splenomegaly: Pending right upper quadrant ultrasound  CAD: Previous cardiac catheterization at [facility] in 2014 showed 60 to 70% calcified left main lesion, managed medically.  Patient denies any recent chest pain.  Hypertension: See #3, currently hypotensive.  Hold home telmisartan and carvedilol   DM2: Diet controlled  CKD stage IV: Followed by  [facility] as outpatient.  Will need to monitor renal function closely   Risk Assessment/Risk Scores:     New York  Heart Association (NYHA) Functional Class NYHA Class III  CHA2DS2-VASc Score = 6   This indicates a 9.7% annual risk of stroke. The patient's score is based upon: CHF History: 1 HTN History: 1 Diabetes History: 1 Stroke History: 0 Vascular Disease History: 1 Age Score: 2 Gender Score: 0   For questions or updates, please contact [facility] Please consult [email or web address] for contact info under    Signed, Scot Ford, [place]  03/29/2023 5:36 PM  History and all data above reviewed.  Patient examined.  I agree with the findings as above.  The patient presents with progressive shortness of breath.  He says that he has been hydrating as instructed for the last several weeks few months and has had progressive lower extremity swelling.  It sounds like he has had some weakness  in failure to thrive getting around his house very slowly.  He is chronically slept with his head elevated.  He has had chest congestion.  For which she uses Vicks vapor rub and cough drops.  He has not had any fevers or chills.  He has not noticed any palpitations, presyncope or syncope.  He saw his primary provider in November and there was not any mention of any tacky arrhythmias.  He has not felt any palpitations, presyncope or syncope.  He has a cough but nonproductive or productive of some plain sputum.  He finally presented because of the increasing shortness of breath and leg edema.  He is found to be in atrial fibrillation have a large pleural effusion the patient exam reveals COR: Very distant heart sounds, irregular,  Lungs: Decreased breath sounds on the right 1 halfway up,  Abd: Positive bowel sounds normal frequency in pitch, Ext moderate edema.  All available labs, radiology testing, previous records reviewed. Agree with documented assessment and plan.  Atrial fibrillation: Rate  will be somewhat difficult to control.  Drops blood pressure with beta-blockers.  He will be started on heparin .  Ultimately I think he will need probably a TEE cardioversion this admission.  Pleural effusion: He is now status post chest tube.  Diuresis will need to be gentle Lasix  given his chronic renal insufficiency.  We will check an echocardiogram I fully expect his ejection fraction to be reduced.  Chronic renal sufficiency he has had this since his DRESS syndrome and his renal function has been relatively stable.  Follow closely.   Zachary Parrish  6:15 PM  03/29/2023

## 2023-03-29 NOTE — ED Notes (Signed)
 2nd hospitalist is again at the bedside as vital signs are obtained. MD aware of pt BP 64/46. RN will await orders and continue to monitor.

## 2023-03-29 NOTE — Assessment & Plan Note (Signed)
 Creatinine 2.8, EGFR 22. - UA - Caution with nephrotoxic agents

## 2023-03-29 NOTE — H&P (Addendum)
 Hospital Admission History and Physical Service Pager: [phone]  Patient name: Zachary Parrish Medical record number: [id number] Date of Birth: [birth year] Age: 83 y.o. Gender: male  Primary Care Provider: Radiontchenko, Alexei, MD Consultants: None Code Status: Full code Preferred Emergency Contact:  Contact Information     Name Relation Home Work Mobile   Zachary, Parrish Spouse   [phone]      Other Contacts   None on File    Chief Complaint: SOB   Assessment and Plan: Zachary Parrish is a 83 y.o. male presenting with PMHx A Fib, CAD, T2DM, HTN, CKD, and anemia. Differential for presentation of this includes: Acute hypoxic respiratory failure -differential below Large right pleural effusion -differential below Pneumonia -leukocytosis, O2 demand, increased cough.  However no fever. Cirrhosis - hx of alcohol  abuse (8 oz vodka daily), low albumin , pending RUQ U/S, Acute congestive heart failure- last echo in 2021 demonstrated LVEF 55-60% and moderate LVH, mild-moderate MVR.  Orthopnea, bilateral pitting edema. PE - Wells score 1.5.  However tachycardic explained by A-fib. If symptoms continue to worsen after initiating Lasix  and supplemental O2, could consider CTPE Malignancy -prior smoking history, current chewing tobacco use.  Unilateral pleural effusion with CT findings concerning for malignancy. Nephrotic syndrome -increased creatinine, low albumin , peripheral edema. A-Fib with RVR - history of PAF not on anticoagulation. Assessment & Plan Acute hypoxic respiratory failure (HCC) Increased work of breathing, on 2L LFNC saturating at 100%.  S/p IV Lasix  80 mg.  Difficulty speaking full sentences. - Admit to FMTS, attending Dr. Donzetta - Pulmonology consulted, appreciate recommendations - PT/OT to treat - Echocardiogram - Supplemental O2 - Consider further diuresis - Consulted pulmonology Pleural effusion CT chest demonstrated large R pleural effusion with pleural thickening  and very small left pleural effusion. Extensive differential discussed above.  S/p IV Lasix  80 mg.  - Pulmonology consulted, appreciate recommendations - Consider thoracentesis - Continue ceftriaxone  and azithromycin  (1/14- ) - Daily weights - Strict I/Os Atrial fibrillation with RVR (HCC) Rate controlled, however hypotensive.  Hold metoprolol , may need Amio. - Cardiology consulted, appreciate recommendations - Heparin  gtt - Cardiac monitoring Hypotension BP 70-120s/50-90s. Reports being normotensive at home with his medications - Coreg  25 mg BID and Telmisartan 20 mg daily for HTN. - Cardiology consulted, appreciate recs - Hold home meds - Hold diuresis given hypotension Thyroid  lesion TSH 2.346 3 years ago. CT chest w/o contrast revealed 2.4 cm cystic lesion in the right thyroid  lobe.  - TSH Anemia of chronic disease Likely in the setting of CKD. Receives ESA if Hgb <10.  - Monitor with AM CBC w/ diff Alcohol  abuse Hx of alcohol  use daily with vodka.  - CIWAs without atiivan - PT/INR - RUQ US  to assess for cirrhosis - Folate - MV - Thiamine  Urine retention Questionable urinary retention. Reports increased fluid intake, but decreased output. - Bladder scan CKD (chronic kidney disease) Creatinine 2.8, EGFR 22. - UA - Caution with nephrotoxic agents  Chronic and Stable Problems:  CKD - Cr seems near baseline for him. Follows with nephrology outpatient.  HLD - Continue Lipitor 40 mg tablet Low back pain - likely due to sedentary lifestyle and age, no acute findings on exam  FEN/GI: Heart Healthy VTE Prophylaxis: Heparin   Disposition: Med-Tele  History of Present Illness:  Zachary Parrish is a 83 y.o. male presenting with positional SOB worsened with exertion over the past 2-3 month with hypoxia, bilateral lower back pain worsened with standing and ambulation, and fatigue for several  months with no acute worsening. He also endorses decreased urine output lately despite  drinking lots of fluids and poor appetite.  Endorses leg swelling, not his baseline and decreased appetite. Increased cough, but no fevers, night sweats, weight loss, NVD, dysphaiga, orthopnea.  Follows with Nephrology OP for his CKD.   In the ED, patient had soft Bps in the 90-100s/50-90s. Troponins were 23 and trended flat. CBC demonstrated elevated WBC to 14, Hgb stable at 10.9, and MCV 100.3. BMP with stable electrolytes and Cr seems to be at baseline. HFP revealed elevated bilirubin to 1.9. CXR demonstrated large right pleural effusion and enlarged cardiac silhouette. CT scan re-demonstrated large R sided pleural effusion, RLL opacity/possible infiltrate. EDP started heparin  for atrial fibrillation, metoprolol  for rate control, Lasix  80 mg for diuresis, azithromycin  and ceftriaxone  for empiric treatment for CAP.   Review Of Systems: as above  Pertinent Past Medical History: CAD T2DM HTN PAF CKD Remainder reviewed in history tab.   Pertinent Past Surgical History: Cardiac cath Cardioversion TEE without cardioversion  Remainder reviewed in history tab.   Pertinent Social History: Tobacco use: Former smoker, current chews tobacco Alcohol  use: Daily drinking, vodka 6-8oz Other Substance use: None Lives with spouse  Pertinent Family History: Mother: Osteoporosis Father: Heart attack Remainder reviewed in history tab.   Important Outpatient Medications: ASA 81 mg daily Lipitor 40 mg daily Coreg  25 mg BID, hold dose if HR <50 Coenzyme Q10 daily B-12 daily Lasix  40 mg daily Klor-Con 20mEq daily Ocuvite daily Fish Oil 1000 mg - 2 capsules daily Telmisartan 20 mg daily Remainder reviewed in medication history.   Objective: BP (!) 105/93   Pulse (!) 113   Temp 97.7 F (36.5 C) (Oral)   Resp (!) 28   SpO2 100%  Exam: General: Chronically ill-appearing male with dyspnea HEENT: NCAT. Sclera anicteric. No rhinorrhea.  Cardiovascular: Irregularly irregular. No  M/R/G Respiratory: Has difficulty speaking d/t SOB. On 2L O2. Crackles noted R>L. Diminished breath sounds over R>L. No wheezing noted.  Gastrointestinal: Soft, non-tender, non-distended. Normoactive bowel sounds. MSK: 2+ BLE edema. No TTP over lower back and no step off.  Derm: Warm and dry.  Neuro: No focal neurological deficits. Psych: Normal mood and affect.  Labs:  CBC BMET  Recent Labs  Lab 03/29/23 0903 03/29/23 1017  WBC 14.0*  --   HGB 10.9* 11.6*  HCT 33.3* 34.0*  PLT 147*  --    Recent Labs  Lab 03/29/23 0903 03/29/23 1017  NA 136 136  K 4.8 4.7  CL 97* 97*  CO2 26  --   BUN 48* 48*  CREATININE 2.83* 2.80*  GLUCOSE 155* 156*  CALCIUM  8.8*  --      Troponin: 23>23 Bilirubin: 1.9 Mag: 2.1  EKG: A fib with RVR - rate 122.  Imaging Studies Performed:  CXR: Large right pleural effusion and tiny left. Underinflation. Enlarged cardiopericardial silhouette.   CT Chest w/o contrast: Large right pleural effusion with pleural thickening. Associated opacity including rounded areas of opacity in the right lower lobe. Although this could be an infiltrate or lesion this could be an area of rounded atelectasis. Please correlate for any known history or prior.   Very small left pleural effusion.   Splenomegaly. Incompletely included in the imaging field. Further workup when clinically appropriate.   2.4 cm cystic lesion in the right thyroid  lobe. Please correlate with any prior dedicated thyroid  ultrasound when appropriate.   Enlarged heart with the extensive vascular calcifications including potential significant  stenosis along the great vessels including brachiocephalic, common carotid and right subclavian arteries. Further workup with CTA when clinically appropriate.   Nonspecific mediastinal stranding with some small nodes including 1 slightly abnormally enlarged supraclavicular node on the left side.  Janna Ferrier, DO 03/29/2023, 12:32 PM PGY-1, [facility]  Family Medicine  FPTS Intern pager: [phone], text pages welcome Secure chat group [facility] Teaching Service

## 2023-03-29 NOTE — Assessment & Plan Note (Deleted)
 Possible due to CT chest findings of RLL opacity. However, no hx of fever or sputum production, but worsening dyspnea. S/p IV Ceftriaxone and Azithromycin in the ED (1/14 - ) - Continue CTX and Azithromycin

## 2023-03-30 ENCOUNTER — Inpatient Hospital Stay (HOSPITAL_COMMUNITY): Payer: Medicare Other

## 2023-03-30 ENCOUNTER — Encounter (HOSPITAL_COMMUNITY): Payer: Self-pay | Admitting: Family Medicine

## 2023-03-30 DIAGNOSIS — R34 Anuria and oliguria: Secondary | ICD-10-CM

## 2023-03-30 DIAGNOSIS — J9601 Acute respiratory failure with hypoxia: Secondary | ICD-10-CM | POA: Diagnosis not present

## 2023-03-30 DIAGNOSIS — R0609 Other forms of dyspnea: Secondary | ICD-10-CM

## 2023-03-30 DIAGNOSIS — J869 Pyothorax without fistula: Secondary | ICD-10-CM

## 2023-03-30 DIAGNOSIS — Z7409 Other reduced mobility: Secondary | ICD-10-CM | POA: Diagnosis present

## 2023-03-30 DIAGNOSIS — R6521 Severe sepsis with septic shock: Secondary | ICD-10-CM | POA: Diagnosis not present

## 2023-03-30 DIAGNOSIS — N184 Chronic kidney disease, stage 4 (severe): Secondary | ICD-10-CM | POA: Diagnosis not present

## 2023-03-30 DIAGNOSIS — A419 Sepsis, unspecified organism: Secondary | ICD-10-CM | POA: Diagnosis not present

## 2023-03-30 DIAGNOSIS — I4891 Unspecified atrial fibrillation: Secondary | ICD-10-CM | POA: Diagnosis present

## 2023-03-30 DIAGNOSIS — A4901 Methicillin susceptible Staphylococcus aureus infection, unspecified site: Secondary | ICD-10-CM | POA: Diagnosis not present

## 2023-03-30 DIAGNOSIS — I9589 Other hypotension: Secondary | ICD-10-CM | POA: Diagnosis not present

## 2023-03-30 DIAGNOSIS — Z4682 Encounter for fitting and adjustment of non-vascular catheter: Secondary | ICD-10-CM

## 2023-03-30 DIAGNOSIS — R2689 Other abnormalities of gait and mobility: Secondary | ICD-10-CM | POA: Insufficient documentation

## 2023-03-30 DIAGNOSIS — N179 Acute kidney failure, unspecified: Secondary | ICD-10-CM | POA: Diagnosis not present

## 2023-03-30 LAB — PROTEIN / CREATININE RATIO, URINE
Creatinine, Urine: 220 mg/dL
Protein Creatinine Ratio: 1.22 mg/mg{creat} — ABNORMAL HIGH (ref 0.00–0.15)
Total Protein, Urine: 268 mg/dL

## 2023-03-30 LAB — ECHOCARDIOGRAM COMPLETE
AR max vel: 1.92 cm2
AV Area VTI: 2.03 cm2
AV Area mean vel: 1.87 cm2
AV Mean grad: 2 mm[Hg]
AV Peak grad: 3 mm[Hg]
Ao pk vel: 0.87 m/s
Area-P 1/2: 5.73 cm2
Calc EF: 71.4 %
Height: 73 in
S' Lateral: 4.1 cm
Single Plane A2C EF: 70.8 %
Single Plane A4C EF: 72 %
Weight: 3333.36 [oz_av]

## 2023-03-30 LAB — CBC WITH DIFFERENTIAL/PLATELET
Abs Immature Granulocytes: 0.08 10*3/uL — ABNORMAL HIGH (ref 0.00–0.07)
Basophils Absolute: 0 10*3/uL (ref 0.0–0.1)
Basophils Relative: 0 %
Eosinophils Absolute: 0 10*3/uL (ref 0.0–0.5)
Eosinophils Relative: 0 %
HCT: 32.6 % — ABNORMAL LOW (ref 39.0–52.0)
Hemoglobin: 10.4 g/dL — ABNORMAL LOW (ref 13.0–17.0)
Immature Granulocytes: 1 %
Lymphocytes Relative: 3 %
Lymphs Abs: 0.4 10*3/uL — ABNORMAL LOW (ref 0.7–4.0)
MCH: 32.4 pg (ref 26.0–34.0)
MCHC: 31.9 g/dL (ref 30.0–36.0)
MCV: 101.6 fL — ABNORMAL HIGH (ref 80.0–100.0)
Monocytes Absolute: 1.1 10*3/uL — ABNORMAL HIGH (ref 0.1–1.0)
Monocytes Relative: 10 %
Neutro Abs: 9.7 10*3/uL — ABNORMAL HIGH (ref 1.7–7.7)
Neutrophils Relative %: 86 %
Platelets: 135 10*3/uL — ABNORMAL LOW (ref 150–400)
RBC: 3.21 MIL/uL — ABNORMAL LOW (ref 4.22–5.81)
RDW: 17.2 % — ABNORMAL HIGH (ref 11.5–15.5)
WBC: 11.2 10*3/uL — ABNORMAL HIGH (ref 4.0–10.5)
nRBC: 0 % (ref 0.0–0.2)

## 2023-03-30 LAB — URINALYSIS, ROUTINE W REFLEX MICROSCOPIC
Bilirubin Urine: NEGATIVE
Glucose, UA: NEGATIVE mg/dL
Hgb urine dipstick: NEGATIVE
Ketones, ur: NEGATIVE mg/dL
Leukocytes,Ua: NEGATIVE
Nitrite: NEGATIVE
Protein, ur: >=300 mg/dL — AB
Specific Gravity, Urine: 1.017 (ref 1.005–1.030)
pH: 5 (ref 5.0–8.0)

## 2023-03-30 LAB — COMPREHENSIVE METABOLIC PANEL
ALT: 23 U/L (ref 0–44)
AST: 24 U/L (ref 15–41)
Albumin: 2.7 g/dL — ABNORMAL LOW (ref 3.5–5.0)
Alkaline Phosphatase: 68 U/L (ref 38–126)
Anion gap: 13 (ref 5–15)
BUN: 59 mg/dL — ABNORMAL HIGH (ref 8–23)
CO2: 24 mmol/L (ref 22–32)
Calcium: 8.3 mg/dL — ABNORMAL LOW (ref 8.9–10.3)
Chloride: 97 mmol/L — ABNORMAL LOW (ref 98–111)
Creatinine, Ser: 3.79 mg/dL — ABNORMAL HIGH (ref 0.61–1.24)
GFR, Estimated: 15 mL/min — ABNORMAL LOW (ref >60)
Glucose, Bld: 138 mg/dL — ABNORMAL HIGH (ref 70–99)
Potassium: 4.9 mmol/L (ref 3.5–5.1)
Sodium: 134 mmol/L — ABNORMAL LOW (ref 135–145)
Total Bilirubin: 1.3 mg/dL — ABNORMAL HIGH (ref 0.0–1.2)
Total Protein: 6.5 g/dL (ref 6.5–8.1)

## 2023-03-30 LAB — BRAIN NATRIURETIC PEPTIDE: B Natriuretic Peptide: 346.8 pg/mL — ABNORMAL HIGH (ref 0.0–100.0)

## 2023-03-30 LAB — GLUCOSE, CAPILLARY: Glucose-Capillary: 203 mg/dL — ABNORMAL HIGH (ref 70–99)

## 2023-03-30 LAB — HEPARIN LEVEL (UNFRACTIONATED)
Heparin Unfractionated: 0.34 [IU]/mL (ref 0.30–0.70)
Heparin Unfractionated: 0.33 [IU]/mL (ref 0.30–0.70)

## 2023-03-30 LAB — MAGNESIUM: Magnesium: 2.2 mg/dL (ref 1.7–2.4)

## 2023-03-30 LAB — MRSA NEXT GEN BY PCR, NASAL: MRSA by PCR Next Gen: NOT DETECTED

## 2023-03-30 MED ORDER — NOREPINEPHRINE 4 MG/250ML-% IV SOLN
2.0000 ug/min | INTRAVENOUS | Status: DC
  Administered 2023-03-30: 2 ug/min via INTRAVENOUS
  Filled 2023-03-30: qty 250

## 2023-03-30 MED ORDER — PERFLUTREN LIPID MICROSPHERE
1.0000 mL | INTRAVENOUS | Status: AC | PRN
  Administered 2023-03-30: 3 mL via INTRAVENOUS

## 2023-03-30 MED ORDER — LINEZOLID 600 MG/300ML IV SOLN
600.0000 mg | Freq: Two times a day (BID) | INTRAVENOUS | Status: DC
  Administered 2023-03-30 – 2023-03-31 (×3): 600 mg via INTRAVENOUS
  Filled 2023-03-30 (×4): qty 300

## 2023-03-30 MED ORDER — FUROSEMIDE 10 MG/ML IJ SOLN
80.0000 mg | Freq: Once | INTRAMUSCULAR | Status: AC
  Administered 2023-03-30: 80 mg via INTRAVENOUS
  Filled 2023-03-30: qty 8

## 2023-03-30 MED ORDER — FUROSEMIDE 10 MG/ML IJ SOLN: 80.0000 mg | Freq: Two times a day (BID) | INTRAMUSCULAR | Status: DC

## 2023-03-30 MED ORDER — NOREPINEPHRINE 16 MG/250ML-% IV SOLN
0.0000 ug/min | INTRAVENOUS | Status: DC
  Administered 2023-03-30: 7 ug/min via INTRAVENOUS
  Administered 2023-03-31: 10 ug/min via INTRAVENOUS
  Administered 2023-04-02: 8 ug/min via INTRAVENOUS
  Administered 2023-04-03: 10 ug/min via INTRAVENOUS
  Administered 2023-04-04: 7 ug/min via INTRAVENOUS
  Filled 2023-03-30 (×5): qty 250

## 2023-03-30 MED ORDER — SODIUM CHLORIDE (PF) 0.9 % IJ SOLN
10.0000 mg | Freq: Once | INTRAMUSCULAR | Status: AC
  Administered 2023-03-30: 10 mg via INTRAPLEURAL
  Filled 2023-03-30: qty 10

## 2023-03-30 MED ORDER — PRISMASOL BGK 4/2.5 32-4-2.5 MEQ/L REPLACEMENT SOLN: Status: DC

## 2023-03-30 MED ORDER — SODIUM CHLORIDE 0.9 % IV SOLN
2.0000 g | INTRAVENOUS | Status: DC
  Administered 2023-03-30: 2 g via INTRAVENOUS
  Filled 2023-03-30: qty 20

## 2023-03-30 MED ORDER — CHLORHEXIDINE GLUCONATE CLOTH 2 % EX PADS
6.0000 | MEDICATED_PAD | Freq: Every day | CUTANEOUS | Status: DC
  Administered 2023-03-30 – 2023-04-14 (×16): 6 via TOPICAL

## 2023-03-30 MED ORDER — SODIUM CHLORIDE 0.9 % IV SOLN
250.0000 mL | INTRAVENOUS | Status: AC
  Administered 2023-03-30: 250 mL via INTRAVENOUS

## 2023-03-30 MED ORDER — PRISMASOL BGK 4/2.5 32-4-2.5 MEQ/L EC SOLN: Status: DC

## 2023-03-30 MED ORDER — STERILE WATER FOR INJECTION IJ SOLN
5.0000 mg | Freq: Once | RESPIRATORY_TRACT | Status: AC
  Administered 2023-03-30: 5 mg via INTRAPLEURAL
  Filled 2023-03-30: qty 5

## 2023-03-30 MED ORDER — SODIUM CHLORIDE 0.9 % IV SOLN
500.0000 [IU]/h | INTRAVENOUS | Status: DC
  Administered 2023-03-30 – 2023-04-02 (×5): 500 [IU]/h via INTRAVENOUS_CENTRAL
  Filled 2023-03-30 (×2): qty 2
  Filled 2023-03-30: qty 10000
  Filled 2023-03-30: qty 2
  Filled 2023-03-30: qty 10000

## 2023-03-30 MED ORDER — HEPARIN SODIUM (PORCINE) 1000 UNIT/ML DIALYSIS
1000.0000 [IU] | INTRAMUSCULAR | Status: DC | PRN
  Administered 2023-03-31: 2400 [IU] via INTRAVENOUS_CENTRAL
  Administered 2023-04-03: 3000 [IU] via INTRAVENOUS_CENTRAL
  Filled 2023-03-30 (×2): qty 6
  Filled 2023-03-30: qty 3
  Filled 2023-03-30: qty 6
  Filled 2023-03-30: qty 5
  Filled 2023-03-30: qty 1

## 2023-03-30 NOTE — Procedures (Signed)
 Pleural Fibrinolytic Administration Procedure Note  Zachary Parrish  [id number]  May 06, 1940  Date:03/30/23  Time:2:21 PM   Provider Performing:Zachary Parrish   Procedure: Pleural Fibrinolysis Initial day [phone])  Indication(s) Fibrinolysis of complicated pleural effusion  Consent Risks of the procedure as well as the alternatives and risks of each were explained to the patient and/or caregiver.  Consent for the procedure was obtained. Spoke at length to MR Zachary Parrish and his wife. They are aware of bleeding risk and consent to procedure. Heparin  stopped.   Anesthesia None   Time Out Verified patient identification, verified procedure, site/side was marked, verified correct patient position, special equipment/implants available, medications/allergies/relevant history reviewed, required imaging and test results available.   Sterile Technique Hand hygiene, gloves   Procedure Description Existing pleural catheter was cleaned and accessed in sterile manner.  10mg  of tPA in 30cc of saline and 5mg  of dornase in 30cc of sterile water  were injected into pleural space using existing pleural catheter.  Catheter will be clamped for 1 hour and then placed back to suction.   Complications/Tolerance None; patient tolerated the procedure well.  EBL None   Specimen(s) None

## 2023-03-30 NOTE — Progress Notes (Signed)
 Heparin  drip stopped earlier today to facilitate pleural lytic therapy as concomitant administration of anticoagulation with pleural lytics does increase risk of pleural bleeding.  He had just been started on IV heparin  for his atrial fibrillation. Plan Recommend holding anticoagulation until pleural procedures are done, we will reevaluate chest x-ray and pleural drainage he may require subsequent dosing over the next couple days

## 2023-03-30 NOTE — Assessment & Plan Note (Addendum)
 CT chest demonstrated large R pleural effusion with pleural thickening and very small left pleural effusion. S/p IV Lasix 80 mg and chest tube placement for drainage. ~1500 cc of dark red fluid drained from R chest tube overnight. Fluid culture grew S. aureus empyema, unclear if this began as PNA but he has hx of staph bacteremia so concern for possible recurrent bacteremia.  - Repeat CT scan when pleural effusion has been adequately evacuated (high risk for trapped lung w/ radiographic appearance).  - Due to chronicity of presentation this is suspected to be an exudative process, may require lytics - s/p ceftriaxone and azithromycin (1/14) and switched to CTX and Linezolid (1/15-), de-escalate based on sensitivities - Daily weights - Strict I/Os - Pulmonology consulted, appreciate recommendations

## 2023-03-30 NOTE — Assessment & Plan Note (Addendum)
 Likely in the setting of CKD. Receives ESA if Hgb <10.  - Monitor with AM CBC w/ diff

## 2023-03-30 NOTE — Evaluation (Signed)
 Occupational Therapy Evaluation Patient Details Name: Zachary Parrish MRN: [id number] DOB: October 12, 1940 Today's Date: 03/30/2023   History of Present Illness Zachary Parrish is an 83 year old gentleman admitted 1/14 presenting with greater than 2 months of progressive shortness of breath and leg edema.  Over the last 2 weeks he has felt worse such that he is short of breath even at rest.  He has been sleeping sitting upright sometimes in his car to prop him up enough. Pt also with afib.  Pt with chest tube placed 1/14 due to pleural effusion. PMH:  A-fib many years ago, status post ablation and not recurrent so not on anticoagulation, CKD 3B   Clinical Impression   At baseline, pt is Independent to Mod I with ADLs and functional mobility short household distances without an AD and drives. At baseline, pt receives assistance from wife for all other IADLs. Pt now presents with decreased activity tolerance, pain affecting functional level, impaired cardiopulmonary status, decreased activity tolerance, decreased balance, and decreased safety and independence with functional tasks. Pt currently demonstrates ability to complete UB ADLs Independent to Mod assist, LB ADLs with Mod to Max assist +2, and functional mobility/transfers with a RW with Min assist +2 for physical assist and +3 for chair follow. Pt with episode of orthostatic hypotension during session (see details below) and with O2 sat at 100% on 2L continuous O2 through nasal cannula at rest and requiring 4L continuous O2 through nasal cannula to maintain O2 sat >/86% with functional mobility/tasks in standing. Pt participated well in session and is motivated to return to PLOF. Pt will benefit from acute skilled OT services to address deficits outlined below and to increase safety and independence with functional tasks. Post acute discharge, pt will benefit from continued skilled OT services in the home to maximize rehab potential.       If plan is discharge  home, recommend the following: Two people to help with walking and/or transfers;A lot of help with bathing/dressing/bathroom;Assist for transportation;Help with stairs or ramp for entrance    Functional Status Assessment  Patient has had a recent decline in their functional status and demonstrates the ability to make significant improvements in function in a reasonable and predictable amount of time.  Equipment Recommendations  BSC/3in1;Tub/shower seat    Recommendations for Other Services       Precautions / Restrictions Precautions Precautions: Fall;Other (comment) Precaution Comments: watch BP and O2 Restrictions Weight Bearing Restrictions Per Provider Order: No Other Position/Activity Restrictions: R chest tube      Mobility Bed Mobility               General bed mobility comments: Pt sitting EOB at beginning of session and in recliner at end of session    Transfers Overall transfer level: Needs assistance Equipment used: Rolling walker (2 wheels) Transfers: Sit to/from Stand, Bed to chair/wheelchair/BSC Sit to Stand: Min assist, +2 physical assistance, From elevated surface     Step pivot transfers: Min assist, +2 physical assistance, From elevated surface            Balance Overall balance assessment: Needs assistance Sitting-balance support: Single extremity supported, No upper extremity supported, Feet supported Sitting balance-Leahy Scale: Good     Standing balance support: Single extremity supported, Bilateral upper extremity supported, During functional activity, Reliant on assistive device for balance Standing balance-Leahy Scale: Poor Standing balance comment: reliant on RW for balance  ADL either performed or assessed with clinical judgement   ADL Overall ADL's : Needs assistance/impaired Eating/Feeding: Independent;Sitting   Grooming: Set up;Sitting   Upper Body Bathing: Moderate  assistance;Sitting;Cueing for compensatory techniques   Lower Body Bathing: Moderate assistance;Maximal assistance;Sit to/from stand;Sitting/lateral leans;Cueing for compensatory techniques   Upper Body Dressing : Minimal assistance;Sitting   Lower Body Dressing: Moderate assistance;Sitting/lateral leans;Sit to/from stand;Cueing for compensatory techniques   Toilet Transfer: Minimal assistance;+2 for physical assistance;BSC/3in1;Rolling walker (2 wheels) (step-pivot transfer) Toilet Transfer Details (indicate cue type and reason): simulated to chair Toileting- Clothing Manipulation and Hygiene: Moderate assistance;+2 for physical assistance;Cueing for compensatory techniques;Sit to/from stand       Functional mobility during ADLs: Minimal assistance;+2 for physical assistance;Rolling walker (2 wheels) General ADL Comments: Pt with decreased activity tolerance and with funcitonal level affected by pain around site of R chest tube     Vision Baseline Vision/History: 0 No visual deficits Ability to See in Adequate Light: 0 Adequate Patient Visual Report: No change from baseline       Perception         Praxis         Pertinent Vitals/Pain Pain Assessment Pain Assessment: Faces Faces Pain Scale: Hurts even more Pain Location: R flank and R side of abdomen around Pain Descriptors / Indicators: Aching, Discomfort, Grimacing, Sore Pain Intervention(s): Limited activity within patient's tolerance, Monitored during session, Repositioned, Patient requesting pain meds-RN notified, RN gave pain meds during session     Extremity/Trunk Assessment Upper Extremity Assessment Upper Extremity Assessment: Right hand dominant;RUE deficits/detail;LUE deficits/detail (gross strength 4 to 4+/5) RUE Deficits / Details: mild shoulder flexion deficits at baseline but WFL LUE Deficits / Details: mild shoulder flexion deficits at baseline but [facility]   Lower Extremity Assessment Lower Extremity  Assessment: Defer to PT evaluation   Cervical / Trunk Assessment Cervical / Trunk Assessment: Kyphotic   Communication Communication Communication: No apparent difficulties;Hearing impairment (benefited from mildly increased volume) Cueing Techniques: Verbal cues;Visual cues   Cognition Arousal: Alert Behavior During Therapy: WFL for tasks assessed/performed, Flat affect (Flat affect throughout but with behavior WFL) Overall Cognitive Status: Within Functional Limits for tasks assessed                                 General Comments: AAOx4 and pleasant throughout. Pt required cues to take breaks. Uncertain if this is due to decreased insight/safety awareness or pt attempting to push himself. Pt's wife reports he is "stoic" and does not like to complain or acknowledge deficits at baseline.     General Comments  Pt with episode of orthostatic hypotension during session with BP in supine 99/65, 92/63 in sitting, 84/62 in standing, 83/59 in standing at 3 minutes, and 80/62 in standing following ambulation. Pt with O2 sat at 100% on 2L continuous O2 through nasal cannula at rest, but requiring 4L continuous O2 through nasal cannula to maintain O2 sat at >/86% during functional mobility and tasks in standing.    Exercises     Shoulder Instructions      Home Living Family/patient expects to be discharged to:: Private residence Living Arrangements: Spouse/significant other Available Help at Discharge: Family;Available 24 hours/day;Neighbor;Available PRN/intermittently Type of Home: House Home Access: Stairs to enter [employer] of Steps: 3 (from garage) Entrance Stairs-Rails: Right Home Layout: One level;Laundry or work area in basement;Able to live on main level with bedroom/bathroom (stays on main level)  Bathroom Shower/Tub: Producer, television/film/video: Handicapped height Bathroom Accessibility: Yes How Accessible: Accessible via walker Home  Equipment: Agricultural consultant (2 wheels);Cane - quad;Wheelchair - manual;Hand held Engineer, civil (consulting) - single point          Prior Functioning/Environment Prior Level of Function : Independent/Modified Independent;Driving             Mobility Comments: Independent to Mod I for short household distances without an AD ADLs Comments: Independent with ADLs, drives        OT Problem List: Decreased activity tolerance;Impaired balance (sitting and/or standing);Decreased knowledge of use of DME or AE;Decreased safety awareness;Cardiopulmonary status limiting activity;Pain      OT Treatment/Interventions: Self-care/ADL training;Therapeutic exercise;Energy conservation;DME and/or AE instruction;Therapeutic activities;Patient/family education;Balance training    OT Goals(Current goals can be found in the care plan section) Acute Rehab OT Goals Patient Stated Goal: to have less pain and return home OT Goal Formulation: With patient/family Time For Goal Achievement: 04/13/23 Potential to Achieve Goals: Good ADL Goals Pt Will Perform Upper Body Bathing: with supervision;sitting Pt Will Perform Lower Body Bathing: with contact guard assist;sitting/lateral leans;sit to/from stand Pt Will Perform Lower Body Dressing: with contact guard assist;sitting/lateral leans;sit to/from stand Pt Will Transfer to Toilet: with contact guard assist;ambulating;bedside commode (with least restrictive AD) Pt Will Perform Toileting - Clothing Manipulation and hygiene: with contact guard assist;sitting/lateral leans;sit to/from stand  OT Frequency: Min 1X/week    Co-evaluation              AM-PAC OT "6 Clicks" Daily Activity     Outcome Measure Help from another person eating meals?: None Help from another person taking care of personal grooming?: A Little Help from another person toileting, which includes using toliet, bedpan, or urinal?: A Lot Help from another person bathing (including washing, rinsing,  drying)?: A Lot Help from another person to put on and taking off regular upper body clothing?: A Little Help from another person to put on and taking off regular lower body clothing?: A Lot 6 Click Score: 16   End of Session Equipment Utilized During Treatment: Gait belt;Rolling walker (2 wheels);Oxygen Nurse Communication: Mobility status;Patient requests pain meds;Other (comment) (vital signs)  Activity Tolerance: Patient tolerated treatment well;Treatment limited secondary to medical complications (Comment) (soft BP and decreased O2 sat with activity) Patient left: in chair;with call bell/phone within reach;with chair alarm set;with nursing/sitter in room;with family/visitor present  OT Visit Diagnosis: Other abnormalities of gait and mobility (R26.89);Other (comment) (decreased activity tolerance)                Time: 1001-1046 OT Time Calculation (min): 45 min Charges:  OT General Charges $OT Visit: 1 Visit OT Evaluation $OT Eval Moderate Complexity: 1 Mod  [address] E. Myers DriveMolson Coors Brewing., OTR/L, MA Acute Rehab [phone]   Walt Gunner 03/30/2023, 3:31 PM

## 2023-03-30 NOTE — Assessment & Plan Note (Deleted)
 Rate controlled, however hypotensive.  Hold metoprolol, may need Amio. - Cardiology consulted, appreciate recommendations - Heparin gtt - Cardiac monitoring

## 2023-03-30 NOTE — Progress Notes (Signed)
 Daily Progress Note Intern Pager: [phone]  Patient name: Zachary Parrish Medical record number: [id number] Date of birth: 03-04-1941 Age: 83 y.o. Gender: male  Primary Care Provider: Radiontchenko, Alexei, MD Consultants: Cardiology, PCCM Code Status: Full Code  Pt Overview and Major Events to Date:  1/14: Admitted  Assessment and Plan: Benajmin Dunham is a 83 y.o. male presenting with PMHx A Fib, CAD, T2DM, HTN, CKD, and anemia admitted for AHRF and pleural effusion likely in the setting of acute heart failure exacerbation.  Assessment & Plan Acute respiratory failure with hypoxia (HCC) Increased work of breathing, on 2L LFNC saturating at 100%, was on RA during his Echo this AM.  S/p IV Lasix  80 mg x 1.  Chest tube placed by PCCM to help improve WOB.  Echo demonstrated LVEF 50-55%, moderate LVH, RV mildly enlarged, LA and RA mildly dilated, and trivial MVR.  - Pulmonology consulted, appreciate recommendations - PT/OT to treat - Supplemental O2 - Re-dose IV Lasix  80 mg Acute systolic heart failure (HCC) Echo demonstrated LVEF 50-55%, moderate LVH, RV mildly enlarged, LA and RA mildly dilated, and trivial MVR.  S/p IV Lasix  80 mg x 1. - Cariology consulted, appreciate recommendations - Keep feet elevated and wear compression stockings. - Daily weights - Strict I/Os - Re-dose IV Lasix  80 mg Pleural effusion CT chest demonstrated large R pleural effusion with pleural thickening and very small left pleural effusion. S/p IV Lasix  80 mg and chest tube placement for drainage. ~1500 cc of dark red fluid drained from R chest tube overnight. Fluid culture grew S. aureus empyema, unclear if this began as PNA but he has hx of staph bacteremia so concern for possible recurrent bacteremia.  - Repeat CT scan when pleural effusion has been adequately evacuated (high risk for trapped lung w/ radiographic appearance).  - Due to chronicity of presentation this is suspected to be an exudative process,  may require lytics - s/p ceftriaxone  and azithromycin  (1/14) and switched to CTX and Linezolid  (1/15-), de-escalate based on sensitivities - Daily weights - Strict I/Os - Pulmonology consulted, appreciate recommendations Atrial fibrillation with RVR (HCC) Rate controlled, however hypotensive. Likely seocndary to acute lung process. Hold metoprolol , may need Amio vs TEE/DCCV. - Heparin  gtt - Cardiac monitoring - Cardiology consulted, appreciate recommendations Hypotension BP 70-120s/50-90s. Reports being normotensive at home with his medications - Coreg  25 mg BID and Telmisartan 20 mg daily for HTN. - Cardiology consulted, appreciate recs - Hold home meds - Hold diuresis given hypotension - Low threshold for ICU with instability Urine retention Concern for urinary retention, however bladder scans have demonstrated no retention, but reports being oliguric. UA demonstrated significant proteinuria >300, concern for nephrotic syndrome especially with the increase in Cr, low albumin , and peripheral edema as well.  - Bladder scan - Nephrology consulted, appreciate recommendations.  CKD (chronic kidney disease) Creatinine 2.8, EGFR 22. - UA - Caution with nephrotoxic agents - Nephrology consulted, appreciate recommendations Anemia of chronic disease Likely in the setting of CKD. Receives ESA if Hgb <10.  - Monitor with AM CBC w/ diff Alcohol  abuse Hx of alcohol  use daily with vodka.  - CIWAs without atiivan - PT/INR - RUQ US  to assess for cirrhosis - Folate - MV - Thiamine  Thyroid  lesion TSH 3.279 on admission. CT chest w/o contrast revealed 2.4 cm cystic lesion in the right thyroid  lobe.  - CTM  Chronic and Stable Problems:  HLD - Continue Lipitor 40 mg tablet Low back pain - likely due  to sedentary lifestyle and age, no acute findings on exam  FEN/GI: Heart Healthy PPx: Heparin  Dispo: pending clinical improvement  Subjective:  Patient reports that he feels a little bit  better this morning after chest tube placement and drainage.  He is less dyspneic.  No other concerns at this time.  Getting echo upon exam.  Objective: Temp:  [97.4 F (36.3 C)-98.3 F (36.8 C)] 98.1 F (36.7 C) (01/15 1135) Pulse Rate:  [43-111] 102 (01/15 1505) Resp:  [13-24] 20 (01/15 1135) BP: (70-104)/(49-70) 71/49 (01/15 1505) SpO2:  [91 %-100 %] 100 % (01/15 1505) Weight:  [94.5 kg] 94.5 kg (01/15 0200) Physical Exam: General: Chronically ill-appearing male with mild dyspnea Cardiovascular: Irregularly irregular.  No M/R/G. Respiratory: On RA.  Improvement in crackles and diminished breath sounds over the right from previous exam.  No wheezing noted. Abdomen: Soft, nontender, nondistended.  Normoactive bowel sounds. Extremities: Improvement in BLE edema to 1+.  Laboratory: Most recent CBC Lab Results  Component Value Date   WBC 11.2 (H) 03/30/2023   HGB 10.4 (L) 03/30/2023   HCT 32.6 (L) 03/30/2023   MCV 101.6 (H) 03/30/2023   PLT 135 (L) 03/30/2023   Most recent BMP    Latest Ref Rng & Units 03/30/2023    2:51 AM  BMP  Glucose 70 - 99 mg/dL 409   BUN 8 - 23 mg/dL 59   Creatinine 8.11 - 1.24 mg/dL 9.14   Sodium 782 - 956 mmol/L 134   Potassium 3.5 - 5.1 mmol/L 4.9   Chloride 98 - 111 mmol/L 97   CO2 22 - 32 mmol/L 24   Calcium  8.9 - 10.3 mg/dL 8.3     UA: proteinuria (>300), few bacteria  BNP: 346.8 Body fluid: Total nucleated cell count 10K, neut count 98  Imaging/Diagnostic Tests: CXR:  Mild interval improvement in the right hemithorax opacification, s/p R pleural drainage catheter placement.   RUQ US : Limited but grossly unremarkable exam.  Renal US : Negative for hydronephrosis. Possible mild left renal cortical thinning but left kidney difficult to visualize due to habitus and bowel gas.  Clyda Dark, DO 03/30/2023, 3:09 PM PGY-1, [facility]  FPTS Intern pager: [phone], text pages welcome Secure chat group [facility] Teaching Service

## 2023-03-30 NOTE — Assessment & Plan Note (Addendum)
 Echo demonstrated LVEF 50-55%, moderate LVH, RV mildly enlarged, LA and RA mildly dilated, and trivial MVR.  S/p IV Lasix  80 mg x 1. - Cariology consulted, appreciate recommendations - Keep feet elevated and wear compression stockings. - Daily weights - Strict I/Os - Re-dose IV Lasix  80 mg

## 2023-03-30 NOTE — Assessment & Plan Note (Addendum)
 TSH 3.279 on admission. CT chest w/o contrast revealed 2.4 cm cystic lesion in the right thyroid lobe.  - CTM

## 2023-03-30 NOTE — Progress Notes (Signed)
 Progress Note  Patient Name: Zachary Parrish Date of Encounter: 03/30/2023  Primary Cardiologist:   Eilleen Grates, MD   Subjective   Still congested but no distress.  Ambulated slightly in the hallway.    Inpatient Medications    Scheduled Meds:  aspirin   81 mg Oral Daily   atorvastatin   40 mg Oral Daily   folic acid   1 mg Oral Daily   multivitamin with minerals  1 tablet Oral Daily   sodium chloride  flush  10 mL Intrapleural Q8H   thiamine   100 mg Oral Daily   Continuous Infusions:  heparin  1,400 Units/hr (03/30/23 0057)   PRN Meds: acetaminophen  **OR** acetaminophen , oxyCODONE , perflutren  lipid microspheres (DEFINITY ) IV suspension   Vital Signs    Vitals:   03/30/23 0130 03/30/23 0200 03/30/23 0400 03/30/23 0730  BP: (!) 83/53 (!) 82/56 90/62 95/70   Pulse: 96 100 (!) 103 (!) 111  Resp: (!) 22  18 18   Temp:  97.9 F (36.6 C)  (!) 97.4 F (36.3 C)  TempSrc:  Oral  Oral  SpO2: 100% 100% 99% 92%  Weight:  94.5 kg    Height:        Intake/Output Summary (Last 24 hours) at 03/30/2023 0933 Last data filed at 03/30/2023 5093 Gross per 24 hour  Intake 470 ml  Output 1545 ml  Net -1075 ml   Filed Weights   03/29/23 1200 03/30/23 0200  Weight: 95.3 kg 94.5 kg    Telemetry    Atrial fib with rapid rate - Personally Reviewed  ECG    NA - Personally Reviewed  Physical Exam   GEN: No acute distress.   Neck: No  JVD Cardiac: Irregular RR, no murmurs, rubs, or gallops.  Respiratory:    Decreased breath sounds 2/3 up the right lung.  Slight improvement. GI: Soft, nontender, non-distended  MS: No  edema; No deformity. Neuro:  Nonfocal  Psych: Normal affect   Labs    Chemistry Recent Labs  Lab 03/29/23 0903 03/29/23 1017 03/29/23 1832 03/30/23 0251  NA 136 136  --  134*  K 4.8 4.7  --  4.9  CL 97* 97*  --  97*  CO2 26  --   --  24  GLUCOSE 155* 156*  --  138*  BUN 48* 48*  --  59*  CREATININE 2.83* 2.80*  --  3.79*  CALCIUM  8.8*  --   --   8.3*  PROT 6.8  --  6.2* 6.5  ALBUMIN  2.8*  --   --  2.7*  AST 28  --   --  24  ALT 24  --   --  23  ALKPHOS 84  --   --  68  BILITOT 1.9*  --   --  1.3*  GFRNONAA 22*  --   --  15*  ANIONGAP 13  --   --  13     Hematology Recent Labs  Lab 03/29/23 0903 03/29/23 1017 03/30/23 0251  WBC 14.0*  --  11.2*  RBC 3.32*  --  3.21*  HGB 10.9* 11.6* 10.4*  HCT 33.3* 34.0* 32.6*  MCV 100.3*  --  101.6*  MCH 32.8  --  32.4  MCHC 32.7  --  31.9  RDW 17.1*  --  17.2*  PLT 147*  --  135*    Cardiac EnzymesNo results for input(s): "TROPONINI" in the last 168 hours. No results for input(s): "TROPIPOC" in the last 168 hours.   BNP Recent  Labs  Lab 03/30/23 0251  BNP 346.8*     DDimer No results for input(s): "DDIMER" in the last 168 hours.   Radiology    DG Chest Port 1 View Result Date: 03/30/2023 CLINICAL DATA:  142230 Pleural effusion 142230 S/P right hemithorax opacification, status post right pleural drainage catheter placement. Evaluate pneumothorax EXAM: PORTABLE CHEST 1 VIEW COMPARISON:  Chest x-ray 03/29/2023, CT chest 03/29/2023 FINDINGS: The heart and mediastinal contours are unchanged. Atherosclerotic plaque No focal consolidation. No pulmonary edema. Similar-appearing right loculated large volume pleural effusion. Persistent trace left pleural effusion. No pneumothorax. No acute osseous abnormality. IMPRESSION: 1.  Similar-appearing right loculated large volume pleural effusion. 2. Persistent trace left pleural effusion. 3. No pneumothorax. 4.  Aortic Atherosclerosis (ICD10-I70.0). Electronically Signed   By: Morgane  Naveau M.D.   On: 03/30/2023 01:06   US  RENAL Result Date: 03/29/2023 CLINICAL DATA:  Oliguria EXAM: RENAL / URINARY TRACT ULTRASOUND COMPLETE COMPARISON:  06/03/2019 FINDINGS: Right Kidney: Renal measurements: 9.3 x 5.6 x 6.2 cm = volume: 168 mL. Echogenicity within normal limits. No mass or hydronephrosis visualized. Left Kidney: Renal measurements: 10.3 x 4.9  x 4.4 cm = volume: 118 mL. Poorly visible due to habitus and gas. No hydronephrosis or obvious mass. Possible mild renal cortical thinning. Bladder: Appears normal for degree of bladder distention. Other: None. IMPRESSION: Negative for hydronephrosis. Possible mild left renal cortical thinning but left kidney difficult to visualize due to habitus and bowel gas. Electronically Signed   By: Esmeralda Hedge M.D.   On: 03/29/2023 21:38   US  Abdomen Limited RUQ (LIVER/GB) Result Date: 03/29/2023 CLINICAL DATA:  213305 Alcohol  use 213305. EXAM: ULTRASOUND ABDOMEN LIMITED RIGHT UPPER QUADRANT COMPARISON:  None Available. FINDINGS: Examination is limited due to patient related factors. Gallbladder: No gallstones or wall thickening visualized. No sonographic Murphy sign noted by sonographer. Common bile duct: Diameter: Less than 4 mm.  No intrahepatic bile duct dilation. Liver: Not seen in the entirety. Left hepatic lobe is obscured by bowel gas. No focal lesion seen in the visualized portion. Within normal limits in parenchymal echogenicity. Portal vein is patent on color Doppler imaging with normal direction of blood flow towards the liver. Other: None. IMPRESSION: *Limited but grossly unremarkable exam. Electronically Signed   By: Beula Brunswick M.D.   On: 03/29/2023 18:27   DG Chest Port 1 View Result Date: 03/29/2023 CLINICAL DATA:  [id number] S/P thoracostomy tube placement (HCC) [id number]. EXAM: PORTABLE CHEST 1 VIEW COMPARISON:  03/29/2023, 9:33 a.m. FINDINGS: Redemonstration of mild opacification of right hemithorax, mildly improved since the prior study. Findings may represent underlying pleural effusion and atelectasis with or without consolidation. There is new right-sided pleural drainage catheter. There is apparent lucency overlying the right upper lung zone, concerning for a small pneumothorax. Attention on follow-up examination is recommended. Left lung is grossly clear. Stable cardio-mediastinal silhouette. No  acute osseous abnormalities. The soft tissues are within normal limits. IMPRESSION: *Mild interval improvement in the right hemithorax opacification, status post right pleural drainage catheter placement. There is apparent lucency overlying the right upper lung zone, concerning for a small pneumothorax. Attention on follow-up examination is recommended. Electronically Signed   By: Beula Brunswick M.D.   On: 03/29/2023 16:48   CT CHEST WO CONTRAST Result Date: 03/29/2023 CLINICAL DATA:  Respiratory illness. EXAM: CT CHEST WITHOUT CONTRAST TECHNIQUE: Multidetector CT imaging of the chest was performed following the standard protocol without IV contrast. RADIATION DOSE REDUCTION: This exam was performed according to the departmental dose-optimization program  which includes automated exposure control, adjustment of the mA and/or kV according to patient size and/or use of iterative reconstruction technique. COMPARISON:  X-ray earlier 03/29/2023. FINDINGS: Cardiovascular: Trace pericardial fluid or thickening. Heart slightly enlarged. Coronary artery calcifications are seen. Prominent epicardial fat. The thoracic aorta has a overall normal course and caliber with scattered diffuse calcified plaque. There is a bovine type aortic arch, normal variant. Significant stenosis suggested at the origin of the right subclavian artery and right common carotid artery additional evaluation with CTA can be performed when clinically appropriate. There is also significant plaque along the left common carotid artery origin. Mediastinum/Nodes: Slightly patulous thoracic esophagus with some luminal gas. Small thyroid  gland with a cystic nodule involving the right thyroid  lobe measuring 2.4 by 1.4 cm. Recommend further workup with thyroid  ultrasound when appropriate if there is no known history. On this non IV contrast exam there is no specific abnormal lymph node enlargement present in the axillary regions hilum. Several small less than 1  cm in size in short axis nodes are seen in the mediastinum, nonpathologic by size criteria. There is some edema along the mediastinum inferiorly on the right side. Prominent supraclavicular node on the left on series 3, image 9 has short axis of 10 mm. Lungs/Pleura: There is a calcified left lower lobe lung nodule series 3, image 52 consistent with old granulomatous disease. Tiny left pleural effusion. No left-sided consolidation, pneumothorax otherwise. Right lung has a large pleural effusion with pleural thickening. The inferior aspect is incompletely included in the imaging field. Opacity seen in the visualized portions of the right lung with a rounded areas of opacity along the right lower lobe. Although this could be an infiltrate trait this also could be an area of rounded atelectasis. Upper Abdomen: Limited visualization of the upper abdomen. The adrenal glands are incompletely included in the imaging field. Spleen is enlarged measuring at least 15.5 cm where visible. Musculoskeletal: Mild degenerative changes along the spine. Old left chronic appearing rib deformities. IMPRESSION: Large right pleural effusion with pleural thickening. Associated opacity including rounded areas of opacity in the right lower lobe. Although this could be an infiltrate or lesion this could be an area of rounded atelectasis. Please correlate for any known history or prior. Very small left pleural effusion. Splenomegaly. Incompletely included in the imaging field. Further workup when clinically appropriate. 2.4 cm cystic lesion in the right thyroid  lobe. Please correlate with any prior dedicated thyroid  ultrasound when appropriate. Enlarged heart with the extensive vascular calcifications including potential significant stenosis along the great vessels including brachiocephalic, common carotid and right subclavian arteries. Further workup with CTA when clinically appropriate. Nonspecific mediastinal stranding with some small nodes  including 1 slightly abnormally enlarged supraclavicular node on the left side. Aortic Atherosclerosis (ICD10-I70.0). Electronically Signed   By: Adrianna Horde M.D.   On: 03/29/2023 11:42   DG Chest 2 View Result Date: 03/29/2023 CLINICAL DATA:  Shortness of breath for 3 weeks EXAM: CHEST - 2 VIEW COMPARISON:  05/31/2019 FINDINGS: Large right pleural effusion. Small left. Underinflation with enlarged heart. Calcified aorta. No pneumothorax or edema. Film is under penetrated. IMPRESSION: Large right pleural effusion and tiny left. Underinflation. Enlarged cardiopericardial silhouette. Please correlate with history or further workup when appropriate. Electronically Signed   By: Adrianna Horde M.D.   On: 03/29/2023 10:05    Cardiac Studies   Echo:    1. Technically difficult study with poor windows and patient in Afib with  RVR. Left ventricular ejection fraction,  by estimation, is 50 to 55%. The  left ventricle has low normal function. The left ventricle has no regional  wall motion abnormalities.  There is moderate left ventricular hypertrophy. Left ventricular diastolic  parameters are indeterminate.   2. Right ventricular systolic function is moderately reduced. The right  ventricular size is mildly enlarged.   3. Left atrial size was moderately dilated.   4. Right atrial size was mildly dilated.   5. The mitral valve is normal in structure. Trivial mitral valve  regurgitation. No evidence of mitral stenosis.   6. The aortic valve was not well visualized. Aortic valve regurgitation  is not visualized. No aortic stenosis is present.   Patient Profile     83 y.o. male  with a hx of CAD, DRESS syndrome, PAF, hypertension, DM2, CKD stage IV followed by [facility], carotid artery disease, anemia and EtOH abuse who is being seen 03/29/2023 for the evaluation of atrial fibrillation with RVR and CHF at the request of Dr. Drue Gerald.   Assessment & Plan    Atrial fib with RVR:    I think  this might be secondary to an acute lung process.  Continue heparin .  Continue to treat infection/effusion.  Unable to titrate meds for HR control.  I would not use amiodarone  at this time.  Will consider TEE/DCCV when acute issues improved.   Pleural effusion:   10K WBC.   Critical care following.   He is on antibiotics.  Discussed with CCM and he will get further intervention on his effusion.    Acute HF:  EF is low normal although he has RV dysfunction.  This will make diuresis more difficult.  Needs to keep feet elevated and wear compression stockings.  Net negative is 1.2 liters but almost all from the chest tube.  Needs to have nephrology involved.   We can give Lasix  this morning but we are limited by hypotension, RV dysfunction, infection.    Fluid is loculated and CXR unchanged.  Management as above.    Hypotension:  See above.     CAD:  Distant cath with LM disease.  No active evidence of ischemia.    CKD:  As above.  Need nephrology consultation.    HTN:  Currently hypotensive.   For questions or updates, please contact [facility] Please consult [email or web address] for contact info under Cardiology/STEMI.   Signed, Eilleen Grates, MD  03/30/2023, 9:33 AM

## 2023-03-30 NOTE — TOC Progression Note (Signed)
 Transition of Care [facility]) - Progression Note    Patient Details  Name: Zachary Parrish MRN: [id number] Date of Birth: [birth year]  [facility] CM/SW Contact  Jennett Model, RN Phone Number: 03/30/2023, 3:14 PM  Clinical Narrative:    Patient going to ICU, bp soft. TOC following.   Expected Discharge Plan: Home/Self Care Barriers to Discharge: Continued Medical Work up  Expected Discharge Plan and Services In-house Referral: NA Discharge Planning Services: CM Consult Post Acute Care Choice: NA Living arrangements for the past 2 months: Single Family Home                 DME Arranged: N/A DME Agency: NA       HH Arranged: NA           Social Determinants of Health (SDOH) Interventions SDOH Screenings   Food Insecurity: No Food Insecurity (03/30/2023)  Housing: Low Risk  (03/30/2023)  Transportation Needs: No Transportation Needs (03/30/2023)  Utilities: Not At Risk (03/30/2023)  Financial Resource Strain: Low Risk  (01/24/2023)   Received from [facility]  Physical Activity: Insufficiently Active (01/24/2023)   Received from [facility]  Social Connections: Socially Integrated (03/30/2023)  Stress: No Stress Concern Present (01/24/2023)   Received from [facility]  Tobacco Use: High Risk (03/29/2023)    Readmission Risk Interventions     No data to display

## 2023-03-30 NOTE — Assessment & Plan Note (Signed)
 Rate controlled, however hypotensive. Likely seocndary to acute lung process. Hold metoprolol , may need Amio vs TEE/DCCV. - Heparin  gtt - Cardiac monitoring - Cardiology consulted, appreciate recommendations

## 2023-03-30 NOTE — Assessment & Plan Note (Addendum)
 Hx of alcohol use daily with vodka.  - CIWAs without atiivan - PT/INR - RUQ [place] to assess for cirrhosis - Folate - MV - Thiamine

## 2023-03-30 NOTE — Evaluation (Addendum)
 Physical Therapy Evaluation Patient Details Name: Zachary Parrish MRN: [id number] DOB: 13-Nov-1940 Today's Date: 03/30/2023  History of Present Illness  Zachary Parrish is an 83 year old gentleman admitted 1/14 presenting with greater than 2 months of progressive shortness of breath and leg edema.  Over the last 2 weeks he has felt worse such that he is short of breath even at rest.  He has been sleeping sitting upright sometimes in his car to prop him up enough. Pt also with afib.  Pt with chest tube placed 1/14 due to pleural effusion. PMH:  A-fib many years ago, status post ablation and not recurrent so not on anticoagulation, CKD 3B  Clinical Impression  Pt admitted with above diagnosis. Pt was able to progress ambulation to hallway with min assist of 2 persons with 3rd person for follow for chair.  Close monitoring of BP with initial BP 99/65 and at end of session 83/57 with pt in chair.  Pt needed 2LO2 at rest and 4LO2 with activity to maintain Saturations above 86%.   Pt currently with functional limitations due to the deficits listed below (see PT Problem List). Pt will benefit from acute skilled PT to increase their independence and safety with mobility to allow discharge.       Orthostatic BPs  Supine 99/65  Sitting 92.63  Standing 84/62  Standing after 3 min 83/59           BP dropped to 80/62 with walking with pt c/o dizziness/fatigued therefore had pt sit down at 60 feet.   If plan is discharge home, recommend the following: Two people to help with walking and/or transfers;Assistance with cooking/housework;A lot of help with bathing/dressing/bathroom;Assist for transportation;Help with stairs or ramp for entrance   Can travel by private vehicle        Equipment Recommendations Other (comment) (home Oxygen)  Recommendations for Other Services       Functional Status Assessment Patient has had a recent decline in their functional status and demonstrates the ability to make significant  improvements in function in a reasonable and predictable amount of time.     Precautions / Restrictions Precautions Precautions: Fall;Other (comment) Precaution Comments: watch BP and O2, chest tube right side Restrictions Weight Bearing Restrictions Per Provider Order: No Other Position/Activity Restrictions: R chest tube      Mobility  Bed Mobility               General bed mobility comments: Pt sitting EOB at beginning of session and in recliner at end of session    Transfers Overall transfer level: Needs assistance Equipment used: Rolling walker (2 wheels) Transfers: Sit to/from Stand Sit to Stand: Min assist, +2 physical assistance, From elevated surface           General transfer comment: Pt stood to RW with min assist of 2 persons.Pt had orthostatic BP but was able to tolerate standing without significant dizziness.    Ambulation/Gait Ambulation/Gait assistance: Min assist, +2 safety/equipment (3rd person pushed chair behind pt) Gait Distance (Feet): 60 Feet Assistive device: Rolling walker (2 wheels) Gait Pattern/deviations: Step-through pattern, Decreased stride length, Trunk flexed, Wide base of support   Gait velocity interpretation: <1.31 ft/sec, indicative of household ambulator   General Gait Details: Pt was able to progress ambulation to hallway with cues to stay close to RW and assist for posture and for safety.  Maintains flexed trunk which wife states was premorbid.  Monitored BP and pt without c/o dizziness. On 2LO2 on arrival and needed  4LO2 wiht ambulation to keep sats >90%.  Followed pt with chair and he sat after ambulating 60 feet.  Stairs            Wheelchair Mobility     Tilt Bed    Modified Rankin (Stroke Patients Only)       Balance Overall balance assessment: Needs assistance Sitting-balance support: Single extremity supported, No upper extremity supported, Feet supported Sitting balance-Leahy Scale: Good     Standing  balance support: Single extremity supported, Bilateral upper extremity supported, During functional activity, Reliant on assistive device for balance Standing balance-Leahy Scale: Poor Standing balance comment: reliant on RW for balance                             Pertinent Vitals/Pain Pain Assessment Pain Assessment: Faces Faces Pain Scale: Hurts even more Pain Location: R flank and R side of abdomen around Pain Descriptors / Indicators: Aching, Discomfort, Grimacing, Sore Pain Intervention(s): Limited activity within patient's tolerance, Monitored during session, Repositioned, Premedicated before session    Home Living Family/patient expects to be discharged to:: Private residence Living Arrangements: Spouse/significant other Available Help at Discharge: Family;Available 24 hours/day;Neighbor;Available PRN/intermittently Type of Home: House Home Access: Stairs to enter Entrance Stairs-Rails: Right Entrance Stairs-Number of Steps: 3 (from garage)   Home Layout: One level;Laundry or work area in basement;Able to live on main level with bedroom/bathroom (stays on main level) Home Equipment: Agricultural consultant (2 wheels);Cane - quad;Wheelchair - manual;Hand held Engineer, civil (consulting) - single point      Prior Function Prior Level of Function : Independent/Modified Independent;Driving             Mobility Comments: Independent to Mod I for short household distances without an AD ADLs Comments: Indeepndent with ADLs, drives     Extremity/Trunk Assessment   Upper Extremity Assessment Upper Extremity Assessment: Defer to OT evaluation RUE Deficits / Details: mild shoulder flexion deficits at baseline but WFL LUE Deficits / Details: mild shoulder flexion deficits at baseline but [facility]    Lower Extremity Assessment Lower Extremity Assessment: Generalized weakness    Cervical / Trunk Assessment Cervical / Trunk Assessment: Kyphotic  Communication    Communication Communication: No apparent difficulties;Hearing impairment (benefited from mildly increased volume) Cueing Techniques: Verbal cues;Visual cues  Cognition Arousal: Alert Behavior During Therapy: WFL for tasks assessed/performed, Flat affect (Flat affect throughout but with behavior WFL) Overall Cognitive Status: Within Functional Limits for tasks assessed                                 General Comments: AAOx4 and pleasant throughout. Pt required cues to take breaks. Uncertain if this is due to decreased insight/safety awareness or pt attempting to push himself. Pt's wife reports he is "stoic" and does not like to complain or acknowledge deficits at baseline.        General Comments      Exercises General Exercises - Lower Extremity Long Arc Quad: AROM, Both, 5 reps, Seated   Assessment/Plan    PT Assessment Patient needs continued PT services  PT Problem List Decreased activity tolerance;Decreased balance;Decreased mobility;Decreased knowledge of use of DME;Decreased safety awareness;Decreased knowledge of precautions;Cardiopulmonary status limiting activity       PT Treatment Interventions DME instruction;Gait training;Functional mobility training;Therapeutic activities;Therapeutic exercise;Balance training;Patient/family education    PT Goals (Current goals can be found in the Care Plan section)  Acute  Rehab PT Goals Patient Stated Goal: to get stronger and be able to breathe PT Goal Formulation: With patient Time For Goal Achievement: 04/13/23 Potential to Achieve Goals: Good    Frequency Min 1X/week     Co-evaluation               AM-PAC PT "6 Clicks" Mobility  Outcome Measure Help needed turning from your back to your side while in a flat bed without using bedrails?: A Lot Help needed moving from lying on your back to sitting on the side of a flat bed without using bedrails?: A Lot Help needed moving to and from a bed to a chair  (including a wheelchair)?: A Little Help needed standing up from a chair using your arms (e.g., wheelchair or bedside chair)?: Total Help needed to walk in hospital room?: Total Help needed climbing 3-5 steps with a railing? : Total 6 Click Score: 10    End of Session Equipment Utilized During Treatment: Gait belt;Oxygen Activity Tolerance: Patient limited by fatigue Patient left: in chair;with call bell/phone within reach;with chair alarm set;with family/visitor present Nurse Communication: Mobility status PT Visit Diagnosis: Unsteadiness on feet (R26.81);Muscle weakness (generalized) (M62.81);Pain Pain - part of body:  (right abdomen)    Time: 1001-1046 PT Time Calculation (min) (ACUTE ONLY): 45 min   Charges:   PT Evaluation $PT Eval Moderate Complexity: 1 Mod PT Treatments $Gait Training: 8-22 mins PT General Charges $$ ACUTE PT VISIT: 1 Visit         Zachary Parrish M,PT Acute Rehab Services [phone]   Florencia Hunter 03/30/2023, 3:00 PM

## 2023-03-30 NOTE — Progress Notes (Signed)
 Patient's  blood pressure in the 70's. No urine output bladder scanned for 0. Md notified order to transfer to icu was received. Rapid response team notified and at bed side report given to 2 heart nurse.

## 2023-03-30 NOTE — Assessment & Plan Note (Addendum)
 Concern for urinary retention, however bladder scans have demonstrated no retention, but reports being oliguric. UA demonstrated significant proteinuria >300, concern for nephrotic syndrome especially with the increase in Cr, low albumin , and peripheral edema as well.  - Bladder scan - Nephrology consulted, appreciate recommendations.

## 2023-03-30 NOTE — Progress Notes (Addendum)
 NAME:  Zachary Parrish, MRN:  [id number], DOB:  1940/12/25, LOS: 1 ADMISSION DATE:  03/29/2023, CONSULTATION DATE:  1/14 REFERRING MD:  pRAY, CHIEF COMPLAINT:  EFFUSION    History of Present Illness:  This is a pleasant yet chronically ill-appearing 83 year old male who presents to the emergency room with chief complaint of approximately 2 and half months of progressive exertional dyspnea.  Initially noted shortness of breath back in early November primarily with activity, around Thanksgiving time reporting fairly significant increased work of breathing to the point that family noticed, of note about 1 month prior to that certain to notice some mild increase lower extremity edema.  By Christmas time his shortness of breath had gotten to the point of resting dyspnea, he was also transition to 4 pillow orthopnea and at times would actually go out and sleep in his car to be in the upright position.  He has had productive cough with clear mucus, no chest pain, no palpitations.  No sick exposures.  His legs have become more painful over the last week, and he presents to the emergency room primarily because he can only speak in 2-3 word phrases and was significantly weak and orthopneic.  In the ER Evaluation Was initiated this demonstrated the following: Serum creatinine 2.83 is not far from his baseline, glucose 155 white blood cell count 14 troponin I of 23, a chest x-ray was obtained this demonstrated a large right pleural effusion because of this a CT of chest was obtained to further evaluate.  CT of chest demonstrated a large right pleural effusion with pleural thickening and rind, some basilar consolidated atelectasis which appeared rounded in nature, and a very small left pleural effusion he also had a 2.5 cm cystic lesion on his right thyroid .  Because of the pleural effusion and dyspnea pulmonary was asked to evaluate.  Pertinent  Medical History  Atrial fibrillation not on anticoagulation but has had prior  cardioversion, carotid stenosis, CKD stage IIIb, hyperlipidemia, coronary artery disease, type 2 diabetes, hypertension  Significant Hospital Events: Including procedures, antibiotic start and stop dates in addition to other pertinent events   1/14 admitted with progressive subacute dyspnea and large chronic appearing pleural effusion with rind on the right.  Thoracostomy tube placed in the emergency room  Interim History / Subjective:  Reports feeling better, complains of chest pain at chest tube insertion site.  Objective   Blood pressure 95/70, pulse (!) 111, temperature (!) 97.4 F (36.3 C), temperature source Oral, resp. rate 18, height 6\' 1"  (1.854 m), weight 94.5 kg, SpO2 92%.        Intake/Output Summary (Last 24 hours) at 03/30/2023 0936 Last data filed at 03/30/2023 4098 Gross per 24 hour  Intake 470 ml  Output 1545 ml  Net -1075 ml   Filed Weights   03/29/23 1200 03/30/23 0200  Weight: 95.3 kg 94.5 kg    Examination: 83 year old male who appears somewhat demented No JVD or lymphadenopathy is appreciated Kyphoscoliosis is noted No JVD or lymphadenopathy is appreciated -Breath sounds in the right base Right chest tube without airleak remains on 20 cm of suction Abdomen is obese soft nontender 2+ pitting edema lower extremities Resolved Hospital Problem list     Assessment & Plan:  Acute hypoxic respiratory failure Large complicated right pleural effusion now status post tube thoracostomy 1/14 Small ex vacuo pneumothorax Acute heart failure Volume overload Atrial fibrillation Cardiorenal syndrome CKD stage IIIb Leukocytosis Thyroid  lesion Anemia of chronic disease History of alcohol  abuse Urinary  retention   Pulmonary problem list Subacute hypoxic respiratory failure in the setting of large right pleural effusion and associated atelectasis. Trapped lung Volume overload  Pleural fluid appears exudative in nature.  I do suspect at least some of his  presentation is cardiorenal in nature, however given how chronic the right pleural effusion appears I suspect this is an exudative process.  Differential includes infectious, inflammatory, or malignancy.  Given the chronicity of the effusion we elected to place a chest tube with concern that his right lung may not reexpand quickly, and there is small lucency noted on his post film  Plan  1400 cc of drainage overnight from right chest tube Right chest tube remains to suction. The fluid is not frank blood but is dark. Questionable lytics but will confirm with MD prior to instillation.  Note he is on a heparin  drip Follow evaluation of fluid Diuresis as tolerated     Best Practice (right click and "Reselect all SmartList Selections" daily)  PER PRIMARY   Labs   CBC: Recent Labs  Lab 03/29/23 0903 03/29/23 1017 03/30/23 0251  WBC 14.0*  --  11.2*  NEUTROABS  --   --  9.7*  HGB 10.9* 11.6* 10.4*  HCT 33.3* 34.0* 32.6*  MCV 100.3*  --  101.6*  PLT 147*  --  135*    Basic Metabolic Panel: Recent Labs  Lab 03/29/23 0903 03/29/23 0928 03/29/23 1017 03/30/23 0251  NA 136  --  136 134*  K 4.8  --  4.7 4.9  CL 97*  --  97* 97*  CO2 26  --   --  24  GLUCOSE 155*  --  156* 138*  BUN 48*  --  48* 59*  CREATININE 2.83*  --  2.80* 3.79*  CALCIUM  8.8*  --   --  8.3*  MG  --  2.1  --  2.2   GFR: Estimated Creatinine Clearance: 17.3 mL/min (A) (by C-G formula based on SCr of 3.79 mg/dL (H)). Recent Labs  Lab 03/29/23 0903 03/30/23 0251  WBC 14.0* 11.2*    Liver Function Tests: Recent Labs  Lab 03/29/23 0903 03/29/23 1832 03/30/23 0251  AST 28  --  24  ALT 24  --  23  ALKPHOS 84  --  68  BILITOT 1.9*  --  1.3*  PROT 6.8 6.2* 6.5  ALBUMIN  2.8*  --  2.7*   No results for input(s): "LIPASE", "AMYLASE" in the last 168 hours. No results for input(s): "AMMONIA" in the last 168 hours.  ABG    Component Value Date/Time   PHART 7.295 (L) 05/31/2019 2340   PCO2ART 30.2  (L) 05/31/2019 2340   PO2ART 82.0 (L) 05/31/2019 2340   HCO3 14.7 (L) 05/31/2019 2340   TCO2 28 03/29/2023 1017   ACIDBASEDEF 11.0 (H) 05/31/2019 2340   O2SAT 95.0 05/31/2019 2340     Coagulation Profile: Recent Labs  Lab 03/29/23 1832  INR 1.4*    Cardiac Enzymes: No results for input(s): "CKTOTAL", "CKMB", "CKMBINDEX", "TROPONINI" in the last 168 hours.  HbA1C: Hgb A1c MFr Bld  Date/Time Value Ref Range Status  03/29/2023 06:32 PM 6.6 (H) 4.8 - 5.6 % Final    Comment:    (NOTE) Pre diabetes:          5.7%-6.4%  Diabetes:              >6.4%  Glycemic control for   <7.0% adults with diabetes   06/01/2019 03:39 AM 7.2 (H)  4.8 - 5.6 % Final    Comment:    (NOTE) Pre diabetes:          5.7%-6.4% Diabetes:              >6.4% Glycemic control for   <7.0% adults with diabetes     CBG: No results for input(s): "GLUCAP" in the last 168 hours.   Siegfried Dress Minor ACNP Acute Care Nurse Practitioner [facility] Please consult Amion 03/30/2023, 9:36 AM   Attending attestation:  Mr. Ramchandani is an 83 y/o gentleman with CKD who presented with motnhs of progressive dyspnea and orthopnea, found to have a large effusion. Chest tube placed 1/14 with moderate response. Today he feels a little better and walked half way down the hall. Has chronic back pain that has worsened in his mid back (just above umbilicus level).  BP (!) 80/53 (BP Location: Right Arm)   Pulse 97   Temp 98.1 F (36.7 C) (Oral)   Resp 20   Ht 6\' 1"  (1.854 m)   Wt 94.5 kg   SpO2 99%   BMI 27.49 kg/m  Chronically ill appearing man lying in the recliner in NAD Plethoric face Breathing comfortably on [place], increased aeration on the R. Serosanguinous chest tube output, no air leak Abd soft, NT +edema Skin warm, dry, no diffuse rashes Awake, alert, answering questions.  CXR personally reviewed> partially resolved pleural effusion  Pleural fluid culture: staph aureus>  No blood cultures  collected at admission:  BUN 59 Cr 3.79 T bili 1.3 WBC 11.2 H/H 10.4/32.6  Assessment & plan: Staph aureus empyema; unclear if this began as a pneumonia at some point, but no productive cough. With history of staph bacteremia, this is concerning for possible recurrent bacteremia  Acute respiratory failure with hypoxia -blood cultures (unfortunately were being collected half way through ceftriaxone  infusing today) -restarted antibiotics-ceftriaxone  and linezolid ; deescalate based on sensitivities -worry about recurrent bacteremia and his back pain; recommend MRI L-spine -recommend ID consult -discussed recommendations with primary team -TPA & Dnase in chest tube; holding heparin  to reduce risk of hemothorax. D/w he an his wife to get verbal consent. He verbally consented for blood transfusion if he developed significant anemia from blood loss from intrapleural lytics.   Joesph Mussel, DO 03/30/23 2:11 PM [facility]  For contact information, see Amion. If no response to pager, please call PCCM consult pager. After hours, 7PM- 7AM, please call Elink.

## 2023-03-30 NOTE — Plan of Care (Signed)
 Dr. Telford Feather spoke with Dr. Edson Graces with nephrology who will see patient today for recommendations.

## 2023-03-30 NOTE — Plan of Care (Addendum)
 FMTS Interim Progress Note  Saw patient at bedside for repeat blood pressure being 70s over 40s.  Recheck blood pressure multiple times on both arms and continued to be 70s over 40s.  Also concerning that patient has not made any urine since 3 in the morning today.  Was redosed Lasix  by cardiology earlier this morning.  I touched base with attending Dr. Fulton Job with PCCM.  Met her at bedside and plan for transfer to ICU for peripheral pressors.  Our team will touch base with ID for consultation about history of staph bacteremia, consiuder septic shock in differential.  Pts extremities warm, he is very sleepy and states he feels very "weak."  Addendum: Consulted ID Dr. Seymour Dapper for history of staph bacteremia and patient growing staff aureus in pleural cultures.  Discussed that ceftriaxone  was running while blood cultures were being obtained so concerned that MSSA would not be detected.  Also concern for septic shock given echo overall okay.  We had ordered a lumbar MRI although patient is too unstable to get this due to focal tenderness on back exam seen by Dr. Fulton Job.  Likely will need TEE as well no vegetation seen on echocardiogram.  Zachary Kidd, MD 03/30/2023, 2:57 PM PGY-3, [facility] Family Medicine Service pager [phone]

## 2023-03-30 NOTE — Progress Notes (Addendum)
 Notified of low Bps by primary team- checked on both arms. Not making urine today  BP (!) 80/53 (BP Location: Right Arm)   Pulse 97   Temp 98.1 F (36.7 C) (Oral)   Resp 20   Ht 6\' 1"  (1.854 m)   Wt 94.5 kg   SpO2 99%   BMI 27.49 kg/m  Cuff repositioned, BP still 70s/55 with MAP 66.  Sleepy but arousable to stimulation Skin warm, dry  Discussed with RN, primary team, wife, patient.  Recommend transfer to ICU to ensure renal perfusion and MAP greater than 65 with renal dysfunction.  Suspect this is septic shock.  I had previously discussed with his wife that I have very significant concern that he is bacteremic with Staph aureus in his pleural space.  Plan: Peripheral pressors ordered, transfer to medical ICU. Infectious disease previously consulted by primary team, nephrology consulted by primary team MRI on hold until blood pressure more stable.  This patient is critically ill with multiple organ system failure which requires frequent high complexity decision making, assessment, support, evaluation, and titration of therapies. This was completed through the application of advanced monitoring technologies and extensive interpretation of multiple databases. During this encounter critical care time was devoted to patient care services described in this note for 31 minutes.   Joesph Mussel, DO 03/30/23 3:02 PM [facility]  For contact information, see Amion. If no response to pager, please call PCCM consult pager. After hours, 7PM- 7AM, please call Elink.    D/w nephrology, planning for CRRT. Temp dialysis catheter to be placed by NP Babcock.  Joesph Mussel, DO 03/30/23 3:53 PM [facility]  For contact information, see Amion. If no response to pager, please call PCCM consult pager. After hours, 7PM- 7AM, please call Elink.

## 2023-03-30 NOTE — Assessment & Plan Note (Addendum)
 Creatinine 2.8, EGFR 22. - UA - Caution with nephrotoxic agents - Nephrology consulted, appreciate recommendations

## 2023-03-30 NOTE — Progress Notes (Signed)
 Mobility Specialist Progress Note:    03/30/23 1230  Mobility  Activity Dangled on edge of bed  Level of Assistance Minimal assist, patient does 75% or more  Assistive Device Other (Comment) (HHA)  Activity Response Tolerated well  Mobility Referral Yes  Mobility visit 1 Mobility  Mobility Specialist Start Time (ACUTE ONLY) 0919  Mobility Specialist Stop Time (ACUTE ONLY) 0929  Mobility Specialist Time Calculation (min) (ACUTE ONLY) 10 min   Pt received trying to get to EOB, w/ bed alarm going off. Pt needed MinA to get to EOB and get situated. Offered the chair to the pt, pt refused. Call bell and personal belongings in reach. All needs met. Bed alarm on. Family in room.  Inetta Manes Mobility Specialist  Please contact vis Secure Chat or  Rehab Office [phone]

## 2023-03-30 NOTE — Assessment & Plan Note (Addendum)
 BP 70-120s/50-90s. Reports being normotensive at home with his medications - Coreg  25 mg BID and Telmisartan 20 mg daily for HTN. - Cardiology consulted, appreciate recs - Hold home meds - Hold diuresis given hypotension - Low threshold for ICU with instability

## 2023-03-30 NOTE — Procedures (Signed)
 Central Venous Catheter Insertion Procedure Note  Kiron Eggum  [id number]  May 12, 1940  Date:03/30/23  Time:5:02 PM   Provider Performing:Pete Zachery Hermes Burna Carrier   Procedure: Insertion of Non-tunneled Central Venous Catheter(36556)with US  guidance (40981)    Indication(s) Hemodialysis  Consent Risks of the procedure as well as the alternatives and risks of each were explained to the patient and/or caregiver.  Consent for the procedure was obtained and is signed in the bedside chart  Anesthesia Topical only with 1% lidocaine    Timeout Verified patient identification, verified procedure, site/side was marked, verified correct patient position, special equipment/implants available, medications/allergies/relevant history reviewed, required imaging and test results available.  Sterile Technique Maximal sterile technique including full sterile barrier drape, hand hygiene, sterile gown, sterile gloves, mask, hair covering, sterile ultrasound probe cover (if used).  Procedure Description Area of catheter insertion was cleaned with chlorhexidine  and draped in sterile fashion.   With real-time ultrasound guidance a HD catheter was placed into the right internal jugular vein.  Nonpulsatile blood flow and easy flushing noted in all ports.  The catheter was sutured in place and sterile dressing applied.  Complications/Tolerance None; patient tolerated the procedure well. Chest X-ray is ordered to verify placement for internal jugular or subclavian cannulation.  Chest x-ray is not ordered for femoral cannulation.  EBL Minimal  Specimen(s) None

## 2023-03-30 NOTE — Consult Note (Signed)
 [facility]       Reason for Consult: Empyema    Referring Physician: Dr. Arita Belch  Principal Problem:   Acute respiratory failure with hypoxia [facility]) Active Problems:   Acute systolic heart failure (HCC)   Thyroid  lesion   Pleural effusion   Anemia of chronic disease   Alcohol  abuse   CAP (community acquired pneumonia)   Urine retention   Hypotension   Acute kidney injury superimposed on stage 4 chronic kidney disease (HCC)   Empyema of right pleural space (HCC)   Staph aureus infection   Need for management of chest tube   Atrial fibrillation with RVR (HCC)   Oliguria    aspirin   81 mg Oral Daily   atorvastatin   40 mg Oral Daily   folic acid   1 mg Oral Daily   multivitamin with minerals  1 tablet Oral Daily   sodium chloride  flush  10 mL Intrapleural Q8H   thiamine   100 mg Oral Daily    Recommendations: Continue with linezolid  and ceftriaxone  Will monitor blood cultures Agree with lumbar MRI   Assessment: He has an empyema with Staph aureus and on appropriate treatment.  He has a remote history of MSSA bacteremia though was more than 10 years ago.  Will continue to monitor blood cultures.  HPI: Mic Butsch is a 83 y.o. male with a history of atrial fibrillation status post ablation, chronic kidney disease, history of dress syndrome PAF, diabetes, hypertension comes in here with progressively worsening shortness of breath over the last 2 months.  He has noted progressive worsening lower extremity edema and chest discomfort with exertion.  He had noted productive cough with clear sputum.  CT scan of the chest noted a large right pleural effusion and chest tube has been placed by pulmonary with a culture growth of Staph aureus.  Has had back pain.  Blood culture sent though had already started ceftriaxone .  He has a history of MSSA bacteremia in 2014.  Review of Systems:  Constitutional: negative for fevers and chills All other systems  reviewed and are negative    Past Medical History:  Diagnosis Date   Acute renal failure (ARF) (HCC) 05/31/2019   CAD (coronary artery disease) 2014   60-70% calcified LM lesion, high grade OM lesion, treated medically.   DM (diabetes mellitus) (HCC)    no longer on medications   Gout    HTN (hypertension)    Nonrheumatic aortic valve insufficiency 07/19/2019   PAF (paroxysmal atrial fibrillation) (HCC) 06/03/2019   during admission for DRESS    Social History   Tobacco Use   Smoking status: Former   Smokeless tobacco: Current   Tobacco comments:    quit smoking 50 yrs ago, now chew tobacco  Vaping Use   Vaping status: Never Used  Substance Use Topics   Alcohol  use: Yes    Comment: socially   Drug use: Never    Family History  Problem Relation Age of Onset   Osteoporosis Mother        passed away at age 75   Heart attack Father        died at age 76 from massive MI    Allergies  Allergen Reactions   Allopurinol     Possible DRESS syndrome   Colchicine     Possible DRESS syndrome    Physical Exam: Constitutional: alert, nad Vitals:   03/30/23 1135 03/30/23 1505  BP: (!) 80/53 (!) 71/49  Pulse: 97 (!)  102  Resp: 20   Temp: 98.1 F (36.7 C)   SpO2: 99% 100%   EYES: anicteric Respiratory: normal respiratory effort GI: soft Musculoskeletal: no edema  Lab Results  Component Value Date   WBC 11.2 (H) 03/30/2023   HGB 10.4 (L) 03/30/2023   HCT 32.6 (L) 03/30/2023   MCV 101.6 (H) 03/30/2023   PLT 135 (L) 03/30/2023    Lab Results  Component Value Date   CREATININE 3.79 (H) 03/30/2023   BUN 59 (H) 03/30/2023   NA 134 (L) 03/30/2023   K 4.9 03/30/2023   CL 97 (L) 03/30/2023   CO2 24 03/30/2023    Lab Results  Component Value Date   ALT 23 03/30/2023   AST 24 03/30/2023   ALKPHOS 68 03/30/2023     Microbiology: Recent Results (from the past 240 hours)  Body fluid culture w Gram Stain     Status: None (Preliminary result)   Collection Time:  03/29/23  4:24 PM   Specimen: Pleura; Body Fluid  Result Value Ref Range Status   Specimen Description PLEURAL  Final   Special Requests RIGHT  Final   Gram Stain   Final    FEW WBC PRESENT, PREDOMINANTLY PMN RARE GRAM POSITIVE COCCI CRITICAL RESULT CALLED TO, READ BACK BY AND VERIFIED WITH: RN MANDY BURNETTE ON 03/29/23 @ 1958 BY DRT    Culture   Final    MODERATE STAPHYLOCOCCUS AUREUS SUSCEPTIBILITIES TO FOLLOW Performed at [facility], [address] [place], [place] [address]    Report Status PENDING  Incomplete    Lina Render, MD [facility] [email or web address] 03/30/2023, 3:23 PM

## 2023-03-30 NOTE — Assessment & Plan Note (Signed)
>>  ASSESSMENT AND PLAN FOR ACUTE RESPIRATORY FAILURE WITH HYPOXIA (HCC) WRITTEN ON 03/30/2023  3:12 PM BY GOMES, ADRIANA, DO  Increased work of breathing, on 2L LFNC saturating at 100%, was on RA during his Echo this AM.  S/p IV Lasix 80 mg x 1.  Chest tube placed by PCCM to help improve WOB.  Echo demonstrated LVEF 50-55%, moderate LVH, RV mildly enlarged, LA and RA mildly dilated, and trivial MVR.  - Pulmonology consulted, appreciate recommendations - PT/OT to treat - Supplemental O2 - Re-dose IV Lasix 80 mg

## 2023-03-30 NOTE — Progress Notes (Signed)
 PHARMACY - ANTICOAGULATION  Pharmacy Consult for heparin  Indication: atrial fibrillation Brief A/P: Heparin  level within goal range Continue Heparin  at current rate   Allergies  Allergen Reactions   Allopurinol     Possible DRESS syndrome   Colchicine     Possible DRESS syndrome    Patient Measurements: Height: 6\' 1"  (185.4 cm) Weight: 94.5 kg (208 lb 5.4 oz) IBW/kg (Calculated) : 79.9 Heparin  Dosing Weight: 95 kg  Vital Signs: Temp: 97.9 F (36.6 C) (01/15 0200) Temp Source: Oral (01/15 0200) BP: 82/56 (01/15 0200) Pulse Rate: 100 (01/15 0200)  Labs: Recent Labs    03/29/23 0903 03/29/23 1017 03/29/23 1130 03/29/23 1832 03/30/23 0103  HGB 10.9* 11.6*  --   --   --   HCT 33.3* 34.0*  --   --   --   PLT 147*  --   --   --   --   LABPROT  --   --   --  17.4*  --   INR  --   --   --  1.4*  --   HEPARINUNFRC  --   --   --   --  0.34  CREATININE 2.83* 2.80*  --   --   --   TROPONINIHS 23*  --  23*  --   --     Estimated Creatinine Clearance: 23.4 mL/min (A) (by C-G formula based on SCr of 2.8 mg/dL (H)).  Assessment:  83 y.o. male with Afib for heparin   Goal of Therapy:  Heparin  level 0.3-0.7 units/ml Monitor platelets by anticoagulation protocol: Yes   Plan:  No change to heparin   Claudine Cullens, PharmD, BCPS

## 2023-03-30 NOTE — Assessment & Plan Note (Addendum)
 Increased work of breathing, on 2L LFNC saturating at 100%, was on RA during his Echo this AM.  S/p IV Lasix 80 mg x 1.  Chest tube placed by PCCM to help improve WOB.  Echo demonstrated LVEF 50-55%, moderate LVH, RV mildly enlarged, LA and RA mildly dilated, and trivial MVR.  - Pulmonology consulted, appreciate recommendations - PT/OT to treat - Supplemental O2 - Re-dose IV Lasix 80 mg

## 2023-03-30 NOTE — TOC Initial Note (Signed)
 Transition of Care [facility]) - Initial/Assessment Note    Patient Details  Name: Zachary Parrish MRN: [id number] Date of Birth: 1940/09/30  Transition of [facility]) CM/SW Contact:    Jennett Model, RN Phone Number: 03/30/2023, 1:19 PM  Clinical Narrative:                 Per Wife at the bedside, states he is From home with spouse, has PCP Dr.  Matha Solo, and insurance on file, states has no HH services in place at this time, has w/chair, walker, quad cane and regular cane at home.  States family member will transport him  home at [employer] and family is support system, states gets medications from [facility] in [place].  Pta self ambulatory with walker.   Expected Discharge Plan: Home/Self Care Barriers to Discharge: Continued Medical Work up   Patient Goals and CMS Choice Patient states their goals for this hospitalization and ongoing recovery are:: return home   Choice offered to / list presented to : NA      Expected Discharge Plan and Services In-house Referral: NA Discharge Planning Services: CM Consult Post Acute Care Choice: NA Living arrangements for the past 2 months: Single Family Home                 DME Arranged: N/A DME Agency: NA       HH Arranged: NA          Prior Living Arrangements/Services Living arrangements for the past 2 months: Single Family Home Lives with:: Spouse Patient language and need for interpreter reviewed:: Yes Do you feel safe going back to the place where you live?: Yes      Need for Family Participation in Patient Care: Yes (Comment) Care giver support system in place?: Yes (comment) Current home services: DME (w/chair, walker, quad cane, regular cane) Criminal Activity/Legal Involvement Pertinent to Current Situation/Hospitalization: No - Comment as needed  Activities of Daily Living   ADL Screening (condition at time of admission) Independently performs ADLs?: Yes (appropriate for developmental age) Is the patient deaf  or have difficulty hearing?: No Does the patient have difficulty seeing, even when wearing glasses/contacts?: No Does the patient have difficulty concentrating, remembering, or making decisions?: No  Permission Sought/Granted Permission sought to share information with : Case Manager Permission granted to share information with : Yes, Verbal Permission Granted              Emotional Assessment Appearance:: Appears stated age     Orientation: : Oriented to Self, Oriented to Place, Oriented to  Time, Oriented to Situation Alcohol  / Substance Use: Not Applicable Psych Involvement: No (comment)  Admission diagnosis:  Pleural effusion [J90] Acute respiratory failure with hypoxia (HCC) [J96.01] Atrial fibrillation with RVR (HCC) [I48.91] Acute hypoxic respiratory failure (HCC) [J96.01] Patient Active Problem List   Diagnosis Date Noted   Acute hypoxic respiratory failure (HCC) 03/29/2023   Thyroid  lesion 03/29/2023   Pleural effusion 03/29/2023   Anemia of chronic disease 03/29/2023   Alcohol  abuse 03/29/2023   CAP (community acquired pneumonia) 03/29/2023   Urine retention 03/29/2023   Hypotension 03/29/2023   CKD (chronic kidney disease) 03/29/2023   Acute respiratory failure with hypoxia (HCC) 03/29/2023   Bilateral carotid artery stenosis 05/31/2021   Dyslipidemia 11/22/2019   Coronary artery disease involving native coronary artery of native heart without angina pectoris 07/19/2019   Nonrheumatic aortic valve insufficiency 07/19/2019   Essential hypertension 07/19/2019   Stage 3a chronic kidney disease (HCC) 07/19/2019  Type 2 diabetes mellitus with complication, without long-term current use of insulin  (HCC) 07/19/2019   Educated about COVID-19 virus infection 07/19/2019   Atrial fibrillation with RVR (HCC)    Shock (HCC) 05/31/2019   Acute renal failure (ARF) (HCC) 05/31/2019   Abnormal transaminases 05/31/2019   Rash 05/31/2019   PCP:  Radiontchenko, Alexei,  MD Pharmacy:   [facility] [address] [place] [address] Phone: [phone] Fax: [phone]  [id number] [place] [place] - [address] [place] [address] Phone: [phone] Fax: [phone]  [id number] - [place],  - [address] AT [facility] OF [address] [place] [address] Phone: [phone] Fax: [phone]     Social Drivers of Health (SDOH) Social History: SDOH Screenings   Food Insecurity: No Food Insecurity (03/30/2023)  Housing: Low Risk  (03/30/2023)  Transportation Needs: No Transportation Needs (03/30/2023)  Utilities: Not At Risk (03/30/2023)  Financial Resource Strain: Low Risk  (01/24/2023)   Received from [facility]  Physical Activity: Insufficiently Active (01/24/2023)   Received from [facility]  Social Connections: Socially Integrated (03/30/2023)  Stress: No Stress Concern Present (01/24/2023)   Received from [facility]  Tobacco Use: High Risk (03/29/2023)   SDOH Interventions:     Readmission Risk Interventions     No data to display

## 2023-03-30 NOTE — Consult Note (Signed)
 [facility] Nephrology Consultation Note  Requesting MD: Dr. Arita Belch, Ena Harries Reason for consult: AKI  HPI:  Zachary Parrish is a 83 y.o. male with past medical history significant for hypertension, type II DM, CAD, A-fib, CKD was admitted with shortness of breath, seen as a consultation for the evaluation and management of acute kidney injury. The patient presented with worsening shortness of breath for few days associated with increased work of breathing.  He was hypoxic requiring oxygen supplement.  He was found to have large right pleural effusion therefore initially treated with IV diuretics.  He underwent chest tube placement on the right side with drainage of 1400 cc overnight.  Also seen by cardiologist and treating with IV diuretics for CHF exacerbation without much improvement.  He is blood pressure running low to 70s therefore he was transferred to ICU for pressors and further management. For CKD his baseline creatinine level seems to be around 2.5-2.9 recently.  He follows with Dr. Higinio Love at [facility].  On admission the creatinine level was 2.8 however it increased to 3.79 today.  Urine output only 100 cc with IV Lasix  80 mg.  Repeated the bladder scan this afternoon with no urine in the bladder.  The kidney ultrasound showed no hydronephrosis, mild left cortical thinning but right kidney is unremarkable. The blood culture showed a staph bacteremia therefore infectious disease was consulted.  The patient is currently on ceftriaxone  and linezolid .  The hospital course complicated by persistent hypotension, A-fib with RVR. He is now moved to ICU.  Systolic blood pressure in 70s initially.  Levophed  started with gradual improvement of blood pressure.  He has shortness of breath but denies chest pain.   His daughter-in-law is a Engineer, civil (consulting) and ICU/2H. He was accompanied by his wife and daughter-in-law and ICU nurses. PMHx:   Past Medical History:  Diagnosis Date    Acute renal failure (ARF) (HCC) 05/31/2019   CAD (coronary artery disease) 2014   60-70% calcified LM lesion, high grade OM lesion, treated medically.   DM (diabetes mellitus) (HCC)    no longer on medications   Gout    HTN (hypertension)    Nonrheumatic aortic valve insufficiency 07/19/2019   PAF (paroxysmal atrial fibrillation) (HCC) 06/03/2019   during admission for DRESS    Past Surgical History:  Procedure Laterality Date   CARDIAC CATHETERIZATION  2014   CARDIOVERSION N/A 06/07/2019   Procedure: CARDIOVERSION;  Surgeon: Wendie Hamburg, MD;  Location: [facility] ENDOSCOPY;  Service: Cardiovascular;  Laterality: N/A;   TEE WITHOUT CARDIOVERSION N/A 06/07/2019   Procedure: TRANSESOPHAGEAL ECHOCARDIOGRAM (TEE);  Surgeon: Wendie Hamburg, MD;  Location: [facility] ENDOSCOPY;  Service: Cardiovascular;  Laterality: N/A;    Family Hx:  Family History  Problem Relation Age of Onset   Osteoporosis Mother        passed away at age 29   Heart attack Father        died at age 62 from massive MI    Social History:  reports that he has quit smoking. He uses smokeless tobacco. He reports current alcohol  use. He reports that he does not use drugs.  Allergies:  Allergies  Allergen Reactions   Allopurinol     Possible DRESS syndrome   Colchicine     Possible DRESS syndrome    Medications: Prior to Admission medications   Medication Sig Start Date End Date Taking? Authorizing Provider  aspirin  81 MG chewable tablet Chew 162 mg by mouth daily.   Yes [provider]  atorvastatin  (LIPITOR) 40 MG tablet Take 40 mg by mouth daily. 03/27/19  Yes [provider]  carvedilol  (COREG ) 25 MG tablet Take 1 tablet (25 mg total) by mouth 2 (two) times daily. Hold dose if heart rate is less than 50 06/07/19  Yes Kyle, Tyrone A, DO  Coenzyme Q10 300 MG CAPS Take 1 capsule by mouth daily.   Yes [provider]  Cyanocobalamin  (B-12 PO) Take by mouth.   Yes [provider]  furosemide  (LASIX ) 40 MG tablet Take 40 mg by mouth daily.    Yes [provider]  Multiple Vitamins-Minerals (OCUVITE PO) Take 1 capsule by mouth at bedtime.   Yes [provider]  Omega-3 Fatty Acids (FISH OIL) 1000 MG CAPS Take 2,000 capsules by mouth 2 (two) times daily.   Yes [provider]  potassium chloride (MICRO-K) 10 MEQ CR capsule Take 20 mEq by mouth 2 (two) times daily.   Yes [provider]  telmisartan (MICARDIS) 40 MG tablet Take 40 mg by mouth daily.   Yes [provider]    I have reviewed the patient's current medications.  Labs: Renal Panel: Recent Labs  Lab 03/29/23 0903 03/29/23 0928 03/29/23 1017 03/30/23 0251  NA 136  --  136 134*  K 4.8  --  4.7 4.9  CL 97*  --  97* 97*  CO2 26  --   --  24  GLUCOSE 155*  --  156* 138*  BUN 48*  --  48* 59*  CREATININE 2.83*  --  2.80* 3.79*  CALCIUM  8.8*  --   --  8.3*  MG  --  2.1  --  2.2     CBC:    Latest Ref Rng & Units 03/30/2023    2:51 AM 03/29/2023   10:17 AM 03/29/2023    9:03 AM  CBC  WBC 4.0 - 10.5 K/uL 11.2   14.0   Hemoglobin 13.0 - 17.0 g/dL 78.2  95.6  21.3   Hematocrit 39.0 - 52.0 % 32.6  34.0  33.3   Platelets 150 - 400 K/uL 135   147      Anemia Panel:  Recent Labs    03/29/23 0903 03/29/23 1017 03/30/23 0251  HGB 10.9* 11.6* 10.4*  MCV 100.3*  --  101.6*    Recent Labs  Lab 03/29/23 0903 03/29/23 1832 03/30/23 0251  AST 28  --  24  ALT 24  --  23  ALKPHOS 84  --  68  BILITOT 1.9*  --  1.3*  PROT 6.8 6.2* 6.5  ALBUMIN  2.8*  --  2.7*    Lab Results  Component Value Date   HGBA1C 6.6 (H) 03/29/2023    ROS:  Pertinent items noted in HPI and remainder of comprehensive ROS otherwise negative.  Physical Exam: Vitals:   03/30/23 1135 03/30/23 1505  BP: (!) 80/53 (!) 71/49  Pulse: 97 (!) 102  Resp: 20   Temp: 98.1 F (36.7 C)   SpO2: 99% 100%     General exam: Ill looking male, alert awake Respiratory  system: Right chest tube, mild increased work of breathing, distant breath sound. Cardiovascular system: S1 & S2 heard, RRR. Gastrointestinal system: Abdomen is nondistended, soft and nontender.  Central nervous system: Alert and awake Extremities: b/l leg pitting edema++ Skin: No rashes, lesions or ulcers Psychiatry: Looks tired, alert and awake.  Assessment/Plan:  # Acute kidney injury on CKD IV b/l cr around 2.5-2.9: AKI  likely ischemic ATN due to septic shock.  Kidney ultrasound with no hydronephrosis.  No urine output with IV Lasix  and remains hypotensive therefore moved to ICU.  Exam consistent with volume overload. We will plan to initiate CRRT after placement of HD catheter.  Hopefully the blood pressure will improve after initiation of Levophed .  UF as tolerated to optimize volume. All 4K bath, heparin  for anticoagulation. I have discussed about CRRT/HD cath with the patient, his wife and daughter-in-law in ICU.  Also explained about increased bleeding risk associated with with anticoagulation which is needed in CRRT.  # Septic shock/staph bacteremia: Starting Levophed .  Already on ceftriaxone  and linezolid  and ID has been consulted.  # Right pleural effusion status post right chest tube placement and drainage of fluid.  Plan per PCCM.  # Peripheral edema/fluid overload: Did not respond with IV diuretics and unable to use further diuresis because of persistent hypotension.  Starting CRRT as discussed above.  # Acute hypoxic respiratory failure # Acute CHF # A-fib with RVR  Discussed with the multiple team including ICU provider, patient's wife, daughter-in-law and nursing staffs at [facility].  Thank you for the consult.  We will continue to follow.  Errika Narvaiz Lansing Planas 03/30/2023, 3:26 PM  [facility].

## 2023-03-30 NOTE — Significant Event (Signed)
 Rapid Response Event Note   Reason for Call :  hypotension  Initial Focused Assessment:  Patient is drowsy and lying on his right side.  He will wake and answer questions and follow commands.   He is warm and dry. 2+ edema  BP 71/49  HR 102  RR 18  O2 sat 94% on 2L [place].  Chest tube is currently clamped after instillation of Alteplace by NP about 1420  Wife at bedside   Interventions:  Levophed  gtt started per orders and titrated   Transported to 2H16  Unclamped CT after arrival to ICU  Plan of Care:     Event Summary:   MD Notified: Prior to my arrival Call Time: 1453 Arrival Time: 1457 End Time:  1545  Waldemar Guillaume, RN

## 2023-03-31 ENCOUNTER — Inpatient Hospital Stay (HOSPITAL_COMMUNITY): Payer: Medicare Other

## 2023-03-31 DIAGNOSIS — B9561 Methicillin susceptible Staphylococcus aureus infection as the cause of diseases classified elsewhere: Secondary | ICD-10-CM

## 2023-03-31 DIAGNOSIS — J869 Pyothorax without fistula: Secondary | ICD-10-CM | POA: Diagnosis not present

## 2023-03-31 DIAGNOSIS — N184 Chronic kidney disease, stage 4 (severe): Secondary | ICD-10-CM

## 2023-03-31 DIAGNOSIS — N179 Acute kidney failure, unspecified: Secondary | ICD-10-CM | POA: Diagnosis not present

## 2023-03-31 DIAGNOSIS — E440 Moderate protein-calorie malnutrition: Secondary | ICD-10-CM | POA: Insufficient documentation

## 2023-03-31 DIAGNOSIS — J9601 Acute respiratory failure with hypoxia: Secondary | ICD-10-CM | POA: Diagnosis not present

## 2023-03-31 LAB — BODY FLUID CULTURE W GRAM STAIN

## 2023-03-31 LAB — RENAL FUNCTION PANEL
Albumin: 2.5 g/dL — ABNORMAL LOW (ref 3.5–5.0)
Albumin: 2.5 g/dL — ABNORMAL LOW (ref 3.5–5.0)
Anion gap: 9 (ref 5–15)
Anion gap: 8 (ref 5–15)
BUN: 45 mg/dL — ABNORMAL HIGH (ref 8–23)
BUN: 26 mg/dL — ABNORMAL HIGH (ref 8–23)
CO2: 25 mmol/L (ref 22–32)
CO2: 26 mmol/L (ref 22–32)
Calcium: 7.7 mg/dL — ABNORMAL LOW (ref 8.9–10.3)
Calcium: 8.3 mg/dL — ABNORMAL LOW (ref 8.9–10.3)
Chloride: 98 mmol/L (ref 98–111)
Chloride: 99 mmol/L (ref 98–111)
Creatinine, Ser: 3.32 mg/dL — ABNORMAL HIGH (ref 0.61–1.24)
Creatinine, Ser: 2.51 mg/dL — ABNORMAL HIGH (ref 0.61–1.24)
GFR, Estimated: 18 mL/min — ABNORMAL LOW (ref >60)
GFR, Estimated: 25 mL/min — ABNORMAL LOW (ref >60)
Glucose, Bld: 176 mg/dL — ABNORMAL HIGH (ref 70–99)
Glucose, Bld: 136 mg/dL — ABNORMAL HIGH (ref 70–99)
Phosphorus: 3.8 mg/dL (ref 2.5–4.6)
Phosphorus: 2.7 mg/dL (ref 2.5–4.6)
Potassium: 4.8 mmol/L (ref 3.5–5.1)
Potassium: 5.2 mmol/L — ABNORMAL HIGH (ref 3.5–5.1)
Sodium: 132 mmol/L — ABNORMAL LOW (ref 135–145)
Sodium: 133 mmol/L — ABNORMAL LOW (ref 135–145)

## 2023-03-31 LAB — GLUCOSE, CAPILLARY
Glucose-Capillary: 139 mg/dL — ABNORMAL HIGH (ref 70–99)
Glucose-Capillary: 75 mg/dL (ref 70–99)
Glucose-Capillary: 155 mg/dL — ABNORMAL HIGH (ref 70–99)

## 2023-03-31 LAB — CBC
HCT: 31.5 % — ABNORMAL LOW (ref 39.0–52.0)
Hemoglobin: 10 g/dL — ABNORMAL LOW (ref 13.0–17.0)
MCH: 32.4 pg (ref 26.0–34.0)
MCHC: 31.7 g/dL (ref 30.0–36.0)
MCV: 101.9 fL — ABNORMAL HIGH (ref 80.0–100.0)
Platelets: 182 10*3/uL (ref 150–400)
RBC: 3.09 MIL/uL — ABNORMAL LOW (ref 4.22–5.81)
RDW: 17.3 % — ABNORMAL HIGH (ref 11.5–15.5)
WBC: 11.5 10*3/uL — ABNORMAL HIGH (ref 4.0–10.5)
nRBC: 0 % (ref 0.0–0.2)

## 2023-03-31 LAB — APTT: aPTT: 39 s — ABNORMAL HIGH (ref 24–36)

## 2023-03-31 LAB — CYTOLOGY - NON PAP

## 2023-03-31 LAB — MAGNESIUM: Magnesium: 2.2 mg/dL (ref 1.7–2.4)

## 2023-03-31 LAB — RHEUMATOID FACTOR: Rheumatoid fact SerPl-aCnc: 20.2 [IU]/mL — ABNORMAL HIGH (ref <14.0)

## 2023-03-31 MED ORDER — CEFAZOLIN SODIUM-DEXTROSE 1-4 GM/50ML-% IV SOLN
1.0000 g | Freq: Two times a day (BID) | INTRAVENOUS | Status: DC
  Filled 2023-03-31: qty 50

## 2023-03-31 MED ORDER — RENA-VITE PO TABS
1.0000 | ORAL_TABLET | Freq: Two times a day (BID) | ORAL | Status: DC
  Administered 2023-03-31 – 2023-04-17 (×33): 1 via ORAL
  Filled 2023-03-31 (×34): qty 1

## 2023-03-31 MED ORDER — STERILE WATER FOR INJECTION IJ SOLN
5.0000 mg | Freq: Once | RESPIRATORY_TRACT | Status: AC
  Administered 2023-03-31: 5 mg via INTRAPLEURAL
  Filled 2023-03-31: qty 5

## 2023-03-31 MED ORDER — PROSOURCE PLUS PO LIQD
30.0000 mL | Freq: Two times a day (BID) | ORAL | Status: DC
  Administered 2023-03-31: 30 mL via ORAL
  Filled 2023-03-31 (×2): qty 30

## 2023-03-31 MED ORDER — INSULIN ASPART 100 UNIT/ML IJ SOLN
0.0000 [IU] | Freq: Three times a day (TID) | INTRAMUSCULAR | Status: DC
Start: 2023-03-31 — End: 2023-04-17
  Administered 2023-03-31: 1 [IU] via SUBCUTANEOUS
  Administered 2023-04-02 – 2023-04-03 (×6): 2 [IU] via SUBCUTANEOUS
  Administered 2023-04-04 (×2): 3 [IU] via SUBCUTANEOUS
  Administered 2023-04-04 – 2023-04-05 (×2): 2 [IU] via SUBCUTANEOUS
  Administered 2023-04-05: 3 [IU] via SUBCUTANEOUS
  Administered 2023-04-05: 2 [IU] via SUBCUTANEOUS
  Administered 2023-04-06: 3 [IU] via SUBCUTANEOUS
  Administered 2023-04-06 (×2): 2 [IU] via SUBCUTANEOUS
  Administered 2023-04-07 (×3): 3 [IU] via SUBCUTANEOUS
  Administered 2023-04-08 (×2): 2 [IU] via SUBCUTANEOUS
  Administered 2023-04-09: 3 [IU] via SUBCUTANEOUS
  Administered 2023-04-09: 2 [IU] via SUBCUTANEOUS
  Administered 2023-04-09 – 2023-04-10 (×2): 3 [IU] via SUBCUTANEOUS
  Administered 2023-04-10: 2 [IU] via SUBCUTANEOUS
  Administered 2023-04-10: 3 [IU] via SUBCUTANEOUS
  Administered 2023-04-11 (×2): 2 [IU] via SUBCUTANEOUS
  Administered 2023-04-11: 1 [IU] via SUBCUTANEOUS
  Administered 2023-04-12: 3 [IU] via SUBCUTANEOUS
  Administered 2023-04-12: 1 [IU] via SUBCUTANEOUS
  Administered 2023-04-12 – 2023-04-13 (×2): 2 [IU] via SUBCUTANEOUS
  Administered 2023-04-13 – 2023-04-14 (×3): 3 [IU] via SUBCUTANEOUS
  Administered 2023-04-14 – 2023-04-16 (×5): 2 [IU] via SUBCUTANEOUS
  Administered 2023-04-16: 1 [IU] via SUBCUTANEOUS
  Administered 2023-04-16: 2 [IU] via SUBCUTANEOUS

## 2023-03-31 MED ORDER — SODIUM CHLORIDE 0.9% FLUSH
10.0000 mL | Freq: Three times a day (TID) | INTRAVENOUS | Status: DC
  Administered 2023-03-31 – 2023-04-04 (×13): 10 mL via INTRAPLEURAL

## 2023-03-31 MED ORDER — PHENOBARBITAL 32.4 MG PO TABS
32.4000 mg | ORAL_TABLET | Freq: Three times a day (TID) | ORAL | Status: DC
  Administered 2023-04-02 (×2): 32.4 mg via ORAL
  Filled 2023-03-31 (×2): qty 1

## 2023-03-31 MED ORDER — FOLIC ACID 1 MG PO TABS
1.0000 mg | ORAL_TABLET | Freq: Every day | ORAL | Status: DC
  Filled 2023-03-31: qty 1

## 2023-03-31 MED ORDER — LORAZEPAM 2 MG/ML IJ SOLN
1.0000 mg | INTRAMUSCULAR | Status: DC | PRN
  Filled 2023-03-31: qty 1

## 2023-03-31 MED ORDER — THIAMINE HCL 100 MG/ML IJ SOLN
100.0000 mg | Freq: Every day | INTRAMUSCULAR | Status: DC
  Administered 2023-04-01: 100 mg via INTRAVENOUS
  Filled 2023-03-31 (×2): qty 2

## 2023-03-31 MED ORDER — MIDAZOLAM HCL 2 MG/2ML IJ SOLN
2.0000 mg | INTRAMUSCULAR | Status: AC | PRN
  Administered 2023-03-31: 2 mg via INTRAVENOUS
  Filled 2023-03-31: qty 2

## 2023-03-31 MED ORDER — THIAMINE MONONITRATE 100 MG PO TABS
100.0000 mg | ORAL_TABLET | Freq: Every day | ORAL | Status: DC
  Administered 2023-04-02 – 2023-04-05 (×4): 100 mg via ORAL
  Filled 2023-03-31 (×4): qty 1

## 2023-03-31 MED ORDER — PHENOBARBITAL 32.4 MG PO TABS
64.8000 mg | ORAL_TABLET | Freq: Three times a day (TID) | ORAL | Status: DC
  Administered 2023-03-31 – 2023-04-02 (×4): 64.8 mg via ORAL
  Filled 2023-03-31 (×4): qty 2

## 2023-03-31 MED ORDER — LORAZEPAM 2 MG/ML IJ SOLN: 1.0000 mg | INTRAMUSCULAR | Status: DC | PRN

## 2023-03-31 MED ORDER — SODIUM CHLORIDE (PF) 0.9 % IJ SOLN
10.0000 mg | Freq: Once | INTRAMUSCULAR | Status: AC
  Administered 2023-03-31: 10 mg via INTRAPLEURAL
  Filled 2023-03-31: qty 10

## 2023-03-31 MED ORDER — LORAZEPAM 1 MG PO TABS
1.0000 mg | ORAL_TABLET | ORAL | Status: AC | PRN
  Administered 2023-04-01 – 2023-04-03 (×3): 1 mg via ORAL
  Filled 2023-03-31 (×4): qty 1

## 2023-03-31 MED ORDER — INSULIN ASPART 100 UNIT/ML IJ SOLN
0.0000 [IU] | Freq: Every day | INTRAMUSCULAR | Status: DC
Start: 2023-03-31 — End: 2023-04-17
  Administered 2023-04-06 – 2023-04-08 (×2): 2 [IU] via SUBCUTANEOUS

## 2023-03-31 MED ORDER — ENSURE ENLIVE PO LIQD
237.0000 mL | Freq: Three times a day (TID) | ORAL | Status: DC
  Administered 2023-04-02 – 2023-04-11 (×21): 237 mL via ORAL
  Filled 2023-03-31 (×2): qty 237

## 2023-03-31 MED ORDER — LORAZEPAM 2 MG/ML IJ SOLN
1.0000 mg | INTRAMUSCULAR | Status: DC | PRN
  Administered 2023-03-31: 2 mg via INTRAVENOUS
  Filled 2023-03-31 (×2): qty 1

## 2023-03-31 MED ORDER — CEFAZOLIN SODIUM-DEXTROSE 2-4 GM/100ML-% IV SOLN
2.0000 g | Freq: Two times a day (BID) | INTRAVENOUS | Status: DC
  Administered 2023-03-31 – 2023-04-05 (×11): 2 g via INTRAVENOUS
  Filled 2023-03-31 (×12): qty 100

## 2023-03-31 MED ORDER — CALCIUM GLUCONATE-NACL 2-0.675 GM/100ML-% IV SOLN
2.0000 g | Freq: Once | INTRAVENOUS | Status: AC
  Administered 2023-03-31: 2000 mg via INTRAVENOUS
  Filled 2023-03-31: qty 100

## 2023-03-31 NOTE — Progress Notes (Signed)
eLink Physician-Brief Progress Note Patient Name: Zachary Parrish DOB: June 02, 1940 MRN: [id number]   Date of Service  03/31/2023  HPI/Events of Note   PRN order for Ativan is just 1mg  every 4 hours for agitation.  he is on  a phenobarbital taper.  CIWA 12 currently  eICU Interventions  Change ativan to 1-4 mg range     Intervention Category Intermediate Interventions: Medication change / dose adjustment  [name] 03/31/2023, 8:36 PM

## 2023-03-31 NOTE — Progress Notes (Addendum)
NAME:  Zachary Parrish, MRN:  [id number], DOB:  12-30-1940, LOS: 2 ADMISSION DATE:  03/29/2023, CONSULTATION DATE:  1/14 REFERRING MD:  Miquel Dunn, CHIEF COMPLAINT:  EFFUSION    History of Present Illness:  83 year old male who presents to the emergency room with chief complaint of approximately 2 and half months of progressive exertional dyspnea.  Initially noted shortness of breath back in early November primarily with activity, around Thanksgiving time reporting fairly significant increased work of breathing to the point that family noticed, of note about 1 month prior to that certain to notice some mild increase lower extremity edema.  By Christmas time his shortness of breath had gotten to the point of resting dyspnea, he was also transition to 4 pillow orthopnea and at times would actually go out and sleep in his car to be in the upright position.  He has had productive cough with clear mucus, no chest pain, no palpitations.  No sick exposures.  His legs have become more painful over the last week, and he presents to the emergency room primarily because he can only speak in 2-3 word phrases and was significantly weak and orthopneic.  In the ER Evaluation Was initiated this demonstrated the following: Serum creatinine 2.83 is not far from his baseline, glucose 155 white blood cell count 14 troponin I of 23, a chest x-ray was obtained this demonstrated a large right pleural effusion because of this a CT of chest was obtained to further evaluate.  CT of chest demonstrated a large right pleural effusion with pleural thickening and rind, some basilar consolidated atelectasis which appeared rounded in nature, and a very small left pleural effusion he also had a 2.5 cm cystic lesion on his right thyroid.  Because of the pleural effusion and dyspnea pulmonary was asked to evaluate.  Pertinent  Medical History  Atrial fibrillation not on anticoagulation but has had prior cardioversion, carotid stenosis, CKD stage IIIb,  hyperlipidemia, coronary artery disease, type 2 diabetes, hypertension  Significant Hospital Events: Including procedures, antibiotic start and stop dates in addition to other pertinent events   1/14 admitted with progressive subacute dyspnea and large chronic appearing pleural effusion with rind on the right.  Thoracostomy tube placed in the emergency room  Interim History / Subjective:  Stated feeling okay, shortness of breath is improved, denies chest pain, fever and chills On CRRT Remained afebrile Still on Levophed at 14 mics  Objective   Blood pressure (!) 81/70, pulse 98, temperature 98.2 F (36.8 C), temperature source Oral, resp. rate (!) 24, height 6\' 1"  (1.854 m), weight 96.3 kg, SpO2 91%.        Intake/Output Summary (Last 24 hours) at 03/31/2023 4403 Last data filed at 03/31/2023 0700 Gross per 24 hour  Intake 1221.29 ml  Output 1707.4 ml  Net -486.11 ml   Filed Weights   03/29/23 1200 03/30/23 0200 03/31/23 4742  Weight: 95.3 kg 94.5 kg 96.3 kg    Examination: General: Acute on chronically ill-appearing male, lying on the bed HEENT: [place]/AT, eyes anicteric.  moist mucus membranes Neuro: Alert, awake following commands, moving all 4 extremities Chest: Reduced air entry on right side, clear to auscultation on left side, no crackles or wheezes Heart: Regular rate and rhythm, no murmurs or gallops Abdomen: Soft, nontender, nondistended, bowel sounds present Skin: No rash  Labs and images reviewed Resolved Hospital Problem list     Assessment & Plan:  Acute hypoxic respiratory failure due to right-sided empyema Severe sepsis with septic shock due  to staph aureus empyema status post chest tube placement, not POA Small ex vacuo pneumothorax Acute right-sided systolic heart failure Chronic atrial fibrillation AKI on CKD stage IV due to cardiorenal syndrome versus septic ATN Thyroid lesion Anemia and thrombocytopenia of chronic disease Urinary  retention Hypervolemic hyponatremia/Hypocalcemia DM type 2  Continue to titrate nasal cannula oxygen with O2 sat goal 92% Right-sided chest tube in place, output was 660 cc bloody fluid Received TNK and dornase and chest tube yesterday Encourage incentive spirometry Still requiring vasopressor support, currently on Levophed at 14 mics Provided with MAP goal 65 Appreciate infectious disease follow-up, Zyvox and ceftriaxone discontinued, placed on cefazolin Patient will need MRI lumbar spine to rule out epidural abscess as he was complaining of back pain and now he has a staff aureus and pleural fluid Echocardiogram showed RV dysfunction and dilatation Continue volume removal via CRRT Remains in A-fib with controlled rate Off anticoagulation for now as patient is receiving TPN dornase via chest tube and he is having bloody output Continue CRRT Nephrology is following Outpatient follow-up follow-up thyroid lesion H&H and platelet counts are stable Closely monitor and supplement electrolytes Hemoglobin A1c 6.6 Continue sliding scale insulin with CBG goal 140-180   Best Practice (right click and "Reselect all SmartList Selections" daily)  Diet/type: Regular consistency DVT prophylaxis: SCD GI prophylaxis: N/A Lines: HD catheter, and yes and it is still needed Foley:  Yes, and it is still needed Code Status:  full code Last date of multidisciplinary goals of care discussion: 1/16: Patient and his wife were updated at bedside, decision was to continue full scope of care   The patient is critically ill due to septic shock due to right-sided empyema.  Critical care was necessary to treat or prevent imminent or life-threatening deterioration.  Critical care was time spent personally by me on the following activities: development of treatment plan with patient and/or surrogate as well as nursing, discussions with consultants, evaluation of patient's response to treatment, examination of  patient, obtaining history from patient or surrogate, ordering and performing treatments and interventions, ordering and review of laboratory studies, ordering and review of radiographic studies, pulse oximetry, re-evaluation of patient's condition and participation in multidisciplinary rounds.   During this encounter critical care time was devoted to patient care services described in this note for 35 minutes.     Cheri Fowler, MD [facility] See Amion for pager If no response to pager, please call [phone] until 7pm After 7pm, Please call E-link [phone]

## 2023-03-31 NOTE — Progress Notes (Signed)
[facility]   Reason for visit: Follow up on empyema  Interval History: Bacterial culture with MSSA, blood cultures remain no growth. On Levophed.  Physical Exam: Constitutional:  Vitals:   03/31/23 1045 03/31/23 1100  BP: 101/62 (!) 106/57  Pulse: (!) 120 (!) 113  Resp: 20 (!) 22  Temp:    SpO2: 100% 100%   patient appears in NAD Respiratory: Normal respiratory effort  Review of Systems: Constitutional: negative for fevers and chills  Lab Results  Component Value Date   WBC 11.5 (H) 03/31/2023   HGB 10.0 (L) 03/31/2023   HCT 31.5 (L) 03/31/2023   MCV 101.9 (H) 03/31/2023   PLT 182 03/31/2023    Lab Results  Component Value Date   CREATININE 3.32 (H) 03/31/2023   BUN 45 (H) 03/31/2023   NA 132 (L) 03/31/2023   K 4.8 03/31/2023   CL 98 03/31/2023   CO2 25 03/31/2023    Lab Results  Component Value Date   ALT 23 03/30/2023   AST 24 03/30/2023   ALKPHOS 68 03/30/2023     Microbiology: Recent Results (from the past 240 hours)  Body fluid culture w Gram Stain     Status: None   Collection Time: 03/29/23  4:24 PM   Specimen: Pleura; Body Fluid  Result Value Ref Range Status   Specimen Description PLEURAL  Final   Special Requests RIGHT  Final   Gram Stain   Final    FEW WBC PRESENT, PREDOMINANTLY PMN RARE GRAM POSITIVE COCCI CRITICAL RESULT CALLED TO, READ BACK BY AND VERIFIED WITH: RN MANDY BURNETTE ON 03/29/23 @ 1958 BY DRT Performed at [facility], [address], [place], [place] [address]    Culture MODERATE STAPHYLOCOCCUS AUREUS  Final   Report Status 03/31/2023 FINAL  Final   Organism ID, Bacteria STAPHYLOCOCCUS AUREUS  Final      Susceptibility   Staphylococcus aureus - MIC*    CIPROFLOXACIN <=0.5 SENSITIVE Sensitive     ERYTHROMYCIN <=0.25 SENSITIVE Sensitive     GENTAMICIN <=0.5 SENSITIVE Sensitive     OXACILLIN 0.5 SENSITIVE Sensitive     TETRACYCLINE <=1 SENSITIVE Sensitive     VANCOMYCIN 1 SENSITIVE  Sensitive     TRIMETH/SULFA <=10 SENSITIVE Sensitive     CLINDAMYCIN <=0.25 SENSITIVE Sensitive     RIFAMPIN <=0.5 SENSITIVE Sensitive     Inducible Clindamycin NEGATIVE Sensitive     LINEZOLID 2 SENSITIVE Sensitive     * MODERATE STAPHYLOCOCCUS AUREUS  Culture, blood (Routine X 2) w Reflex to ID Panel     Status: None (Preliminary result)   Collection Time: 03/30/23  1:02 PM   Specimen: BLOOD LEFT HAND  Result Value Ref Range Status   Specimen Description BLOOD LEFT HAND  Final   Special Requests   Final    BOTTLES DRAWN AEROBIC AND ANAEROBIC Blood Culture results may not be optimal due to an inadequate volume of blood received in culture bottles   Culture   Final    NO GROWTH < 24 HOURS Performed at [facility], [address], [place], [place] [address]    Report Status PENDING  Incomplete  Culture, blood (Routine X 2) w Reflex to ID Panel     Status: None (Preliminary result)   Collection Time: 03/30/23  1:02 PM   Specimen: BLOOD RIGHT HAND  Result Value Ref Range Status   Specimen Description BLOOD RIGHT HAND  Final   Special Requests  Final    BOTTLES DRAWN AEROBIC AND ANAEROBIC Blood Culture results may not be optimal due to an inadequate volume of blood received in culture bottles   Culture   Final    NO GROWTH < 24 HOURS Performed at [facility], [address] [place], [place] [address]    Report Status PENDING  Incomplete  MRSA Next Gen by PCR, Nasal     Status: None   Collection Time: 03/30/23  6:45 PM   Specimen: Nasal Mucosa; Nasal Swab  Result Value Ref Range Status   MRSA by PCR Next Gen NOT DETECTED NOT DETECTED Final    Comment: (NOTE) The GeneXpert MRSA Assay (FDA approved for NASAL specimens only), is one component of a comprehensive MRSA colonization surveillance program. It is not intended to diagnose MRSA infection nor to guide or monitor treatment for MRSA infections. Test performance is not FDA approved in patients less than 36  years old. Performed at [facility], [address] [place], [place] [address]     Impression/Plan:  1.  Empyema.  Overall fairly serosanguineous fluid with little purulence noted but positive culture for MSSA.  Will change him to cefazolin today and stop ceftriaxone and linezolid.  Will continue to monitor blood cultures.  2.  Back pain.  MRI ordered for concern for back infection with concomitant staph in his lungs.  3.  Shock.  No positive blood cultures but is on Levophed and central venous catheter placed.

## 2023-03-31 NOTE — Progress Notes (Signed)
[facility] NEPHROLOGY PROGRESS NOTE  Assessment/ Plan: Pt is a 83 y.o. yo male  with PMH significant for hypertension, type II DM, CAD, A-fib, CKD (f/b Dr. Glenna Fellows) was admitted with shortness of breath, seen as a consultation for the evaluation and management of acute kidney injury.   # Acute kidney injury on CKD IV b/l cr around 2.5-2.9: AKI likely ischemic ATN due to septic shock.  Kidney ultrasound with no hydronephrosis.  Refractory to diuretics and remained hypotensive therefore moved to ICU on 1/15. Initiated CRRT on 1/15 after placement of temporary HD catheter by PCCM.  Tolerating CRRT well with the help up Levophed and UF around 50 cc an hour.  He reports clinically feeling much better mainly he is breathing. Continue CRRT, all 4K bath, heparin through the syringes for anticoagulation. Strict ins and out and close lab monitoring.  # Septic shock/staph bacteremia: Starting Levophed.  Currently on ceftriaxone and linezolid and ID is following.  # Right pleural effusion status post right chest tube placement and drainage of fluid.  Plan per PCCM.   # Peripheral edema/fluid overload: Did not respond with IV diuretics and unable to use further diuresis because of persistent hypotension.  Started CRRT as discussed above.   # Acute hypoxic respiratory failure: On 2 L of oxygen. # Acute CHF # A-fib with RVR  Subjective: Seen and examined at the bedside.  Started CRRT yesterday evening and has been tolerating well on Levophed.  No urine output recorded.  He reports that his shortness of breath is much better.  No chest pain.  His wife accompanied him.  Discussed with ICU nurses. Objective Vital signs in last 24 hours: Vitals:   03/31/23 0820 03/31/23 0830 03/31/23 0845 03/31/23 0900  BP: 93/63 (!) 89/55 (!) 95/58 93/63  Pulse: (!) 109 97 (!) 104 (!) 105  Resp: (!) 21 17 (!) 21 (!) 21  Temp:      TempSrc:      SpO2: 100% 100% 100% 97%  Weight:      Height:        Weight change: 1.045 kg  Intake/Output Summary (Last 24 hours) at 03/31/2023 0902 Last data filed at 03/31/2023 0800 Gross per 24 hour  Intake 1333.66 ml  Output 1847.7 ml  Net -514.04 ml       Labs: RENAL PANEL Recent Labs  Lab 03/29/23 0903 03/29/23 0928 03/29/23 1017 03/30/23 0251 03/31/23 0241  NA 136  --  136 134* 132*  K 4.8  --  4.7 4.9 4.8  CL 97*  --  97* 97* 98  CO2 26  --   --  24 25  GLUCOSE 155*  --  156* 138* 176*  BUN 48*  --  48* 59* 45*  CREATININE 2.83*  --  2.80* 3.79* 3.32*  CALCIUM 8.8*  --   --  8.3* 7.7*  MG  --  2.1  --  2.2 2.2  PHOS  --   --   --   --  3.8  ALBUMIN 2.8*  --   --  2.7* 2.5*    Liver Function Tests: Recent Labs  Lab 03/29/23 0903 03/29/23 1832 03/30/23 0251 03/31/23 0241  AST 28  --  24  --   ALT 24  --  23  --   ALKPHOS 84  --  68  --   BILITOT 1.9*  --  1.3*  --   PROT 6.8 6.2* 6.5  --   ALBUMIN 2.8*  --  2.7* 2.5*   No results for input(s): "LIPASE", "AMYLASE" in the last 168 hours. No results for input(s): "AMMONIA" in the last 168 hours. CBC: Recent Labs    03/29/23 0903 03/29/23 1017 03/30/23 0251 03/31/23 0241  HGB 10.9* 11.6* 10.4* 10.0*  MCV 100.3*  --  101.6* 101.9*    Cardiac Enzymes: No results for input(s): "CKTOTAL", "CKMB", "CKMBINDEX", "TROPONINI" in the last 168 hours. CBG: Recent Labs  Lab 03/30/23 2312  GLUCAP 203*    Iron Studies: No results for input(s): "IRON", "TIBC", "TRANSFERRIN", "FERRITIN" in the last 72 hours. Studies/Results: DG CHEST PORT 1 VIEW Result Date: 03/30/2023 CLINICAL DATA:  Line placement EXAM: PORTABLE CHEST 1 VIEW COMPARISON:  X-ray 03/29/2023 and older FINDINGS: New right IJ line with the tip along the central SVC above the right atrium. No pneumothorax. Enlarged cardiopericardial silhouette again seen with a large right pleural effusion and small left. Pigtail catheter in the right lung base. Associated right lung opacities. No pneumothorax. No edema.  Overlapping cardiac leads. Film is under penetrated IMPRESSION: New right IJ line in place.  No pneumothorax Electronically Signed   By: Karen Kays M.D.   On: 03/30/2023 18:12   ECHOCARDIOGRAM COMPLETE Result Date: 03/30/2023    ECHOCARDIOGRAM REPORT   Patient Name:   IZEAR IFFLAND Date of Exam: 03/30/2023 Medical Rec #:  [id number]    Height:       73.0 in Accession #:    [id number]   Weight:       208.3 lb Date of Birth:  [birth year]     BSA:          2.189 m Patient Age:    82 years     BP:           90/62 mmHg Patient Gender: M            HR:           105 bpm. Exam Location:  Inpatient Procedure: 2D Echo, Cardiac Doppler, Color Doppler and Intracardiac            Opacification Agent Indications:    Dyspnea  History:        Patient has prior history of Echocardiogram examinations, most                 recent 06/07/2019. Arrythmias:Atrial Fibrillation; Risk                 Factors:Hypertension, Diabetes and Dyslipidemia.  Sonographer:    Karma Ganja Referring Phys: [id number] TODD D MCDIARMID  Sonographer Comments: Technically difficult study due to poor echo windows, patient is obese and suboptimal subcostal window. IMPRESSIONS  1. Technically difficult study with poor windows and patient in Afib with RVR. Left ventricular ejection fraction, by estimation, is 50 to 55%. The left ventricle has low normal function. The left ventricle has no regional wall motion abnormalities. There is moderate left ventricular hypertrophy. Left ventricular diastolic parameters are indeterminate.  2. Right ventricular systolic function is moderately reduced. The right ventricular size is mildly enlarged.  3. Left atrial size was moderately dilated.  4. Right atrial size was mildly dilated.  5. The mitral valve is normal in structure. Trivial mitral valve regurgitation. No evidence of mitral stenosis.  6. The aortic valve was not well visualized. Aortic valve regurgitation is not visualized. No aortic stenosis is present. FINDINGS  Left  Ventricle: Left ventricular ejection fraction, by estimation, is 50 to 55%. The left ventricle has low normal function. The left  ventricle has no regional wall motion abnormalities. Definity contrast agent was given IV to delineate the left ventricular endocardial borders. The left ventricular internal cavity size was normal in size. There is moderate left ventricular hypertrophy. Left ventricular diastolic parameters are indeterminate. Right Ventricle: The right ventricular size is mildly enlarged. Right vetricular wall thickness was not well visualized. Right ventricular systolic function is moderately reduced. Left Atrium: Left atrial size was moderately dilated. Right Atrium: Right atrial size was mildly dilated. Pericardium: There is no evidence of pericardial effusion. Mitral Valve: The mitral valve is normal in structure. Trivial mitral valve regurgitation. No evidence of mitral valve stenosis. Tricuspid Valve: The tricuspid valve is normal in structure. Tricuspid valve regurgitation is trivial. Aortic Valve: The aortic valve was not well visualized. Aortic valve regurgitation is not visualized. No aortic stenosis is present. Aortic valve mean gradient measures 2.0 mmHg. Aortic valve peak gradient measures 3.0 mmHg. Aortic valve area, by VTI measures 2.03 cm. Pulmonic Valve: The pulmonic valve was not well visualized. Pulmonic valve regurgitation is trivial. Aorta: The aortic root is normal in size and structure. IAS/Shunts: The interatrial septum was not well visualized.  LEFT VENTRICLE PLAX 2D LVIDd:         4.60 cm     Diastology LVIDs:         4.10 cm     LV e' medial:    6.02 cm/s LV PW:         1.60 cm     LV E/e' medial:  14.1 LV IVS:        1.60 cm     LV e' lateral:   11.97 cm/s LVOT diam:     2.10 cm     LV E/e' lateral: 7.1 LV SV:         28 LV SV Index:   13 LVOT Area:     3.46 cm  LV Volumes (MOD) LV vol d, MOD A2C: 44.5 ml LV vol d, MOD A4C: 86.9 ml LV vol s, MOD A2C: 13.0 ml LV vol s, MOD  A4C: 24.3 ml LV SV MOD A2C:     31.5 ml LV SV MOD A4C:     86.9 ml LV SV MOD BP:      45.8 ml RIGHT VENTRICLE RV Basal diam:  4.90 cm RV S prime:     5.01 cm/s TAPSE (M-mode): 1.4 cm LEFT ATRIUM              Index        RIGHT ATRIUM           Index LA diam:        5.50 cm  2.51 cm/m   RA Area:     26.50 cm LA Vol (A2C):   101.0 ml 46.13 ml/m  RA Volume:   71.40 ml  32.61 ml/m LA Vol (A4C):   96.9 ml  44.26 ml/m LA Biplane Vol: 101.0 ml 46.13 ml/m  AORTIC VALVE AV Area (Vmax):    1.92 cm AV Area (Vmean):   1.87 cm AV Area (VTI):     2.03 cm AV Vmax:           87.30 cm/s AV Vmean:          59.900 cm/s AV VTI:            0.138 m AV Peak Grad:      3.0 mmHg AV Mean Grad:      2.0 mmHg LVOT Vmax:  48.43 cm/s LVOT Vmean:        32.300 cm/s LVOT VTI:          0.081 m LVOT/AV VTI ratio: 0.59  AORTA Ao Root diam: 3.50 cm MITRAL VALVE               TRICUSPID VALVE MV Area (PHT): 5.73 cm    TR Peak grad:   31.4 mmHg MV Decel Time: 132 msec    TR Vmax:        280.00 cm/s MV E velocity: 84.77 cm/s                            SHUNTS                            Systemic VTI:  0.08 m                            Systemic Diam: 2.10 cm Epifanio Lesches MD Electronically signed by Epifanio Lesches MD Signature Date/Time: 03/30/2023/10:55:09 AM    Final    DG Chest Port 1 View Result Date: 03/30/2023 CLINICAL DATA:  142230 Pleural effusion 142230 S/P right hemithorax opacification, status post right pleural drainage catheter placement. Evaluate pneumothorax EXAM: PORTABLE CHEST 1 VIEW COMPARISON:  Chest x-ray 03/29/2023, CT chest 03/29/2023 FINDINGS: The heart and mediastinal contours are unchanged. Atherosclerotic plaque No focal consolidation. No pulmonary edema. Similar-appearing right loculated large volume pleural effusion. Persistent trace left pleural effusion. No pneumothorax. No acute osseous abnormality. IMPRESSION: 1.  Similar-appearing right loculated large volume pleural effusion. 2. Persistent  trace left pleural effusion. 3. No pneumothorax. 4.  Aortic Atherosclerosis (ICD10-I70.0). Electronically Signed   By: Tish Frederickson M.D.   On: 03/30/2023 01:06   US RENAL Result Date: 03/29/2023 CLINICAL DATA:  Oliguria EXAM: RENAL / URINARY TRACT ULTRASOUND COMPLETE COMPARISON:  06/03/2019 FINDINGS: Right Kidney: Renal measurements: 9.3 x 5.6 x 6.2 cm = volume: 168 mL. Echogenicity within normal limits. No mass or hydronephrosis visualized. Left Kidney: Renal measurements: 10.3 x 4.9 x 4.4 cm = volume: 118 mL. Poorly visible due to habitus and gas. No hydronephrosis or obvious mass. Possible mild renal cortical thinning. Bladder: Appears normal for degree of bladder distention. Other: None. IMPRESSION: Negative for hydronephrosis. Possible mild left renal cortical thinning but left kidney difficult to visualize due to habitus and bowel gas. Electronically Signed   By: Jasmine Pang M.D.   On: 03/29/2023 21:38   US Abdomen Limited RUQ (LIVER/GB) Result Date: 03/29/2023 CLINICAL DATA:  213305 Alcohol use 213305. EXAM: ULTRASOUND ABDOMEN LIMITED RIGHT UPPER QUADRANT COMPARISON:  None Available. FINDINGS: Examination is limited due to patient related factors. Gallbladder: No gallstones or wall thickening visualized. No sonographic Murphy sign noted by sonographer. Common bile duct: Diameter: Less than 4 mm.  No intrahepatic bile duct dilation. Liver: Not seen in the entirety. Left hepatic lobe is obscured by bowel gas. No focal lesion seen in the visualized portion. Within normal limits in parenchymal echogenicity. Portal vein is patent on color Doppler imaging with normal direction of blood flow towards the liver. Other: None. IMPRESSION: *Limited but grossly unremarkable exam. Electronically Signed   By: Jules Schick M.D.   On: 03/29/2023 18:27   DG Chest Port 1 View Result Date: 03/29/2023 CLINICAL DATA:  [id number] S/P thoracostomy tube placement (HCC) [id number]. EXAM: PORTABLE CHEST 1 VIEW COMPARISON:  03/29/2023, 9:33 a.m. FINDINGS: Redemonstration of mild opacification of right hemithorax, mildly improved since the prior study. Findings may represent underlying pleural effusion and atelectasis with or without consolidation. There is new right-sided pleural drainage catheter. There is apparent lucency overlying the right upper lung zone, concerning for a small pneumothorax. Attention on follow-up examination is recommended. Left lung is grossly clear. Stable cardio-mediastinal silhouette. No acute osseous abnormalities. The soft tissues are within normal limits. IMPRESSION: *Mild interval improvement in the right hemithorax opacification, status post right pleural drainage catheter placement. There is apparent lucency overlying the right upper lung zone, concerning for a small pneumothorax. Attention on follow-up examination is recommended. Electronically Signed   By: Jules Schick M.D.   On: 03/29/2023 16:48   CT CHEST WO CONTRAST Result Date: 03/29/2023 CLINICAL DATA:  Respiratory illness. EXAM: CT CHEST WITHOUT CONTRAST TECHNIQUE: Multidetector CT imaging of the chest was performed following the standard protocol without IV contrast. RADIATION DOSE REDUCTION: This exam was performed according to the departmental dose-optimization program which includes automated exposure control, adjustment of the mA and/or kV according to patient size and/or use of iterative reconstruction technique. COMPARISON:  X-ray earlier 03/29/2023. FINDINGS: Cardiovascular: Trace pericardial fluid or thickening. Heart slightly enlarged. Coronary artery calcifications are seen. Prominent epicardial fat. The thoracic aorta has a overall normal course and caliber with scattered diffuse calcified plaque. There is a bovine type aortic arch, normal variant. Significant stenosis suggested at the origin of the right subclavian artery and right common carotid artery additional evaluation with CTA can be performed when clinically  appropriate. There is also significant plaque along the left common carotid artery origin. Mediastinum/Nodes: Slightly patulous thoracic esophagus with some luminal gas. Small thyroid gland with a cystic nodule involving the right thyroid lobe measuring 2.4 by 1.4 cm. Recommend further workup with thyroid ultrasound when appropriate if there is no known history. On this non IV contrast exam there is no specific abnormal lymph node enlargement present in the axillary regions hilum. Several small less than 1 cm in size in short axis nodes are seen in the mediastinum, nonpathologic by size criteria. There is some edema along the mediastinum inferiorly on the right side. Prominent supraclavicular node on the left on series 3, image 9 has short axis of 10 mm. Lungs/Pleura: There is a calcified left lower lobe lung nodule series 3, image 27 consistent with old granulomatous disease. Tiny left pleural effusion. No left-sided consolidation, pneumothorax otherwise. Right lung has a large pleural effusion with pleural thickening. The inferior aspect is incompletely included in the imaging field. Opacity seen in the visualized portions of the right lung with a rounded areas of opacity along the right lower lobe. Although this could be an infiltrate trait this also could be an area of rounded atelectasis. Upper Abdomen: Limited visualization of the upper abdomen. The adrenal glands are incompletely included in the imaging field. Spleen is enlarged measuring at least 15.5 cm where visible. Musculoskeletal: Mild degenerative changes along the spine. Old left chronic appearing rib deformities. IMPRESSION: Large right pleural effusion with pleural thickening. Associated opacity including rounded areas of opacity in the right lower lobe. Although this could be an infiltrate or lesion this could be an area of rounded atelectasis. Please correlate for any known history or prior. Very small left pleural effusion. Splenomegaly.  Incompletely included in the imaging field. Further workup when clinically appropriate. 2.4 cm cystic lesion in the right thyroid lobe. Please correlate with any prior dedicated thyroid ultrasound when appropriate. Enlarged  heart with the extensive vascular calcifications including potential significant stenosis along the great vessels including brachiocephalic, common carotid and right subclavian arteries. Further workup with CTA when clinically appropriate. Nonspecific mediastinal stranding with some small nodes including 1 slightly abnormally enlarged supraclavicular node on the left side. Aortic Atherosclerosis (ICD10-I70.0). Electronically Signed   By: Karen Kays M.D.   On: 03/29/2023 11:42   DG Chest 2 View Result Date: 03/29/2023 CLINICAL DATA:  Shortness of breath for 3 weeks EXAM: CHEST - 2 VIEW COMPARISON:  05/31/2019 FINDINGS: Large right pleural effusion. Small left. Underinflation with enlarged heart. Calcified aorta. No pneumothorax or edema. Film is under penetrated. IMPRESSION: Large right pleural effusion and tiny left. Underinflation. Enlarged cardiopericardial silhouette. Please correlate with history or further workup when appropriate. Electronically Signed   By: Karen Kays M.D.   On: 03/29/2023 10:05    Medications: Infusions:   prismasol BGK 4/2.5 400 mL/hr at 03/31/23 0500    prismasol BGK 4/2.5 400 mL/hr at 03/31/23 0500   sodium chloride 3 mL/hr at 03/31/23 0800   calcium gluconate     cefTRIAXone (ROCEPHIN)  IV Stopped (03/30/23 1257)   heparin 10,000 units/ 20 mL infusion syringe 500 Units/hr (03/30/23 1806)   linezolid (ZYVOX) IV Stopped (03/30/23 2334)   norepinephrine (LEVOPHED) Adult infusion 10 mcg/min (03/31/23 0800)   prismasol BGK 4/2.5 1,500 mL/hr at 03/31/23 0706    Scheduled Medications:  alteplase (CATHFLO ACTIVASE) 10 mg in sodium chloride (PF) 0.9 % 30 mL  10 mg Intrapleural Once   And   dornase alfa (PULMOZYME) 5 mg in sterile water (preservative  free) 30 mL  5 mg Intrapleural Once   aspirin  81 mg Oral Daily   atorvastatin  40 mg Oral Daily   Chlorhexidine Gluconate Cloth  6 each Topical Daily   folic acid  1 mg Oral Daily   insulin aspart  0-5 Units Subcutaneous QHS   insulin aspart  0-9 Units Subcutaneous TID WC   multivitamin with minerals  1 tablet Oral Daily   sodium chloride flush  10 mL Intrapleural Q8H   sodium chloride flush  10 mL Intrapleural Q8H   thiamine  100 mg Oral Daily    have reviewed scheduled and prn medications.  Physical Exam: General:NAD, comfortable Heart:RRR, s1s2 nl Lungs: Right chest tube with pinkish drainage, no increased work of breathing Abdomen:soft, Non-tender, non-distended Extremities:b/l leg edema+ Dialysis Access: Right IJ temporary HD catheter placed by PCCM on 1/15.  Carmita Boom Jaynie Collins 03/31/2023,9:02 AM  LOS: 2 days

## 2023-03-31 NOTE — Progress Notes (Signed)
Initial Nutrition Assessment  DOCUMENTATION CODES:   Non-severe (moderate) malnutrition in context of acute illness/injury  INTERVENTION:  Liberalize diet to regular to encourage PO intake Ensure Enlive po TID, each supplement provides 350 kcal and 20 grams of protein.  Renal MVI BID 2/2 CRRT 30 ml ProSource Plus BID, each supplement provides 100 kcals and 15 grams protein.    NUTRITION DIAGNOSIS:  Moderate Malnutrition related to acute illness as evidenced by moderate muscle depletion, mild fat depletion, energy intake < 75% for > 7 days.  GOAL:  Patient will meet greater than or equal to 90% of their needs  MONITOR:  Supplement acceptance, PO intake  REASON FOR ASSESSMENT:   Consult Assessment of nutrition requirement/status  ASSESSMENT:   Pt presented to ED w/ SOB and found to have AKI d/t ischemic ATN r/t septic shock. PMH: HTN, T2DM, CAD, A-fib. Has large R pleural effusion and small L pleural effusion. Chst tube in place and culture grew staph aureus. CRRT also initiated.  01/14 - Admitted 01/15 - CRRT initiated  Spoke with pt and his wife at bedside. She endorses decreased appetite x3-6 months. Patient largest meal of the day is breakfast, which is consumed late. He skips lunch and eats "only bites" of his dinner. He has shown overall decline since October of 2023 when he retired and has been much less active. Interview notable for consumption of alcohol. Wife reports he mixes at least 4-6oz of vodka with diet soda every evening. Thiamine and folic acid ordered. No issues chewing or swallowing noted. Documented intake of 100% x2 meals today.   Regular diet to promote PO intake. Pt amicable to Ensure supplement to improve protein intake. At risk for micronutrient deficiency as well 2/2 CRRT and prolonged poor PO intake. He is at risk for refeeding 2/2 to this. PHOS, K+, Mg being monitored.   He remains on norepinephrine while undergoing CRRT trending up to 22mcg/min from  60mcg/min.   Admit Weight: 95.3kg Current Weight: 96.3kg  Net IO Since Admission: -2,477.34 mL [03/31/23 1511]   UBW is around 211 pounds. Does not endorse any significant weight gain or loss over the last six months. Likely masked by fluid accumulation.    Could not appreciate degree of muscle wasting to BLEs due to significant edema. Will re-assess muscle/fat wasting at later time and update nutrition dx as indicated. Concern for severe muscle wasting to BLEs.   Meds: folic acid, SSI, MVI, thiamine, IV ABX, levophed  Sodium trending down. PHOS within desirable range. MRI of back scheduled for today. Concern for back infection with concomitant staph in lungs.  Labs:  Na+ 134-->132 CBGs 138-176 over 48 hours A1c 6.6 (03/2023) PHOS 6.1-->3.8 WBC 14.0-->11.2-->11.5  NUTRITION - FOCUSED PHYSICAL EXAM:  Flowsheet Row Most Recent Value  Orbital Region Mild depletion  Upper Arm Region Mild depletion  Thoracic and Lumbar Region No depletion  Buccal Region Mild depletion  Temple Region Moderate depletion  Clavicle Bone Region Moderate depletion  Clavicle and Acromion Bone Region Moderate depletion  Scapular Bone Region Moderate depletion  Dorsal Hand Moderate depletion  Anterior Thigh Region Unable to assess  Posterior Calf Region Unable to assess  Edema (RD Assessment) Severe  Hair Reviewed  Eyes Reviewed  Mouth Reviewed  Skin Reviewed  Nails Reviewed   Diet Order:   Diet Order             Diet regular Room service appropriate? Yes; Fluid consistency: Thin  Diet effective now  EDUCATION NEEDS:   Education needs have been addressed  Skin:  Skin Assessment: Reviewed RN Assessment  Last BM:  01/13 - PTA  Height:  Ht Readings from Last 1 Encounters:  03/29/23 6\' 1"  (1.854 m)    Weight:  Wt Readings from Last 1 Encounters:  03/31/23 96.3 kg    Ideal Body Weight:  83.6 kg  BMI:  Body mass index is 28.01 kg/m.  Estimated Nutritional Needs:    Kcal:  2100-2300kcal  Protein:  130-140g  Fluid:  >2L/day  Myrtie Cruise MS, RD, LDN Registered Dietitian Clinical Nutrition RD Inpatient Contact Info in Amion

## 2023-03-31 NOTE — Progress Notes (Addendum)
Advanced Heart Failure Rounding Note  Cardiologist: Rollene Rotunda, MD   Chief Complaint: Fatigue  Subjective:    Very fatigued. States he hasn't felt well for some time.    Objective:   Weight Range: 96.3 kg Body mass index is 28.01 kg/m.   Vital Signs:   Temp:  [97.3 F (36.3 C)-98.2 F (36.8 C)] 98.2 F (36.8 C) (01/16 1200) Pulse Rate:  [94-135] 94 (01/16 1200) Resp:  [0-29] 24 (01/16 1200) BP: (71-121)/(38-84) 108/84 (01/16 1200) SpO2:  [82 %-100 %] 95 % (01/16 1200) Weight:  [96.3 kg] 96.3 kg (01/16 0613) Last BM Date : 03/28/23 (PTA)  Weight change: Filed Weights   03/29/23 1200 03/30/23 0200 03/31/23 0613  Weight: 95.3 kg 94.5 kg 96.3 kg    Intake/Output:   Intake/Output Summary (Last 24 hours) at 03/31/2023 1254 Last data filed at 03/31/2023 1200 Gross per 24 hour  Intake 1747.11 ml  Output 2904.3 ml  Net -1157.19 ml      Physical Exam    General:  Fatigued appearing. Conversational dyspnea. HEENT: Hard of hearing. Neck: Supple. JVP to midneck. R internal jugular HD cath Cor: PMI nondisplaced. Irregular rhythm, tachy. No rubs, gallops or murmurs. Lungs: Diminished on right Abdomen: Soft, nontender, nondistended.  Extremities: No cyanosis, clubbing, rash, edema Neuro: Alert & orientedx3. Affect pleasant   Telemetry   Afib 110s-120s   Labs    CBC Recent Labs    03/30/23 0251 03/31/23 0241  WBC 11.2* 11.5*  NEUTROABS 9.7*  --   HGB 10.4* 10.0*  HCT 32.6* 31.5*  MCV 101.6* 101.9*  PLT 135* 182   Basic Metabolic Panel Recent Labs    46/96/29 0251 03/31/23 0241  NA 134* 132*  K 4.9 4.8  CL 97* 98  CO2 24 25  GLUCOSE 138* 176*  BUN 59* 45*  CREATININE 3.79* 3.32*  CALCIUM 8.3* 7.7*  MG 2.2 2.2  PHOS  --  3.8   Liver Function Tests Recent Labs    03/29/23 0903 03/29/23 1832 03/30/23 0251 03/31/23 0241  AST 28  --  24  --   ALT 24  --  23  --   ALKPHOS 84  --  68  --   BILITOT 1.9*  --  1.3*  --   PROT 6.8  6.2* 6.5  --   ALBUMIN 2.8*  --  2.7* 2.5*   No results for input(s): "LIPASE", "AMYLASE" in the last 72 hours. Cardiac Enzymes No results for input(s): "CKTOTAL", "CKMB", "CKMBINDEX", "TROPONINI" in the last 72 hours.  BNP: BNP (last 3 results) Recent Labs    03/30/23 0251  BNP 346.8*    ProBNP (last 3 results) No results for input(s): "PROBNP" in the last 8760 hours.   D-Dimer No results for input(s): "DDIMER" in the last 72 hours. Hemoglobin A1C Recent Labs    03/29/23 1832  HGBA1C 6.6*   Fasting Lipid Panel No results for input(s): "CHOL", "HDL", "LDLCALC", "TRIG", "CHOLHDL", "LDLDIRECT" in the last 72 hours. Thyroid Function Tests Recent Labs    03/29/23 1832  TSH 3.279    Other results:   Imaging    DG CHEST PORT 1 VIEW Result Date: 03/31/2023 CLINICAL DATA:  Chest tube in place. EXAM: PORTABLE CHEST 1 VIEW COMPARISON:  Chest radiograph dated 03/30/2023. FINDINGS: Right IJ catheter and right chest tube in similar position. No significant interval change in right pleural effusion with associated right lung atelectasis. No pneumothorax. Stable cardiac silhouette. No acute osseous pathology. IMPRESSION: No  significant interval change. Electronically Signed   By: Elgie Collard M.D.   On: 03/31/2023 09:43   DG CHEST PORT 1 VIEW Result Date: 03/30/2023 CLINICAL DATA:  Line placement EXAM: PORTABLE CHEST 1 VIEW COMPARISON:  X-ray 03/29/2023 and older FINDINGS: New right IJ line with the tip along the central SVC above the right atrium. No pneumothorax. Enlarged cardiopericardial silhouette again seen with a large right pleural effusion and small left. Pigtail catheter in the right lung base. Associated right lung opacities. No pneumothorax. No edema. Overlapping cardiac leads. Film is under penetrated IMPRESSION: New right IJ line in place.  No pneumothorax Electronically Signed   By: Karen Kays M.D.   On: 03/30/2023 18:12     Medications:     Scheduled  Medications:  aspirin  81 mg Oral Daily   atorvastatin  40 mg Oral Daily   Chlorhexidine Gluconate Cloth  6 each Topical Daily   folic acid  1 mg Oral Daily   insulin aspart  0-5 Units Subcutaneous QHS   insulin aspart  0-9 Units Subcutaneous TID WC   multivitamin with minerals  1 tablet Oral Daily   sodium chloride flush  10 mL Intrapleural Q8H   sodium chloride flush  10 mL Intrapleural Q8H   thiamine  100 mg Oral Daily    Infusions:   prismasol BGK 4/2.5 400 mL/hr at 03/31/23 0500    prismasol BGK 4/2.5 400 mL/hr at 03/31/23 0500   sodium chloride 3 mL/hr at 03/31/23 1200    ceFAZolin (ANCEF) IV     heparin 10,000 units/ 20 mL infusion syringe 500 Units/hr (03/31/23 1128)   norepinephrine (LEVOPHED) Adult infusion 10 mcg/min (03/31/23 1200)   prismasol BGK 4/2.5 1,500 mL/hr at 03/31/23 0706    PRN Medications: acetaminophen **OR** acetaminophen, heparin, oxyCODONE    Patient Profile   83 y.o. 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 Admitted with large right pleural effusion>>empyema, Afib with RVR, AKI on CKD and volume overload.  Assessment/Plan  Afib with RVR -Prior hx of Afib in 2021 in setting of hypovolemic shock and DRESS -Had not been anticoagulated d/t lack of recurrence -recurrent Afib with RVR this admit -Will discuss timing of starting heparin gtt with CCM.  -May need eventual DCCV if he does not convert. May need amiodarone gtt.  2. Acute on chronic HFpEF with RV failure -Hx HFrEF with recovered EF -Echo this admit with EF 50-55%, moderate LVH, RV moderately reduced -Became hypotensive and had poor response to IV diuretics. Started on CVVH yesterday for volume management. Current goal negative -100/hr.  -Set up CVP monitoring  3. Septic shock -2/2 Staph aureus empyema s/p chest tube placement -On cefazolin per ID -Currently requiring vasopressor support with  NE  4. CAD -60-70% LM and high-grade ramus disease on cath at [facility] in 2014. Managed medically -Continue aspirin and statin  5. ETOH abuse -Consumes 6 oz liquor daily -CIWA protocol  Length of Stay: 2  FINCH, LINDSAY N, PA-C  03/31/2023, 12:54 PM  Advanced Heart Failure Team Pager [phone] (M-F; 7a - 5p)  Please contact [facility] for night-coverage after hours (5p -7a ) and weekends on [email or web address]   Patient seen with PA, agree with the above note.   He has started CVVH, trying to pull UF net negative 100 cc/hr.    He remains in atrial fibrillation rate 100s-110s.  He is not on heparin  gtt due to bloody output from chest tube.   He remains on cefazolin for MSSA empyema.   Complains of back pain.   General: NAD Neck: JVP 10 cm, no thyromegaly or thyroid nodule.  Lungs: Clear to auscultation bilaterally with normal respiratory effort. CV: Nondisplaced PMI.  Heart tachy, irregular S1/S2, no S3/S4, no murmur.  No peripheral edema.    Abdomen: Soft, nontender, no hepatosplenomegaly, no distention.  Skin: Intact without lesions or rashes.  Neurologic: Alert and oriented x 3.  Psych: Normal affect. Extremities: No clubbing or cyanosis.  HEENT: Normal.   Assessment/Plan: 1. Atrial fibrillation: Mild RVR, rate 100s-110s.  Prior history of PAF with DCCV.  Currently off heparin gtt due to bloody chest tube output.  - HR not markedly high, can add amiodarone gtt for rate control if HR increases.  - Will discuss timing of resumption of anticoagulation with CCM (heparin gtt).  2. ID: Patient has right-sided empyema with MSSA.  He has back pain as well.  - Continue cefazolin for MSSA.  - Chest tube for empyema, bloody output.  - Needs MRI back to rule out vertebral seeding with osteomyelitis/abscess.  3. Shock: Suspect primarily septic shock with MSSA empyema.  He is on NE at 10 currently with CVVH.  - Continue NE, titrate down as MAP tolerates.  4. Acute on chronic systolic  CHF: With RV dysfunction.  Echo 1/25 with EF 50-55%, moderate LVH, moderate RV dysfunction, mild RV enlargement.  He is volume overloaded on exam, volume management now via CVVH.  - Continue CVVH, goal net UF 100 cc/hr for now.  - Using NE to maintain MAP in setting of suspected septic shock.  5. AKI on CKD stage 4: Suspect ATN in setting of shock, now on CVVH.  - Goal UF 100 cc/hr net for now.  6. ETOH use/abuse: CIWA protocol 7. CAD: Cath 2014 with 60-70% distal left main.  No intervention.  He denies chest pain.  - Continue ASA 81 - Continue statin.   CRITICAL CARE Performed by: Marca Ancona  Total critical care time: 40 minutes  Critical care time was exclusive of separately billable procedures and treating other patients.  Critical care was necessary to treat or prevent imminent or life-threatening deterioration.  Critical care was time spent personally by me on the following activities: development of treatment plan with patient and/or surrogate as well as nursing, discussions with consultants, evaluation of patient's response to treatment, examination of patient, obtaining history from patient or surrogate, ordering and performing treatments and interventions, ordering and review of laboratory studies, ordering and review of radiographic studies, pulse oximetry and re-evaluation of patient's condition.  Marca Ancona 03/31/2023 3:53 PM

## 2023-04-01 ENCOUNTER — Inpatient Hospital Stay (HOSPITAL_COMMUNITY): Payer: Medicare Other

## 2023-04-01 DIAGNOSIS — J90 Pleural effusion, not elsewhere classified: Secondary | ICD-10-CM | POA: Diagnosis not present

## 2023-04-01 DIAGNOSIS — J869 Pyothorax without fistula: Secondary | ICD-10-CM | POA: Diagnosis not present

## 2023-04-01 DIAGNOSIS — B9561 Methicillin susceptible Staphylococcus aureus infection as the cause of diseases classified elsewhere: Secondary | ICD-10-CM | POA: Diagnosis not present

## 2023-04-01 LAB — RENAL FUNCTION PANEL
Albumin: 2.5 g/dL — ABNORMAL LOW (ref 3.5–5.0)
Albumin: 2.4 g/dL — ABNORMAL LOW (ref 3.5–5.0)
Anion gap: 9 (ref 5–15)
Anion gap: 10 (ref 5–15)
BUN: 23 mg/dL (ref 8–23)
BUN: 20 mg/dL (ref 8–23)
CO2: 24 mmol/L (ref 22–32)
CO2: 24 mmol/L (ref 22–32)
Calcium: 8.1 mg/dL — ABNORMAL LOW (ref 8.9–10.3)
Calcium: 8.2 mg/dL — ABNORMAL LOW (ref 8.9–10.3)
Chloride: 100 mmol/L (ref 98–111)
Chloride: 98 mmol/L (ref 98–111)
Creatinine, Ser: 2.38 mg/dL — ABNORMAL HIGH (ref 0.61–1.24)
Creatinine, Ser: 2.05 mg/dL — ABNORMAL HIGH (ref 0.61–1.24)
GFR, Estimated: 27 mL/min — ABNORMAL LOW (ref >60)
GFR, Estimated: 32 mL/min — ABNORMAL LOW (ref >60)
Glucose, Bld: 127 mg/dL — ABNORMAL HIGH (ref 70–99)
Glucose, Bld: 112 mg/dL — ABNORMAL HIGH (ref 70–99)
Phosphorus: 3.2 mg/dL (ref 2.5–4.6)
Phosphorus: 2.9 mg/dL (ref 2.5–4.6)
Potassium: 5 mmol/L (ref 3.5–5.1)
Potassium: 4.9 mmol/L (ref 3.5–5.1)
Sodium: 133 mmol/L — ABNORMAL LOW (ref 135–145)
Sodium: 132 mmol/L — ABNORMAL LOW (ref 135–145)

## 2023-04-01 LAB — GLUCOSE, CAPILLARY
Glucose-Capillary: 113 mg/dL — ABNORMAL HIGH (ref 70–99)
Glucose-Capillary: 118 mg/dL — ABNORMAL HIGH (ref 70–99)
Glucose-Capillary: 119 mg/dL — ABNORMAL HIGH (ref 70–99)
Glucose-Capillary: 121 mg/dL — ABNORMAL HIGH (ref 70–99)
Glucose-Capillary: 133 mg/dL — ABNORMAL HIGH (ref 70–99)
Glucose-Capillary: 93 mg/dL (ref 70–99)

## 2023-04-01 LAB — CBC
HCT: 29.8 % — ABNORMAL LOW (ref 39.0–52.0)
HCT: 29.3 % — ABNORMAL LOW (ref 39.0–52.0)
Hemoglobin: 9.2 g/dL — ABNORMAL LOW (ref 13.0–17.0)
Hemoglobin: 9.2 g/dL — ABNORMAL LOW (ref 13.0–17.0)
MCH: 32.2 pg (ref 26.0–34.0)
MCH: 32.6 pg (ref 26.0–34.0)
MCHC: 30.9 g/dL (ref 30.0–36.0)
MCHC: 31.4 g/dL (ref 30.0–36.0)
MCV: 104.2 fL — ABNORMAL HIGH (ref 80.0–100.0)
MCV: 103.9 fL — ABNORMAL HIGH (ref 80.0–100.0)
Platelets: 162 10*3/uL (ref 150–400)
Platelets: 136 10*3/uL — ABNORMAL LOW (ref 150–400)
RBC: 2.86 MIL/uL — ABNORMAL LOW (ref 4.22–5.81)
RBC: 2.82 MIL/uL — ABNORMAL LOW (ref 4.22–5.81)
RDW: 17.2 % — ABNORMAL HIGH (ref 11.5–15.5)
RDW: 17.1 % — ABNORMAL HIGH (ref 11.5–15.5)
WBC: 9.7 10*3/uL (ref 4.0–10.5)
WBC: 8.9 10*3/uL (ref 4.0–10.5)
nRBC: 0 % (ref 0.0–0.2)
nRBC: 0 % (ref 0.0–0.2)

## 2023-04-01 LAB — MAGNESIUM
Magnesium: 2.3 mg/dL (ref 1.7–2.4)
Magnesium: 2.4 mg/dL (ref 1.7–2.4)

## 2023-04-01 LAB — APTT: aPTT: 27 s (ref 24–36)

## 2023-04-01 LAB — PHOSPHORUS: Phosphorus: 2.7 mg/dL (ref 2.5–4.6)

## 2023-04-01 MED ORDER — NICOTINE 21 MG/24HR TD PT24
21.0000 mg | MEDICATED_PATCH | Freq: Every day | TRANSDERMAL | Status: DC
  Administered 2023-04-01 – 2023-04-17 (×17): 21 mg via TRANSDERMAL
  Filled 2023-04-01 (×17): qty 1

## 2023-04-01 MED ORDER — PHENOL 1.4 % MT LIQD
1.0000 | OROMUCOSAL | Status: DC | PRN
  Filled 2023-04-01: qty 177

## 2023-04-01 MED ORDER — VITAL 1.5 CAL PO LIQD
1000.0000 mL | ORAL | Status: DC
Start: 2023-04-01 — End: 2023-04-07
  Administered 2023-04-01 – 2023-04-07 (×6): 1000 mL
  Filled 2023-04-01: qty 1000

## 2023-04-01 MED ORDER — FOLIC ACID 1 MG PO TABS
1.0000 mg | ORAL_TABLET | Freq: Every day | ORAL | Status: DC
  Administered 2023-04-01 – 2023-04-05 (×5): 1 mg via ORAL
  Filled 2023-04-01 (×4): qty 1

## 2023-04-01 MED ORDER — ASPIRIN 81 MG PO CHEW
81.0000 mg | CHEWABLE_TABLET | Freq: Every day | ORAL | Status: DC
  Administered 2023-04-01 – 2023-04-11 (×11): 81 mg via ORAL
  Filled 2023-04-01 (×11): qty 1

## 2023-04-01 MED ORDER — LIDOCAINE 5 % EX PTCH
1.0000 | MEDICATED_PATCH | CUTANEOUS | Status: DC
  Administered 2023-04-01 – 2023-04-16 (×12): 1 via TRANSDERMAL
  Filled 2023-04-01 (×12): qty 1

## 2023-04-01 MED ORDER — HEPARIN SODIUM (PORCINE) 5000 UNIT/ML IJ SOLN
5000.0000 [IU] | Freq: Three times a day (TID) | INTRAMUSCULAR | Status: DC
  Administered 2023-04-01 – 2023-04-03 (×7): 5000 [IU] via SUBCUTANEOUS
  Filled 2023-04-01 (×7): qty 1

## 2023-04-01 MED ORDER — STERILE WATER FOR INJECTION IJ SOLN
5.0000 mg | Freq: Once | RESPIRATORY_TRACT | Status: AC
  Administered 2023-04-01: 5 mg via INTRAPLEURAL
  Filled 2023-04-01: qty 5

## 2023-04-01 MED ORDER — AMIODARONE HCL 200 MG PO TABS
400.0000 mg | ORAL_TABLET | Freq: Two times a day (BID) | ORAL | Status: DC
  Administered 2023-04-01 – 2023-04-11 (×21): 400 mg via ORAL
  Filled 2023-04-01 (×20): qty 2

## 2023-04-01 MED ORDER — ATORVASTATIN CALCIUM 40 MG PO TABS
40.0000 mg | ORAL_TABLET | Freq: Every day | ORAL | Status: DC
  Administered 2023-04-01 – 2023-04-11 (×11): 40 mg via ORAL
  Filled 2023-04-01 (×11): qty 1

## 2023-04-01 MED ORDER — SODIUM CHLORIDE (PF) 0.9 % IJ SOLN
10.0000 mg | Freq: Once | INTRAMUSCULAR | Status: AC
  Administered 2023-04-01: 10 mg via INTRAPLEURAL
  Filled 2023-04-01: qty 10

## 2023-04-01 MED ORDER — PROSOURCE TF20 ENFIT COMPATIBL EN LIQD
60.0000 mL | Freq: Three times a day (TID) | ENTERAL | Status: DC
  Administered 2023-04-01 – 2023-04-15 (×42): 60 mL
  Filled 2023-04-01 (×42): qty 60

## 2023-04-01 MED ORDER — AMIODARONE HCL 200 MG PO TABS
400.0000 mg | ORAL_TABLET | Freq: Two times a day (BID) | ORAL | Status: DC
  Filled 2023-04-01: qty 2

## 2023-04-01 NOTE — Evaluation (Signed)
Occupational Therapy Re-Evaluation Patient Details Name: Zachary Parrish MRN: [id number] DOB: February 07, 1941 Today's Date: 04/01/2023   History of Present Illness Zachary Parrish is an 83 year old gentleman admitted 1/14 presenting with greater than 2 months of progressive shortness of breath and leg edema. Over the last 2 weeks he has felt worse such that he is short of breath even at rest  He has been sleeping sitting upright sometimes in his car to prop him up enough. Pt also with afib. Pt with chest tube placed 1/14 due to pleural effusion. Pt  found to have AKI d/t ischemic ATN r/t septic shock with cultures growing staph aureus at chest tube 1/16. Pt transferred to ICU and CRRT initiated 1/16. PMH: A-fib many years ago, status post ablation and not recurrent so not on anticoagulation, CKD 3B   Clinical Impression   OT re-eval completed this day as pt found to have AKI d/t ischemic ATN r/t septic shock with cultures growing staph aureus at chest tube, was transferred to ICU, and CRRT was initiated 1/16. Pt now with decreased activity tolerance, decreased cognition, decreased balance, generalized B UE weakness, decreased B UE coordination, and decreased safety and independence with functional tasks. Pt now largely completing ADLs with Mod to Total assist +2 to +3 and with cues for initiation, sequencing, and safety. Pt participated well during session but limited by deficits. Pt will benefit from continued acute skilled OT services to address deficits and increase safety and independence with functional tasks. Goals and discharge recommendations updated this day. Post acute discharge, pt will benefit from intensive inpatient skilled rehab services < 3 hours per day to maximize rehab potential.       If plan is discharge home, recommend the following: Two people to help with walking and/or transfers;Two people to help with bathing/dressing/bathroom;Assistance with cooking/housework;Assistance with feeding;Direct  supervision/assist for medications management;Direct supervision/assist for financial management;Assist for transportation;Help with stairs or ramp for entrance;Supervision due to cognitive status    Functional Status Assessment  Patient has had a recent decline in their functional status and demonstrates the ability to make significant improvements in function in a reasonable and predictable amount of time.  Equipment Recommendations  Other (comment) (defer to next level of care)    Recommendations for Other Services       Precautions / Restrictions Precautions Precautions: Fall;Other (comment) Precaution Comments: watch BP and O2 Restrictions Weight Bearing Restrictions Per Provider Order: No Other Position/Activity Restrictions: R chest tube      Mobility Bed Mobility Overal bed mobility: Needs Assistance Bed Mobility: Rolling, Supine to Sit, Sit to Supine Rolling: Max assist, Total assist, +2 for physical assistance, +2 for safety/equipment   Supine to sit: Max assist, Total assist, +2 for physical assistance, +2 for safety/equipment, HOB elevated Sit to supine: Max assist, Total assist, +2 for physical assistance, +2 for safety/equipment   General bed mobility comments: Pt making good attempt to assist with bed mobility but largely Total assist +2 to +3.    Transfers Overall transfer level: Needs assistance                 General transfer comment: deferred this session for pt/therapist safety      Balance Overall balance assessment: Needs assistance Sitting-balance support: Single extremity supported, Bilateral upper extremity supported, Feet supported Sitting balance-Leahy Scale: Poor Sitting balance - Comments: Pt requiring Min to Max assist to maintain sitting balance during ADLs.  ADL either performed or assessed with clinical judgement   ADL Overall ADL's : Needs assistance/impaired   Eating/Feeding  Details (indicate cue type and reason): deferred this session for pt safety due to level of alertness Grooming: Wash/dry hands;Wash/dry face;Moderate assistance;Sitting;Cueing for sequencing (cues for initiation)   Upper Body Bathing: Maximal assistance;Cueing for sequencing;Sitting (cues for initiation)   Lower Body Bathing: Total assistance;+2 for physical assistance;+2 for safety/equipment;Bed level   Upper Body Dressing : Moderate assistance;Maximal assistance;Cueing for sequencing;Cueing for compensatory techniques;Sitting (cues for initiation)   Lower Body Dressing: Total assistance;+2 for physical assistance;+2 for safety/equipment;Bed level     Toilet Transfer Details (indicate cue type and reason): unable this session Toileting- Clothing Manipulation and Hygiene: Total assistance;+2 for physical assistance;+2 for safety/equipment;Bed level         General ADL Comments: Pt with significant decrease in activity tolerance, sitting balance, B UE coordination, and cognition affecting functional level.     Vision Baseline Vision/History: 0 No visual deficits Ability to See in Adequate Light: 0 Adequate Patient Visual Report: No change from baseline       Perception         Praxis         Pertinent Vitals/Pain Pain Assessment Pain Assessment: Faces Faces Pain Scale: Hurts even more Pain Location: R flank and R side of abdomen around Pain Descriptors / Indicators: Aching, Discomfort, Grimacing, Sore Pain Intervention(s): Limited activity within patient's tolerance, Monitored during session, Repositioned     Extremity/Trunk Assessment Upper Extremity Assessment Upper Extremity Assessment: Right hand dominant;Generalized weakness RUE Deficits / Details: generalized weakness; mild shoulder flexion deficits at baseline but WFL; decreased coordination RUE Coordination: decreased fine motor;decreased gross motor LUE Deficits / Details: generalized weakness; mild shoulder  flexion deficits at baseline but WFL; decreased coordination LUE Coordination: decreased fine motor;decreased gross motor   Lower Extremity Assessment Lower Extremity Assessment: Defer to PT evaluation   Cervical / Trunk Assessment Cervical / Trunk Assessment: Kyphotic   Communication Communication Communication: Difficulty communicating thoughts/reduced clarity of speech;Difficulty following commands/understanding (benefits from mildly increased volume) Cueing Techniques: Verbal cues;Tactile cues   Cognition Arousal: Lethargic, Obtunded (Obtunded at beginning of session with increased alertness once sitting EOB but with contiued lethargy) Behavior During Therapy: Flat affect Overall Cognitive Status: Impaired/Different from baseline Area of Impairment: Attention, Memory, Following commands, Safety/judgement, Awareness, Problem solving                   Current Attention Level: Focused Memory: Decreased short-term memory Following Commands: Follows one step commands with increased time Safety/Judgement: Decreased awareness of safety Awareness: Intellectual Problem Solving: Slow processing, Decreased initiation, Difficulty sequencing, Requires verbal cues, Requires tactile cues General Comments: Oriented to self, wife, and bring in the hospital. Pt with significant decreased in cognition since initial OT eval on 1/15     General Comments  HR largely 100 bpm to 120 bpm and with O2 sat >/94% on 2L continuous O2 through nasal cannula throughout session. Pt reported mild dizziness upon sitting EOB from supine but with BP stable. Pt's wife and RN also present during session.    Exercises     Shoulder Instructions      Home Living Family/patient expects to be discharged to:: Private residence Living Arrangements: Spouse/significant other Available Help at Discharge: Family;Available 24 hours/day;Neighbor;Available PRN/intermittently Type of Home: House Home Access: Stairs to  enter [employer] of Steps: 3 (from garage) Entrance Stairs-Rails: Right Home Layout: One level;Laundry or work area in basement;Able to live on main  level with bedroom/bathroom (stays on main level)     Bathroom Shower/Tub: Producer, television/film/video: Handicapped height Bathroom Accessibility: Yes How Accessible: Accessible via walker Home Equipment: Rolling Walker (2 wheels);Cane - quad;Wheelchair - manual;Hand held Engineer, civil (consulting) - single point          Prior Functioning/Environment Prior Level of Function : Independent/Modified Independent;Driving             Mobility Comments: Independent to Mod I for short household distances without an AD ADLs Comments: Indeepndent with ADLs, drives        OT Problem List: Decreased strength;Decreased activity tolerance;Impaired balance (sitting and/or standing);Decreased coordination;Decreased cognition;Decreased safety awareness;Decreased knowledge of use of DME or AE      OT Treatment/Interventions: Self-care/ADL training;Therapeutic exercise;DME and/or AE instruction;Therapeutic activities;Cognitive remediation/compensation;Patient/family education;Balance training    OT Goals(Current goals can be found in the care plan section) Acute Rehab OT Goals Patient Stated Goal: pt unable to state OT Goal Formulation: With family Time For Goal Achievement: 04/15/23 Potential to Achieve Goals: Good ADL Goals Pt Will Perform Upper Body Bathing: with min assist;sitting Pt Will Perform Lower Body Bathing: with mod assist;sit to/from stand;sitting/lateral leans Pt Will Perform Lower Body Dressing: with mod assist;sitting/lateral leans;sit to/from stand Pt Will Transfer to Toilet: with mod assist;bedside commode;ambulating (with least restrictive AD) Pt Will Perform Toileting - Clothing Manipulation and hygiene: with mod assist;sit to/from stand;sitting/lateral leans  OT Frequency: Min 1X/week    Co-evaluation PT/OT/SLP  Co-Evaluation/Treatment: Yes Reason for Co-Treatment: Complexity of the patient's impairments (multi-system involvement);Necessary to address cognition/behavior during functional activity;For patient/therapist safety;To address functional/ADL transfers   OT goals addressed during session: ADL's and self-care;Other (comment) (cognition)      AM-PAC OT "6 Clicks" Daily Activity     Outcome Measure Help from another person eating meals?: A Little Help from another person taking care of personal grooming?: A Lot Help from another person toileting, which includes using toliet, bedpan, or urinal?: Total Help from another person bathing (including washing, rinsing, drying)?: A Lot Help from another person to put on and taking off regular upper body clothing?: A Lot Help from another person to put on and taking off regular lower body clothing?: Total 6 Click Score: 11   End of Session Equipment Utilized During Treatment: Oxygen Nurse Communication: Mobility status  Activity Tolerance: Patient tolerated treatment well;Patient limited by fatigue;Patient limited by lethargy;Treatment limited secondary to medical complications (Comment) (limited by decreased cognition) Patient left: in bed;with call bell/phone within reach;with nursing/sitter in room;with family/visitor present  OT Visit Diagnosis: Other abnormalities of gait and mobility (R26.89);Muscle weakness (generalized) (M62.81);Ataxia, unspecified (R27.0);Other symptoms and signs involving cognitive function;Other (comment) (decreased activity tolerance)                Time: 1610-9604 OT Time Calculation (min): 28 min Charges:  OT General Charges $OT Visit: 1 Visit OT Evaluation $OT Re-eval: 1 Re-eval  Zachary Parrish "Orson Eva., OTR/L, MA Acute Rehab [phone]   Lendon Colonel 04/01/2023, 1:56 PM

## 2023-04-01 NOTE — Procedures (Signed)
Cortrak  Person Inserting Tube:  Annaliz Aven C, RD Tube Type:  Cortrak - 43 inches Tube Size:  10 Tube Location:  Left nare Secured by: Bridle Technique Used to Measure Tube Placement:  Marking at nare/corner of mouth Cortrak Secured At:  71 cm   Cortrak Tube Team Note:  Consult received to place a Cortrak feeding tube.   X-ray is required, abdominal x-ray has been ordered by the Cortrak team. Please confirm tube placement before using the Cortrak tube.   If the tube becomes dislodged please keep the tube and contact the Cortrak team at [email or web address] for replacement.  If after hours and replacement cannot be delayed, place a NG tube and confirm placement with an abdominal x-ray.    Sherilee Smotherman P., RD, LDN, CNSC See AMiON for contact information    

## 2023-04-01 NOTE — Procedures (Signed)
I saw and evaluated the patient on CRRT.  I reviewed the last 24 hours events.  Adjustments to CRRT prescription are made as needed.   Filed Weights   03/30/23 0200 03/31/23 0613 04/01/23 0515  Weight: 94.5 kg 96.3 kg 92.7 kg    Recent Labs  Lab 04/01/23 0258  NA 133*  K 5.0  CL 100  CO2 24  GLUCOSE 127*  BUN 23  CREATININE 2.38*  CALCIUM 8.1*  PHOS 3.2    Recent Labs  Lab 03/30/23 0251 03/31/23 0241 04/01/23 0353  WBC 11.2* 11.5* 9.7  NEUTROABS 9.7*  --   --   HGB 10.4* 10.0* 9.2*  HCT 32.6* 31.5* 29.8*  MCV 101.6* 101.9* 104.2*  PLT 135* 182 162    Scheduled Meds:  (feeding supplement) PROSource Plus  30 mL Oral BID BM   aspirin  81 mg Oral Daily   atorvastatin  40 mg Oral Daily   Chlorhexidine Gluconate Cloth  6 each Topical Daily   feeding supplement  237 mL Oral TID BM   folic acid  1 mg Oral Daily   heparin injection (subcutaneous)  5,000 Units Subcutaneous Q8H   insulin aspart  0-5 Units Subcutaneous QHS   insulin aspart  0-9 Units Subcutaneous TID WC   multivitamin  1 tablet Oral BID   nicotine  21 mg Transdermal Daily   phenobarbital  64.8 mg Oral Q8H   Followed by   [START ON 04/02/2023] phenobarbital  32.4 mg Oral Q8H   sodium chloride flush  10 mL Intrapleural Q8H   sodium chloride flush  10 mL Intrapleural Q8H   thiamine  100 mg Oral Daily   Or   thiamine  100 mg Intravenous Daily   Continuous Infusions:   prismasol BGK 4/2.5 400 mL/hr at 03/31/23 2118    prismasol BGK 4/2.5 400 mL/hr at 03/31/23 2124    ceFAZolin (ANCEF) IV Stopped (03/31/23 2035)   heparin 10,000 units/ 20 mL infusion syringe 500 Units/hr (03/31/23 1128)   norepinephrine (LEVOPHED) Adult infusion 7 mcg/min (04/01/23 0800)   prismasol BGK 4/2.5 1,500 mL/hr at 04/01/23 0830   PRN Meds:.acetaminophen **OR** acetaminophen, heparin, LORazepam **OR** LORazepam, oxyCODONE   Louie Bun,  MD 04/01/2023, 9:16 AM

## 2023-04-01 NOTE — Progress Notes (Addendum)
Advanced Heart Failure Rounding Note  Cardiologist: Rollene Rotunda, MD   Chief Complaint: A fib  Subjective:    Remains on CRRT   Restless/agitated overnight. On CIWA protocol.    Objective:   Weight Range: 92.7 kg Body mass index is 26.96 kg/m.   Vital Signs:   Temp:  [97.6 F (36.4 C)-98.7 F (37.1 C)] 98.7 F (37.1 C) (01/17 0800) Pulse Rate:  [82-159] 111 (01/17 0800) Resp:  [7-32] 21 (01/17 0800) BP: (81-132)/(38-95) 100/54 (01/17 0800) SpO2:  [71 %-100 %] 96 % (01/17 0800) Weight:  [92.7 kg] 92.7 kg (01/17 0515) Last BM Date : 04/01/23 (smear\)  Weight change: Filed Weights   03/30/23 0200 03/31/23 0613 04/01/23 0515  Weight: 94.5 kg 96.3 kg 92.7 kg    Intake/Output:   Intake/Output Summary (Last 24 hours) at 04/01/2023 0923 Last data filed at 04/01/2023 0800 Gross per 24 hour  Intake 995.48 ml  Output 3685.4 ml  Net -2689.92 ml     CVP 11-12  Physical Exam   General:  Frail.  No resp difficulty. On CRRT  HEENT: normal Neck: supple. JVP 10-11 . Carotids 2+ bilat; no bruits. No lymphadenopathy or thryomegaly appreciated. Cor: PMI nondisplaced. Irregular rate & rhythm. No rubs, gallops or murmurs. Lungs: clear. CT with bloody drainage.  Abdomen: soft, nontender, nondistended. No hepatosplenomegaly. No bruits or masses. Good bowel sounds. Extremities: no cyanosis, clubbing, rash, edema Neuro: Drowsy  moves all 4 extremities w/o difficulty.  Telemetry  A fib 100s   Labs    CBC Recent Labs    03/30/23 0251 03/31/23 0241 04/01/23 0353  WBC 11.2* 11.5* 9.7  NEUTROABS 9.7*  --   --   HGB 10.4* 10.0* 9.2*  HCT 32.6* 31.5* 29.8*  MCV 101.6* 101.9* 104.2*  PLT 135* 182 162   Basic Metabolic Panel Recent Labs    16/10/96 0241 03/31/23 2108 04/01/23 0258  NA 132* 133* 133*  K 4.8 5.2* 5.0  CL 98 99 100  CO2 25 26 24   GLUCOSE 176* 136* 127*  BUN 45* 26* 23  CREATININE 3.32* 2.51* 2.38*  CALCIUM 7.7* 8.3* 8.1*  MG 2.2  --  2.3  PHOS  3.8 2.7 3.2   Liver Function Tests Recent Labs    03/29/23 1832 03/30/23 0251 03/31/23 0241 03/31/23 2108 04/01/23 0258  AST  --  24  --   --   --   ALT  --  23  --   --   --   ALKPHOS  --  68  --   --   --   BILITOT  --  1.3*  --   --   --   PROT 6.2* 6.5  --   --   --   ALBUMIN  --  2.7*   < > 2.5* 2.5*   < > = values in this interval not displayed.   No results for input(s): "LIPASE", "AMYLASE" in the last 72 hours. Cardiac Enzymes No results for input(s): "CKTOTAL", "CKMB", "CKMBINDEX", "TROPONINI" in the last 72 hours.  BNP: BNP (last 3 results) Recent Labs    03/30/23 0251  BNP 346.8*    ProBNP (last 3 results) No results for input(s): "PROBNP" in the last 8760 hours.   D-Dimer No results for input(s): "DDIMER" in the last 72 hours. Hemoglobin A1C Recent Labs    03/29/23 1832  HGBA1C 6.6*   Fasting Lipid Panel No results for input(s): "CHOL", "HDL", "LDLCALC", "TRIG", "CHOLHDL", "LDLDIRECT" in the last  72 hours. Thyroid Function Tests Recent Labs    03/29/23 1832  TSH 3.279    Other results:   Imaging    MR LUMBAR SPINE WO CONTRAST Result Date: 03/31/2023 CLINICAL DATA:  Low back pain, infection suspected, no prior imaging EXAM: MRI LUMBAR SPINE WITHOUT CONTRAST TECHNIQUE: Multiplanar, multisequence MR imaging of the lumbar spine was performed. No intravenous contrast was administered. COMPARISON:  None Available. FINDINGS: Segmentation: Transitional lumbosacral anatomy with partially lumbarized S1 segment. Alignment: Slight (grade 1) stepwise retrolisthesis of L2 on L3 and L3 on L4. Otherwise, no substantial sagittal subluxation. Vertebrae: Mild degenerative/discogenic endplate signal changes at L2-L3. No specific evidence of discitis/osteomyelitis or suspicious bone lesion. Conus medullaris and cauda equina: Conus extends to the T12-L1 level. Conus and cauda equina appear normal. Paraspinal and other soft tissues: Unremarkable. Disc levels: T12-L1: No  significant disc protrusion, foraminal stenosis, or canal stenosis. L1-L2: Mild disc bulge.  No significant stenosis. L2-L3: Mild disc bulge.  No significant stenosis. L3-L4: Mild disc bulge. Bilateral facet arthropathy. No significant stenosis. L4-L5: Mild disc bulge and bilateral facet arthropathy. Resulting mild left foraminal stenosis. Patent canal and right foramen. L5-S1: Mild disc bulging. Bilateral facet arthropathy. No significant stenosis. S1-S2: Transitional anatomy. Right eccentric endplate spurring without significant stenosis. IMPRESSION: 1. No specific evidence of discitis/osteomyelitis on noncontrast imaging. 2. At L4-L5, mild foraminal stenosis. Otherwise, lower lumbar degenerative change without significant stenosis. 3. Transitional lumbosacral anatomy with partially lumbarized S1 vertebral body. Correlation with radiographs is recommended prior to any operative intervention. Electronically Signed   By: Feliberto Harts M.D.   On: 03/31/2023 20:00     Medications:     Scheduled Medications:  (feeding supplement) PROSource Plus  30 mL Oral BID BM   aspirin  81 mg Oral Daily   atorvastatin  40 mg Oral Daily   Chlorhexidine Gluconate Cloth  6 each Topical Daily   feeding supplement  237 mL Oral TID BM   folic acid  1 mg Oral Daily   heparin injection (subcutaneous)  5,000 Units Subcutaneous Q8H   insulin aspart  0-5 Units Subcutaneous QHS   insulin aspart  0-9 Units Subcutaneous TID WC   multivitamin  1 tablet Oral BID   nicotine  21 mg Transdermal Daily   phenobarbital  64.8 mg Oral Q8H   Followed by   Melene Muller ON 04/02/2023] phenobarbital  32.4 mg Oral Q8H   sodium chloride flush  10 mL Intrapleural Q8H   sodium chloride flush  10 mL Intrapleural Q8H   thiamine  100 mg Oral Daily   Or   thiamine  100 mg Intravenous Daily    Infusions:   prismasol BGK 4/2.5 400 mL/hr at 03/31/23 2118    prismasol BGK 4/2.5 400 mL/hr at 03/31/23 2124    ceFAZolin (ANCEF) IV Stopped  (03/31/23 2035)   heparin 10,000 units/ 20 mL infusion syringe 500 Units/hr (03/31/23 1128)   norepinephrine (LEVOPHED) Adult infusion 7 mcg/min (04/01/23 0800)   prismasol BGK 4/2.5 1,500 mL/hr at 04/01/23 0830    PRN Medications: acetaminophen **OR** acetaminophen, heparin, LORazepam **OR** LORazepam, oxyCODONE    Patient Profile   83 y.o. 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 Admitted with large right pleural effusion>>empyema, Afib with RVR, AKI on CKD and volume overload.  Assessment/Plan  1. Atrial fibrillation: Mild RVR.  Prior history of PAF with DCCV.  Currently off heparin gtt due to  bloody chest tube output.  -  Rates 100-110s. Consider amio if needed.  2. ID: Patient has right-sided empyema with MSSA.  He has back pain as well.  - Continue cefazolin for MSSA.  - Chest tube for empyema, bloody output.  -  MRI back - no osteomyelitis/abscess.  3. Shock: Suspect primarily septic shock with MSSA empyema.   -Remains on NE 7 mcg.   4. Acute on chronic diastolic CHF: With RV dysfunction.  Echo 1/25 with EF 50-55%, moderate LVH, moderate RV dysfunction, mild RV enlargement.  - CVP 11-12.Volume managed via CVVH.  - Continue CVVH, goal net UF 100 cc/hr - Using NE to maintain MAP in setting of suspected septic shock.  5. AKI on CKD stage 4: Suspect ATN in setting of shock, now on CVVH.  - Goal UF 100 cc/hr net for now.  6. ETOH use/abuse: CIWA protocol 7. CAD: Cath 2014 with 60-70% distal left main.  No intervention.  He denies chest pain.  - Continue ASA 81 - Continue statin.  Length of Stay: 3  Amy Clegg, NP  04/01/2023, 9:23 AM  Advanced Heart Failure Team Pager [phone] (M-F; 7a - 5p)  Please contact [facility] for night-coverage after hours (5p -7a ) and weekends on [email or web address]   Patient seen with NP, agree with the above note.   He had lorazepam last night for CIWA  protocol and remains sedated.  MAP stable on NE 7.  CVVH ongoing, pulling net negative 100 cc/hr, CVP 12 today.  Weight down.  HR 100s-120s in atrial fibrillation.   General: NAD Neck: JVP 10 cm, no thyromegaly or thyroid nodule.  Lungs: Clear to auscultation bilaterally with normal respiratory effort. CV: Nondisplaced PMI.  Heart tachy, irregular S1/S2, no S3/S4, no murmur. Trace ankle edema.   Abdomen: Soft, nontender, no hepatosplenomegaly, no distention.  Skin: Intact without lesions or rashes.  Neurologic: Alert and oriented x 3.  Psych: Normal affect. Extremities: No clubbing or cyanosis.  HEENT: Normal.   Primarily septic shock, remains on NE 7.   Volume status improved but still volume overloaded.  Will continue CVVH with UF 100 cc/hr today.   MSSA empyema with bloody output.  On cefazolin, no heparin gtt yet (getting [place] heparin).    Atrial fibrillation with RVR.  Will add amiodarone 400 mg bid to help with rate control.  Ultimately, would like to cardiovert and think he will need amiodarone to stay in rhythm.  No IV heparin yet with bloody CT output.   CRITICAL CARE Performed by: Marca Ancona  Total critical care time: 35 minutes  Critical care time was exclusive of separately billable procedures and treating other patients.  Critical care was necessary to treat or prevent imminent or life-threatening deterioration.  Critical care was time spent personally by me on the following activities: development of treatment plan with patient and/or surrogate as well as nursing, discussions with consultants, evaluation of patient's response to treatment, examination of patient, obtaining history from patient or surrogate, ordering and performing treatments and interventions, ordering and review of laboratory studies, ordering and review of radiographic studies, pulse oximetry and re-evaluation of patient's condition.  Marca Ancona 04/01/2023 10:56 AM

## 2023-04-01 NOTE — Progress Notes (Signed)
Nutrition Follow-up  DOCUMENTATION CODES:   Non-severe (moderate) malnutrition in context of acute illness/injury  INTERVENTION:   Tube Feeding via Cortrak: Vital 1.5 at 50 mL/hr Begin TF at rate of 20 ml/hr, titrate by 10 mL q 8 hours until goal rate of 50 ml/hr Pro-source TF20 60 mL TID TF at goal provide 2040 kcals, 141 g of protein and 912 mL of free water  Continue Regular Diet as tolerated  Continue Ensure Enlive po BID, each supplement provides 350 kcal and 20 grams of protein.  Continue Renal MVI   NUTRITION DIAGNOSIS:   Moderate Malnutrition related to acute illness as evidenced by moderate muscle depletion, mild fat depletion, energy intake < 75% for > 7 days.  Being addressed via TF   GOAL:   Patient will meet greater than or equal to 90% of their needs  Progressing  MONITOR:   Supplement acceptance, PO intake  REASON FOR ASSESSMENT:   Consult Assessment of nutrition requirement/status  ASSESSMENT:   Pt presented to ED w/ SOB and found to have AKI d/t ischemic ATN r/t septic shock. PMH: HTN, T2DM, CAD, A-fib. Has large R pleural effusion and small L pleural effusion. Chst tube in place and culture grew staph aureus. CRRT also initiated.  01/14 - Admitted 01/15 - CRRT initiated   Pt remains on CRRT. On CIWA Pt more lethargic today, did not eat breakfast. Plan for Cortrak tube today with TF initiation  Current wt 92.7 kg, down from 96.3 kg yesterday. Net negative 4.4 L since admission  Chest tubes with 730 mL out in 24 hours, 240 mL thus far today  +smear BM, otherwise last BM 1/13. May need to adjust bowel regimen pending bowel function post TF initiation  Labs: sodium 133 (L), potassium 5.0 (wdl), phosphorus 3.2 (wdl) Meds: thiamine, folic acid, renal MVI x 2, ss novolog   Diet Order:   Diet Order             Diet regular Room service appropriate? Yes; Fluid consistency: Thin  Diet effective now                   EDUCATION NEEDS:    Education needs have been addressed  Skin:  Skin Assessment: Reviewed RN Assessment  Last BM:  +smear today, otherwise no BM since 1/13  Height:   Ht Readings from Last 1 Encounters:  03/29/23 6\' 1"  (1.854 m)    Weight:   Wt Readings from Last 1 Encounters:  04/01/23 92.7 kg      BMI:  Body mass index is 26.96 kg/m.  Estimated Nutritional Needs:   Kcal:  2100-2300kcal  Protein:  130-140g  Fluid:  >2L/day   Romelle Starcher MS, RDN, LDN, CNSC Registered Dietitian 3 Clinical Nutrition RD Inpatient Contact Info in Amion

## 2023-04-01 NOTE — Progress Notes (Signed)
[facility] PROGRESS NOTE  Assessment/ Plan: Pt is a 83 y.o. yo male  with PMH significant for hypertension, type II DM, CAD, A-fib, CKD (f/b Dr. Glenna Fellows) was admitted with shortness of breath, seen as a consultation for the evaluation and management of acute kidney injury.   # Acute kidney injury on CKD IV b/l cr around 2.5-2.9: AKI likely ischemic ATN due to septic shock.  Kidney ultrasound with no hydronephrosis.  Refractory to diuretics and remained hypotensive therefore moved to ICU on 1/15. Initiated CRRT on 1/15 after placement of temporary HD catheter by PCCM.  Tolerating CRRT well with the help up Levophed.  Continue CRRT and monitor for any recovery Strict ins and out and close lab monitoring.  # Septic shock/staph bacteremia: Pressors per primary team.  Antibiotics per primary team  # Right pleural effusion status post right chest tube placement and drainage of fluid.  Plan per PCCM.   # Peripheral edema/fluid overload: Did not respond with IV diuretics and unable to use further diuresis because of persistent hypotension.  Started CRRT as discussed above.   # Acute hypoxic respiratory failure: Wean oxygen as able # Acute CHF # A-fib with RVR  Subjective: Tolerating CRRT with no issues.  -2.6 L.  Remains on norepinephrine.  No urine output Objective Vital signs in last 24 hours: Vitals:   04/01/23 0645 04/01/23 0655 04/01/23 0700 04/01/23 0800  BP: (!) 96/55  106/87 (!) 100/54  Pulse: (!) 103  (!) 116 (!) 111  Resp: (!) 22  (!) 26 (!) 21  Temp:  98.4 F (36.9 C)  98.7 F (37.1 C)  TempSrc:  Oral  Oral  SpO2: 100%  97% 96%  Weight:      Height:       Weight change: -3.6 kg  Intake/Output Summary (Last 24 hours) at 04/01/2023 0917 Last data filed at 04/01/2023 0800 Gross per 24 hour  Intake 995.48 ml  Output 3685.4 ml  Net -2689.92 ml       Labs: RENAL PANEL Recent Labs  Lab 03/29/23 0903 03/29/23 0928 03/29/23 1017 03/30/23 0251  03/31/23 0241 03/31/23 2108 04/01/23 0258  NA 136  --  136 134* 132* 133* 133*  K 4.8  --  4.7 4.9 4.8 5.2* 5.0  CL 97*  --  97* 97* 98 99 100  CO2 26  --   --  24 25 26 24   GLUCOSE 155*  --  156* 138* 176* 136* 127*  BUN 48*  --  48* 59* 45* 26* 23  CREATININE 2.83*  --  2.80* 3.79* 3.32* 2.51* 2.38*  CALCIUM 8.8*  --   --  8.3* 7.7* 8.3* 8.1*  MG  --  2.1  --  2.2 2.2  --  2.3  PHOS  --   --   --   --  3.8 2.7 3.2  ALBUMIN 2.8*  --   --  2.7* 2.5* 2.5* 2.5*    Liver Function Tests: Recent Labs  Lab 03/29/23 0903 03/29/23 1832 03/30/23 0251 03/31/23 0241 03/31/23 2108 04/01/23 0258  AST 28  --  24  --   --   --   ALT 24  --  23  --   --   --   ALKPHOS 84  --  68  --   --   --   BILITOT 1.9*  --  1.3*  --   --   --   PROT 6.8 6.2* 6.5  --   --   --  ALBUMIN 2.8*  --  2.7* 2.5* 2.5* 2.5*   No results for input(s): "LIPASE", "AMYLASE" in the last 168 hours. No results for input(s): "AMMONIA" in the last 168 hours. CBC: Recent Labs    03/29/23 0903 03/29/23 1017 03/30/23 0251 03/31/23 0241 04/01/23 0353  HGB 10.9* 11.6* 10.4* 10.0* 9.2*  MCV 100.3*  --  101.6* 101.9* 104.2*    Cardiac Enzymes: No results for input(s): "CKTOTAL", "CKMB", "CKMBINDEX", "TROPONINI" in the last 168 hours. CBG: Recent Labs  Lab 03/30/23 2312 03/31/23 1217 03/31/23 1758 03/31/23 2153 04/01/23 0649  GLUCAP 203* 139* 75 155* 121*    Iron Studies: No results for input(s): "IRON", "TIBC", "TRANSFERRIN", "FERRITIN" in the last 72 hours. Studies/Results: MR LUMBAR SPINE WO CONTRAST Result Date: 03/31/2023 CLINICAL DATA:  Low back pain, infection suspected, no prior imaging EXAM: MRI LUMBAR SPINE WITHOUT CONTRAST TECHNIQUE: Multiplanar, multisequence MR imaging of the lumbar spine was performed. No intravenous contrast was administered. COMPARISON:  None Available. FINDINGS: Segmentation: Transitional lumbosacral anatomy with partially lumbarized S1 segment. Alignment: Slight (grade 1)  stepwise retrolisthesis of L2 on L3 and L3 on L4. Otherwise, no substantial sagittal subluxation. Vertebrae: Mild degenerative/discogenic endplate signal changes at L2-L3. No specific evidence of discitis/osteomyelitis or suspicious bone lesion. Conus medullaris and cauda equina: Conus extends to the T12-L1 level. Conus and cauda equina appear normal. Paraspinal and other soft tissues: Unremarkable. Disc levels: T12-L1: No significant disc protrusion, foraminal stenosis, or canal stenosis. L1-L2: Mild disc bulge.  No significant stenosis. L2-L3: Mild disc bulge.  No significant stenosis. L3-L4: Mild disc bulge. Bilateral facet arthropathy. No significant stenosis. L4-L5: Mild disc bulge and bilateral facet arthropathy. Resulting mild left foraminal stenosis. Patent canal and right foramen. L5-S1: Mild disc bulging. Bilateral facet arthropathy. No significant stenosis. S1-S2: Transitional anatomy. Right eccentric endplate spurring without significant stenosis. IMPRESSION: 1. No specific evidence of discitis/osteomyelitis on noncontrast imaging. 2. At L4-L5, mild foraminal stenosis. Otherwise, lower lumbar degenerative change without significant stenosis. 3. Transitional lumbosacral anatomy with partially lumbarized S1 vertebral body. Correlation with radiographs is recommended prior to any operative intervention. Electronically Signed   By: Feliberto Harts M.D.   On: 03/31/2023 20:00   DG CHEST PORT 1 VIEW Result Date: 03/31/2023 CLINICAL DATA:  Chest tube in place. EXAM: PORTABLE CHEST 1 VIEW COMPARISON:  Chest radiograph dated 03/30/2023. FINDINGS: Right IJ catheter and right chest tube in similar position. No significant interval change in right pleural effusion with associated right lung atelectasis. No pneumothorax. Stable cardiac silhouette. No acute osseous pathology. IMPRESSION: No significant interval change. Electronically Signed   By: Elgie Collard M.D.   On: 03/31/2023 09:43   DG CHEST PORT 1  VIEW Result Date: 03/30/2023 CLINICAL DATA:  Line placement EXAM: PORTABLE CHEST 1 VIEW COMPARISON:  X-ray 03/29/2023 and older FINDINGS: New right IJ line with the tip along the central SVC above the right atrium. No pneumothorax. Enlarged cardiopericardial silhouette again seen with a large right pleural effusion and small left. Pigtail catheter in the right lung base. Associated right lung opacities. No pneumothorax. No edema. Overlapping cardiac leads. Film is under penetrated IMPRESSION: New right IJ line in place.  No pneumothorax Electronically Signed   By: Karen Kays M.D.   On: 03/30/2023 18:12    Medications: Infusions:   prismasol BGK 4/2.5 400 mL/hr at 03/31/23 2118    prismasol BGK 4/2.5 400 mL/hr at 03/31/23 2124    ceFAZolin (ANCEF) IV Stopped (03/31/23 2035)   heparin 10,000 units/ 20 mL infusion  syringe 500 Units/hr (03/31/23 1128)   norepinephrine (LEVOPHED) Adult infusion 7 mcg/min (04/01/23 0800)   prismasol BGK 4/2.5 1,500 mL/hr at 04/01/23 0830    Scheduled Medications:  (feeding supplement) PROSource Plus  30 mL Oral BID BM   aspirin  81 mg Oral Daily   atorvastatin  40 mg Oral Daily   Chlorhexidine Gluconate Cloth  6 each Topical Daily   feeding supplement  237 mL Oral TID BM   folic acid  1 mg Oral Daily   heparin injection (subcutaneous)  5,000 Units Subcutaneous Q8H   insulin aspart  0-5 Units Subcutaneous QHS   insulin aspart  0-9 Units Subcutaneous TID WC   multivitamin  1 tablet Oral BID   nicotine  21 mg Transdermal Daily   phenobarbital  64.8 mg Oral Q8H   Followed by   Melene Muller ON 04/02/2023] phenobarbital  32.4 mg Oral Q8H   sodium chloride flush  10 mL Intrapleural Q8H   sodium chloride flush  10 mL Intrapleural Q8H   thiamine  100 mg Oral Daily   Or   thiamine  100 mg Intravenous Daily    have reviewed scheduled and prn medications.  Physical Exam: General:NAD, comfortable Heart:tachycardic, no murmur Lungs: Right chest tube with pinkish  drainage, no increased work of breathing Abdomen:soft, Non-tender, non-distended Extremities:b/l leg edema+ Dialysis Access: Right IJ temporary HD catheter placed by PCCM on 1/15.  Bernestine Amass Garrell Flagg 04/01/2023,9:17 AM  LOS: 3 days

## 2023-04-01 NOTE — Procedures (Signed)
Pleural Fibrinolytic Administration Procedure Note  [facility]  [id number]  1940-06-11  Date:04/01/23  Time:10:33 AM   Provider Performing:Angella Montas E Eoin Willden   Procedure: Pleural Fibrinolysis Subsequent day (40981)  Indication(s) Fibrinolysis of complicated pleural effusion  Consent Risks of the procedure as well as the alternatives and risks of each were explained to the patient and/or caregiver.  Consent for the procedure was obtained.   Anesthesia None   Time Out Verified patient identification, verified procedure, site/side was marked, verified correct patient position, special equipment/implants available, medications/allergies/relevant history reviewed, required imaging and test results available.   Sterile Technique Hand hygiene, gloves   Procedure Description Existing pleural catheter was cleaned and accessed in sterile manner.  10mg  of tPA in 30cc of saline and 5mg  of dornase in 30cc of sterile water were injected into pleural space using existing pleural catheter at 1030. Catheter will be clamped for 1 hour and then placed back to suction.   Complications/Tolerance None; patient tolerated the procedure well.  EBL None   Specimen(s) None    Tessie Fass MSN, AGACNP-BC [facility] Pulmonary/Critical Care Medicine Amion for pager  04/01/2023, 10:33 AM

## 2023-04-01 NOTE — Progress Notes (Signed)
Physical Therapy Treatment Patient Details Name: Zachary Parrish MRN: [id number] DOB: 13-Jan-1941 Today's Date: 04/01/2023   History of Present Illness Zachary Parrish is an 83 year old gentleman admitted 1/14 presenting with greater than 2 months of progressive shortness of breath and leg edema. Over the last 2 weeks he has felt worse such that he is short of breath even at rest  He has been sleeping sitting upright sometimes in his car to prop him up enough. Pt also with afib. Pt with chest tube placed 1/14 due to pleural effusion. Pt  found to have AKI d/t ischemic ATN r/t septic shock with cultures growing staph aureus at chest tube 1/16. Pt transferred to ICU and CRRT initiated 1/16. PMH: A-fib many years ago, status post ablation and not recurrent so not on anticoagulation, CKD 3B    PT Comments  Pt has regressed since initial physical therapy evaluation. Currently pt is total Assist for bed mobility and was unable to progress to standing. Pt was able to sit EOB for ~10 min prior to fatiguing. Due to pt current functional status, home set up and available assistance at home recommending skilled physical therapy services < 3 hours/day in order to address strength, balance and functional mobility to decrease risk for falls, injury, immobility, skin break down and re-hospitalization.      If plan is discharge home, recommend the following: Two people to help with walking and/or transfers;Assistance with cooking/housework;Assist for transportation;Help with stairs or ramp for entrance   Can travel by private vehicle     No  Equipment Recommendations  Other (comment) (defer to post acute)       Precautions / Restrictions Precautions Precautions: Fall;Other (comment) Precaution Comments: watch BP and O2 Restrictions Weight Bearing Restrictions Per Provider Order: No Other Position/Activity Restrictions: R chest tube     Mobility  Bed Mobility Overal bed mobility: Needs Assistance Bed Mobility:  Rolling, Supine to Sit, Sit to Supine Rolling: Max assist, Total assist, +2 for physical assistance, +2 for safety/equipment   Supine to sit: Max assist, Total assist, +2 for physical assistance, +2 for safety/equipment, HOB elevated Sit to supine: Max assist, Total assist, +2 for physical assistance, +2 for safety/equipment   General bed mobility comments: Pt making good attempt to assist with bed mobility but largely Total assist +2 to +3.        Balance Overall balance assessment: Needs assistance Sitting-balance support: Single extremity supported, Bilateral upper extremity supported, Feet supported Sitting balance-Leahy Scale: Poor Sitting balance - Comments: Pt requiring Min to Max assist to maintain sitting balance during ADLs.            Cognition Arousal: Lethargic Behavior During Therapy: Flat affect Overall Cognitive Status: Impaired/Different from baseline Area of Impairment: Safety/judgement       Following Commands: Follows one step commands with increased time Safety/Judgement: Decreased awareness of safety     General Comments: Oriented to self, wife, and being in the hospital.           General Comments General comments (skin integrity, edema, etc.): HR between 100-120 bpm and O2 sats > 94 % on 2L O2 via [place]. Pt reports mild dizziness sitting EOB from supine to sitting with BP stable. Pt's wife and RN present during session.      Pertinent Vitals/Pain Pain Assessment Pain Assessment: Faces Faces Pain Scale: Hurts even more Facial Expression: Relaxed, neutral Body Movements: Absence of movements Muscle Tension: Relaxed Compliance with ventilator (intubated pts.): N/A Vocalization (extubated pts.): Talking in normal  tone or no sound CPOT Total: 0 Pain Location: R flank and R side of abdomen around Pain Descriptors / Indicators: Aching, Discomfort, Grimacing, Sore Pain Intervention(s): Monitored during session, Limited activity within patient's  tolerance, Repositioned    Home Living Family/patient expects to be discharged to:: Private residence Living Arrangements: Spouse/significant other Available Help at Discharge: Family;Available 24 hours/day;Neighbor;Available PRN/intermittently Type of Home: House Home Access: Stairs to enter Entrance Stairs-Rails: Right Entrance Stairs-Number of Steps: 3 (from garage)   Home Layout: One level;Laundry or work area in basement;Able to live on main level with bedroom/bathroom (stays on main level) Home Equipment: Agricultural consultant (2 wheels);Cane - quad;Wheelchair - manual;Hand held Engineer, civil (consulting) - single point          PT Goals (current goals can now be found in the care plan section) Acute Rehab PT Goals Patient Stated Goal: to get stronger and be able to breathe PT Goal Formulation: With patient Time For Goal Achievement: 04/13/23 Potential to Achieve Goals: Good Progress towards PT goals: Progressing toward goals    Frequency    Min 1X/week      PT Plan  Discharged recommendations updated.     Co-evaluation PT/OT/SLP Co-Evaluation/Treatment: Yes Reason for Co-Treatment: Complexity of the patient's impairments (multi-system involvement);Necessary to address cognition/behavior during functional activity;For patient/therapist safety;To address functional/ADL transfers PT goals addressed during session: Mobility/safety with mobility;Balance OT goals addressed during session: Other (comment) (cognition)      AM-PAC PT "6 Clicks" Mobility   Outcome Measure  Help needed turning from your back to your side while in a flat bed without using bedrails?: Total Help needed moving from lying on your back to sitting on the side of a flat bed without using bedrails?: Total Help needed moving to and from a bed to a chair (including a wheelchair)?: Total Help needed standing up from a chair using your arms (e.g., wheelchair or bedside chair)?: Total Help needed to walk in hospital  room?: Total Help needed climbing 3-5 steps with a railing? : Total 6 Click Score: 6    End of Session Equipment Utilized During Treatment: Gait belt;Oxygen Activity Tolerance: Patient limited by fatigue Patient left: with call bell/phone within reach;with family/visitor present;in bed Nurse Communication: Mobility status PT Visit Diagnosis: Unsteadiness on feet (R26.81);Muscle weakness (generalized) (M62.81);Pain     Time: 1230-1257 PT Time Calculation (min) (ACUTE ONLY): 27 min  Charges:    $Therapeutic Activity: 8-22 mins PT General Charges $$ ACUTE PT VISIT: 1 Visit                     Harrel Carina, DPT, CLT  Acute Rehabilitation Services Office: [phone] (Secure chat preferred)    Claudia Desanctis 04/01/2023, 3:59 PM

## 2023-04-01 NOTE — Progress Notes (Signed)
[facility]   Reason for visit: Follow-up on empyema  Interval History: WBC of 9.7, remains afebrile.  He is not responding after receiving 2 g of Ativan overnight.  Physical Exam: Constitutional:  Vitals:   04/01/23 0700 04/01/23 0800  BP: 106/87 (!) 100/54  Pulse: (!) 116 (!) 111  Resp: (!) 26 (!) 21  Temp:  98.7 F (37.1 C)  SpO2: 97% 96%   patient appears in NAD Respiratory: Normal respiratory effort  Review of Systems: Constitutional: Negative for fever or chills  Lab Results  Component Value Date   WBC 9.7 04/01/2023   HGB 9.2 (L) 04/01/2023   HCT 29.8 (L) 04/01/2023   MCV 104.2 (H) 04/01/2023   PLT 162 04/01/2023    Lab Results  Component Value Date   CREATININE 2.38 (H) 04/01/2023   BUN 23 04/01/2023   NA 133 (L) 04/01/2023   K 5.0 04/01/2023   CL 100 04/01/2023   CO2 24 04/01/2023    Lab Results  Component Value Date   ALT 23 03/30/2023   AST 24 03/30/2023   ALKPHOS 68 03/30/2023     Microbiology: Recent Results (from the past 240 hours)  Body fluid culture w Gram Stain     Status: None   Collection Time: 03/29/23  4:24 PM   Specimen: Pleura; Body Fluid  Result Value Ref Range Status   Specimen Description PLEURAL  Final   Special Requests RIGHT  Final   Gram Stain   Final    FEW WBC PRESENT, PREDOMINANTLY PMN RARE GRAM POSITIVE COCCI CRITICAL RESULT CALLED TO, READ BACK BY AND VERIFIED WITH: RN MANDY BURNETTE ON 03/29/23 @ 1958 BY DRT Performed at [facility], [address], [place], [place] [address]    Culture MODERATE STAPHYLOCOCCUS AUREUS  Final   Report Status 03/31/2023 FINAL  Final   Organism ID, Bacteria STAPHYLOCOCCUS AUREUS  Final      Susceptibility   Staphylococcus aureus - MIC*    CIPROFLOXACIN <=0.5 SENSITIVE Sensitive     ERYTHROMYCIN <=0.25 SENSITIVE Sensitive     GENTAMICIN <=0.5 SENSITIVE Sensitive     OXACILLIN 0.5 SENSITIVE Sensitive     TETRACYCLINE <=1 SENSITIVE Sensitive      VANCOMYCIN 1 SENSITIVE Sensitive     TRIMETH/SULFA <=10 SENSITIVE Sensitive     CLINDAMYCIN <=0.25 SENSITIVE Sensitive     RIFAMPIN <=0.5 SENSITIVE Sensitive     Inducible Clindamycin NEGATIVE Sensitive     LINEZOLID 2 SENSITIVE Sensitive     * MODERATE STAPHYLOCOCCUS AUREUS  Culture, blood (Routine X 2) w Reflex to ID Panel     Status: None (Preliminary result)   Collection Time: 03/30/23  1:02 PM   Specimen: BLOOD LEFT HAND  Result Value Ref Range Status   Specimen Description BLOOD LEFT HAND  Final   Special Requests   Final    BOTTLES DRAWN AEROBIC AND ANAEROBIC Blood Culture results may not be optimal due to an inadequate volume of blood received in culture bottles   Culture   Final    NO GROWTH 2 DAYS Performed at [facility], [address] [place], [place] [address]    Report Status PENDING  Incomplete  Culture, blood (Routine X 2) w Reflex to ID Panel     Status: None (Preliminary result)   Collection Time: 03/30/23  1:02 PM   Specimen: BLOOD RIGHT HAND  Result Value Ref Range Status   Specimen Description BLOOD RIGHT HAND  Final   Special Requests   Final    BOTTLES DRAWN AEROBIC AND ANAEROBIC Blood Culture results may not be optimal due to an inadequate volume of blood received in culture bottles   Culture   Final    NO GROWTH 2 DAYS Performed at [facility], [address], [place], [place] [address]    Report Status PENDING  Incomplete  MRSA Next Gen by PCR, Nasal     Status: None   Collection Time: 03/30/23  6:45 PM   Specimen: Nasal Mucosa; Nasal Swab  Result Value Ref Range Status   MRSA by PCR Next Gen NOT DETECTED NOT DETECTED Final    Comment: (NOTE) The GeneXpert MRSA Assay (FDA approved for NASAL specimens only), is one component of a comprehensive MRSA colonization surveillance program. It is not intended to diagnose MRSA infection nor to guide or monitor treatment for MRSA infections. Test performance is not FDA approved in patients less  than 18 years old. Performed at [facility], [address] [place], [place] [address]     Impression/Plan:  1.  Empyema.  MSSA in culture and now on cefazolin.  Will continue with this for a prolonged period pending outcome.  2.  Back pain.  Lumbar MRI without any concerns for osteomyelitis or discitis.  3.  Shock.  MSSA empyema likely septic shock though no positive blood cultures.  Continues on some pressor support.  Dr. Renold Don available over the weekend if needed otherwise ID team will follow-up on Monday.

## 2023-04-01 NOTE — Progress Notes (Signed)
NAME:  Zachary Parrish, MRN:  [id number], DOB:  Aug 10, 1940, LOS: 3 ADMISSION DATE:  03/29/2023, CONSULTATION DATE:  1/14 REFERRING MD:  Miquel Dunn, CHIEF COMPLAINT:  EFFUSION    History of Present Illness:  83 year old male who presents to the emergency room with chief complaint of approximately 2 and half months of progressive exertional dyspnea.  Initially noted shortness of breath back in early November primarily with activity, around Thanksgiving time reporting fairly significant increased work of breathing to the point that family noticed, of note about 1 month prior to that certain to notice some mild increase lower extremity edema.  By Christmas time his shortness of breath had gotten to the point of resting dyspnea, he was also transition to 4 pillow orthopnea and at times would actually go out and sleep in his car to be in the upright position.  He has had productive cough with clear mucus, no chest pain, no palpitations.  No sick exposures.  His legs have become more painful over the last week, and he presents to the emergency room primarily because he can only speak in 2-3 word phrases and was significantly weak and orthopneic.  In the ER Evaluation Was initiated this demonstrated the following: Serum creatinine 2.83 is not far from his baseline, glucose 155 white blood cell count 14 troponin I of 23, a chest x-ray was obtained this demonstrated a large right pleural effusion because of this a CT of chest was obtained to further evaluate.  CT of chest demonstrated a large right pleural effusion with pleural thickening and rind, some basilar consolidated atelectasis which appeared rounded in nature, and a very small left pleural effusion he also had a 2.5 cm cystic lesion on his right thyroid.  Because of the pleural effusion and dyspnea pulmonary was asked to evaluate.  Pertinent  Medical History  Atrial fibrillation not on anticoagulation but has had prior cardioversion, carotid stenosis, CKD stage IIIb,  hyperlipidemia, coronary artery disease, type 2 diabetes, hypertension   Significant Hospital Events: Including procedures, antibiotic start and stop dates in addition to other pertinent events   1/14 admitted with progressive subacute dyspnea and large chronic appearing pleural effusion with rind on the right.  Thoracostomy tube placed in the emergency room  Interim History / Subjective:  Delirious on phenobarb and PRN ativan    prismasol BGK 4/2.5 400 mL/hr at 03/31/23 2118    prismasol BGK 4/2.5 400 mL/hr at 03/31/23 2124    ceFAZolin (ANCEF) IV Stopped (03/31/23 2035)   heparin 10,000 units/ 20 mL infusion syringe 500 Units/hr (03/31/23 1128)   norepinephrine (LEVOPHED) Adult infusion 7 mcg/min (04/01/23 0700)   prismasol BGK 4/2.5 1,500 mL/hr at 04/01/23 0515     Objective   Blood pressure (!) 96/55, pulse (!) 116, temperature 98.4 F (36.9 C), temperature source Oral, resp. rate (!) 26, height 6\' 1"  (1.854 m), weight 92.7 kg, SpO2 97%. CVP:  [13 mmHg] 13 mmHg      Intake/Output Summary (Last 24 hours) at 04/01/2023 0736 Last data filed at 04/01/2023 0700 Gross per 24 hour  Intake 1113.9 ml  Output 3845.6 ml  Net -2731.7 ml   Filed Weights   03/30/23 0200 03/31/23 0613 04/01/23 0515  Weight: 94.5 kg 96.3 kg 92.7 kg    Examination: Somnolent but moves to command RASS -1 Breath sounds diminished on R Does have leak on R, going to reinforce dressing and see if it's real Heart irregular, ext warm Serosanguinous output in atrium No edema  CBC stable  BMP stable on CRRT CXR pending  Resolved Hospital Problem list     Assessment & Plan:  Acute hypoxic respiratory failure due to right-sided empyema Severe sepsis with septic shock due to staph aureus empyema status post chest tube placement, not POA Small ex vacuo pneumothorax Acute right-sided systolic heart failure Chronic atrial fibrillation AKI on CKD stage IV due to cardiorenal syndrome versus septic  ATN Thyroid lesion Anemia and thrombocytopenia of chronic disease Urinary retention Hypervolemic hyponatremia/Hypocalcemia DM type 2 Back pain- MRI neg Acute alcohol w/d delirium- +/- ICU delirium Tobacco dependence  - Pigtail to -20, check CXR - Consider additional lytics - Phenobarb taper with PRN ativan - If not eating later this am will place cortrak - Levophed for MAP 65 - CRRT pull as tolerated by BP - AC once done with thrombolytics in pleural space - Cefazolin with duration TBD - Start nicotine patch  Best Practice (right click and "Reselect all SmartList Selections" daily)  Diet/type: Regular consistency DVT prophylaxis: heparin dvt ppx, full dose once done lytics GI prophylaxis: N/A Lines: HD catheter, and yes and it is still needed Foley:  Yes, and it is still needed Code Status:  full code Last date of multidisciplinary goals of care discussion: 1/16: wife updated at bedside  31 min cc time Myrla Halsted MD PCCM [facility] See Amion for pager If no response to pager, please call [phone] until 7pm After 7pm, Please call E-link [phone]

## 2023-04-02 DIAGNOSIS — J869 Pyothorax without fistula: Secondary | ICD-10-CM | POA: Diagnosis not present

## 2023-04-02 DIAGNOSIS — I4819 Other persistent atrial fibrillation: Secondary | ICD-10-CM

## 2023-04-02 LAB — CBC
HCT: 27.5 % — ABNORMAL LOW (ref 39.0–52.0)
HCT: 27.4 % — ABNORMAL LOW (ref 39.0–52.0)
Hemoglobin: 8.7 g/dL — ABNORMAL LOW (ref 13.0–17.0)
Hemoglobin: 8.8 g/dL — ABNORMAL LOW (ref 13.0–17.0)
MCH: 32.2 pg (ref 26.0–34.0)
MCH: 32.6 pg (ref 26.0–34.0)
MCHC: 31.6 g/dL (ref 30.0–36.0)
MCHC: 32.1 g/dL (ref 30.0–36.0)
MCV: 101.9 fL — ABNORMAL HIGH (ref 80.0–100.0)
MCV: 101.5 fL — ABNORMAL HIGH (ref 80.0–100.0)
Platelets: 149 10*3/uL — ABNORMAL LOW (ref 150–400)
Platelets: 164 10*3/uL (ref 150–400)
RBC: 2.7 MIL/uL — ABNORMAL LOW (ref 4.22–5.81)
RBC: 2.7 MIL/uL — ABNORMAL LOW (ref 4.22–5.81)
RDW: 17 % — ABNORMAL HIGH (ref 11.5–15.5)
RDW: 17.1 % — ABNORMAL HIGH (ref 11.5–15.5)
WBC: 8.6 10*3/uL (ref 4.0–10.5)
WBC: 10.2 10*3/uL (ref 4.0–10.5)
nRBC: 0 % (ref 0.0–0.2)
nRBC: 0 % (ref 0.0–0.2)

## 2023-04-02 LAB — GLUCOSE, CAPILLARY
Glucose-Capillary: 166 mg/dL — ABNORMAL HIGH (ref 70–99)
Glucose-Capillary: 167 mg/dL — ABNORMAL HIGH (ref 70–99)
Glucose-Capillary: 189 mg/dL — ABNORMAL HIGH (ref 70–99)
Glucose-Capillary: 153 mg/dL — ABNORMAL HIGH (ref 70–99)
Glucose-Capillary: 152 mg/dL — ABNORMAL HIGH (ref 70–99)

## 2023-04-02 LAB — RENAL FUNCTION PANEL
Albumin: 2.2 g/dL — ABNORMAL LOW (ref 3.5–5.0)
Albumin: 2.2 g/dL — ABNORMAL LOW (ref 3.5–5.0)
Anion gap: 6 (ref 5–15)
Anion gap: 7 (ref 5–15)
BUN: 24 mg/dL — ABNORMAL HIGH (ref 8–23)
BUN: 24 mg/dL — ABNORMAL HIGH (ref 8–23)
CO2: 26 mmol/L (ref 22–32)
CO2: 27 mmol/L (ref 22–32)
Calcium: 8 mg/dL — ABNORMAL LOW (ref 8.9–10.3)
Calcium: 7.8 mg/dL — ABNORMAL LOW (ref 8.9–10.3)
Chloride: 102 mmol/L (ref 98–111)
Chloride: 101 mmol/L (ref 98–111)
Creatinine, Ser: 1.98 mg/dL — ABNORMAL HIGH (ref 0.61–1.24)
Creatinine, Ser: 1.84 mg/dL — ABNORMAL HIGH (ref 0.61–1.24)
GFR, Estimated: 33 mL/min — ABNORMAL LOW
GFR, Estimated: 36 mL/min — ABNORMAL LOW (ref >60)
Glucose, Bld: 203 mg/dL — ABNORMAL HIGH (ref 70–99)
Glucose, Bld: 196 mg/dL — ABNORMAL HIGH (ref 70–99)
Phosphorus: 1.8 mg/dL — ABNORMAL LOW (ref 2.5–4.6)
Phosphorus: 3 mg/dL (ref 2.5–4.6)
Potassium: 5.1 mmol/L (ref 3.5–5.1)
Potassium: 4.4 mmol/L (ref 3.5–5.1)
Sodium: 134 mmol/L — ABNORMAL LOW (ref 135–145)
Sodium: 135 mmol/L (ref 135–145)

## 2023-04-02 LAB — COOXEMETRY PANEL
Carboxyhemoglobin: 2.6 % — ABNORMAL HIGH (ref 0.5–1.5)
Methemoglobin: 1 % (ref 0.0–1.5)
O2 Saturation: 74.5 %
Total hemoglobin: 8.8 g/dL — ABNORMAL LOW (ref 12.0–16.0)

## 2023-04-02 LAB — MAGNESIUM
Magnesium: 2.5 mg/dL — ABNORMAL HIGH (ref 1.7–2.4)
Magnesium: 2.5 mg/dL — ABNORMAL HIGH (ref 1.7–2.4)

## 2023-04-02 LAB — APTT: aPTT: 54 s — ABNORMAL HIGH (ref 24–36)

## 2023-04-02 LAB — PHOSPHORUS: Phosphorus: 3 mg/dL (ref 2.5–4.6)

## 2023-04-02 MED ORDER — MIDODRINE HCL 5 MG PO TABS
10.0000 mg | ORAL_TABLET | Freq: Three times a day (TID) | ORAL | Status: DC
  Administered 2023-04-02 – 2023-04-05 (×9): 10 mg via ORAL
  Filled 2023-04-02 (×10): qty 2

## 2023-04-02 MED ORDER — SODIUM PHOSPHATES 45 MMOLE/15ML IV SOLN
30.0000 mmol | Freq: Once | INTRAVENOUS | Status: AC
  Administered 2023-04-02: 30 mmol via INTRAVENOUS
  Filled 2023-04-02: qty 10

## 2023-04-02 MED ORDER — STERILE WATER FOR INJECTION IJ SOLN
5.0000 mg | Freq: Once | RESPIRATORY_TRACT | Status: AC
  Administered 2023-04-02: 5 mg via INTRAPLEURAL
  Filled 2023-04-02: qty 5

## 2023-04-02 MED ORDER — SODIUM CHLORIDE (PF) 0.9 % IJ SOLN
10.0000 mg | Freq: Once | INTRAMUSCULAR | Status: AC
  Administered 2023-04-02: 10 mg via INTRAPLEURAL
  Filled 2023-04-02: qty 10

## 2023-04-02 NOTE — Progress Notes (Signed)
Rounding Note    Patient Name: Zachary Parrish Date of Encounter: 04/02/2023  [facility] Cardiologist: Rollene Rotunda, MD   Subjective   No CP or dyspnea  Inpatient Medications    Scheduled Meds:  amiodarone  400 mg Oral BID   aspirin  81 mg Oral Daily   atorvastatin  40 mg Oral Daily   Chlorhexidine Gluconate Cloth  6 each Topical Daily   feeding supplement  237 mL Oral TID BM   feeding supplement (PROSource TF20)  60 mL Per Tube TID   folic acid  1 mg Oral Daily   heparin injection (subcutaneous)  5,000 Units Subcutaneous Q8H   insulin aspart  0-5 Units Subcutaneous QHS   insulin aspart  0-9 Units Subcutaneous TID WC   lidocaine  1 patch Transdermal Q24H   multivitamin  1 tablet Oral BID   nicotine  21 mg Transdermal Daily   phenobarbital  64.8 mg Oral Q8H   Followed by   phenobarbital  32.4 mg Oral Q8H   sodium chloride flush  10 mL Intrapleural Q8H   sodium chloride flush  10 mL Intrapleural Q8H   thiamine  100 mg Oral Daily   Or   thiamine  100 mg Intravenous Daily   Continuous Infusions:   prismasol BGK 4/2.5 400 mL/hr at 04/01/23 2104    prismasol BGK 4/2.5 400 mL/hr at 04/01/23 2105    ceFAZolin (ANCEF) IV Stopped (04/01/23 2141)   feeding supplement (VITAL 1.5 CAL) 40 mL/hr at 04/02/23 0730   heparin 10,000 units/ 20 mL infusion syringe 500 Units/hr (04/02/23 0456)   norepinephrine (LEVOPHED) Adult infusion 8 mcg/min (04/02/23 0700)   prismasol BGK 4/2.5 1,500 mL/hr at 04/02/23 0620   sodium PHOSPHATE IVPB (in mmol)     PRN Meds: acetaminophen **OR** acetaminophen, heparin, LORazepam **OR** LORazepam, oxyCODONE, phenol   Vital Signs    Vitals:   04/02/23 0615 04/02/23 0630 04/02/23 0645 04/02/23 0700  BP: 113/63 (!) 111/59 (!) 92/57   Pulse: (!) 109 (!) 102 (!) 104   Resp: (!) 27 (!) 42 (!) 21   Temp:    98.3 F (36.8 C)  TempSrc:    Oral  SpO2: 98% 95% 96%   Weight:      Height:        Intake/Output Summary (Last 24 hours) at  04/02/2023 0755 Last data filed at 04/02/2023 0700 Gross per 24 hour  Intake 1134.53 ml  Output 3993 ml  Net -2858.47 ml      04/02/2023    3:05 AM 04/01/2023    5:15 AM 03/31/2023    6:13 AM  Last 3 Weights  Weight (lbs) 200 lb 6.4 oz 204 lb 5.9 oz 212 lb 4.9 oz  Weight (kg) 90.9 kg 92.7 kg 96.3 kg      Telemetry    Atrial fibrillation rate controlled - Personally Reviewed  Physical Exam   GEN: No acute distress.   Neck: No JVD Cardiac: Irregular Respiratory: Clear to auscultation bilaterally. GI: Soft, nontender, non-distended  MS: No edema Neuro:  Nonfocal  Psych: Normal affect   Labs    High Sensitivity Troponin:   Recent Labs  Lab 03/29/23 0903 03/29/23 1130  TROPONINIHS 23* 23*     Chemistry Recent Labs  Lab 03/29/23 0903 03/29/23 0928 03/29/23 1832 03/30/23 0251 03/31/23 0241 04/01/23 0258 04/01/23 1615 04/02/23 0609  NA 136   < >  --  134*   < > 133* 132* 134*  K 4.8   < >  --  4.9   < > 5.0 4.9 5.1  CL 97*   < >  --  97*   < > 100 98 102  CO2 26  --   --  24   < > 24 24 26   GLUCOSE 155*   < >  --  138*   < > 127* 112* 203*  BUN 48*   < >  --  59*   < > 23 20 24*  CREATININE 2.83*   < >  --  3.79*   < > 2.38* 2.05* 1.98*  CALCIUM 8.8*  --   --  8.3*   < > 8.1* 8.2* 8.0*  MG  --    < >  --  2.2   < > 2.3 2.4 2.5*  PROT 6.8  --  6.2* 6.5  --   --   --   --   ALBUMIN 2.8*  --   --  2.7*   < > 2.5* 2.4* 2.2*  AST 28  --   --  24  --   --   --   --   ALT 24  --   --  23  --   --   --   --   ALKPHOS 84  --   --  68  --   --   --   --   BILITOT 1.9*  --   --  1.3*  --   --   --   --   GFRNONAA 22*  --   --  15*   < > 27* 32* 33*  ANIONGAP 13  --   --  13   < > 9 10 6    < > = values in this interval not displayed.     Hematology Recent Labs  Lab 04/01/23 0353 04/01/23 1615 04/02/23 0614  WBC 9.7 8.9 8.6  RBC 2.86* 2.82* 2.70*  HGB 9.2* 9.2* 8.7*  HCT 29.8* 29.3* 27.5*  MCV 104.2* 103.9* 101.9*  MCH 32.2 32.6 32.2  MCHC 30.9 31.4 31.6   RDW 17.2* 17.1* 17.0*  PLT 162 136* 149*   Thyroid  Recent Labs  Lab 03/29/23 1832  TSH 3.279    BNP Recent Labs  Lab 03/30/23 0251  BNP 346.8*      Radiology    DG Abd Portable 1V Result Date: 04/01/2023 CLINICAL DATA:  [id number] Encounter for feeding tube placement [id number] EXAM: PORTABLE ABDOMEN - 1 VIEW COMPARISON:  None Available. FINDINGS: Limited radiograph of the lower chest and upper abdomen was obtained for the purposes of enteric tube localization. Enteric tube is seen coursing below the diaphragm with distal tipterminating within the expected location of the gastric body or fundus. A right basilar chest tube is evident. IMPRESSION: Enteric tube terminates within the expected location of the gastric body or fundus. Electronically Signed   By: Duanne Guess D.O.   On: 04/01/2023 16:12   DG Chest Port 1 View Result Date: 04/01/2023 CLINICAL DATA:  Chest tube in place. EXAM: PORTABLE CHEST 1 VIEW COMPARISON:  March 31, 2023. FINDINGS: Stable cardiomediastinal silhouette. Right internal jugular catheter is unchanged. Right-sided chest tube is noted. Stable right pleural effusion is noted with associated atelectasis. Probable minimal left basilar atelectasis is noted. Bony thorax is unremarkable. IMPRESSION: Stable right-sided chest tube with stable right pleural effusion and associated right basilar atelectasis. Electronically Signed   By: Lupita Raider M.D.   On: 04/01/2023 11:57   MR LUMBAR SPINE WO  CONTRAST Result Date: 03/31/2023 CLINICAL DATA:  Low back pain, infection suspected, no prior imaging EXAM: MRI LUMBAR SPINE WITHOUT CONTRAST TECHNIQUE: Multiplanar, multisequence MR imaging of the lumbar spine was performed. No intravenous contrast was administered. COMPARISON:  None Available. FINDINGS: Segmentation: Transitional lumbosacral anatomy with partially lumbarized S1 segment. Alignment: Slight (grade 1) stepwise retrolisthesis of L2 on L3 and L3 on L4. Otherwise, no  substantial sagittal subluxation. Vertebrae: Mild degenerative/discogenic endplate signal changes at L2-L3. No specific evidence of discitis/osteomyelitis or suspicious bone lesion. Conus medullaris and cauda equina: Conus extends to the T12-L1 level. Conus and cauda equina appear normal. Paraspinal and other soft tissues: Unremarkable. Disc levels: T12-L1: No significant disc protrusion, foraminal stenosis, or canal stenosis. L1-L2: Mild disc bulge.  No significant stenosis. L2-L3: Mild disc bulge.  No significant stenosis. L3-L4: Mild disc bulge. Bilateral facet arthropathy. No significant stenosis. L4-L5: Mild disc bulge and bilateral facet arthropathy. Resulting mild left foraminal stenosis. Patent canal and right foramen. L5-S1: Mild disc bulging. Bilateral facet arthropathy. No significant stenosis. S1-S2: Transitional anatomy. Right eccentric endplate spurring without significant stenosis. IMPRESSION: 1. No specific evidence of discitis/osteomyelitis on noncontrast imaging. 2. At L4-L5, mild foraminal stenosis. Otherwise, lower lumbar degenerative change without significant stenosis. 3. Transitional lumbosacral anatomy with partially lumbarized S1 vertebral body. Correlation with radiographs is recommended prior to any operative intervention. Electronically Signed   By: Feliberto Harts M.D.   On: 03/31/2023 20:00    Patient Profile     83 y.o. male with past medical history of paroxysmal atrial fibrillation, DRESS syndrome, chronic kidney disease, diabetes mellitus, coronary artery disease, heart failure reduced ejection fraction with recovered EF (felt to be septic cardiomyopathy) alcohol abuse admitted with empyema, acute on chronic kidney injury and atrial fibrillation with rapid ventricular response.  Echocardiogram this admission shows ejection fraction 50 to 55%, moderate left ventricular hypertrophy, moderate RV dysfunction, mild right ventricular enlargement, moderate left atrial enlargement,  mild right atrial enlargement.  Assessment & Plan    1 persistent atrial fibrillation-patient's heart rate is upper normal to mildly elevated; will continue amiodarone at present dose.  He is presently not anticoagulated as he bloody output from his chest tube.  This will ultimately need to be resumed.  2 empyema with MSSA-Chest tube in place.  3 shock-felt secondary to sepsis with MSSA empyema.  Continue norepinephrine and wean as tolerated.  4 acute on chronic diastolic congestive heart failure-he appears to be euvolemic today.  Volume is being managed with CVVH.  5 acute on chronic stage IV kidney disease-ATN is suspected to be due to recent shock.  Continue CVVH.  Nephrology following.  6 history of coronary artery disease with 60 to 70% distal left main by catheterization 2014.  He is not having chest pain.  Continue aspirin and statin.  For questions or updates, please contact [facility] Please consult [email or web address] for contact info under        Signed, Olga Millers, MD  04/02/2023, 7:55 AM

## 2023-04-02 NOTE — Procedures (Signed)
Pleural Fibrinolytic Administration Procedure Note  Zachary Parrish  [id number]  [birth year]  Date:04/02/23  Time:2:24 PM   Provider Performing:Payeton Germani C Katrinka Blazing   Procedure: Pleural Fibrinolysis Subsequent day [phone])  Indication(s) Fibrinolysis of complicated pleural effusion  Consent Risks of the procedure as well as the alternatives and risks of each were explained to the patient and/or caregiver.  Consent for the procedure was obtained.   Anesthesia None   Time Out Verified patient identification, verified procedure, site/side was marked, verified correct patient position, special equipment/implants available, medications/allergies/relevant history reviewed, required imaging and test results available.   Sterile Technique Hand hygiene, gloves   Procedure Description Existing pleural catheter was cleaned and accessed in sterile manner.  10mg  of tPA in 30cc of saline and 5mg  of dornase in 30cc of sterile water were injected into pleural space using existing pleural catheter.  Catheter will be clamped for 1 hour and then placed back to suction.   Complications/Tolerance None; patient tolerated the procedure well.  EBL None   Specimen(s) None

## 2023-04-02 NOTE — Procedures (Addendum)
I saw and evaluated the patient on CRRT.  I reviewed the last 24 hours events.  Adjustments to CRRT prescription are made as needed.  Replacing phos.  Volume status improved.  Lower UF today.  Probably run closer to even   [facility] Weights   03/31/23 0613 04/01/23 0515 04/02/23 0305  Weight: 96.3 kg 92.7 kg 90.9 kg    Recent Labs  Lab 04/02/23 0609  NA 134*  K 5.1  CL 102  CO2 26  GLUCOSE 203*  BUN 24*  CREATININE 1.98*  CALCIUM 8.0*  PHOS 1.8*    Recent Labs  Lab 03/30/23 0251 03/31/23 0241 04/01/23 0353 04/01/23 1615 04/02/23 0614  WBC 11.2*   < > 9.7 8.9 8.6  NEUTROABS 9.7*  --   --   --   --   HGB 10.4*   < > 9.2* 9.2* 8.7*  HCT 32.6*   < > 29.8* 29.3* 27.5*  MCV 101.6*   < > 104.2* 103.9* 101.9*  PLT 135*   < > 162 136* 149*   < > = values in this interval not displayed.    Scheduled Meds:  amiodarone  400 mg Oral BID   aspirin  81 mg Oral Daily   atorvastatin  40 mg Oral Daily   Chlorhexidine Gluconate Cloth  6 each Topical Daily   feeding supplement  237 mL Oral TID BM   feeding supplement (PROSource TF20)  60 mL Per Tube TID   folic acid  1 mg Oral Daily   heparin injection (subcutaneous)  5,000 Units Subcutaneous Q8H   insulin aspart  0-5 Units Subcutaneous QHS   insulin aspart  0-9 Units Subcutaneous TID WC   lidocaine  1 patch Transdermal Q24H   multivitamin  1 tablet Oral BID   nicotine  21 mg Transdermal Daily   phenobarbital  64.8 mg Oral Q8H   Followed by   phenobarbital  32.4 mg Oral Q8H   sodium chloride flush  10 mL Intrapleural Q8H   sodium chloride flush  10 mL Intrapleural Q8H   thiamine  100 mg Oral Daily   Or   thiamine  100 mg Intravenous Daily   Continuous Infusions:   prismasol BGK 4/2.5 400 mL/hr at 04/01/23 2104    prismasol BGK 4/2.5 400 mL/hr at 04/01/23 2105    ceFAZolin (ANCEF) IV Stopped (04/01/23 2141)   feeding supplement (VITAL 1.5 CAL) 40 mL/hr at 04/02/23 0800   heparin 10,000 units/ 20 mL infusion syringe 500  Units/hr (04/02/23 0456)   norepinephrine (LEVOPHED) Adult infusion 8 mcg/min (04/02/23 0800)   prismasol BGK 4/2.5 1,500 mL/hr at 04/02/23 0620   sodium PHOSPHATE IVPB (in mmol)     PRN Meds:.acetaminophen **OR** acetaminophen, heparin, LORazepam **OR** LORazepam, oxyCODONE, phenol   Louie Bun,  MD 04/02/2023, 8:32 AM

## 2023-04-02 NOTE — Progress Notes (Signed)
[facility] NEPHROLOGY PROGRESS NOTE  Assessment/ Plan: Pt is a 83 y.o. yo male  with PMH significant for hypertension, type II DM, CAD, A-fib, CKD (f/b Dr. Glenna Fellows) was admitted with shortness of breath, seen as a consultation for the evaluation and management of acute kidney injury.   # Acute kidney injury on CKD IV b/l cr around 2.5-2.9: AKI likely ischemic ATN due to septic shock.  Kidney ultrasound with no hydronephrosis.  Refractory to diuretics and remained hypotensive therefore moved to ICU on 1/15. Initiated CRRT on 1/15 after placement of temporary HD catheter by PCCM.  Tolerating CRRT well with the help up Levophed.  Continue CRRT and monitor for any recovery.  No urine output at this time Strict ins and out and close lab monitoring.  # Septic shock/staph bacteremia: Pressors per primary team.  Antibiotics per primary team  # Right pleural effusion status post right chest tube placement and drainage of fluid.  Plan per PCCM.   # Peripheral edema/fluid overload: Did not respond with IV diuretics and unable to use further diuresis because of persistent hypotension.  Started CRRT as discussed above.   # Acute hypoxic respiratory failure: Wean oxygen as able # Acute CHF # A-fib with RVR  Subjective: Patient more awake and alert today.  No complaints.  On 8 of norepinephrine.  -2.6 L on CRRT  Objective Vital signs in last 24 hours: Vitals:   04/02/23 0730 04/02/23 0745 04/02/23 0800 04/02/23 0815  BP: (!) 99/48 (!) 95/53 107/65 (!) 81/72  Pulse: (!) 108 (!) 110 (!) 115 (!) 112  Resp: (!) 22 18 (!) 37 (!) 22  Temp:   98.5 F (36.9 C)   TempSrc:   Oral   SpO2: 96% 92% 92% 97%  Weight:      Height:       Weight change: -1.8 kg  Intake/Output Summary (Last 24 hours) at 04/02/2023 0832 Last data filed at 04/02/2023 0800 Gross per 24 hour  Intake 1170.45 ml  Output 4010 ml  Net -2839.55 ml       Labs: RENAL PANEL Recent Labs  Lab 03/30/23 0251  03/31/23 0241 03/31/23 2108 04/01/23 0258 04/01/23 1615 04/02/23 0609  NA 134* 132* 133* 133* 132* 134*  K 4.9 4.8 5.2* 5.0 4.9 5.1  CL 97* 98 99 100 98 102  CO2 24 25 26 24 24 26   GLUCOSE 138* 176* 136* 127* 112* 203*  BUN 59* 45* 26* 23 20 24*  CREATININE 3.79* 3.32* 2.51* 2.38* 2.05* 1.98*  CALCIUM 8.3* 7.7* 8.3* 8.1* 8.2* 8.0*  MG 2.2 2.2  --  2.3 2.4 2.5*  PHOS  --  3.8 2.7 3.2 2.9  2.7 1.8*  ALBUMIN 2.7* 2.5* 2.5* 2.5* 2.4* 2.2*    Liver Function Tests: Recent Labs  Lab 03/29/23 0903 03/29/23 1832 03/30/23 0251 03/31/23 0241 04/01/23 0258 04/01/23 1615 04/02/23 0609  AST 28  --  24  --   --   --   --   ALT 24  --  23  --   --   --   --   ALKPHOS 84  --  68  --   --   --   --   BILITOT 1.9*  --  1.3*  --   --   --   --   PROT 6.8 6.2* 6.5  --   --   --   --   ALBUMIN 2.8*  --  2.7*   < > 2.5*  2.4* 2.2*   < > = values in this interval not displayed.   No results for input(s): "LIPASE", "AMYLASE" in the last 168 hours. No results for input(s): "AMMONIA" in the last 168 hours. CBC: Recent Labs    03/30/23 0251 03/31/23 0241 04/01/23 0353 04/01/23 1615 04/02/23 0614  HGB 10.4* 10.0* 9.2* 9.2* 8.7*  MCV 101.6* 101.9* 104.2* 103.9* 101.9*    Cardiac Enzymes: No results for input(s): "CKTOTAL", "CKMB", "CKMBINDEX", "TROPONINI" in the last 168 hours. CBG: Recent Labs  Lab 04/01/23 1601 04/01/23 1952 04/01/23 2336 04/02/23 0343 04/02/23 0613  GLUCAP 93 113* 133* 166* 167*    Iron Studies: No results for input(s): "IRON", "TIBC", "TRANSFERRIN", "FERRITIN" in the last 72 hours. Studies/Results: DG Abd Portable 1V Result Date: 04/01/2023 CLINICAL DATA:  [id number] Encounter for feeding tube placement [id number] EXAM: PORTABLE ABDOMEN - 1 VIEW COMPARISON:  None Available. FINDINGS: Limited radiograph of the lower chest and upper abdomen was obtained for the purposes of enteric tube localization. Enteric tube is seen coursing below the diaphragm with distal  tipterminating within the expected location of the gastric body or fundus. A right basilar chest tube is evident. IMPRESSION: Enteric tube terminates within the expected location of the gastric body or fundus. Electronically Signed   By: Duanne Guess D.O.   On: 04/01/2023 16:12   DG Chest Port 1 View Result Date: 04/01/2023 CLINICAL DATA:  Chest tube in place. EXAM: PORTABLE CHEST 1 VIEW COMPARISON:  March 31, 2023. FINDINGS: Stable cardiomediastinal silhouette. Right internal jugular catheter is unchanged. Right-sided chest tube is noted. Stable right pleural effusion is noted with associated atelectasis. Probable minimal left basilar atelectasis is noted. Bony thorax is unremarkable. IMPRESSION: Stable right-sided chest tube with stable right pleural effusion and associated right basilar atelectasis. Electronically Signed   By: Lupita Raider M.D.   On: 04/01/2023 11:57   MR LUMBAR SPINE WO CONTRAST Result Date: 03/31/2023 CLINICAL DATA:  Low back pain, infection suspected, no prior imaging EXAM: MRI LUMBAR SPINE WITHOUT CONTRAST TECHNIQUE: Multiplanar, multisequence MR imaging of the lumbar spine was performed. No intravenous contrast was administered. COMPARISON:  None Available. FINDINGS: Segmentation: Transitional lumbosacral anatomy with partially lumbarized S1 segment. Alignment: Slight (grade 1) stepwise retrolisthesis of L2 on L3 and L3 on L4. Otherwise, no substantial sagittal subluxation. Vertebrae: Mild degenerative/discogenic endplate signal changes at L2-L3. No specific evidence of discitis/osteomyelitis or suspicious bone lesion. Conus medullaris and cauda equina: Conus extends to the T12-L1 level. Conus and cauda equina appear normal. Paraspinal and other soft tissues: Unremarkable. Disc levels: T12-L1: No significant disc protrusion, foraminal stenosis, or canal stenosis. L1-L2: Mild disc bulge.  No significant stenosis. L2-L3: Mild disc bulge.  No significant stenosis. L3-L4: Mild  disc bulge. Bilateral facet arthropathy. No significant stenosis. L4-L5: Mild disc bulge and bilateral facet arthropathy. Resulting mild left foraminal stenosis. Patent canal and right foramen. L5-S1: Mild disc bulging. Bilateral facet arthropathy. No significant stenosis. S1-S2: Transitional anatomy. Right eccentric endplate spurring without significant stenosis. IMPRESSION: 1. No specific evidence of discitis/osteomyelitis on noncontrast imaging. 2. At L4-L5, mild foraminal stenosis. Otherwise, lower lumbar degenerative change without significant stenosis. 3. Transitional lumbosacral anatomy with partially lumbarized S1 vertebral body. Correlation with radiographs is recommended prior to any operative intervention. Electronically Signed   By: Feliberto Harts M.D.   On: 03/31/2023 20:00    Medications: Infusions:   prismasol BGK 4/2.5 400 mL/hr at 04/01/23 2104    prismasol BGK 4/2.5 400 mL/hr at 04/01/23 2105  ceFAZolin (ANCEF) IV Stopped (04/01/23 2141)   feeding supplement (VITAL 1.5 CAL) 40 mL/hr at 04/02/23 0800   heparin 10,000 units/ 20 mL infusion syringe 500 Units/hr (04/02/23 0456)   norepinephrine (LEVOPHED) Adult infusion 8 mcg/min (04/02/23 0800)   prismasol BGK 4/2.5 1,500 mL/hr at 04/02/23 0620   sodium PHOSPHATE IVPB (in mmol)      Scheduled Medications:  amiodarone  400 mg Oral BID   aspirin  81 mg Oral Daily   atorvastatin  40 mg Oral Daily   Chlorhexidine Gluconate Cloth  6 each Topical Daily   feeding supplement  237 mL Oral TID BM   feeding supplement (PROSource TF20)  60 mL Per Tube TID   folic acid  1 mg Oral Daily   heparin injection (subcutaneous)  5,000 Units Subcutaneous Q8H   insulin aspart  0-5 Units Subcutaneous QHS   insulin aspart  0-9 Units Subcutaneous TID WC   lidocaine  1 patch Transdermal Q24H   multivitamin  1 tablet Oral BID   nicotine  21 mg Transdermal Daily   phenobarbital  64.8 mg Oral Q8H   Followed by   phenobarbital  32.4 mg Oral Q8H    sodium chloride flush  10 mL Intrapleural Q8H   sodium chloride flush  10 mL Intrapleural Q8H   thiamine  100 mg Oral Daily   Or   thiamine  100 mg Intravenous Daily    have reviewed scheduled and prn medications.  Physical Exam: General:NAD, comfortable, sitting up in bed Heart:tachycardic, no murmur Lungs: Right chest tube with pinkish drainage, no increased work of breathing Abdomen:soft, Non-tender, non-distended Extremities:b/l leg edema+ Dialysis Access: Right IJ temporary HD catheter placed by PCCM on 1/15.  Bernestine Amass Kyara Boxer 04/02/2023,8:32 AM  LOS: 4 days

## 2023-04-02 NOTE — Progress Notes (Signed)
NAME:  Zachary Parrish, MRN:  [id number], DOB:  March 29, 1940, LOS: 4 ADMISSION DATE:  03/29/2023, CONSULTATION DATE:  1/14 REFERRING MD:  Miquel Dunn, CHIEF COMPLAINT:  EFFUSION    History of Present Illness:  83 year old male who presents to the emergency room with chief complaint of approximately 2 and half months of progressive exertional dyspnea.  Initially noted shortness of breath back in early November primarily with activity, around Thanksgiving time reporting fairly significant increased work of breathing to the point that family noticed, of note about 1 month prior to that certain to notice some mild increase lower extremity edema.  By Christmas time his shortness of breath had gotten to the point of resting dyspnea, he was also transition to 4 pillow orthopnea and at times would actually go out and sleep in his car to be in the upright position.  He has had productive cough with clear mucus, no chest pain, no palpitations.  No sick exposures.  His legs have become more painful over the last week, and he presents to the emergency room primarily because he can only speak in 2-3 word phrases and was significantly weak and orthopneic.  In the ER Evaluation Was initiated this demonstrated the following: Serum creatinine 2.83 is not far from his baseline, glucose 155 white blood cell count 14 troponin I of 23, a chest x-ray was obtained this demonstrated a large right pleural effusion because of this a CT of chest was obtained to further evaluate.  CT of chest demonstrated a large right pleural effusion with pleural thickening and rind, some basilar consolidated atelectasis which appeared rounded in nature, and a very small left pleural effusion he also had a 2.5 cm cystic lesion on his right thyroid.  Because of the pleural effusion and dyspnea pulmonary was asked to evaluate.  Pertinent  Medical History  Atrial fibrillation not on anticoagulation but has had prior cardioversion, carotid stenosis, CKD stage IIIb,  hyperlipidemia, coronary artery disease, type 2 diabetes, hypertension   Significant Hospital Events: Including procedures, antibiotic start and stop dates in addition to other pertinent events   1/14 admitted with progressive subacute dyspnea and large chronic appearing pleural effusion with rind on the right.  Thoracostomy tube placed in the emergency room  Interim History / Subjective:  No events, more lucid this am, did have 1x dose per tube ativan overnight.  Pressor needs stable.  Pulling ~50/hr on CRRT  Wife at bedside  Objective   Blood pressure 104/60, pulse (!) 108, temperature 98.5 F (36.9 C), temperature source Oral, resp. rate (!) 22, height 6\' 1"  (1.854 m), weight 90.9 kg, SpO2 98%. CVP:  [9 mmHg-13 mmHg] 9 mmHg      Intake/Output Summary (Last 24 hours) at 04/02/2023 1238 Last data filed at 04/02/2023 1200 Gross per 24 hour  Intake 1746.86 ml  Output 4404 ml  Net -2657.14 ml   Filed Weights   03/31/23 0613 04/01/23 0515 04/02/23 0305  Weight: 96.3 kg 92.7 kg 90.9 kg    Examination: Sleeping after getting up to chair Ext warm HR irregular tachy Scattered bruising Moves to command when awoken CVP ~12>>8  Labs look okay  Resolved Hospital Problem list     Assessment & Plan:  Acute hypoxic respiratory failure due to right-sided empyema Severe sepsis with septic shock due to staph aureus empyema status post chest tube placement, not POA Acute right-sided systolic heart failure Chronic atrial fibrillation AKI on CKD stage IV due to cardiorenal syndrome versus septic ATN Thyroid lesion Anemia  and thrombocytopenia of chronic disease Urinary retention Hypervolemic hyponatremia/Hypocalcemia DM type 2 Back pain- MRI neg Acute alcohol w/d delirium- +/- ICU delirium Tobacco dependence Dysphagia  - Pigtail to -20, redose of lytics, CT once there stops being a response - Consider additional lytics - Phenobarb taper with PRN ativan - TF per cortrak -  Levophed for MAP 65, start midodrine - check coox - CRRT pull as tolerated by BP - AC once done with thrombolytics in pleural space: probably tomorrow - Cefazolin with duration TBD - nicotine patch - agree with up to chair, mobility as tolerated  Best Practice (right click and "Reselect all SmartList Selections" daily)  Diet/type: Regular consistency + TF DVT prophylaxis: heparin dvt ppx, full dose once done lytics GI prophylaxis: N/A Lines: HD catheter, and yes and it is still needed Foley:  Yes, and it is still needed Code Status:  full code Last date of multidisciplinary goals of care discussion: 1/18: wife updated at bedside  33 min cc time Myrla Halsted MD PCCM [facility] See Amion for pager If no response to pager, please call [phone] until 7pm After 7pm, Please call E-link [phone]

## 2023-04-03 ENCOUNTER — Inpatient Hospital Stay (HOSPITAL_COMMUNITY): Payer: Medicare Other

## 2023-04-03 DIAGNOSIS — J869 Pyothorax without fistula: Secondary | ICD-10-CM | POA: Diagnosis not present

## 2023-04-03 DIAGNOSIS — I4819 Other persistent atrial fibrillation: Secondary | ICD-10-CM | POA: Diagnosis not present

## 2023-04-03 LAB — RENAL FUNCTION PANEL
Albumin: 2.1 g/dL — ABNORMAL LOW (ref 3.5–5.0)
Anion gap: 10 (ref 5–15)
BUN: 25 mg/dL — ABNORMAL HIGH (ref 8–23)
CO2: 25 mmol/L (ref 22–32)
Calcium: 7.7 mg/dL — ABNORMAL LOW (ref 8.9–10.3)
Chloride: 101 mmol/L (ref 98–111)
Creatinine, Ser: 1.54 mg/dL — ABNORMAL HIGH (ref 0.61–1.24)
GFR, Estimated: 45 mL/min — ABNORMAL LOW (ref >60)
Glucose, Bld: 179 mg/dL — ABNORMAL HIGH (ref 70–99)
Phosphorus: 2.8 mg/dL (ref 2.5–4.6)
Potassium: 4.5 mmol/L (ref 3.5–5.1)
Sodium: 136 mmol/L (ref 135–145)

## 2023-04-03 LAB — BASIC METABOLIC PANEL
Anion gap: 8 (ref 5–15)
BUN: 22 mg/dL (ref 8–23)
CO2: 25 mmol/L (ref 22–32)
Calcium: 7.5 mg/dL — ABNORMAL LOW (ref 8.9–10.3)
Chloride: 102 mmol/L (ref 98–111)
Creatinine, Ser: 1.44 mg/dL — ABNORMAL HIGH (ref 0.61–1.24)
GFR, Estimated: 49 mL/min — ABNORMAL LOW (ref >60)
Glucose, Bld: 227 mg/dL — ABNORMAL HIGH (ref 70–99)
Potassium: 4.5 mmol/L (ref 3.5–5.1)
Sodium: 135 mmol/L (ref 135–145)

## 2023-04-03 LAB — GLUCOSE, CAPILLARY
Glucose-Capillary: 167 mg/dL — ABNORMAL HIGH (ref 70–99)
Glucose-Capillary: 197 mg/dL — ABNORMAL HIGH (ref 70–99)
Glucose-Capillary: 140 mg/dL — ABNORMAL HIGH (ref 70–99)
Glucose-Capillary: 146 mg/dL — ABNORMAL HIGH (ref 70–99)

## 2023-04-03 LAB — CBC
HCT: 25.6 % — ABNORMAL LOW (ref 39.0–52.0)
HCT: 28.2 % — ABNORMAL LOW (ref 39.0–52.0)
Hemoglobin: 8.2 g/dL — ABNORMAL LOW (ref 13.0–17.0)
Hemoglobin: 8.8 g/dL — ABNORMAL LOW (ref 13.0–17.0)
MCH: 32.4 pg (ref 26.0–34.0)
MCH: 31.9 pg (ref 26.0–34.0)
MCHC: 32 g/dL (ref 30.0–36.0)
MCHC: 31.2 g/dL (ref 30.0–36.0)
MCV: 101.2 fL — ABNORMAL HIGH (ref 80.0–100.0)
MCV: 102.2 fL — ABNORMAL HIGH (ref 80.0–100.0)
Platelets: 147 10*3/uL — ABNORMAL LOW (ref 150–400)
Platelets: 198 10*3/uL (ref 150–400)
RBC: 2.53 MIL/uL — ABNORMAL LOW (ref 4.22–5.81)
RBC: 2.76 MIL/uL — ABNORMAL LOW (ref 4.22–5.81)
RDW: 17.2 % — ABNORMAL HIGH (ref 11.5–15.5)
RDW: 17.2 % — ABNORMAL HIGH (ref 11.5–15.5)
WBC: 10.5 10*3/uL (ref 4.0–10.5)
WBC: 12.2 10*3/uL — ABNORMAL HIGH (ref 4.0–10.5)
nRBC: 0 % (ref 0.0–0.2)
nRBC: 0.4 % — ABNORMAL HIGH (ref 0.0–0.2)

## 2023-04-03 LAB — TYPE AND SCREEN
ABO/RH(D): O POS
Antibody Screen: NEGATIVE

## 2023-04-03 LAB — MAGNESIUM: Magnesium: 2.3 mg/dL (ref 1.7–2.4)

## 2023-04-03 LAB — HEPARIN LEVEL (UNFRACTIONATED): Heparin Unfractionated: 0.79 [IU]/mL — ABNORMAL HIGH (ref 0.30–0.70)

## 2023-04-03 LAB — APTT: aPTT: 89 s — ABNORMAL HIGH (ref 24–36)

## 2023-04-03 LAB — PHOSPHORUS: Phosphorus: 1.6 mg/dL — ABNORMAL LOW (ref 2.5–4.6)

## 2023-04-03 MED ORDER — SODIUM PHOSPHATES 45 MMOLE/15ML IV SOLN
30.0000 mmol | Freq: Once | INTRAVENOUS | Status: AC
  Administered 2023-04-03: 30 mmol via INTRAVENOUS
  Filled 2023-04-03: qty 10

## 2023-04-03 MED ORDER — GUAIFENESIN-DM 100-10 MG/5ML PO SYRP
5.0000 mL | ORAL_SOLUTION | ORAL | Status: DC | PRN
  Administered 2023-04-09: 5 mL via ORAL
  Filled 2023-04-03 (×2): qty 5

## 2023-04-03 MED ORDER — HEPARIN (PORCINE) 25000 UT/250ML-% IV SOLN
1500.0000 [IU]/h | INTRAVENOUS | Status: DC
  Administered 2023-04-03: 1300 [IU]/h via INTRAVENOUS
  Administered 2023-04-04: 1200 [IU]/h via INTRAVENOUS
  Administered 2023-04-05 – 2023-04-06 (×2): 1000 [IU]/h via INTRAVENOUS
  Administered 2023-04-07 – 2023-04-09 (×3): 1100 [IU]/h via INTRAVENOUS
  Administered 2023-04-11 (×2): 1500 [IU]/h via INTRAVENOUS
  Filled 2023-04-03 (×9): qty 250

## 2023-04-03 MED ORDER — HEPARIN (PORCINE) 25000 UT/250ML-% IV SOLN
1300.0000 [IU]/h | INTRAVENOUS | Status: DC
  Filled 2023-04-03: qty 250

## 2023-04-03 NOTE — Progress Notes (Addendum)
PHARMACY - ANTICOAGULATION CONSULT NOTE  Pharmacy Consult for IV heparin Indication: atrial fibrillation  Allergies  Allergen Reactions   Allopurinol     Possible DRESS syndrome   Colchicine     Possible DRESS syndrome    Patient Measurements: Height: 6\' 1"  (185.4 cm) Weight: 89.9 kg (198 lb 3.1 oz) IBW/kg (Calculated) : 79.9 Heparin Dosing Weight: 89 kg  Vital Signs: Temp: 98.5 F (36.9 C) (01/19 0800) Temp Source: Oral (01/19 0800) BP: 105/66 (01/19 1100) Pulse Rate: 99 (01/19 1100)  Labs: Recent Labs    04/01/23 0258 04/01/23 0353 04/02/23 0609 04/02/23 0614 04/02/23 1613 04/03/23 0930  HGB  --    < >  --  8.7* 8.8* 8.2*  HCT  --    < >  --  27.5* 27.4* 25.6*  PLT  --    < >  --  149* 164 147*  APTT 27  --   --  54*  --  89*  CREATININE 2.38*   < > 1.98*  --  1.84* 1.44*   < > = values in this interval not displayed.    Estimated Creatinine Clearance: 45.5 mL/min (A) (by C-G formula based on SCr of 1.44 mg/dL (H)).   Medical History: Past Medical History:  Diagnosis Date   Acute renal failure (ARF) (HCC) 05/31/2019   B12 deficiency 08/02/2019   Bilateral carotid artery stenosis 05/31/2021   CAD (coronary artery disease) 2014   60-70% calcified LM lesion, high grade OM lesion, treated medically.   Chronic kidney disease, stage IV (severe) (HCC) 07/19/2019   Coronary artery disease involving native coronary artery of native heart without angina pectoris 07/19/2019   DM (diabetes mellitus) (HCC)    no longer on medications   Dyslipidemia 11/22/2019   Essential hypertension 07/19/2019   Gout    Gouty arthritis 09/06/2019   HTN (hypertension)    MSSA (methicillin susceptible Staphylococcus aureus) septicemia (HCC) 06/09/2012   Nonrheumatic aortic valve insufficiency 07/19/2019   OSA (obstructive sleep apnea) 04/19/2013   Dr Jerre Simon     PAF (paroxysmal atrial fibrillation) (HCC) 06/03/2019   during admission for DRESS   Shock (HCC) 05/31/2019     Medications:  Infusions:    prismasol BGK 4/2.5 400 mL/hr at 04/02/23 2302    prismasol BGK 4/2.5 400 mL/hr at 04/02/23 2302    ceFAZolin (ANCEF) IV Stopped (04/03/23 1011)   feeding supplement (VITAL 1.5 CAL) 50 mL/hr at 04/03/23 1100   heparin 10,000 units/ 20 mL infusion syringe 500 Units/hr (04/02/23 2308)   heparin     norepinephrine (LEVOPHED) Adult infusion 10 mcg/min (04/03/23 1100)   prismasol BGK 4/2.5 1,200 mL/hr at 04/03/23 1053   sodium PHOSPHATE IVPB (in mmol)      Assessment: 83 yo male with new afib, previously on heparin this admission, then it was held for intrapleural thrombolytics.  Now pharmacy asked to resume.  No overt bleeding or complications noted, CBC fairly stable.  Also on heparin in CRRT circuit, RN to try to wean with initiation of systemic heparin.  Goal of Therapy:  Heparin level 0.3-0.7 units/ml Monitor platelets by anticoagulation protocol: Yes   Plan:  Resume IV heparin at 1300 units/hr.   Check heparin level 8 hrs after gtt starts. Daily heparin level and CBC.  Reece Leader, Colon Flattery, [facility] Clinical Pharmacist  04/03/2023 11:24 AM   [facility] pharmacy phone numbers are listed on [email or web address]

## 2023-04-03 NOTE — Progress Notes (Signed)
Rounding Note    Patient Name: Zachary Parrish Date of Encounter: 04/03/2023  [facility] Cardiologist: Rollene Rotunda, MD   Subjective   No CP or dyspnea  Inpatient Medications    Scheduled Meds:  amiodarone  400 mg Oral BID   aspirin  81 mg Oral Daily   atorvastatin  40 mg Oral Daily   Chlorhexidine Gluconate Cloth  6 each Topical Daily   feeding supplement  237 mL Oral TID BM   feeding supplement (PROSource TF20)  60 mL Per Tube TID   folic acid  1 mg Oral Daily   insulin aspart  0-5 Units Subcutaneous QHS   insulin aspart  0-9 Units Subcutaneous TID WC   lidocaine  1 patch Transdermal Q24H   midodrine  10 mg Oral TID WC   multivitamin  1 tablet Oral BID   nicotine  21 mg Transdermal Daily   sodium chloride flush  10 mL Intrapleural Q8H   sodium chloride flush  10 mL Intrapleural Q8H   thiamine  100 mg Oral Daily   Or   thiamine  100 mg Intravenous Daily   Continuous Infusions:   prismasol BGK 4/2.5 400 mL/hr at 04/02/23 2302    prismasol BGK 4/2.5 400 mL/hr at 04/02/23 2302    ceFAZolin (ANCEF) IV Stopped (04/02/23 2210)   feeding supplement (VITAL 1.5 CAL) 50 mL/hr at 04/03/23 0800   heparin 10,000 units/ 20 mL infusion syringe 500 Units/hr (04/02/23 2308)   norepinephrine (LEVOPHED) Adult infusion 9 mcg/min (04/03/23 0800)   prismasol BGK 4/2.5 1,500 mL/hr at 04/03/23 0637   PRN Meds: acetaminophen **OR** acetaminophen, heparin, LORazepam **OR** [DISCONTINUED] LORazepam, oxyCODONE, phenol   Vital Signs    Vitals:   04/03/23 0625 04/03/23 0630 04/03/23 0645 04/03/23 0700  BP: (!) 90/55 (!) 93/54 107/60 (!) 106/51  Pulse: 95 96 (!) 103 (!) 113  Resp: (!) 21 19 (!) 25 (!) 29  Temp:      TempSrc:      SpO2: 99% 98% 96% (!) 84%  Weight:      Height:        Intake/Output Summary (Last 24 hours) at 04/03/2023 0814 Last data filed at 04/03/2023 0800 Gross per 24 hour  Intake 2538.6 ml  Output 3786 ml  Net -1247.4 ml      04/02/2023    3:05 AM  04/01/2023    5:15 AM 03/31/2023    6:13 AM  Last 3 Weights  Weight (lbs) 200 lb 6.4 oz 204 lb 5.9 oz 212 lb 4.9 oz  Weight (kg) 90.9 kg 92.7 kg 96.3 kg      Telemetry    Atrial fibrillation rate controlled - Personally Reviewed  Physical Exam   GEN: NAD Neck: supple Cardiac: Irregular Respiratory: CTA GI: Soft, NT/ND MS: No edema Neuro:  Nonfocal  Psych: Normal affect   Labs    High Sensitivity Troponin:   Recent Labs  Lab 03/29/23 0903 03/29/23 1130  TROPONINIHS 23* 23*     Chemistry Recent Labs  Lab 03/29/23 0903 03/29/23 0928 03/29/23 1832 03/30/23 0251 03/31/23 0241 04/01/23 1615 04/02/23 0609 04/02/23 1611 04/02/23 1613  NA 136   < >  --  134*   < > 132* 134*  --  135  K 4.8   < >  --  4.9   < > 4.9 5.1  --  4.4  CL 97*   < >  --  97*   < > 98 102  --  101  CO2 26  --   --  24   < > 24 26  --  27  GLUCOSE 155*   < >  --  138*   < > 112* 203*  --  196*  BUN 48*   < >  --  59*   < > 20 24*  --  24*  CREATININE 2.83*   < >  --  3.79*   < > 2.05* 1.98*  --  1.84*  CALCIUM 8.8*  --   --  8.3*   < > 8.2* 8.0*  --  7.8*  MG  --    < >  --  2.2   < > 2.4 2.5* 2.5*  --   PROT 6.8  --  6.2* 6.5  --   --   --   --   --   ALBUMIN 2.8*  --   --  2.7*   < > 2.4* 2.2*  --  2.2*  AST 28  --   --  24  --   --   --   --   --   ALT 24  --   --  23  --   --   --   --   --   ALKPHOS 84  --   --  68  --   --   --   --   --   BILITOT 1.9*  --   --  1.3*  --   --   --   --   --   GFRNONAA 22*  --   --  15*   < > 32* 33*  --  36*  ANIONGAP 13  --   --  13   < > 10 6  --  7   < > = values in this interval not displayed.     Hematology Recent Labs  Lab 04/01/23 1615 04/02/23 0614 04/02/23 1613  WBC 8.9 8.6 10.2  RBC 2.82* 2.70* 2.70*  HGB 9.2* 8.7* 8.8*  HCT 29.3* 27.5* 27.4*  MCV 103.9* 101.9* 101.5*  MCH 32.6 32.2 32.6  MCHC 31.4 31.6 32.1  RDW 17.1* 17.0* 17.1*  PLT 136* 149* 164   Thyroid  Recent Labs  Lab 03/29/23 1832  TSH 3.279    BNP Recent Labs   Lab 03/30/23 0251  BNP 346.8*      Radiology    DG Abd Portable 1V Result Date: 04/01/2023 CLINICAL DATA:  [id number] Encounter for feeding tube placement [id number] EXAM: PORTABLE ABDOMEN - 1 VIEW COMPARISON:  None Available. FINDINGS: Limited radiograph of the lower chest and upper abdomen was obtained for the purposes of enteric tube localization. Enteric tube is seen coursing below the diaphragm with distal tipterminating within the expected location of the gastric body or fundus. A right basilar chest tube is evident. IMPRESSION: Enteric tube terminates within the expected location of the gastric body or fundus. Electronically Signed   By: Duanne Guess D.O.   On: 04/01/2023 16:12    Patient Profile     83 y.o. male with past medical history of paroxysmal atrial fibrillation, DRESS syndrome, chronic kidney disease, diabetes mellitus, coronary artery disease, heart failure reduced ejection fraction with recovered EF (felt to be septic cardiomyopathy) alcohol abuse admitted with empyema, acute on chronic kidney injury and atrial fibrillation with rapid ventricular response.  Echocardiogram this admission shows ejection fraction 50 to 55%, moderate left ventricular hypertrophy, moderate RV dysfunction, mild right ventricular enlargement,  moderate left atrial enlargement, mild right atrial enlargement.  Assessment & Plan    1 persistent atrial fibrillation-patient's heart rate is controlled.  Continue amiodarone.  Would resume IV heparin when okay with critical care (he had bloody output from his chest tube).    2 empyema with MSSA-Chest tube in place.  3 shock-felt secondary to sepsis with MSSA empyema.  Continue norepinephrine and wean as tolerated.  Also on midodrine.  4 acute on chronic diastolic congestive heart failure-he appears to be euvolemic today.  Volume is being managed with CVVH.  5 acute on chronic stage IV kidney disease-ATN is suspected to be due to recent shock.  Continue  CVVH.  Nephrology following.  6 history of coronary artery disease with 60 to 70% distal left main by catheterization 2014.  He is not having chest pain.  Continue aspirin and statin.  For questions or updates, please contact [facility] Please consult [email or web address] for contact info under        Signed, Olga Millers, MD  04/03/2023, 8:14 AM

## 2023-04-03 NOTE — Progress Notes (Signed)
NAME:  Zachary Parrish, MRN:  [id number], DOB:  August 27, 1940, LOS: 5 ADMISSION DATE:  03/29/2023, CONSULTATION DATE:  1/14 REFERRING MD:  Miquel Dunn, CHIEF COMPLAINT:  EFFUSION    History of Present Illness:  83 year old male who presents to the emergency room with chief complaint of approximately 2 and half months of progressive exertional dyspnea.  Initially noted shortness of breath back in early November primarily with activity, around Thanksgiving time reporting fairly significant increased work of breathing to the point that family noticed, of note about 1 month prior to that certain to notice some mild increase lower extremity edema.  By Christmas time his shortness of breath had gotten to the point of resting dyspnea, he was also transition to 4 pillow orthopnea and at times would actually go out and sleep in his car to be in the upright position.  He has had productive cough with clear mucus, no chest pain, no palpitations.  No sick exposures.  His legs have become more painful over the last week, and he presents to the emergency room primarily because he can only speak in 2-3 word phrases and was significantly weak and orthopneic.  In the ER Evaluation Was initiated this demonstrated the following: Serum creatinine 2.83 is not far from his baseline, glucose 155 white blood cell count 14 troponin I of 23, a chest x-ray was obtained this demonstrated a large right pleural effusion because of this a CT of chest was obtained to further evaluate.  CT of chest demonstrated a large right pleural effusion with pleural thickening and rind, some basilar consolidated atelectasis which appeared rounded in nature, and a very small left pleural effusion he also had a 2.5 cm cystic lesion on his right thyroid.  Because of the pleural effusion and dyspnea pulmonary was asked to evaluate.  Pertinent  Medical History  Atrial fibrillation not on anticoagulation but has had prior cardioversion, carotid stenosis, CKD stage IIIb,  hyperlipidemia, coronary artery disease, type 2 diabetes, hypertension   Significant Hospital Events: Including procedures, antibiotic start and stop dates in addition to other pertinent events   1/14 admitted with progressive subacute dyspnea and large chronic appearing pleural effusion with rind on the right.  Thoracostomy tube placed in the emergency room  Interim History / Subjective:  No events. CVP improved. Still a bit sleepy Levo at 9 Wife at bedside  Objective   Blood pressure (!) 106/51, pulse (!) 113, temperature 98.5 F (36.9 C), temperature source Oral, resp. rate (!) 29, height 6\' 1"  (1.854 m), weight 90.9 kg, SpO2 (!) 84%. CVP:  [6 mmHg-13 mmHg] 6 mmHg      Intake/Output Summary (Last 24 hours) at 04/03/2023 0759 Last data filed at 04/03/2023 0700 Gross per 24 hour  Intake 2522.53 ml  Output 3873 ml  Net -1350.47 ml   Filed Weights   03/31/23 7829 04/01/23 0515 04/02/23 0305  Weight: 96.3 kg 92.7 kg 90.9 kg    Examination: No distress Globally weak Follows commands Scattered bruising Serosanguinous output R chest Lungs diminished on R Heart irregular, Afib on monitor  CBC pending BMP ok CXR showing lung entrapment but improved evacuation of pleural space  Resolved Hospital Problem list     Assessment & Plan:  Acute hypoxic respiratory failure due to right-sided empyema Severe sepsis with septic shock due to MSSA empyema status post chest tube placement, not POA Acute right-sided systolic heart failure Chronic atrial fibrillation AKI on CKD stage IV due to cardiorenal syndrome versus septic ATN Thyroid lesion Anemia  and thrombocytopenia of chronic disease Urinary retention Hypervolemic hyponatremia/Hypocalcemia DM type 2 Back pain- MRI neg Acute alcohol w/d delirium- +/- ICU delirium Tobacco dependence Dysphagia  - Pigtail to -20 for now, consider CT chest once off CRRT to see if we need to keep pushing thrombolytics although at this point  if lung is entrapped I am not sure what we are gaining by doing this - Good amount of phenobarb in system and has remained sleepy, will DC this, can use some PRN ativan PO if get into trouble, mental status overall improved - TF per cortrak until taking in more PO - Levophed for MAP 65, continue midodrine - Hopefully CRRT off by tomorrow - Start heparin - Cefazolin with duration TBD: likely at least 4 weeks after we think pleural space is addressed enough - nicotine patch - will need aggressive rehab  Best Practice (right click and "Reselect all SmartList Selections" daily)  Diet/type: Regular consistency + TF DVT prophylaxis: heparin  gtt GI prophylaxis: N/A Lines: HD catheter, and yes and it is still needed Foley:  Yes, and it is still needed Code Status:  full code Last date of multidisciplinary goals of care discussion: 1/19: wife updated at bedside  31 min cc time Myrla Halsted MD PCCM [facility] See Amion for pager If no response to pager, please call [phone] until 7pm After 7pm, Please call E-link [phone]

## 2023-04-03 NOTE — Procedures (Addendum)
I saw and evaluated the patient on CRRT.  I reviewed the last 24 hours events.  Adjustments to CRRT prescription are made as needed.  Volume status improved. Run closer to even today. Lower DFR given good clearance. Replace phos   Filed Weights   03/31/23 0613 04/01/23 0515 04/02/23 0305  Weight: 96.3 kg 92.7 kg 90.9 kg    Recent Labs  Lab 04/02/23 1613  NA 135  K 4.4  CL 101  CO2 27  GLUCOSE 196*  BUN 24*  CREATININE 1.84*  CALCIUM 7.8*  PHOS 3.0    Recent Labs  Lab 03/30/23 0251 03/31/23 0241 04/01/23 1615 04/02/23 0614 04/02/23 1613  WBC 11.2*   < > 8.9 8.6 10.2  NEUTROABS 9.7*  --   --   --   --   HGB 10.4*   < > 9.2* 8.7* 8.8*  HCT 32.6*   < > 29.3* 27.5* 27.4*  MCV 101.6*   < > 103.9* 101.9* 101.5*  PLT 135*   < > 136* 149* 164   < > = values in this interval not displayed.    Scheduled Meds:  amiodarone  400 mg Oral BID   aspirin  81 mg Oral Daily   atorvastatin  40 mg Oral Daily   Chlorhexidine Gluconate Cloth  6 each Topical Daily   feeding supplement  237 mL Oral TID BM   feeding supplement (PROSource TF20)  60 mL Per Tube TID   folic acid  1 mg Oral Daily   heparin injection (subcutaneous)  5,000 Units Subcutaneous Q8H   insulin aspart  0-5 Units Subcutaneous QHS   insulin aspart  0-9 Units Subcutaneous TID WC   lidocaine  1 patch Transdermal Q24H   midodrine  10 mg Oral TID WC   multivitamin  1 tablet Oral BID   nicotine  21 mg Transdermal Daily   phenobarbital  32.4 mg Oral Q8H   sodium chloride flush  10 mL Intrapleural Q8H   sodium chloride flush  10 mL Intrapleural Q8H   thiamine  100 mg Oral Daily   Or   thiamine  100 mg Intravenous Daily   Continuous Infusions:   prismasol BGK 4/2.5 400 mL/hr at 04/02/23 2302    prismasol BGK 4/2.5 400 mL/hr at 04/02/23 2302    ceFAZolin (ANCEF) IV Stopped (04/02/23 2210)   feeding supplement (VITAL 1.5 CAL) 50 mL/hr at 04/03/23 0700   heparin 10,000 units/ 20 mL infusion syringe 500 Units/hr  (04/02/23 2308)   norepinephrine (LEVOPHED) Adult infusion 9 mcg/min (04/03/23 0700)   prismasol BGK 4/2.5 1,500 mL/hr at 04/03/23 0637   PRN Meds:.acetaminophen **OR** acetaminophen, heparin, LORazepam **OR** LORazepam, oxyCODONE, phenol   Louie Bun,  MD 04/03/2023, 7:45 AM

## 2023-04-03 NOTE — Progress Notes (Signed)
PHARMACY - ANTICOAGULATION CONSULT NOTE  Pharmacy Consult for IV heparin Indication: atrial fibrillation  Allergies  Allergen Reactions   Allopurinol     Possible DRESS syndrome   Colchicine     Possible DRESS syndrome    Patient Measurements: Height: 6\' 1"  (185.4 cm) Weight: 89.9 kg (198 lb 3.1 oz) IBW/kg (Calculated) : 79.9 Heparin Dosing Weight: 89 kg  Vital Signs: Temp: 97.9 F (36.6 C) (01/19 1930) Temp Source: Oral (01/19 1930) BP: 102/46 (01/19 2216) Pulse Rate: 98 (01/19 2215)  Labs: Recent Labs    04/01/23 0258 04/01/23 0353 04/02/23 0614 04/02/23 1613 04/03/23 0930 04/03/23 1607 04/03/23 2125  HGB  --    < > 8.7* 8.8* 8.2* 8.8*  --   HCT  --    < > 27.5* 27.4* 25.6* 28.2*  --   PLT  --    < > 149* 164 147* 198  --   APTT 27  --  54*  --  89*  --   --   HEPARINUNFRC  --   --   --   --   --   --  0.79*  CREATININE 2.38*   < >  --  1.84* 1.44* 1.54*  --    < > = values in this interval not displayed.    Estimated Creatinine Clearance: 42.5 mL/min (A) (by C-G formula based on SCr of 1.54 mg/dL (H)).   Medical History: Past Medical History:  Diagnosis Date   Acute renal failure (ARF) (HCC) 05/31/2019   B12 deficiency 08/02/2019   Bilateral carotid artery stenosis 05/31/2021   CAD (coronary artery disease) 2014   60-70% calcified LM lesion, high grade OM lesion, treated medically.   Chronic kidney disease, stage IV (severe) (HCC) 07/19/2019   Coronary artery disease involving native coronary artery of native heart without angina pectoris 07/19/2019   DM (diabetes mellitus) (HCC)    no longer on medications   Dyslipidemia 11/22/2019   Essential hypertension 07/19/2019   Gout    Gouty arthritis 09/06/2019   HTN (hypertension)    MSSA (methicillin susceptible Staphylococcus aureus) septicemia (HCC) 06/09/2012   Nonrheumatic aortic valve insufficiency 07/19/2019   OSA (obstructive sleep apnea) 04/19/2013   Dr Jerre Simon     PAF (paroxysmal atrial  fibrillation) (HCC) 06/03/2019   during admission for DRESS   Shock (HCC) 05/31/2019    Medications:  Infusions:    prismasol BGK 4/2.5 400 mL/hr at 04/03/23 1242    prismasol BGK 4/2.5 400 mL/hr at 04/03/23 1242    ceFAZolin (ANCEF) IV 2 g (04/03/23 2125)   feeding supplement (VITAL 1.5 CAL) 50 mL/hr at 04/03/23 2100   heparin 1,300 Units/hr (04/03/23 2100)   norepinephrine (LEVOPHED) Adult infusion 10 mcg/min (04/03/23 2100)   prismasol BGK 4/2.5 1,200 mL/hr at 04/03/23 1858    Assessment: 83 yo male with new afib, previously on heparin this admission, then it was held for intrapleural thrombolytics.  Now pharmacy asked to resume.  No overt bleeding or complications noted, CBC fairly stable. CRRT heparin syringe turned off earlier 1/19 @ 1200 - order now d/c.   HL 0.79 - supratherapeutic off CRRT heparin   Goal of Therapy:  Heparin level 0.3-0.7 units/ml Monitor platelets by anticoagulation protocol: Yes   Plan:  Reduce heparin to 1200 units/hr.   Check heparin level in 8hrs (with AM labs) Daily heparin level and CBC.  Calton Dach, PharmD, BCCCP Clinical Pharmacist 04/03/2023 10:23 PM

## 2023-04-03 NOTE — Progress Notes (Signed)
[facility]OLOG[facility] PROGRESS NOTE  Assessment/ Plan: Pt is a 83 y.o. yo male  with PMH significant for hypertension, type II DM, CAD, A-fib, CKD (f/b Dr. Glenna Fellows) was admitted with shortness of breath, seen as a consultation for the evaluation and management of acute kidney injury.   # Acute kidney injury on CKD IV b/l cr around 2.5-2.9: AKI likely ischemic ATN due to septic shock.  Kidney ultrasound with no hydronephrosis.  Refractory to diuretics and remained hypotensive therefore moved to ICU on 1/15. Initiated CRRT on 1/15 after placement of temporary HD catheter by PCCM.  Tolerating CRRT well with the help up Levophed.  Continue CRRT and monitor for any recovery.  No urine output at this time Strict ins and out and close lab monitoring. Run closer to even.  # Septic shock/staph bacteremia: Pressors per primary team.  Antibiotics per primary team  # Right pleural effusion status post right chest tube placement and drainage of fluid.  Plan per PCCM.   # Peripheral edema/fluid overload: Did not respond with IV diuretics and unable to use further diuresis because of persistent hypotension.  Started CRRT as discussed above.   # Acute hypoxic respiratory failure: Wean oxygen as able # Acute CHF: optimizing volume status w/ crrt # A-fib with RVR  Subjective: Resting today with no complaints. Norepi 10. -1.2L.  Objective Vital signs in last 24 hours: Vitals:   04/03/23 0625 04/03/23 0630 04/03/23 0645 04/03/23 0700  BP: (!) 90/55 (!) 93/54 107/60 (!) 106/51  Pulse: 95 96 (!) 103 (!) 113  Resp: (!) 21 19 (!) 25 (!) 29  Temp:      TempSrc:      SpO2: 99% 98% 96% (!) 84%  Weight:      Height:       Weight change:   Intake/Output Summary (Last 24 hours) at 04/03/2023 0743 Last data filed at 04/03/2023 0700 Gross per 24 hour  Intake 2522.53 ml  Output 3873 ml  Net -1350.47 ml       Labs: RENAL PANEL Recent Labs  Lab 03/31/23 0241 03/31/23 2108  04/01/23 0258 04/01/23 1615 04/02/23 0609 04/02/23 1611 04/02/23 1613  NA 132* 133* 133* 132* 134*  --  135  K 4.8 5.2* 5.0 4.9 5.1  --  4.4  CL 98 99 100 98 102  --  101  CO2 25 26 24 24 26   --  27  GLUCOSE 176* 136* 127* 112* 203*  --  196*  BUN 45* 26* 23 20 24*  --  24*  CREATININE 3.32* 2.51* 2.38* 2.05* 1.98*  --  1.84*  CALCIUM 7.7* 8.3* 8.1* 8.2* 8.0*  --  7.8*  MG 2.2  --  2.3 2.4 2.5* 2.5*  --   PHOS 3.8 2.7 3.2 2.9  2.7 1.8* 3.0 3.0  ALBUMIN 2.5* 2.5* 2.5* 2.4* 2.2*  --  2.2*    Liver Function Tests: Recent Labs  Lab 03/29/23 0903 03/29/23 1832 03/30/23 0251 03/31/23 0241 04/01/23 1615 04/02/23 0609 04/02/23 1613  AST 28  --  24  --   --   --   --   ALT 24  --  23  --   --   --   --   ALKPHOS 84  --  68  --   --   --   --   BILITOT 1.9*  --  1.3*  --   --   --   --   PROT 6.8 6.2* 6.5  --   --   --   --  ALBUMIN 2.8*  --  2.7*   < > 2.4* 2.2* 2.2*   < > = values in this interval not displayed.   No results for input(s): "LIPASE", "AMYLASE" in the last 168 hours. No results for input(s): "AMMONIA" in the last 168 hours. CBC: Recent Labs    03/31/23 0241 04/01/23 0353 04/01/23 1615 04/02/23 0614 04/02/23 1613  HGB 10.0* 9.2* 9.2* 8.7* 8.8*  MCV 101.9* 104.2* 103.9* 101.9* 101.5*    Cardiac Enzymes: No results for input(s): "CKTOTAL", "CKMB", "CKMBINDEX", "TROPONINI" in the last 168 hours. CBG: Recent Labs  Lab 04/02/23 0613 04/02/23 1226 04/02/23 1617 04/02/23 1923 04/03/23 0632  GLUCAP 167* 189* 153* 152* 167*    Iron Studies: No results for input(s): "IRON", "TIBC", "TRANSFERRIN", "FERRITIN" in the last 72 hours. Studies/Results: DG Abd Portable 1V Result Date: 04/01/2023 CLINICAL DATA:  [id number] Encounter for feeding tube placement [id number] EXAM: PORTABLE ABDOMEN - 1 VIEW COMPARISON:  None Available. FINDINGS: Limited radiograph of the lower chest and upper abdomen was obtained for the purposes of enteric tube localization. Enteric tube is  seen coursing below the diaphragm with distal tipterminating within the expected location of the gastric body or fundus. A right basilar chest tube is evident. IMPRESSION: Enteric tube terminates within the expected location of the gastric body or fundus. Electronically Signed   By: Duanne Guess D.O.   On: 04/01/2023 16:12   DG Chest Port 1 View Result Date: 04/01/2023 CLINICAL DATA:  Chest tube in place. EXAM: PORTABLE CHEST 1 VIEW COMPARISON:  March 31, 2023. FINDINGS: Stable cardiomediastinal silhouette. Right internal jugular catheter is unchanged. Right-sided chest tube is noted. Stable right pleural effusion is noted with associated atelectasis. Probable minimal left basilar atelectasis is noted. Bony thorax is unremarkable. IMPRESSION: Stable right-sided chest tube with stable right pleural effusion and associated right basilar atelectasis. Electronically Signed   By: Lupita Raider M.D.   On: 04/01/2023 11:57    Medications: Infusions:   prismasol BGK 4/2.5 400 mL/hr at 04/02/23 2302    prismasol BGK 4/2.5 400 mL/hr at 04/02/23 2302    ceFAZolin (ANCEF) IV Stopped (04/02/23 2210)   feeding supplement (VITAL 1.5 CAL) 50 mL/hr at 04/03/23 0700   heparin 10,000 units/ 20 mL infusion syringe 500 Units/hr (04/02/23 2308)   norepinephrine (LEVOPHED) Adult infusion 9 mcg/min (04/03/23 0700)   prismasol BGK 4/2.5 1,500 mL/hr at 04/03/23 2595    Scheduled Medications:  amiodarone  400 mg Oral BID   aspirin  81 mg Oral Daily   atorvastatin  40 mg Oral Daily   Chlorhexidine Gluconate Cloth  6 each Topical Daily   feeding supplement  237 mL Oral TID BM   feeding supplement (PROSource TF20)  60 mL Per Tube TID   folic acid  1 mg Oral Daily   heparin injection (subcutaneous)  5,000 Units Subcutaneous Q8H   insulin aspart  0-5 Units Subcutaneous QHS   insulin aspart  0-9 Units Subcutaneous TID WC   lidocaine  1 patch Transdermal Q24H   midodrine  10 mg Oral TID WC   multivitamin  1  tablet Oral BID   nicotine  21 mg Transdermal Daily   phenobarbital  32.4 mg Oral Q8H   sodium chloride flush  10 mL Intrapleural Q8H   sodium chloride flush  10 mL Intrapleural Q8H   thiamine  100 mg Oral Daily   Or   thiamine  100 mg Intravenous Daily    have reviewed scheduled and prn medications.  Physical Exam: General:NAD, resting in bed Heart:normal rate, no rub Lungs: Right chest tube with pinkish drainage, no increased work of breathing Abdomen:soft, Non-tender, non-distended Extremities:no edema at the ankles bilaterally Dialysis Access: Right IJ temporary HD catheter placed by PCCM on 1/15.  Bernestine Amass Kerianna Rawlinson 04/03/2023,7:43 AM  LOS: 5 days

## 2023-04-04 ENCOUNTER — Inpatient Hospital Stay (HOSPITAL_COMMUNITY): Payer: Medicare Other

## 2023-04-04 DIAGNOSIS — J869 Pyothorax without fistula: Secondary | ICD-10-CM | POA: Diagnosis not present

## 2023-04-04 DIAGNOSIS — B9561 Methicillin susceptible Staphylococcus aureus infection as the cause of diseases classified elsewhere: Secondary | ICD-10-CM | POA: Diagnosis not present

## 2023-04-04 LAB — CULTURE, BLOOD (ROUTINE X 2)
Culture: NO GROWTH
Culture: NO GROWTH

## 2023-04-04 LAB — RENAL FUNCTION PANEL
Albumin: 1.9 g/dL — ABNORMAL LOW (ref 3.5–5.0)
Albumin: 2 g/dL — ABNORMAL LOW (ref 3.5–5.0)
Anion gap: 7 (ref 5–15)
Anion gap: 9 (ref 5–15)
BUN: 27 mg/dL — ABNORMAL HIGH (ref 8–23)
BUN: 27 mg/dL — ABNORMAL HIGH (ref 8–23)
CO2: 27 mmol/L (ref 22–32)
CO2: 26 mmol/L (ref 22–32)
Calcium: 7.6 mg/dL — ABNORMAL LOW (ref 8.9–10.3)
Calcium: 7.6 mg/dL — ABNORMAL LOW (ref 8.9–10.3)
Chloride: 100 mmol/L (ref 98–111)
Chloride: 99 mmol/L (ref 98–111)
Creatinine, Ser: 1.5 mg/dL — ABNORMAL HIGH (ref 0.61–1.24)
Creatinine, Ser: 1.53 mg/dL — ABNORMAL HIGH (ref 0.61–1.24)
GFR, Estimated: 46 mL/min — ABNORMAL LOW (ref >60)
GFR, Estimated: 45 mL/min — ABNORMAL LOW (ref >60)
Glucose, Bld: 224 mg/dL — ABNORMAL HIGH (ref 70–99)
Glucose, Bld: 235 mg/dL — ABNORMAL HIGH (ref 70–99)
Phosphorus: 1.4 mg/dL — ABNORMAL LOW (ref 2.5–4.6)
Phosphorus: 2.3 mg/dL — ABNORMAL LOW (ref 2.5–4.6)
Potassium: 4.5 mmol/L (ref 3.5–5.1)
Potassium: 4.4 mmol/L (ref 3.5–5.1)
Sodium: 134 mmol/L — ABNORMAL LOW (ref 135–145)
Sodium: 134 mmol/L — ABNORMAL LOW (ref 135–145)

## 2023-04-04 LAB — GLUCOSE, CAPILLARY
Glucose-Capillary: 207 mg/dL — ABNORMAL HIGH (ref 70–99)
Glucose-Capillary: 197 mg/dL — ABNORMAL HIGH (ref 70–99)
Glucose-Capillary: 211 mg/dL — ABNORMAL HIGH (ref 70–99)
Glucose-Capillary: 193 mg/dL — ABNORMAL HIGH (ref 70–99)

## 2023-04-04 LAB — CBC
HCT: 24 % — ABNORMAL LOW (ref 39.0–52.0)
Hemoglobin: 7.8 g/dL — ABNORMAL LOW (ref 13.0–17.0)
MCH: 32.2 pg (ref 26.0–34.0)
MCHC: 32.5 g/dL (ref 30.0–36.0)
MCV: 99.2 fL (ref 80.0–100.0)
Platelets: 174 10*3/uL (ref 150–400)
RBC: 2.42 MIL/uL — ABNORMAL LOW (ref 4.22–5.81)
RDW: 17.2 % — ABNORMAL HIGH (ref 11.5–15.5)
WBC: 11.8 10*3/uL — ABNORMAL HIGH (ref 4.0–10.5)
nRBC: 0.9 % — ABNORMAL HIGH (ref 0.0–0.2)

## 2023-04-04 LAB — ABO/RH: ABO/RH(D): O POS

## 2023-04-04 LAB — MAGNESIUM: Magnesium: 2.3 mg/dL (ref 1.7–2.4)

## 2023-04-04 LAB — HEPARIN LEVEL (UNFRACTIONATED)
Heparin Unfractionated: 0.73 [IU]/mL — ABNORMAL HIGH (ref 0.30–0.70)
Heparin Unfractionated: 0.56 [IU]/mL (ref 0.30–0.70)

## 2023-04-04 LAB — APTT: aPTT: >200 s (ref 24–36)

## 2023-04-04 MED ORDER — SODIUM PHOSPHATES 45 MMOLE/15ML IV SOLN
30.0000 mmol | Freq: Once | INTRAVENOUS | Status: AC
  Administered 2023-04-04: 30 mmol via INTRAVENOUS
  Filled 2023-04-04 (×2): qty 10

## 2023-04-04 MED ORDER — SODIUM PHOSPHATES 45 MMOLE/15ML IV SOLN
30.0000 mmol | Freq: Once | INTRAVENOUS | Status: DC
  Filled 2023-04-04: qty 10

## 2023-04-04 NOTE — Progress Notes (Signed)
NAME:  Zachary Parrish, MRN:  [id number], DOB:  1940/08/29, LOS: 6 ADMISSION DATE:  03/29/2023, CONSULTATION DATE:  1/14 REFERRING MD:  Miquel Dunn, CHIEF COMPLAINT:  EFFUSION    History of Present Illness:  83 year old male who presents to the emergency room with chief complaint of approximately 2 and half months of progressive exertional dyspnea.  Initially noted shortness of breath back in early November primarily with activity, around Thanksgiving time reporting fairly significant increased work of breathing to the point that family noticed, of note about 1 month prior to that certain to notice some mild increase lower extremity edema.  By Christmas time his shortness of breath had gotten to the point of resting dyspnea, he was also transition to 4 pillow orthopnea and at times would actually go out and sleep in his car to be in the upright position.  He has had productive cough with clear mucus, no chest pain, no palpitations.  No sick exposures.  His legs have become more painful over the last week, and he presents to the emergency room primarily because he can only speak in 2-3 word phrases and was significantly weak and orthopneic.  In the ER Evaluation Was initiated this demonstrated the following: Serum creatinine 2.83 is not far from his baseline, glucose 155 white blood cell count 14 troponin I of 23, a chest x-ray was obtained this demonstrated a large right pleural effusion because of this a CT of chest was obtained to further evaluate.  CT of chest demonstrated a large right pleural effusion with pleural thickening and rind, some basilar consolidated atelectasis which appeared rounded in nature, and a very small left pleural effusion he also had a 2.5 cm cystic lesion on his right thyroid.  Because of the pleural effusion and dyspnea pulmonary was asked to evaluate.  Pertinent  Medical History  Atrial fibrillation not on anticoagulation but has had prior cardioversion, carotid stenosis, CKD stage IIIb,  hyperlipidemia, coronary artery disease, type 2 diabetes, hypertension   Significant Hospital Events: Including procedures, antibiotic start and stop dates in addition to other pertinent events   1/14 admitted with progressive subacute dyspnea and large chronic appearing pleural effusion with rind on the right.  Thoracostomy tube placed in the emergency room  Interim History / Subjective:  No complaints this morning NE weaning today 10 > 7 now CRRT ongoing Chest tube 270 x 24 hours, but only 70 out since 1900. Afebrile  Objective   Blood pressure (!) 101/54, pulse 99, temperature 98.3 F (36.8 C), temperature source Oral, resp. rate (!) 23, height 6\' 1"  (1.854 m), weight 88.1 kg, SpO2 98%. CVP:  [4 mmHg-7 mmHg] 7 mmHg      Intake/Output Summary (Last 24 hours) at 04/04/2023 1009 Last data filed at 04/04/2023 0918 Gross per 24 hour  Intake 2657.85 ml  Output 2812 ml  Net -154.15 ml   Filed Weights   04/02/23 0305 04/03/23 0701 04/04/23 0500  Weight: 90.9 kg 89.9 kg 88.1 kg    Examination: General:  elderly male in NAD Neuro:  Alert, oriented, non-focal HEENT:  [place]/AT, No JVD noted, PERRL Cardiovascular:  RRR, no MRG Lungs:  Diminished R base otherwise clear.  Abdomen:  Soft, non-distended, non-tender.  Musculoskeletal:  No acute deformity or ROM limitation Skin:  Intact. MMM   Hgb down to 7.8 from 8.8 Cr 1.5 Phos 1.4 APPT > 200  Resolved Hospital Problem list     Assessment & Plan:    Acute hypoxic respiratory failure due to right-sided  empyema Empyema CT shows improvement of R empyema. Still some residual hydropneumo, trapped lung.  - on room air now - Pulmonary hygiene - Encourage IS - Continue chest tube to suction. May be nearing the end of its benefit if lung is entrapped.   Septic shock due to MSSA empyema status post chest tube placement, not POA - Continue Chest tune to suction for source control.  - Ancef: duration uncertain. Dr. Katrinka Blazing suggesting 4  weeks after empyema drainage felt optimal.  - NE weaning for MAP 65. Midodrine - CRRT keeping even  Acute right-sided systolic heart failure Chronic atrial fibrillation - Volume management with CRRT for now. Hopefully can avoid HD need.  - Heparin infusion > hgb down a bit this morning. No obvious bleeding. Will monitor.   AKI on CKD stage IV due to cardiorenal syndrome versus septic ATN Hypervolemic hyponatremia/Hypocalcemia - CRRT for now, stopping today. - Trend chemistry  Anemia and thrombocytopenia of chronic disease - Trend H&H - Transfuse per usual guidelines.   Urinary retention - Hopefully UOP will pick up with CRRT off - Bladder scan PRN.   DM type 2 - CBG monitoring and SSI  Back pain- MRI neg  Acute alcohol w/d delirium- +/- ICU delirium - Was pretty sleepy on phenobarb. Now off. Much more awake.  - PRN benzo  Tobacco dependence - nicotine patch  Dysphagia - swallowing  better - Continue TF until taking more PO.   Thyroid lesion - follow up when appropriate    Best Practice (right click and "Reselect all SmartList Selections" daily)  Diet/type: Regular consistency + TF DVT prophylaxis: heparin  gtt GI prophylaxis: N/A Lines: HD catheter, and yes and it is still needed Foley:  No Code Status:  full code Last date of multidisciplinary goals of care discussion: 1/19: wife updated at bedside  Critical care time 39 minutes  Joneen Roach, AGACNP-BC [facility]  See Amion for personal pager PCCM on call pager [phone] until 7pm. Please call Elink 7p-7a. [phone]  04/04/2023 10:29 AM

## 2023-04-04 NOTE — Progress Notes (Signed)
[facility] NEPHROLOGY PROGRESS NOTE  Assessment/ Plan: Pt is a 83 y.o. yo male  with PMH significant for hypertension, type II DM, CAD, A-fib, CKD (f/b Dr. Glenna Fellows) was admitted with shortness of breath, seen as a consultation for the evaluation and management of acute kidney injury.   # Acute kidney injury on CKD IV b/l cr around 2.5-2.9: AKI likely ischemic ATN due to septic shock.  Kidney ultrasound with no hydronephrosis.  Refractory to diuretics and remained hypotensive therefore moved to ICU on 1/15.  During this hospitalization -8.4 L.  Initiated CRRT on 1/15 after placement of temporary HD catheter by PCCM.   Tolerating CRRT 500/300/1500; 4K baths Levophed 9 CVP 7, running even for now  Continue CRRT and monitor for any recovery.  No urine output at this time Strict ins and out and close lab monitoring.   # Septic shock/staph bacteremia: Pressors per primary team.  Antibiotics per primary team  # Right pleural effusion status post right chest tube placement and drainage of fluid.  Plan per PCCM.   # Peripheral edema/fluid overload: Did not respond with IV diuretics and unable to use further diuresis because of persistent hypotension.  Started CRRT as discussed above.   # Acute hypoxic respiratory failure: Wean oxygen as able # Acute CHF: optimizing volume status w/ crrt # A-fib with RVR  Subjective: Resting today with no complaints. Norepi 9  Objective Vital signs in last 24 hours: Vitals:   04/04/23 0630 04/04/23 0645 04/04/23 0700 04/04/23 0800  BP: (!) 92/57 (!) 105/57 (!) 122/51 113/68  Pulse: 93 (!) 105 97 (!) 102  Resp: 13 (!) 8 (!) 9 10  Temp:    98.3 F (36.8 C)  TempSrc:    Oral  SpO2: 98% 97% 97% 98%  Weight:      Height:       Weight change:   Intake/Output Summary (Last 24 hours) at 04/04/2023 0810 Last data filed at 04/04/2023 0800 Gross per 24 hour  Intake 2792.01 ml  Output 2860 ml  Net -67.99 ml       Labs: RENAL  PANEL Recent Labs  Lab 04/01/23 0258 04/01/23 1615 04/02/23 0609 04/02/23 1611 04/02/23 1613 04/03/23 0930 04/03/23 1607  NA 133* 132* 134*  --  135 135 136  K 5.0 4.9 5.1  --  4.4 4.5 4.5  CL 100 98 102  --  101 102 101  CO2 24 24 26   --  27 25 25   GLUCOSE 127* 112* 203*  --  196* 227* 179*  BUN 23 20 24*  --  24* 22 25*  CREATININE 2.38* 2.05* 1.98*  --  1.84* 1.44* 1.54*  CALCIUM 8.1* 8.2* 8.0*  --  7.8* 7.5* 7.7*  MG 2.3 2.4 2.5* 2.5*  --  2.3  --   PHOS 3.2 2.9  2.7 1.8* 3.0 3.0 1.6* 2.8  ALBUMIN 2.5* 2.4* 2.2*  --  2.2*  --  2.1*    Liver Function Tests: Recent Labs  Lab 03/29/23 0903 03/29/23 1832 03/30/23 0251 03/31/23 0241 04/02/23 0609 04/02/23 1613 04/03/23 1607  AST 28  --  24  --   --   --   --   ALT 24  --  23  --   --   --   --   ALKPHOS 84  --  68  --   --   --   --   BILITOT 1.9*  --  1.3*  --   --   --   --  PROT 6.8 6.2* 6.5  --   --   --   --   ALBUMIN 2.8*  --  2.7*   < > 2.2* 2.2* 2.1*   < > = values in this interval not displayed.   No results for input(s): "LIPASE", "AMYLASE" in the last 168 hours. No results for input(s): "AMMONIA" in the last 168 hours. CBC: Recent Labs    04/02/23 0614 04/02/23 1613 04/03/23 0930 04/03/23 1607 04/04/23 0729  HGB 8.7* 8.8* 8.2* 8.8* 7.8*  MCV 101.9* 101.5* 101.2* 102.2* 99.2    Cardiac Enzymes: No results for input(s): "CKTOTAL", "CKMB", "CKMBINDEX", "TROPONINI" in the last 168 hours. CBG: Recent Labs  Lab 04/03/23 0632 04/03/23 1236 04/03/23 1609 04/03/23 2059 04/04/23 0642  GLUCAP 167* 197* 140* 146* 207*    Iron Studies: No results for input(s): "IRON", "TIBC", "TRANSFERRIN", "FERRITIN" in the last 72 hours. Studies/Results: CT CHEST WO CONTRAST Result Date: 04/03/2023 CLINICAL DATA:  Empyema evaluation. EXAM: CT CHEST WITHOUT CONTRAST TECHNIQUE: Multidetector CT imaging of the chest was performed following the standard protocol without IV contrast. RADIATION DOSE REDUCTION: This  exam was performed according to the departmental dose-optimization program which includes automated exposure control, adjustment of the mA and/or kV according to patient size and/or use of iterative reconstruction technique. COMPARISON:  CT 03/29/2023, chest x-ray 04/01/2023 and 04/03/2023 FINDINGS: Cardiovascular: Stable cardiomegaly. Right IJ central venous catheter has tip over the SVC. Calcified plaque over the left main, left anterior descending and right coronary arteries. Thoracic aorta is normal in caliber. Mild calcified plaque over the descending thoracic aorta. Remaining vascular structures are unchanged. Mediastinum/Nodes: No significant mediastinal or hilar adenopathy. Remaining mediastinal structures are unremarkable. Stable 2.3 cm right thyroid nodule. Lungs/Pleura: Interval placement of right basilar pigtail drainage catheter. Significant interval decrease in size of the right pleural fluid collection with persistent small stable right apical hydropneumothorax. Small right lateral component of the right-sided hydropneumothorax present. Mild atelectasis over the right middle and upper lobes. Persistent consolidation over the right lower lobe with somewhat masslike appearance measuring 3.7 x 6 cm which is unchanged. This may represent rounded atelectasis, although underlying mass is possible. Left lung demonstrates slight interval worsening of a small pleural effusion and is otherwise clear. Mild dependent debris along the right lateral wall of the trachea likely aspirate material. Upper Abdomen: Calcified plaque over the abdominal aorta. Nasogastric tube is present with tip over the distal stomach. Gallbladder is contracted. No acute findings. Musculoskeletal: Unchanged. IMPRESSION: 1. Interval placement of right basilar pigtail drainage catheter with significant interval decrease in size of the right pleural fluid collection with persistent stable right hydropneumothorax. 2. Persistent consolidation  over the right lower lobe with somewhat masslike appearance measuring 3.7 x 6 cm which is unchanged. This may represent rounded atelectasis, although underlying mass is possible. Recommend continued follow-up to resolution. 3. Slight interval worsening of small left pleural effusion. 4. Stable 2.3 cm right thyroid nodule. Recommend ultrasound evaluation on elective basis as suggested previously. 5. Stable cardiomegaly. Aortic atherosclerosis. Atherosclerotic coronary artery disease. Aortic Atherosclerosis (ICD10-I70.0). Electronically Signed   By: Elberta Fortis M.D.   On: 04/03/2023 12:24   DG Chest Port 1 View Result Date: 04/03/2023 CLINICAL DATA:  83 year old male with history of chest tube. EXAM: PORTABLE CHEST 1 VIEW COMPARISON:  Chest x-ray 04/01/2023. FINDINGS: Small bore right-sided chest tube noted with tip partially reformed projecting over the lower aspect of the right hemithorax. Right internal jugular Cordis with tip projecting over the expected location of the  distal superior vena cava. Small bore feeding tube extending into the stomach. Lung volumes are low. Loculated right pleural effusion moderate in size again noted. Small amount of loculated pneumothorax in the upper right hemithorax, similar to the recent prior study. Interstitial prominence an ill-defined opacities throughout the right lung which may reflect areas of atelectasis and/or consolidation. Left lung is clear. No definite left pleural effusion. No left pneumothorax. No evidence of pulmonary edema. Heart size is mildly enlarged. The patient is rotated to the left on today's exam, resulting in distortion of the mediastinal contours and reduced diagnostic sensitivity and specificity for mediastinal pathology. Atherosclerotic calcifications in the thoracic aorta. IMPRESSION: 1. Support apparatus, as above. 2. Loculated right-sided hydropneumothorax again noted, overall unchanged. Areas of atelectasis and/or consolidation in the  underlying right lung also similar to the prior study. 3. Mild cardiomegaly. 4. Aortic atherosclerosis. Electronically Signed   By: Trudie Reed M.D.   On: 04/03/2023 09:15    Medications: Infusions:   prismasol BGK 4/2.5 400 mL/hr at 04/04/23 0044    prismasol BGK 4/2.5 400 mL/hr at 04/04/23 0044    ceFAZolin (ANCEF) IV Stopped (04/03/23 2155)   feeding supplement (VITAL 1.5 CAL) 50 mL/hr at 04/04/23 0800   heparin 1,200 Units/hr (04/04/23 0800)   norepinephrine (LEVOPHED) Adult infusion 10 mcg/min (04/04/23 0800)   prismasol BGK 4/2.5 1,200 mL/hr at 04/04/23 0300    Scheduled Medications:  amiodarone  400 mg Oral BID   aspirin  81 mg Oral Daily   atorvastatin  40 mg Oral Daily   Chlorhexidine Gluconate Cloth  6 each Topical Daily   feeding supplement  237 mL Oral TID BM   feeding supplement (PROSource TF20)  60 mL Per Tube TID   folic acid  1 mg Oral Daily   insulin aspart  0-5 Units Subcutaneous QHS   insulin aspart  0-9 Units Subcutaneous TID WC   lidocaine  1 patch Transdermal Q24H   midodrine  10 mg Oral TID WC   multivitamin  1 tablet Oral BID   nicotine  21 mg Transdermal Daily   sodium chloride flush  10 mL Intrapleural Q8H   sodium chloride flush  10 mL Intrapleural Q8H   thiamine  100 mg Oral Daily   Or   thiamine  100 mg Intravenous Daily    have reviewed scheduled and prn medications.  Physical Exam: General:NAD, resting in bed Heart:normal rate, no rub Lungs: Right chest tube with pinkish drainage, no increased work of breathing Abdomen:soft, Non-tender, non-distended Extremities:no edema at the ankles bilaterally Dialysis Access: Right IJ temporary HD catheter placed by PCCM on 1/15.  Saren Corkern W 04/04/2023,8:10 AM  LOS: 6 days

## 2023-04-04 NOTE — Progress Notes (Signed)
PHARMACY - ANTICOAGULATION CONSULT NOTE  Pharmacy Consult for IV heparin Indication: atrial fibrillation  Allergies  Allergen Reactions   Allopurinol     Possible DRESS syndrome   Colchicine     Possible DRESS syndrome    Patient Measurements: Height: 6\' 1"  (185.4 cm) Weight: 88.1 kg (194 lb 3.6 oz) IBW/kg (Calculated) : 79.9 Heparin Dosing Weight: 89 kg  Vital Signs: Temp: 98.4 F (36.9 C) (01/20 1600) Temp Source: Axillary (01/20 1600) BP: 96/61 (01/20 1615) Pulse Rate: 96 (01/20 1615)  Labs: Recent Labs    04/02/23 0614 04/02/23 1613 04/03/23 0930 04/03/23 1607 04/03/23 2125 04/04/23 0729 04/04/23 1639  HGB 8.7*   < > 8.2* 8.8*  --  7.8*  --   HCT 27.5*   < > 25.6* 28.2*  --  24.0*  --   PLT 149*   < > 147* 198  --  174  --   APTT 54*  --  89*  --   --  >200*  --   HEPARINUNFRC  --   --   --   --  0.79* 0.73* 0.56  CREATININE  --    < > 1.44* 1.54*  --  1.50*  --    < > = values in this interval not displayed.    Estimated Creatinine Clearance: 43.6 mL/min (A) (by C-G formula based on SCr of 1.5 mg/dL (H)).   Medical History: Past Medical History:  Diagnosis Date   Acute renal failure (ARF) (HCC) 05/31/2019   B12 deficiency 08/02/2019   Bilateral carotid artery stenosis 05/31/2021   CAD (coronary artery disease) 2014   60-70% calcified LM lesion, high grade OM lesion, treated medically.   Chronic kidney disease, stage IV (severe) (HCC) 07/19/2019   Coronary artery disease involving native coronary artery of native heart without angina pectoris 07/19/2019   DM (diabetes mellitus) (HCC)    no longer on medications   Dyslipidemia 11/22/2019   Essential hypertension 07/19/2019   Gout    Gouty arthritis 09/06/2019   HTN (hypertension)    MSSA (methicillin susceptible Staphylococcus aureus) septicemia (HCC) 06/09/2012   Nonrheumatic aortic valve insufficiency 07/19/2019   OSA (obstructive sleep apnea) 04/19/2013   Dr Jerre Simon     PAF (paroxysmal atrial  fibrillation) (HCC) 06/03/2019   during admission for DRESS   Shock (HCC) 05/31/2019    Medications:  Infusions:    prismasol BGK 4/2.5 400 mL/hr at 04/04/23 0044    prismasol BGK 4/2.5 400 mL/hr at 04/04/23 1301    ceFAZolin (ANCEF) IV Stopped (04/04/23 1043)   feeding supplement (VITAL 1.5 CAL) 50 mL/hr at 04/04/23 1600   heparin 1,000 Units/hr (04/04/23 1600)   norepinephrine (LEVOPHED) Adult infusion 4 mcg/min (04/04/23 1600)   prismasol BGK 4/2.5 1,200 mL/hr at 04/04/23 1116   sodium PHOSPHATE IVPB (in mmol) 43 mL/hr at 04/04/23 1600    Assessment: 83 yo male with new afib, previously on heparin this admission, then it was held for intrapleural thrombolytics.  Now pharmacy asked to resume.  No overt bleeding or complications noted, CBC fairly stable. CRRT heparin syringe turned off earlier 1/19 @ 1200 - order now d/c.   HL 0.73 - slightly supratherapeutic off CRRT heparin.  APTT elevated out of proportion to heparin level.  1/20 PM update: Heparin level 0.56 (~8h level) is therapeutic after rate decrease to 1000 units/hr.  No issues noted.   Goal of Therapy:  Heparin level 0.3-0.7 units/ml Monitor platelets by anticoagulation protocol: Yes   Plan:  Continue heparin 1000 units/hr.   Daily heparin level and CBC.  Trixie Rude, PharmD Clinical Pharmacist 04/04/2023  5:11 PM

## 2023-04-04 NOTE — Plan of Care (Signed)
  Problem: Clinical Measurements: Goal: Ability to maintain clinical measurements within normal limits will improve Outcome: Progressing Goal: Respiratory complications will improve Outcome: Progressing   Problem: Nutrition: Goal: Adequate nutrition will be maintained Outcome: Progressing   Problem: Elimination: Goal: Will not experience complications related to bowel motility Outcome: Progressing

## 2023-04-04 NOTE — Progress Notes (Signed)
Physical Therapy Treatment Patient Details Name: Akshay Billa MRN: [id number] DOB: 05-17-1940 Today's Date: 04/04/2023   History of Present Illness Mr. Booton is an 83 year old gentleman admitted 1/14 presenting with greater than 2 months of progressive shortness of breath and leg edema. Over the last 2 weeks he has felt worse such that he is short of breath even at rest  He has been sleeping sitting upright sometimes in his car to prop him up enough. Pt also with afib. Pt with chest tube placed 1/14 due to pleural effusion. Pt  found to have AKI d/t ischemic ATN r/t septic shock with cultures growing staph aureus at chest tube 1/16. Pt transferred to ICU and CRRT initiated 1/16. PMH: A-fib many years ago, status post ablation and not recurrent so not on anticoagulation, CKD 3B    PT Comments  Pt progressing slowly both cognitively and functionally. Pt able to maintain EOB sitting balance with close contact guard this date and tolerate a std pvt transfer to recliner with maxAX2. Continue to recommend inpatient rehab program < 3 hrs a day to maximize functional recovery. Acute PT to cont to follow.    If plan is discharge home, recommend the following: Two people to help with walking and/or transfers;Assistance with cooking/housework;Assist for transportation;Help with stairs or ramp for entrance   Can travel by private vehicle     No  Equipment Recommendations  Other (comment) (defer to post acute)    Recommendations for Other Services       Precautions / Restrictions Precautions Precautions: Fall;Other (comment) Precaution Comments: watch BP and O2 Restrictions Weight Bearing Restrictions Per Provider Order: No Other Position/Activity Restrictions: R chest tube     Mobility  Bed Mobility Overal bed mobility: Needs Assistance Bed Mobility: Rolling, Supine to Sit     Supine to sit: +2 for physical assistance, +2 for safety/equipment, HOB elevated, Mod assist     General bed  mobility comments: with verbal cues pt able to move LEs towards EOB, pt pulled up on PT and 2nd person posteriorly to assist with trunk elevation and to scoot to EOB    Transfers Overall transfer level: Needs assistance Equipment used: Rolling walker (2 wheels) Transfers: Sit to/from Stand, Bed to chair/wheelchair/BSC Sit to Stand: +2 physical assistance, From elevated surface, Max assist Stand pivot transfers: Max assist, +2 physical assistance (RN to manage CRRT lines) Step pivot transfers: Min assist, +2 physical assistance, From elevated surface       General transfer comment: deferred this session for pt/therapist safety    Ambulation/Gait               General Gait Details: unable to amb at this time due to weakness and CRRT   Stairs             Wheelchair Mobility     Tilt Bed    Modified Rankin (Stroke Patients Only)       Balance Overall balance assessment: Needs assistance Sitting-balance support: Single extremity supported, Bilateral upper extremity supported, Feet supported Sitting balance-Leahy Scale: Fair Sitting balance - Comments: pt able to sit EOB with close contact guard   Standing balance support: Single extremity supported, Bilateral upper extremity supported, During functional activity, Reliant on assistive device for balance Standing balance-Leahy Scale: Poor Standing balance comment: reliant on external support                            Cognition Arousal: Alert Behavior During  Therapy: Flat affect Overall Cognitive Status: Impaired/Different from baseline Area of Impairment: Safety/judgement                   Current Attention Level: Focused Memory: Decreased short-term memory Following Commands: Follows one step commands with increased time Safety/Judgement: Decreased awareness of safety Awareness: Emergent Problem Solving: Slow processing, Decreased initiation, Difficulty sequencing, Requires verbal cues,  Requires tactile cues General Comments: pt able to follow commands        Exercises General Exercises - Lower Extremity Ankle Circles/Pumps: AROM, Both, 10 reps, Supine Quad Sets: AROM, Both, 5 reps, Supine Long Arc Quad: AROM, Right, Both, 10 reps, Seated    General Comments General comments (skin integrity, edema, etc.): VSS.      Pertinent Vitals/Pain Pain Assessment Pain Assessment: Faces Faces Pain Scale: Hurts little more Pain Location: R flank and R side of abdomen around Pain Descriptors / Indicators: Aching, Discomfort, Grimacing, Sore    Home Living                          Prior Function            PT Goals (current goals can now be found in the care plan section) Acute Rehab PT Goals Patient Stated Goal: to get stronger and be able to breathe PT Goal Formulation: With patient Time For Goal Achievement: 04/13/23 Potential to Achieve Goals: Good Progress towards PT goals: Progressing toward goals    Frequency    Min 1X/week      PT Plan      Co-evaluation              AM-PAC PT "6 Clicks" Mobility   Outcome Measure  Help needed turning from your back to your side while in a flat bed without using bedrails?: Total Help needed moving from lying on your back to sitting on the side of a flat bed without using bedrails?: Total Help needed moving to and from a bed to a chair (including a wheelchair)?: Total Help needed standing up from a chair using your arms (e.g., wheelchair or bedside chair)?: Total Help needed to walk in hospital room?: Total Help needed climbing 3-5 steps with a railing? : Total 6 Click Score: 6    End of Session Equipment Utilized During Treatment: Gait belt;Oxygen Activity Tolerance: Patient limited by fatigue Patient left: with call bell/phone within reach;with family/visitor present;in chair;with nursing/sitter in room Nurse Communication: Mobility status PT Visit Diagnosis: Unsteadiness on feet  (R26.81);Muscle weakness (generalized) (M62.81);Pain     Time: 5284-1324 PT Time Calculation (min) (ACUTE ONLY): 30 min  Charges:    $Therapeutic Exercise: 8-22 mins $Therapeutic Activity: 8-22 mins PT General Charges $$ ACUTE PT VISIT: 1 Visit                     Lewis Shock, PT, DPT Acute Rehabilitation Services Secure chat preferred Office #: [phone]    Iona Hansen 04/04/2023, 2:11 PM

## 2023-04-04 NOTE — Progress Notes (Addendum)
Advanced Heart Failure Rounding Note  Cardiologist: Rollene Rotunda, MD   Chief Complaint: A fib.  Subjective:   1/15 Started on CRRT  Remains on CRRT   On norep 8 mcg + midodrine 10 mg tid.    Denies SOB.    Objective:   Weight Range: 88.1 kg Body mass index is 25.62 kg/m.   Vital Signs:   Temp:  [97.9 F (36.6 C)-98.6 F (37 C)] 98.3 F (36.8 C) (01/20 0800) Pulse Rate:  [91-129] 110 (01/20 1030) Resp:  [5-52] 29 (01/20 1030) BP: (67-133)/(41-110) 107/65 (01/20 1030) SpO2:  [78 %-100 %] 98 % (01/20 1030) Weight:  [88.1 kg] 88.1 kg (01/20 0500) Last BM Date : 04/04/23  Weight change: Filed Weights   04/02/23 0305 04/03/23 0701 04/04/23 0500  Weight: 90.9 kg 89.9 kg 88.1 kg    Intake/Output:   Intake/Output Summary (Last 24 hours) at 04/04/2023 1056 Last data filed at 04/04/2023 1000 Gross per 24 hour  Intake 2796.48 ml  Output 2943 ml  Net -146.52 ml     CVP 7-8  Physical Exam   General:  On CRRT. No resp difficulty HEENT: + [place]  Neck: supple. no JVD. Carotids 2+ bilat; no bruits. No lymphadenopathy or thryomegaly appreciated. RIJ  Cor: PMI nondisplaced. Irregular rate & rhythm. No rubs, gallops or murmurs. Lungs: clear CT x1 with some bloody drainage. On room air.l  Abdomen: soft, nontender, nondistended. No hepatosplenomegaly. No bruits or masses. Good bowel sounds. Extremities: no cyanosis, clubbing, rash, edema Neuro: alert & orientedx3, cranial nerves grossly intact. moves all 4 extremities w/o difficulty. Affect flat   Telemetry  A fib 90-110s   Labs    CBC Recent Labs    04/03/23 1607 04/04/23 0729  WBC 12.2* 11.8*  HGB 8.8* 7.8*  HCT 28.2* 24.0*  MCV 102.2* 99.2  PLT 198 174   Basic Metabolic Panel Recent Labs    16/10/96 0930 04/03/23 1607 04/04/23 0729  NA 135 136 134*  K 4.5 4.5 4.5  CL 102 101 100  CO2 25 25 27   GLUCOSE 227* 179* 224*  BUN 22 25* 27*  CREATININE 1.44* 1.54* 1.50*  CALCIUM 7.5* 7.7* 7.6*  MG  2.3  --  2.3  PHOS 1.6* 2.8 1.4*   Liver Function Tests Recent Labs    04/03/23 1607 04/04/23 0729  ALBUMIN 2.1* 1.9*   No results for input(s): "LIPASE", "AMYLASE" in the last 72 hours. Cardiac Enzymes No results for input(s): "CKTOTAL", "CKMB", "CKMBINDEX", "TROPONINI" in the last 72 hours.  BNP: BNP (last 3 results) Recent Labs    03/30/23 0251  BNP 346.8*    ProBNP (last 3 results) No results for input(s): "PROBNP" in the last 8760 hours.   D-Dimer No results for input(s): "DDIMER" in the last 72 hours. Hemoglobin A1C No results for input(s): "HGBA1C" in the last 72 hours.  Fasting Lipid Panel No results for input(s): "CHOL", "HDL", "LDLCALC", "TRIG", "CHOLHDL", "LDLDIRECT" in the last 72 hours. Thyroid Function Tests No results for input(s): "TSH", "T4TOTAL", "T3FREE", "THYROIDAB" in the last 72 hours.  Invalid input(s): "FREET3"   Other results:   Imaging    DG Chest Port 1 View Result Date: 04/04/2023 CLINICAL DATA:  Chest tube in place.  Follow up pneumothorax. EXAM: PORTABLE CHEST 1 VIEW COMPARISON:  Radiographs 04/03/2023 and 04/01/2023.  CT 04/03/2023. FINDINGS: 0531 hours. Right IJ central venous catheter is unchanged, projecting to the lower SVC level. Feeding tube projects below the diaphragm. Small caliber pigtail  pleural catheter on the right is incompletely formed, although unchanged in position. Unchanged loculated hydropneumothorax on the right with apical and basilar components. There is partial collapse of the right lung with stable right basilar opacity. Stable mild left basilar atelectasis and small left pleural effusion. The heart size and mediastinal contours are stable. IMPRESSION: 1. Unchanged loculated hydropneumothorax on the right with apical and basilar components. Stable right basilar opacity. 2. Stable mild left basilar atelectasis and small left pleural effusion. 3. Stable support system. Electronically Signed   By: Carey Bullocks M.D.    On: 04/04/2023 09:51   CT CHEST WO CONTRAST Result Date: 04/03/2023 CLINICAL DATA:  Empyema evaluation. EXAM: CT CHEST WITHOUT CONTRAST TECHNIQUE: Multidetector CT imaging of the chest was performed following the standard protocol without IV contrast. RADIATION DOSE REDUCTION: This exam was performed according to the departmental dose-optimization program which includes automated exposure control, adjustment of the mA and/or kV according to patient size and/or use of iterative reconstruction technique. COMPARISON:  CT 03/29/2023, chest x-ray 04/01/2023 and 04/03/2023 FINDINGS: Cardiovascular: Stable cardiomegaly. Right IJ central venous catheter has tip over the SVC. Calcified plaque over the left main, left anterior descending and right coronary arteries. Thoracic aorta is normal in caliber. Mild calcified plaque over the descending thoracic aorta. Remaining vascular structures are unchanged. Mediastinum/Nodes: No significant mediastinal or hilar adenopathy. Remaining mediastinal structures are unremarkable. Stable 2.3 cm right thyroid nodule. Lungs/Pleura: Interval placement of right basilar pigtail drainage catheter. Significant interval decrease in size of the right pleural fluid collection with persistent small stable right apical hydropneumothorax. Small right lateral component of the right-sided hydropneumothorax present. Mild atelectasis over the right middle and upper lobes. Persistent consolidation over the right lower lobe with somewhat masslike appearance measuring 3.7 x 6 cm which is unchanged. This may represent rounded atelectasis, although underlying mass is possible. Left lung demonstrates slight interval worsening of a small pleural effusion and is otherwise clear. Mild dependent debris along the right lateral wall of the trachea likely aspirate material. Upper Abdomen: Calcified plaque over the abdominal aorta. Nasogastric tube is present with tip over the distal stomach. Gallbladder is  contracted. No acute findings. Musculoskeletal: Unchanged. IMPRESSION: 1. Interval placement of right basilar pigtail drainage catheter with significant interval decrease in size of the right pleural fluid collection with persistent stable right hydropneumothorax. 2. Persistent consolidation over the right lower lobe with somewhat masslike appearance measuring 3.7 x 6 cm which is unchanged. This may represent rounded atelectasis, although underlying mass is possible. Recommend continued follow-up to resolution. 3. Slight interval worsening of small left pleural effusion. 4. Stable 2.3 cm right thyroid nodule. Recommend ultrasound evaluation on elective basis as suggested previously. 5. Stable cardiomegaly. Aortic atherosclerosis. Atherosclerotic coronary artery disease. Aortic Atherosclerosis (ICD10-I70.0). Electronically Signed   By: Elberta Fortis M.D.   On: 04/03/2023 12:24     Medications:     Scheduled Medications:  amiodarone  400 mg Oral BID   aspirin  81 mg Oral Daily   atorvastatin  40 mg Oral Daily   Chlorhexidine Gluconate Cloth  6 each Topical Daily   feeding supplement  237 mL Oral TID BM   feeding supplement (PROSource TF20)  60 mL Per Tube TID   folic acid  1 mg Oral Daily   insulin aspart  0-5 Units Subcutaneous QHS   insulin aspart  0-9 Units Subcutaneous TID WC   lidocaine  1 patch Transdermal Q24H   midodrine  10 mg Oral TID WC   multivitamin  1 tablet Oral BID   nicotine  21 mg Transdermal Daily   sodium chloride flush  10 mL Intrapleural Q8H   sodium chloride flush  10 mL Intrapleural Q8H   thiamine  100 mg Oral Daily   Or   thiamine  100 mg Intravenous Daily    Infusions:   prismasol BGK 4/2.5 400 mL/hr at 04/04/23 0044    prismasol BGK 4/2.5 400 mL/hr at 04/04/23 0044    ceFAZolin (ANCEF) IV 2 g (04/04/23 1013)   feeding supplement (VITAL 1.5 CAL) 50 mL/hr at 04/04/23 1000   heparin 1,000 Units/hr (04/04/23 1000)   norepinephrine (LEVOPHED) Adult infusion 8  mcg/min (04/04/23 1000)   prismasol BGK 4/2.5 1,200 mL/hr at 04/04/23 0300   sodium PHOSPHATE IVPB (in mmol)      PRN Medications: acetaminophen **OR** acetaminophen, guaiFENesin-dextromethorphan, heparin, oxyCODONE, phenol    Patient Profile   83 y.o. 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 Admitted with large right pleural effusion>>empyema, Afib with RVR, AKI on CKD and volume overload.  Assessment/Plan  1. Atrial fibrillation: Mild RVR.  Prior history of PAF with DCCV.   Last in SR 2023.  Initially off heparin due to bloody chest tube drainage. Now on Heparin drip. Chest tube output slowing.  -  Continue amio 400 mg twice a day + heparin drip.  2. ID: Patient has right-sided empyema with MSSA.  He has back pain as well.  - Continue cefazolin for MSSA.  - Chest tube for empyema, bloody output.  -  MRI back - no osteomyelitis/abscess.  3. Shock: Suspect primarily septic shock with MSSA empyema.   -Remains on NE 8  mcg.  On ancef.  4. Acute on chronic diastolic CHF: With RV dysfunction.  Echo 1/25 with EF 50-55%, moderate LVH, moderate RV dysfunction, mild RV enlargement.  -Remains Norepi 8 mcg + midodrine 10 mg tid.  5. AKI on CKD stage 4: Suspect ATN in setting of shock, now on CVVH.  - Per Nephrology running even. CVP 6-7   6. ETOH use/abuse: CIWA protocol 7. CAD: Cath 2014 with 60-70% distal left main.  No intervention.  No chest pain. .  - Continue ASA 81 - Continue statin. 8. Anemia Hgb 7.8. Minimal chest tube drainage.   Length of Stay: 6  Amy Clegg, NP  04/04/2023, 10:56 AM  Advanced Heart Failure Team Pager [phone] (M-F; 7a - 5p)  Please contact [facility] for night-coverage after hours (5p -7a ) and weekends on [email or web address]  Agree with above.   83 y/o male with CAD (60-70% LM in 2014), PAF, CKD 4, ETOH abuse admitted with septic shock in setting or R-sided MSSA  empyema  Course c/b AKI -> ESRD and AF with RVR. Remains on CVVHD   Now s/p chest tube On NE 8 and midodrine 10 tid   On po amio and IV heparin. Remains in AF with mild RVR  General:  Elderly weak appearing No resp difficulty HEENT: normal + cor-trak  Neck: supple. +HD cath. Carotids 2+ bilat; no bruits. No lymphadenopathy or thryomegaly appreciated. Cor: Irregular rate & rhythm. No rubs, gallops or murmurs. Lungs: crackles on left + chest tube Abdomen: soft, nontender, nondistended. No hepatosplenomegaly. No bruits or masses. Good bowel sounds. Extremities: no cyanosis, clubbing, rash, edema Neuro: alert & orientedx3, cranial nerves grossly intact. moves all 4 extremities w/o difficulty. Affect pleasant  Remains pressor dependent.  Wean NE as tolerated. AF running 90-110 on po amio (appropriate for level of illness)  Continue IV heparin.   Continue to pull with CVVHD   Hs trop remain negative despite metabolic strain   Will likely need eventual TEE/DC-CV prior to d/c   D/w CCM  CRITICAL CARE Performed by: Arvilla Meres  Total critical care time: 33 minutes  Critical care time was exclusive of separately billable procedures and treating other patients.  Critical care was necessary to treat or prevent imminent or life-threatening deterioration.  Critical care was time spent personally by me (independent of midlevel providers or residents) on the following activities: development of treatment plan with patient and/or surrogate as well as nursing, discussions with consultants, evaluation of patient's response to treatment, examination of patient, obtaining history from patient or surrogate, ordering and performing treatments and interventions, ordering and review of laboratory studies, ordering and review of radiographic studies, pulse oximetry and re-evaluation of patient's condition.  Arvilla Meres, MD  12:36 PM

## 2023-04-04 NOTE — Progress Notes (Signed)
[facility]   Reason for visit: Follow up on empyema  Interval History: He continues to have the chest tube though decreased output, he remains in the ICU.  He remains on CRRT. Day 7 total antibiotics  Physical Exam: Constitutional:  Vitals:   04/04/23 1230 04/04/23 1245  BP: (!) 107/56 101/75  Pulse: 99 (!) 106  Resp: (!) 25 (!) 26  Temp:    SpO2: 100% 99%   patient appears in NAD Respiratory: Normal respiratory effort  Review of Systems: Constitutional: negative for fevers and chills  Lab Results  Component Value Date   WBC 11.8 (H) 04/04/2023   HGB 7.8 (L) 04/04/2023   HCT 24.0 (L) 04/04/2023   MCV 99.2 04/04/2023   PLT 174 04/04/2023    Lab Results  Component Value Date   CREATININE 1.50 (H) 04/04/2023   BUN 27 (H) 04/04/2023   NA 134 (L) 04/04/2023   K 4.5 04/04/2023   CL 100 04/04/2023   CO2 27 04/04/2023    Lab Results  Component Value Date   ALT 23 03/30/2023   AST 24 03/30/2023   ALKPHOS 68 03/30/2023     Microbiology: Recent Results (from the past 240 hours)  Body fluid culture w Gram Stain     Status: None   Collection Time: 03/29/23  4:24 PM   Specimen: Pleura; Body Fluid  Result Value Ref Range Status   Specimen Description PLEURAL  Final   Special Requests RIGHT  Final   Gram Stain   Final    FEW WBC PRESENT, PREDOMINANTLY PMN RARE GRAM POSITIVE COCCI CRITICAL RESULT CALLED TO, READ BACK BY AND VERIFIED WITH: RN MANDY BURNETTE ON 03/29/23 @ 1958 BY DRT Performed at [facility], [address] [place], [place] [address]    Culture MODERATE STAPHYLOCOCCUS AUREUS  Final   Report Status 03/31/2023 FINAL  Final   Organism ID, Bacteria STAPHYLOCOCCUS AUREUS  Final      Susceptibility   Staphylococcus aureus - MIC*    CIPROFLOXACIN <=0.5 SENSITIVE Sensitive     ERYTHROMYCIN <=0.25 SENSITIVE Sensitive     GENTAMICIN <=0.5 SENSITIVE Sensitive     OXACILLIN 0.5 SENSITIVE Sensitive     TETRACYCLINE <=1  SENSITIVE Sensitive     VANCOMYCIN 1 SENSITIVE Sensitive     TRIMETH/SULFA <=10 SENSITIVE Sensitive     CLINDAMYCIN <=0.25 SENSITIVE Sensitive     RIFAMPIN <=0.5 SENSITIVE Sensitive     Inducible Clindamycin NEGATIVE Sensitive     LINEZOLID 2 SENSITIVE Sensitive     * MODERATE STAPHYLOCOCCUS AUREUS  Culture, blood (Routine X 2) w Reflex to ID Panel     Status: None   Collection Time: 03/30/23  1:02 PM   Specimen: BLOOD LEFT HAND  Result Value Ref Range Status   Specimen Description BLOOD LEFT HAND  Final   Special Requests   Final    BOTTLES DRAWN AEROBIC AND ANAEROBIC Blood Culture results may not be optimal due to an inadequate volume of blood received in culture bottles   Culture   Final    NO GROWTH 5 DAYS Performed at [facility], [address], [place], [place] [address]    Report Status 04/04/2023 FINAL  Final  Culture, blood (Routine X 2) w Reflex to ID Panel     Status: None   Collection Time: 03/30/23  1:02 PM   Specimen: BLOOD RIGHT HAND  Result Value Ref Range Status   Specimen Description BLOOD  RIGHT HAND  Final   Special Requests   Final    BOTTLES DRAWN AEROBIC AND ANAEROBIC Blood Culture results may not be optimal due to an inadequate volume of blood received in culture bottles   Culture   Final    NO GROWTH 5 DAYS Performed at [facility], [address], [place], [place] [address]    Report Status 04/04/2023 FINAL  Final  MRSA Next Gen by PCR, Nasal     Status: None   Collection Time: 03/30/23  6:45 PM   Specimen: Nasal Mucosa; Nasal Swab  Result Value Ref Range Status   MRSA by PCR Next Gen NOT DETECTED NOT DETECTED Final    Comment: (NOTE) The GeneXpert MRSA Assay (FDA approved for NASAL specimens only), is one component of a comprehensive MRSA colonization surveillance program. It is not intended to diagnose MRSA infection nor to guide or monitor treatment for MRSA infections. Test performance is not FDA approved in patients less than 86  years old. Performed at [facility], [address], [place], [place] [address]     Impression/Plan:  1.  Empyema.  He has MSSA in culture and has been on cefazolin.  He is tolerating this well.  No changes to antibiotics.  I will have him take a prolonged course of approximately 2 to 3 weeks of antibiotics at discharge and can use cefadroxil.  I have personally spent 51 minutes involved in face-to-face and non-face-to-face activities for this patient on the day of the visit. Professional time spent includes the following activities: Preparing to see the patient (review of tests), Obtaining and/or reviewing separately obtained history (admission/discharge record), Performing a medically appropriate examination and/or evaluation , Ordering medications/tests/procedures, referring and communicating with other health care professionals, Documenting clinical information in the EMR, Independently interpreting results (not separately reported), Communicating results to the patient/family/caregiver, Counseling and educating the patient/family/caregiver and Care coordination (not separately reported).

## 2023-04-04 NOTE — Progress Notes (Signed)
PHARMACY - ANTICOAGULATION CONSULT NOTE  Pharmacy Consult for IV heparin Indication: atrial fibrillation  Allergies  Allergen Reactions   Allopurinol     Possible DRESS syndrome   Colchicine     Possible DRESS syndrome    Patient Measurements: Height: 6\' 1"  (185.4 cm) Weight: 88.1 kg (194 lb 3.6 oz) IBW/kg (Calculated) : 79.9 Heparin Dosing Weight: 89 kg  Vital Signs: Temp: 97.9 F (36.6 C) (01/20 1100) Temp Source: Axillary (01/20 1100) BP: 108/53 (01/20 1145) Pulse Rate: 100 (01/20 1145)  Labs: Recent Labs    04/02/23 0614 04/02/23 1613 04/03/23 0930 04/03/23 1607 04/03/23 2125 04/04/23 0729  HGB 8.7*   < > 8.2* 8.8*  --  7.8*  HCT 27.5*   < > 25.6* 28.2*  --  24.0*  PLT 149*   < > 147* 198  --  174  APTT 54*  --  89*  --   --  >200*  HEPARINUNFRC  --   --   --   --  0.79* 0.73*  CREATININE  --    < > 1.44* 1.54*  --  1.50*   < > = values in this interval not displayed.    Estimated Creatinine Clearance: 43.6 mL/min (A) (by C-G formula based on SCr of 1.5 mg/dL (H)).   Medical History: Past Medical History:  Diagnosis Date   Acute renal failure (ARF) (HCC) 05/31/2019   B12 deficiency 08/02/2019   Bilateral carotid artery stenosis 05/31/2021   CAD (coronary artery disease) 2014   60-70% calcified LM lesion, high grade OM lesion, treated medically.   Chronic kidney disease, stage IV (severe) (HCC) 07/19/2019   Coronary artery disease involving native coronary artery of native heart without angina pectoris 07/19/2019   DM (diabetes mellitus) (HCC)    no longer on medications   Dyslipidemia 11/22/2019   Essential hypertension 07/19/2019   Gout    Gouty arthritis 09/06/2019   HTN (hypertension)    MSSA (methicillin susceptible Staphylococcus aureus) septicemia (HCC) 06/09/2012   Nonrheumatic aortic valve insufficiency 07/19/2019   OSA (obstructive sleep apnea) 04/19/2013   Dr Jerre Simon     PAF (paroxysmal atrial fibrillation) (HCC) 06/03/2019   during  admission for DRESS   Shock (HCC) 05/31/2019    Medications:  Infusions:    prismasol BGK 4/2.5 400 mL/hr at 04/04/23 0044    prismasol BGK 4/2.5 400 mL/hr at 04/04/23 0044    ceFAZolin (ANCEF) IV Stopped (04/04/23 1043)   feeding supplement (VITAL 1.5 CAL) 50 mL/hr at 04/04/23 1100   heparin 1,000 Units/hr (04/04/23 1100)   norepinephrine (LEVOPHED) Adult infusion 7 mcg/min (04/04/23 1100)   prismasol BGK 4/2.5 1,200 mL/hr at 04/04/23 1116   sodium PHOSPHATE IVPB (in mmol)      Assessment: 83 yo male with new afib, previously on heparin this admission, then it was held for intrapleural thrombolytics.  Now pharmacy asked to resume.  No overt bleeding or complications noted, CBC fairly stable. CRRT heparin syringe turned off earlier 1/19 @ 1200 - order now d/c.   HL 0.73 - slightly supratherapeutic off CRRT heparin.  APTT elevated out of proportion to heparin level.  Goal of Therapy:  Heparin level 0.3-0.7 units/ml Monitor platelets by anticoagulation protocol: Yes   Plan:  Reduce heparin to 1000 units/hr.   Check heparin level in 8hrs (with AM labs) Daily heparin level and CBC.  Reece Leader, Colon Flattery, BCCP Clinical Pharmacist  04/04/2023 12:10 PM   [facility] pharmacy phone numbers are listed on [email or web address]

## 2023-04-05 ENCOUNTER — Inpatient Hospital Stay (HOSPITAL_COMMUNITY): Payer: Medicare Other

## 2023-04-05 DIAGNOSIS — J869 Pyothorax without fistula: Secondary | ICD-10-CM | POA: Diagnosis not present

## 2023-04-05 DIAGNOSIS — B9561 Methicillin susceptible Staphylococcus aureus infection as the cause of diseases classified elsewhere: Secondary | ICD-10-CM | POA: Diagnosis not present

## 2023-04-05 LAB — MAGNESIUM: Magnesium: 2.3 mg/dL (ref 1.7–2.4)

## 2023-04-05 LAB — RENAL FUNCTION PANEL
Albumin: 2 g/dL — ABNORMAL LOW (ref 3.5–5.0)
Anion gap: 9 (ref 5–15)
BUN: 39 mg/dL — ABNORMAL HIGH (ref 8–23)
CO2: 22 mmol/L (ref 22–32)
Calcium: 7.7 mg/dL — ABNORMAL LOW (ref 8.9–10.3)
Chloride: 102 mmol/L (ref 98–111)
Creatinine, Ser: 2.13 mg/dL — ABNORMAL HIGH (ref 0.61–1.24)
GFR, Estimated: 31 mL/min — ABNORMAL LOW (ref >60)
Glucose, Bld: 177 mg/dL — ABNORMAL HIGH (ref 70–99)
Phosphorus: 4.5 mg/dL (ref 2.5–4.6)
Potassium: 4.7 mmol/L (ref 3.5–5.1)
Sodium: 133 mmol/L — ABNORMAL LOW (ref 135–145)

## 2023-04-05 LAB — PHOSPHORUS
Phosphorus: 2 mg/dL — ABNORMAL LOW (ref 2.5–4.6)
Phosphorus: 2 mg/dL — ABNORMAL LOW (ref 2.5–4.6)

## 2023-04-05 LAB — GLUCOSE, CAPILLARY
Glucose-Capillary: 194 mg/dL — ABNORMAL HIGH (ref 70–99)
Glucose-Capillary: 240 mg/dL — ABNORMAL HIGH (ref 70–99)
Glucose-Capillary: 170 mg/dL — ABNORMAL HIGH (ref 70–99)
Glucose-Capillary: 179 mg/dL — ABNORMAL HIGH (ref 70–99)

## 2023-04-05 LAB — CBC
HCT: 23.5 % — ABNORMAL LOW (ref 39.0–52.0)
Hemoglobin: 7.6 g/dL — ABNORMAL LOW (ref 13.0–17.0)
MCH: 32.3 pg (ref 26.0–34.0)
MCHC: 32.3 g/dL (ref 30.0–36.0)
MCV: 100 fL (ref 80.0–100.0)
Platelets: 204 10*3/uL (ref 150–400)
RBC: 2.35 MIL/uL — ABNORMAL LOW (ref 4.22–5.81)
RDW: 17.2 % — ABNORMAL HIGH (ref 11.5–15.5)
WBC: 13.7 10*3/uL — ABNORMAL HIGH (ref 4.0–10.5)
nRBC: 1 % — ABNORMAL HIGH (ref 0.0–0.2)

## 2023-04-05 LAB — HEPARIN LEVEL (UNFRACTIONATED): Heparin Unfractionated: 0.45 [IU]/mL (ref 0.30–0.70)

## 2023-04-05 LAB — BASIC METABOLIC PANEL
Anion gap: 6 (ref 5–15)
BUN: 27 mg/dL — ABNORMAL HIGH (ref 8–23)
CO2: 26 mmol/L (ref 22–32)
Calcium: 7.5 mg/dL — ABNORMAL LOW (ref 8.9–10.3)
Chloride: 101 mmol/L (ref 98–111)
Creatinine, Ser: 1.56 mg/dL — ABNORMAL HIGH (ref 0.61–1.24)
GFR, Estimated: 44 mL/min — ABNORMAL LOW (ref >60)
Glucose, Bld: 230 mg/dL — ABNORMAL HIGH (ref 70–99)
Potassium: 4.5 mmol/L (ref 3.5–5.1)
Sodium: 133 mmol/L — ABNORMAL LOW (ref 135–145)

## 2023-04-05 LAB — ALBUMIN: Albumin: 1.9 g/dL — ABNORMAL LOW (ref 3.5–5.0)

## 2023-04-05 LAB — APTT: aPTT: 123 s — ABNORMAL HIGH (ref 24–36)

## 2023-04-05 MED ORDER — ORAL CARE MOUTH RINSE
15.0000 mL | OROMUCOSAL | Status: DC
  Administered 2023-04-05 – 2023-04-16 (×43): 15 mL via OROMUCOSAL

## 2023-04-05 MED ORDER — ORAL CARE MOUTH RINSE
15.0000 mL | OROMUCOSAL | Status: AC | PRN
Start: 2023-04-05 — End: ?

## 2023-04-05 MED ORDER — NOREPINEPHRINE 16 MG/250ML-% IV SOLN: 0.0000 ug/min | INTRAVENOUS | Status: DC

## 2023-04-05 MED ORDER — OXYCODONE HCL 5 MG PO TABS
5.0000 mg | ORAL_TABLET | ORAL | Status: DC | PRN
  Administered 2023-04-05 – 2023-04-06 (×3): 5 mg via ORAL
  Administered 2023-04-06 – 2023-04-07 (×2): 10 mg via ORAL
  Administered 2023-04-08 – 2023-04-10 (×4): 5 mg via ORAL
  Administered 2023-04-10: 10 mg via ORAL
  Filled 2023-04-05: qty 2
  Filled 2023-04-05 (×4): qty 1
  Filled 2023-04-05: qty 2
  Filled 2023-04-05 (×2): qty 1
  Filled 2023-04-05: qty 2
  Filled 2023-04-05: qty 1

## 2023-04-05 MED ORDER — METHOCARBAMOL 1000 MG/10ML IJ SOLN: 500.0000 mg | Freq: Three times a day (TID) | INTRAMUSCULAR | Status: DC | PRN

## 2023-04-05 MED ORDER — METHOCARBAMOL 500 MG PO TABS
500.0000 mg | ORAL_TABLET | Freq: Three times a day (TID) | ORAL | Status: DC | PRN
  Administered 2023-04-05 – 2023-04-10 (×2): 500 mg via ORAL
  Filled 2023-04-05 (×2): qty 1

## 2023-04-05 MED ORDER — MIDODRINE HCL 5 MG PO TABS
15.0000 mg | ORAL_TABLET | Freq: Three times a day (TID) | ORAL | Status: DC
  Administered 2023-04-05 – 2023-04-11 (×19): 15 mg via ORAL
  Filled 2023-04-05 (×20): qty 3

## 2023-04-05 MED ORDER — DEXTROSE 5 % IV SOLN
30.0000 mmol | Freq: Once | INTRAVENOUS | Status: AC
  Administered 2023-04-05: 30 mmol via INTRAVENOUS
  Filled 2023-04-05: qty 10

## 2023-04-05 NOTE — Progress Notes (Signed)
[facility] NEPHROLOGY PROGRESS NOTE  Assessment/ Plan: Pt is a 83 y.o. yo male  with PMH significant for hypertension, type II DM, CAD, A-fib, CKD (f/b Dr. Glenna Fellows) was admitted with shortness of breath, seen as a consultation for the evaluation and management of acute kidney injury.   # Acute kidney injury on CKD IV b/l cr around 2.5-2.9: AKI likely ischemic ATN due to septic shock.  Kidney ultrasound with no hydronephrosis.  Refractory to diuretics and remained hypotensive therefore moved to ICU on 1/15.  During this hospitalization -8.4 L.  Initiated CRRT (500/300/1500) on 1/15-1/21 after placement of temporary HD catheter by PCCM.    CVP 5; if UOP does not increase definitely over the next 24 to 48 hours we will schedule exchange to a tunneled catheter by VIR later in the week Th afternoon or Friday. WBC 13.7 but afebrile.  No urine output at this time he feels he needs to go; will check PVR monitor closely over next 24 to 48 hours. Off pressors  # Septic shock/staph bacteremia: Pressors per primary team.  Antibiotics per primary team  # Right pleural effusion status post right chest tube placement and drainage of fluid.  Plan per PCCM.   # Peripheral edema/fluid overload: Did not respond with IV diuretics and unable to use further diuresis because of persistent hypotension.  Started CRRT as discussed above.   # Acute hypoxic respiratory failure: Wean oxygen as able # Acute CHF: optimizing volume status w/ crrt # A-fib with RVR  Subjective: Resting today with no complaints. Off Levo; CRRT stopped 1/21 AM  Objective Vital signs in last 24 hours: Vitals:   04/05/23 0945 04/05/23 1000 04/05/23 1015 04/05/23 1030  BP: (!) 96/52 (!) 92/53 (!) 96/58 (!) 91/55  Pulse: 89 94 97 93  Resp: 20 (!) 0 (!) 24 (!) 27  Temp:      TempSrc:      SpO2: 95% 95% 95% 94%  Weight:      Height:       Weight change: -1.7 kg  Intake/Output Summary (Last 24 hours) at 04/05/2023  1056 Last data filed at 04/05/2023 1000 Gross per 24 hour  Intake 2510.94 ml  Output 2319.3 ml  Net 191.64 ml       Labs: RENAL PANEL Recent Labs  Lab 04/02/23 0609 04/02/23 1611 04/02/23 1613 04/03/23 0930 04/03/23 1607 04/04/23 0729 04/04/23 1639 04/05/23 0307 04/05/23 0308  NA 134*  --  135 135 136 134* 134*  --  133*  K 5.1  --  4.4 4.5 4.5 4.5 4.4  --  4.5  CL 102  --  101 102 101 100 99  --  101  CO2 26  --  27 25 25 27 26   --  26  GLUCOSE 203*  --  196* 227* 179* 224* 235*  --  230*  BUN 24*  --  24* 22 25* 27* 27*  --  27*  CREATININE 1.98*  --  1.84* 1.44* 1.54* 1.50* 1.53*  --  1.56*  CALCIUM 8.0*  --  7.8* 7.5* 7.7* 7.6* 7.6*  --  7.5*  MG 2.5* 2.5*  --  2.3  --  2.3  --   --  2.3  PHOS 1.8* 3.0 3.0 1.6* 2.8 1.4* 2.3* 2.0* 2.0*  ALBUMIN 2.2*  --  2.2*  --  2.1* 1.9* 2.0* 1.9*  --     Liver Function Tests: Recent Labs  Lab 03/29/23 1832 03/30/23 0251 03/31/23 0241 04/04/23  4098 04/04/23 1639 04/05/23 0307  AST  --  24  --   --   --   --   ALT  --  23  --   --   --   --   ALKPHOS  --  68  --   --   --   --   BILITOT  --  1.3*  --   --   --   --   PROT 6.2* 6.5  --   --   --   --   ALBUMIN  --  2.7*   < > 1.9* 2.0* 1.9*   < > = values in this interval not displayed.   No results for input(s): "LIPASE", "AMYLASE" in the last 168 hours. No results for input(s): "AMMONIA" in the last 168 hours. CBC: Recent Labs    04/02/23 1613 04/03/23 0930 04/03/23 1607 04/04/23 0729 04/05/23 0308  HGB 8.8* 8.2* 8.8* 7.8* 7.6*  MCV 101.5* 101.2* 102.2* 99.2 100.0    Cardiac Enzymes: No results for input(s): "CKTOTAL", "CKMB", "CKMBINDEX", "TROPONINI" in the last 168 hours. CBG: Recent Labs  Lab 04/04/23 0642 04/04/23 1127 04/04/23 1548 04/04/23 2109 04/05/23 0631  GLUCAP 207* 197* 211* 193* 194*    Iron Studies: No results for input(s): "IRON", "TIBC", "TRANSFERRIN", "FERRITIN" in the last 72 hours. Studies/Results: DG Chest Port 1 View Result  Date: 04/05/2023 CLINICAL DATA:  Evaluate for hydropneumothorax. EXAM: PORTABLE CHEST 1 VIEW COMPARISON:  Chest radiograph dated 04/04/2023. FINDINGS: Right IJ central venous line and feeding tube are in similar position. Interval removal of the right chest tube. No significant interval change in the size of the right hydropneumothorax. Small left pleural effusion. Stable cardiomediastinal silhouette. No acute osseous pathology. IMPRESSION: Interval removal of the right chest tube. No significant interval change in the size of the right hydropneumothorax. Electronically Signed   By: Elgie Collard M.D.   On: 04/05/2023 09:42   DG Chest Port 1 View Result Date: 04/04/2023 CLINICAL DATA:  Chest tube in place.  Follow up pneumothorax. EXAM: PORTABLE CHEST 1 VIEW COMPARISON:  Radiographs 04/03/2023 and 04/01/2023.  CT 04/03/2023. FINDINGS: 0531 hours. Right IJ central venous catheter is unchanged, projecting to the lower SVC level. Feeding tube projects below the diaphragm. Small caliber pigtail pleural catheter on the right is incompletely formed, although unchanged in position. Unchanged loculated hydropneumothorax on the right with apical and basilar components. There is partial collapse of the right lung with stable right basilar opacity. Stable mild left basilar atelectasis and small left pleural effusion. The heart size and mediastinal contours are stable. IMPRESSION: 1. Unchanged loculated hydropneumothorax on the right with apical and basilar components. Stable right basilar opacity. 2. Stable mild left basilar atelectasis and small left pleural effusion. 3. Stable support system. Electronically Signed   By: Carey Bullocks M.D.   On: 04/04/2023 09:51   CT CHEST WO CONTRAST Result Date: 04/03/2023 CLINICAL DATA:  Empyema evaluation. EXAM: CT CHEST WITHOUT CONTRAST TECHNIQUE: Multidetector CT imaging of the chest was performed following the standard protocol without IV contrast. RADIATION DOSE REDUCTION:  This exam was performed according to the departmental dose-optimization program which includes automated exposure control, adjustment of the mA and/or kV according to patient size and/or use of iterative reconstruction technique. COMPARISON:  CT 03/29/2023, chest x-ray 04/01/2023 and 04/03/2023 FINDINGS: Cardiovascular: Stable cardiomegaly. Right IJ central venous catheter has tip over the SVC. Calcified plaque over the left main, left anterior descending and right coronary arteries. Thoracic aorta is normal  in caliber. Mild calcified plaque over the descending thoracic aorta. Remaining vascular structures are unchanged. Mediastinum/Nodes: No significant mediastinal or hilar adenopathy. Remaining mediastinal structures are unremarkable. Stable 2.3 cm right thyroid nodule. Lungs/Pleura: Interval placement of right basilar pigtail drainage catheter. Significant interval decrease in size of the right pleural fluid collection with persistent small stable right apical hydropneumothorax. Small right lateral component of the right-sided hydropneumothorax present. Mild atelectasis over the right middle and upper lobes. Persistent consolidation over the right lower lobe with somewhat masslike appearance measuring 3.7 x 6 cm which is unchanged. This may represent rounded atelectasis, although underlying mass is possible. Left lung demonstrates slight interval worsening of a small pleural effusion and is otherwise clear. Mild dependent debris along the right lateral wall of the trachea likely aspirate material. Upper Abdomen: Calcified plaque over the abdominal aorta. Nasogastric tube is present with tip over the distal stomach. Gallbladder is contracted. No acute findings. Musculoskeletal: Unchanged. IMPRESSION: 1. Interval placement of right basilar pigtail drainage catheter with significant interval decrease in size of the right pleural fluid collection with persistent stable right hydropneumothorax. 2. Persistent  consolidation over the right lower lobe with somewhat masslike appearance measuring 3.7 x 6 cm which is unchanged. This may represent rounded atelectasis, although underlying mass is possible. Recommend continued follow-up to resolution. 3. Slight interval worsening of small left pleural effusion. 4. Stable 2.3 cm right thyroid nodule. Recommend ultrasound evaluation on elective basis as suggested previously. 5. Stable cardiomegaly. Aortic atherosclerosis. Atherosclerotic coronary artery disease. Aortic Atherosclerosis (ICD10-I70.0). Electronically Signed   By: Elberta Fortis M.D.   On: 04/03/2023 12:24    Medications: Infusions:   ceFAZolin (ANCEF) IV 2 g (04/05/23 1014)   feeding supplement (VITAL 1.5 CAL) 50 mL/hr at 04/05/23 1000   heparin 1,000 Units/hr (04/05/23 1000)   sodium PHOSPHATE IVPB (in mmol)      Scheduled Medications:  amiodarone  400 mg Oral BID   aspirin  81 mg Oral Daily   atorvastatin  40 mg Oral Daily   Chlorhexidine Gluconate Cloth  6 each Topical Daily   feeding supplement  237 mL Oral TID BM   feeding supplement (PROSource TF20)  60 mL Per Tube TID   folic acid  1 mg Oral Daily   insulin aspart  0-5 Units Subcutaneous QHS   insulin aspart  0-9 Units Subcutaneous TID WC   lidocaine  1 patch Transdermal Q24H   midodrine  15 mg Oral TID WC   multivitamin  1 tablet Oral BID   nicotine  21 mg Transdermal Daily   thiamine  100 mg Oral Daily    have reviewed scheduled and prn medications.  Physical Exam: General:NAD, resting in bedside chair Heart:normal rate, no rub Lungs: Coarse BS Abdomen:soft, Non-tender, non-distended Extremities:no edema at the ankles bilaterally Dialysis Access: Right IJ temporary HD catheter placed by PCCM on 1/15.  Cydnee Fuquay W 04/05/2023,10:56 AM  LOS: 7 days

## 2023-04-05 NOTE — Progress Notes (Signed)
NAME:  Zachary Parrish, MRN:  [id number], DOB:  06-09-1940, LOS: 7 ADMISSION DATE:  03/29/2023, CONSULTATION DATE:  1/14 REFERRING MD:  Miquel Dunn, CHIEF COMPLAINT:  EFFUSION    History of Present Illness:  83 year old male who presents to the emergency room with chief complaint of approximately 2 and half months of progressive exertional dyspnea.  Initially noted shortness of breath back in early November primarily with activity, around Thanksgiving time reporting fairly significant increased work of breathing to the point that family noticed, of note about 1 month prior to that certain to notice some mild increase lower extremity edema.  By Christmas time his shortness of breath had gotten to the point of resting dyspnea, he was also transition to 4 pillow orthopnea and at times would actually go out and sleep in his car to be in the upright position.  He has had productive cough with clear mucus, no chest pain, no palpitations.  No sick exposures.  His legs have become more painful over the last week, and he presents to the emergency room primarily because he can only speak in 2-3 word phrases and was significantly weak and orthopneic.  In the ER Evaluation Was initiated this demonstrated the following: Serum creatinine 2.83 is not far from his baseline, glucose 155 white blood cell count 14 troponin I of 23, a chest x-ray was obtained this demonstrated a large right pleural effusion because of this a CT of chest was obtained to further evaluate.  CT of chest demonstrated a large right pleural effusion with pleural thickening and rind, some basilar consolidated atelectasis which appeared rounded in nature, and a very small left pleural effusion he also had a 2.5 cm cystic lesion on his right thyroid.  Because of the pleural effusion and dyspnea pulmonary was asked to evaluate.  Pertinent  Medical History  Atrial fibrillation not on anticoagulation but has had prior cardioversion, carotid stenosis, CKD stage IIIb,  hyperlipidemia, coronary artery disease, type 2 diabetes, hypertension   Significant Hospital Events: Including procedures, antibiotic start and stop dates in addition to other pertinent events   1/14 admitted with progressive subacute dyspnea and large chronic appearing pleural effusion with rind on the right.  Thoracostomy tube placed in the emergency room  Interim History / Subjective:  Chest tube came out yesterday afternoon CXR this morning looks about the same if not a little better CRRT off Was off pressors briefly this morning, but needed to go back on 2 mcg levophed.  Poor PO intake TF ongoing   Objective   Blood pressure (!) 108/53, pulse (!) 105, temperature 98.2 F (36.8 C), temperature source Oral, resp. rate 11, height 6\' 1"  (1.854 m), weight 88.2 kg, SpO2 95%. CVP:  [5 mmHg-11 mmHg] 5 mmHg      Intake/Output Summary (Last 24 hours) at 04/05/2023 2952 Last data filed at 04/05/2023 0800 Gross per 24 hour  Intake 2408.63 ml  Output 2450.3 ml  Net -41.67 ml   Filed Weights   04/03/23 0701 04/04/23 0500 04/05/23 0500  Weight: 89.9 kg 88.1 kg 88.2 kg    Examination: General:  elderly male resting comfortably in bed Neuro:  Alert, oriented, non-focal HEENT:  [place]/AT, PERRL, no JVD Cardiovascular: IRIR no MRG Lungs:  Clear anterior lung fields Abdomen:  Soft, NT, ND Musculoskeletal:  No acute deformity or ROM limitation. No edema.  Skin:  Intact. MMM   Hgb down to 7.6 Cr 1.56 Phos 2 APPT 123  Resolved Hospital Problem list  Assessment & Plan:    Acute hypoxic respiratory failure due to right-sided empyema Empyema MSSA CT shows improvement of R empyema. Still some residual hydropneumo, trapped lung.  - on room air now - Pulmonary hygiene - Encourage IS - Chest tube out. CXR looks about the same maybe a little better. Imaging PRN.   Septic shock due to MSSA empyema status post chest tube placement, not POA - Continue Chest tune to suction for  source control.  - Ancef. ID recommending 2-3 weeks of antibiotics post discharge cefadroxil.  - NE weaning for MAP 65 ( ). Midodrine increase to 15 TID  Acute right-sided systolic heart failure Chronic atrial fibrillation - Will hope his UOP picks up to allow for volume status management.  - Heparin infusion  AKI on CKD stage IV due to cardiorenal syndrome versus septic ATN Hypervolemic hyponatremia/Hypocalcemia Urinary retention - CRRT off today - Trend chemistry - I&O, bladder scans.  - Hope he can avoid HD  Anemia and thrombocytopenia of chronic disease - Trend H&H - Transfuse per usual guidelines.   DM type 2 - CBG monitoring and SSI  Back pain- MRI neg  Acute alcohol w/d delirium- +/- ICU delirium - Was pretty sleepy on phenobarb. Now off. Much more awake.  - PRN benzo  Tobacco dependence - nicotine patch  Dysphagia - swallowing  better - Decrease TF to hopefully promote more PO intake.   Thyroid lesion - follow up when appropriate    Best Practice (right click and "Reselect all SmartList Selections" daily)  Diet/type: Regular consistency + TF DVT prophylaxis: heparin  gtt GI prophylaxis: N/A Lines: HD catheter, and yes and it is still needed Foley:  No Code Status:  full code Last date of multidisciplinary goals of care discussion: 1/19: wife updated at bedside  Critical care time 37 minutes  Joneen Roach, AGACNP-BC [facility]  See Amion for personal pager PCCM on call pager [phone] until 7pm. Please call Elink 7p-7a. [phone]  04/05/2023 9:27 AM

## 2023-04-05 NOTE — Plan of Care (Signed)
  Problem: Education: Goal: Knowledge of disease and its progression will improve Outcome: Progressing Goal: Individualized Educational Video(s) Outcome: Progressing   Problem: Health Behavior/Discharge Planning: Goal: Ability to manage health-related needs will improve Outcome: Progressing   Problem: Clinical Measurements: Goal: Complications related to the disease process, condition or treatment will be avoided or minimized Outcome: Progressing   Problem: Fluid Volume: Goal: Compliance with measures to maintain balanced fluid volume will improve Outcome: Completed/Met

## 2023-04-05 NOTE — Progress Notes (Addendum)
Advanced Heart Failure Rounding Note  Cardiologist: Rollene Rotunda, MD   Chief Complaint: A fib  Subjective:   1/15 Started on CRRT 1/20 Chest tube removed.  1/21 CRRT stopped.   Off Norepi this morning.   Continue heparin drip.   Denies SOB.   Objective:   Weight Range: 88.2 kg Body mass index is 25.65 kg/m.   Vital Signs:   Temp:  [97.6 F (36.4 C)-98.7 F (37.1 C)] 98.2 F (36.8 C) (01/21 0800) Pulse Rate:  [84-118] 89 (01/21 0945) Resp:  [6-36] 20 (01/21 0945) BP: (82-131)/(42-93) 96/52 (01/21 0945) SpO2:  [93 %-100 %] 95 % (01/21 0945) Weight:  [88.2 kg] 88.2 kg (01/21 0500) Last BM Date : 04/04/23  Weight change: Filed Weights   04/03/23 0701 04/04/23 0500 04/05/23 0500  Weight: 89.9 kg 88.1 kg 88.2 kg    Intake/Output:   Intake/Output Summary (Last 24 hours) at 04/05/2023 1003 Last data filed at 04/05/2023 0800 Gross per 24 hour  Intake 2270 ml  Output 2319.3 ml  Net -49.3 ml     CVP 5-6  Physical Exam  General:  In the chair.  No resp difficulty HEENT: + [place]  Neck: supple. no JVD. Carotids 2+ bilat; no bruits. No lymphadenopathy or thryomegaly appreciated. RIJ  Cor: PMI nondisplaced. Irregular rate & rhythm. No rubs, gallops or murmurs. Lungs: clear Abdomen: soft, nontender, nondistended. No hepatosplenomegaly. No bruits or masses. Good bowel sounds. Extremities: no cyanosis, clubbing, rash, edema Neuro: alert & orientedx3, cranial nerves grossly intact. moves all 4 extremities w/o difficulty. Affect pleasant  EKG-  A fib 90-100s   Labs    CBC Recent Labs    04/04/23 0729 04/05/23 0308  WBC 11.8* 13.7*  HGB 7.8* 7.6*  HCT 24.0* 23.5*  MCV 99.2 100.0  PLT 174 204   Basic Metabolic Panel Recent Labs    16/10/96 0729 04/04/23 1639 04/05/23 0307 04/05/23 0308  NA 134* 134*  --  133*  K 4.5 4.4  --  4.5  CL 100 99  --  101  CO2 27 26  --  26  GLUCOSE 224* 235*  --  230*  BUN 27* 27*  --  27*  CREATININE 1.50* 1.53*  --   1.56*  CALCIUM 7.6* 7.6*  --  7.5*  MG 2.3  --   --  2.3  PHOS 1.4* 2.3* 2.0* 2.0*   Liver Function Tests Recent Labs    04/04/23 1639 04/05/23 0307  ALBUMIN 2.0* 1.9*   No results for input(s): "LIPASE", "AMYLASE" in the last 72 hours. Cardiac Enzymes No results for input(s): "CKTOTAL", "CKMB", "CKMBINDEX", "TROPONINI" in the last 72 hours.  BNP: BNP (last 3 results) Recent Labs    03/30/23 0251  BNP 346.8*    ProBNP (last 3 results) No results for input(s): "PROBNP" in the last 8760 hours.   D-Dimer No results for input(s): "DDIMER" in the last 72 hours. Hemoglobin A1C No results for input(s): "HGBA1C" in the last 72 hours.  Fasting Lipid Panel No results for input(s): "CHOL", "HDL", "LDLCALC", "TRIG", "CHOLHDL", "LDLDIRECT" in the last 72 hours. Thyroid Function Tests No results for input(s): "TSH", "T4TOTAL", "T3FREE", "THYROIDAB" in the last 72 hours.  Invalid input(s): "FREET3"   Other results:   Imaging    DG Chest Port 1 View Result Date: 04/05/2023 CLINICAL DATA:  Evaluate for hydropneumothorax. EXAM: PORTABLE CHEST 1 VIEW COMPARISON:  Chest radiograph dated 04/04/2023. FINDINGS: Right IJ central venous line and feeding tube are in  similar position. Interval removal of the right chest tube. No significant interval change in the size of the right hydropneumothorax. Small left pleural effusion. Stable cardiomediastinal silhouette. No acute osseous pathology. IMPRESSION: Interval removal of the right chest tube. No significant interval change in the size of the right hydropneumothorax. Electronically Signed   By: Elgie Collard M.D.   On: 04/05/2023 09:42     Medications:     Scheduled Medications:  amiodarone  400 mg Oral BID   aspirin  81 mg Oral Daily   atorvastatin  40 mg Oral Daily   Chlorhexidine Gluconate Cloth  6 each Topical Daily   feeding supplement  237 mL Oral TID BM   feeding supplement (PROSource TF20)  60 mL Per Tube TID   folic  acid  1 mg Oral Daily   insulin aspart  0-5 Units Subcutaneous QHS   insulin aspart  0-9 Units Subcutaneous TID WC   lidocaine  1 patch Transdermal Q24H   midodrine  15 mg Oral TID WC   multivitamin  1 tablet Oral BID   nicotine  21 mg Transdermal Daily   thiamine  100 mg Oral Daily   Or   thiamine  100 mg Intravenous Daily    Infusions:   prismasol BGK 4/2.5 400 mL/hr at 04/05/23 0229    prismasol BGK 4/2.5 400 mL/hr at 04/05/23 4166    ceFAZolin (ANCEF) IV Stopped (04/04/23 2145)   feeding supplement (VITAL 1.5 CAL) 1,000 mL (04/05/23 0958)   heparin 1,000 Units/hr (04/05/23 0800)   norepinephrine (LEVOPHED) Adult infusion 2 mcg/min (04/05/23 0800)   prismasol BGK 4/2.5 1,200 mL/hr at 04/05/23 0408   sodium PHOSPHATE IVPB (in mmol)      PRN Medications: acetaminophen **OR** acetaminophen, guaiFENesin-dextromethorphan, heparin, oxyCODONE, phenol    Patient Profile   83 y.o. 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 Admitted with large right pleural effusion>>empyema, Afib with RVR, AKI on CKD and volume overload.  Assessment/Plan  1. Atrial fibrillation: Mild RVR.  Prior history of PAF with DCCV.   Last in SR 2023.  Initially off heparin due to bloody chest tube drainage. Now on Heparin drip.  -  Continue amio 400 mg twice a day + heparin drip.  2. ID: Patient has right-sided empyema with MSSA.  He has back pain as well.  - Continue cefazolin for MSSA.  - Chest tube for empyema, bloody output. Chest Tube removed  -  MRI back - no osteomyelitis/abscess. - ID following.   3. Shock: Suspect primarily septic shock with MSSA empyema.   Now off pressors. Continue midodrine.  4. Acute on chronic diastolic CHF: With RV dysfunction.  Echo 1/25 with EF 50-55%, moderate LVH, moderate RV dysfunction, mild RV enlargement.  - CVP 5-6. CRRT stopped this morning.  - Norepi off this morning.  Continue midodrine 15 mg tid.  5. AKI on CKD stage 4: Suspect ATN in setting of shock.  CRRT stopped this morning. CVP 5-6.   - Per Nephrology.  6. ETOH use/abuse: CIWA protocol 7. CAD: Cath 2014 with 60-70% distal left main.  No intervention.  No chest pain. .  - Continue ASA 81 - Continue statin. 8. Anemia Hgb 7.6    Continue to mobilize.   Length of Stay: 7  Amy Clegg, NP  04/05/2023, 10:03 AM  Advanced Heart Failure Team Pager [phone] (M-F; 7a - 5p)  Please contact  [facility] for night-coverage after hours (5p -7a ) and weekends on [email or web address] Critical care time was exclusive of separately billable procedures and treating other patients.  Patient seen and examined with the above-signed Advanced Practice Provider and/or Housestaff. I personally reviewed laboratory data, imaging studies and relevant notes. I independently examined the patient and formulated the important aspects of the plan. I have edited the note to reflect any of my changes or salient points. I have personally discussed the plan with the patient and/or family.  CT out yesterday. Off NE this am. CVVHD paused this am. CVP 5-6   Feeling better. Weak,   Remains in AF but rate controlled on po amio. On heparin.   General: Sitting in chair Weak appearing. No resp difficulty HEENT: normal + C[place]trak Neck: supple. no JVD. Carotids 2+ bilat; no bruits. No lymphadenopathy or thryomegaly appreciated. Cor: PMI nondisplaced. Irregular rate & rhythm. No rubs, gallops or murmurs. Lungs: clear Abdomen: soft, nontender, nondistended. No hepatosplenomegaly. No bruits or masses. Good bowel sounds. Extremities: no cyanosis, clubbing, rash, edema Neuro: alert & orientedx3, cranial nerves grossly intact. moves all 4 extremities w/o difficulty. Affect pleasant  Volume status much improved. Remains anuric.  Remains in AF but rate controlled.   Eventually can consider TEE/DC-CV once he is on stable oral AC (likely will need  tunneled HD cath)  D/w Renal and CCM   AHF team will follow from a distance.   Arvilla Meres, MD  11:04 AM

## 2023-04-05 NOTE — Progress Notes (Signed)
[facility]   Reason for visit: Follow-up on empyema  Interval History: He no longer is on CRRT.  BBC of 13.7.  Remains afebrile. Day 8 total antibiotics  Physical Exam: Constitutional:  Vitals:   04/05/23 1230 04/05/23 1300  BP: 98/60 98/60  Pulse: 99 95  Resp: (!) 23 (!) 21  Temp:    SpO2: 94% 94%  Patient appears in no acute distress Respiratory: Normal respiratory effort  Review of Systems: Constitutional: Negative for fever and chills  Lab Results  Component Value Date   WBC 13.7 (H) 04/05/2023   HGB 7.6 (L) 04/05/2023   HCT 23.5 (L) 04/05/2023   MCV 100.0 04/05/2023   PLT 204 04/05/2023    Lab Results  Component Value Date   CREATININE 1.56 (H) 04/05/2023   BUN 27 (H) 04/05/2023   NA 133 (L) 04/05/2023   K 4.5 04/05/2023   CL 101 04/05/2023   CO2 26 04/05/2023    Lab Results  Component Value Date   ALT 23 03/30/2023   AST 24 03/30/2023   ALKPHOS 68 03/30/2023     Microbiology: Recent Results (from the past 240 hours)  Body fluid culture w Gram Stain     Status: None   Collection Time: 03/29/23  4:24 PM   Specimen: Pleura; Body Fluid  Result Value Ref Range Status   Specimen Description PLEURAL  Final   Special Requests RIGHT  Final   Gram Stain   Final    FEW WBC PRESENT, PREDOMINANTLY PMN RARE GRAM POSITIVE COCCI CRITICAL RESULT CALLED TO, READ BACK BY AND VERIFIED WITH: RN MANDY BURNETTE ON 03/29/23 @ 1958 BY DRT Performed at [facility], [address], [place], [place] [address]    Culture MODERATE STAPHYLOCOCCUS AUREUS  Final   Report Status 03/31/2023 FINAL  Final   Organism ID, Bacteria STAPHYLOCOCCUS AUREUS  Final      Susceptibility   Staphylococcus aureus - MIC*    CIPROFLOXACIN <=0.5 SENSITIVE Sensitive     ERYTHROMYCIN <=0.25 SENSITIVE Sensitive     GENTAMICIN <=0.5 SENSITIVE Sensitive     OXACILLIN 0.5 SENSITIVE Sensitive     TETRACYCLINE <=1 SENSITIVE Sensitive     VANCOMYCIN 1 SENSITIVE  Sensitive     TRIMETH/SULFA <=10 SENSITIVE Sensitive     CLINDAMYCIN <=0.25 SENSITIVE Sensitive     RIFAMPIN <=0.5 SENSITIVE Sensitive     Inducible Clindamycin NEGATIVE Sensitive     LINEZOLID 2 SENSITIVE Sensitive     * MODERATE STAPHYLOCOCCUS AUREUS  Culture, blood (Routine X 2) w Reflex to ID Panel     Status: None   Collection Time: 03/30/23  1:02 PM   Specimen: BLOOD LEFT HAND  Result Value Ref Range Status   Specimen Description BLOOD LEFT HAND  Final   Special Requests   Final    BOTTLES DRAWN AEROBIC AND ANAEROBIC Blood Culture results may not be optimal due to an inadequate volume of blood received in culture bottles   Culture   Final    NO GROWTH 5 DAYS Performed at [facility], [address], [place], [place] [address]    Report Status 04/04/2023 FINAL  Final  Culture, blood (Routine X 2) w Reflex to ID Panel     Status: None   Collection Time: 03/30/23  1:02 PM   Specimen: BLOOD RIGHT HAND  Result Value Ref Range Status   Specimen Description BLOOD RIGHT HAND  Final   Special Requests  Final    BOTTLES DRAWN AEROBIC AND ANAEROBIC Blood Culture results may not be optimal due to an inadequate volume of blood received in culture bottles   Culture   Final    NO GROWTH 5 DAYS Performed at [facility], [address] [place], [place] [address]    Report Status 04/04/2023 FINAL  Final  MRSA Next Gen by PCR, Nasal     Status: None   Collection Time: 03/30/23  6:45 PM   Specimen: Nasal Mucosa; Nasal Swab  Result Value Ref Range Status   MRSA by PCR Next Gen NOT DETECTED NOT DETECTED Final    Comment: (NOTE) The GeneXpert MRSA Assay (FDA approved for NASAL specimens only), is one component of a comprehensive MRSA colonization surveillance program. It is not intended to diagnose MRSA infection nor to guide or monitor treatment for MRSA infections. Test performance is not FDA approved in patients less than 85 years old. Performed at [facility],  [address] [place], [place] [address]     Impression/Plan:  Empyema.  He has MSSA in his culture from the pleural fluid.  He is on cefazolin.  He is tolerating this with no rash or diarrhea.  Will plan to continue for 3 weeks at discharge with cefadroxil. Acute kidney injury.  Now off of CRRT and has temporary HD catheter in place.  Being monitored for urine output and any dialysis needs. Leukocytosis.  Mild leukocytosis at 13.7 today but overall stable.  Will continue to monitor.

## 2023-04-05 NOTE — Progress Notes (Signed)
PHARMACY - ANTICOAGULATION CONSULT NOTE  Pharmacy Consult for IV heparin Indication: atrial fibrillation  Allergies  Allergen Reactions   Allopurinol     Possible DRESS syndrome   Colchicine     Possible DRESS syndrome    Patient Measurements: Height: 6\' 1"  (185.4 cm) Weight: 88.2 kg (194 lb 7.1 oz) IBW/kg (Calculated) : 79.9 Heparin Dosing Weight: 89 kg  Vital Signs: Temp: 98.2 F (36.8 C) (01/21 0800) Temp Source: Oral (01/21 0800) BP: 96/52 (01/21 0945) Pulse Rate: 89 (01/21 0945)  Labs: Recent Labs    04/03/23 0930 04/03/23 1607 04/03/23 2125 04/04/23 0729 04/04/23 1639 04/05/23 0308  HGB 8.2* 8.8*  --  7.8*  --  7.6*  HCT 25.6* 28.2*  --  24.0*  --  23.5*  PLT 147* 198  --  174  --  204  APTT 89*  --   --  >200*  --  123*  HEPARINUNFRC  --   --    < > 0.73* 0.56 0.45  CREATININE 1.44* 1.54*  --  1.50* 1.53* 1.56*   < > = values in this interval not displayed.    Estimated Creatinine Clearance: 42 mL/min (A) (by C-G formula based on SCr of 1.56 mg/dL (H)).   Medical History: Past Medical History:  Diagnosis Date   Acute renal failure (ARF) (HCC) 05/31/2019   B12 deficiency 08/02/2019   Bilateral carotid artery stenosis 05/31/2021   CAD (coronary artery disease) 2014   60-70% calcified LM lesion, high grade OM lesion, treated medically.   Chronic kidney disease, stage IV (severe) (HCC) 07/19/2019   Coronary artery disease involving native coronary artery of native heart without angina pectoris 07/19/2019   DM (diabetes mellitus) (HCC)    no longer on medications   Dyslipidemia 11/22/2019   Essential hypertension 07/19/2019   Gout    Gouty arthritis 09/06/2019   HTN (hypertension)    MSSA (methicillin susceptible Staphylococcus aureus) septicemia (HCC) 06/09/2012   Nonrheumatic aortic valve insufficiency 07/19/2019   OSA (obstructive sleep apnea) 04/19/2013   Dr Jerre Simon     PAF (paroxysmal atrial fibrillation) (HCC) 06/03/2019   during admission  for DRESS   Shock (HCC) 05/31/2019    Medications:  Infusions:    ceFAZolin (ANCEF) IV 2 g (04/05/23 1014)   feeding supplement (VITAL 1.5 CAL) 1,000 mL (04/05/23 0958)   heparin 1,000 Units/hr (04/05/23 0800)   sodium PHOSPHATE IVPB (in mmol)      Assessment: 83 yo male with new afib, previously on heparin this admission, then it was held for intrapleural thrombolytics.  Now pharmacy asked to resume.  No overt bleeding or complications noted, CBC fairly stable. CRRT heparin syringe turned off earlier 1/19 @ 1200 - order now d/c.   Heparin level 0.45 at goal on heparin drip rate 1000 uts/hr. Monitor closely   Goal of Therapy:  Heparin level 0.3-0.7 units/ml Monitor platelets by anticoagulation protocol: Yes   Plan:  Continue heparin 1000 units/hr.   Daily heparin level and CBC.   Leota Sauers Pharm.D. CPP, BCPS Clinical Pharmacist [phone] 04/05/2023 10:39 AM

## 2023-04-05 NOTE — Progress Notes (Signed)
Occupational Therapy Treatment Patient Details Name: Zachary Parrish MRN: [id number] DOB: 1940/08/13 Today's Date: 04/05/2023   History of present illness Zachary Parrish is an 83 year old gentleman admitted 1/14 presenting with greater than 2 months of progressive shortness of breath and leg edema. Over the last 2 weeks he has felt worse such that he is short of breath even at rest  He has been sleeping sitting upright sometimes in his car to prop him up enough. Pt also with afib. Pt with chest tube placed 1/14 due to pleural effusion. Pt  found to have AKI d/t ischemic ATN r/t septic shock with cultures growing staph aureus at chest tube 1/16. Pt transferred to ICU and CRRT initiated 1/16. PMH: A-fib many years ago, status post ablation and not recurrent so not on anticoagulation, CKD 3B   OT comments  Pt with good progression toward established OT goals this session. Pt with notably improved sequencing during mobility and ADL. Challenging activity tolerance, balance, and return to transfers this session. Needing up to mod A +2 for transfers. Current plan of care remains appropriate. Patient will benefit from continued inpatient follow up therapy, <3 hours/day       If plan is discharge home, recommend the following:  Two people to help with walking and/or transfers;Two people to help with bathing/dressing/bathroom;Assistance with cooking/housework;Assistance with feeding;Direct supervision/assist for medications management;Direct supervision/assist for financial management;Assist for transportation;Help with stairs or ramp for entrance;Supervision due to cognitive status   Equipment Recommendations  Other (comment) (defer to next level of care)    Recommendations for Other Services      Precautions / Restrictions Precautions Precautions: Fall;Other (comment) Precaution Comments: watch BP and O2 Restrictions Weight Bearing Restrictions Per Provider Order: No       Mobility Bed Mobility Overal  bed mobility: Needs Assistance Bed Mobility: Supine to Sit     Supine to sit: Mod assist     General bed mobility comments: verbal directional cues for sequence. pt needing assist to progress BLE and assist at trunk as well    Transfers Overall transfer level: Needs assistance Equipment used: Ambulation equipment used Transfers: Sit to/from Stand, Bed to chair/wheelchair/BSC Sit to Stand: Mod assist, +2 physical assistance, +2 safety/equipment, From elevated surface           General transfer comment: Mod A to rise up from EOB and then min A +2 to rise up from flaps of stedy with pt provided increased time to initiate.     Balance Overall balance assessment: Needs assistance Sitting-balance support: Single extremity supported, Bilateral upper extremity supported, Feet supported Sitting balance-Leahy Scale: Fair Sitting balance - Comments: sitting EOB with feet supported with CGA statically   Standing balance support: Bilateral upper extremity supported, During functional activity, Reliant on assistive device for balance Standing balance-Leahy Scale: Poor Standing balance comment: reliant on external support and ambulation equipment                           ADL either performed or assessed with clinical judgement   ADL Overall ADL's : Needs assistance/impaired     Grooming: Oral care;Set up Grooming Details (indicate cue type and reason): for oral care with suction toothbrush                 Toilet Transfer: Moderate assistance;+2 for safety/equipment;+2 for physical assistance Toilet Transfer Details (indicate cue type and reason): for STS 3x with stedy  General ADL Comments: decresaed activity tolerance. BP soft but stable. RN present and restarted levo    Extremity/Trunk Assessment Upper Extremity Assessment Upper Extremity Assessment: Right hand dominant;Generalized weakness;RUE deficits/detail;LUE deficits/detail RUE Deficits /  Details: generalized weakness; mild shoulder flexion deficits at baseline but WFL; decreased coordination RUE Coordination: decreased fine motor;decreased gross motor LUE Deficits / Details: generalized weakness; mild shoulder flexion deficits at baseline but WFL; decreased coordination LUE Coordination: decreased fine motor;decreased gross motor   Lower Extremity Assessment Lower Extremity Assessment: Defer to PT evaluation        Vision       Perception     Praxis      Cognition Arousal: Alert Behavior During Therapy: Flat affect Overall Cognitive Status: Impaired/Different from baseline Area of Impairment: Safety/judgement, Problem solving, Awareness                         Safety/Judgement: Decreased awareness of safety   Problem Solving: Slow processing, Requires verbal cues, Requires tactile cues General Comments: following commands and is oriented        Exercises      Shoulder Instructions       General Comments VSS    Pertinent Vitals/ Pain       Pain Assessment Pain Assessment: Faces Faces Pain Scale: Hurts little more Pain Location: bottom, R side Pain Descriptors / Indicators: Aching, Discomfort, Grimacing, Sore Pain Intervention(s): Limited activity within patient's tolerance, Monitored during session  Home Living                                          Prior Functioning/Environment              Frequency  Min 1X/week        Progress Toward Goals  OT Goals(current goals can now be found in the care plan section)  Progress towards OT goals: Progressing toward goals  Acute Rehab OT Goals Patient Stated Goal: pt did not state OT Goal Formulation: With patient Time For Goal Achievement: 04/15/23 Potential to Achieve Goals: Good ADL Goals Pt Will Perform Upper Body Bathing: with min assist;sitting Pt Will Perform Lower Body Bathing: with mod assist;sit to/from stand;sitting/lateral leans Pt Will Perform  Lower Body Dressing: with mod assist;sitting/lateral leans;sit to/from stand Pt Will Transfer to Toilet: with mod assist;bedside commode;ambulating (with LRAD) Pt Will Perform Toileting - Clothing Manipulation and hygiene: with mod assist;sit to/from stand;sitting/lateral leans  Plan      Co-evaluation                 AM-PAC OT "6 Clicks" Daily Activity     Outcome Measure   Help from another person eating meals?: A Little Help from another person taking care of personal grooming?: A Lot Help from another person toileting, which includes using toliet, bedpan, or urinal?: Total Help from another person bathing (including washing, rinsing, drying)?: A Lot Help from another person to put on and taking off regular upper body clothing?: A Lot Help from another person to put on and taking off regular lower body clothing?: Total 6 Click Score: 11    End of Session Equipment Utilized During Treatment: Oxygen  OT Visit Diagnosis: Other abnormalities of gait and mobility (R26.89);Muscle weakness (generalized) (M62.81);Ataxia, unspecified (R27.0);Other symptoms and signs involving cognitive function;Other (comment) (decreased activity)   Activity Tolerance Patient tolerated treatment well  Patient Left in chair;with call bell/phone within reach;with chair alarm set;with family/visitor present;with nursing/sitter in room   Nurse Communication Mobility status        Time: 4782-9562 OT Time Calculation (min): 25 min  Charges: OT General Charges $OT Visit: 1 Visit OT Treatments $Self Care/Home Management : 8-22 mins $Therapeutic Activity: 8-22 mins  Tyler Deis, OTR/L [facility] Acute Rehabilitation Office: [phone]   Myrla Halsted 04/05/2023, 11:27 AM

## 2023-04-06 ENCOUNTER — Inpatient Hospital Stay (HOSPITAL_COMMUNITY): Payer: Medicare Other

## 2023-04-06 DIAGNOSIS — I482 Chronic atrial fibrillation, unspecified: Secondary | ICD-10-CM | POA: Diagnosis not present

## 2023-04-06 DIAGNOSIS — J9601 Acute respiratory failure with hypoxia: Secondary | ICD-10-CM | POA: Diagnosis not present

## 2023-04-06 DIAGNOSIS — N186 End stage renal disease: Secondary | ICD-10-CM | POA: Diagnosis not present

## 2023-04-06 DIAGNOSIS — J869 Pyothorax without fistula: Secondary | ICD-10-CM | POA: Diagnosis not present

## 2023-04-06 LAB — COOXEMETRY PANEL
Carboxyhemoglobin: 2.2 % — ABNORMAL HIGH (ref 0.5–1.5)
Methemoglobin: <0.7 % (ref 0.0–1.5)
O2 Saturation: 63.3 %
Total hemoglobin: 7.6 g/dL — ABNORMAL LOW (ref 12.0–16.0)

## 2023-04-06 LAB — CBC
HCT: 23.4 % — ABNORMAL LOW (ref 39.0–52.0)
Hemoglobin: 7.6 g/dL — ABNORMAL LOW (ref 13.0–17.0)
MCH: 32.8 pg (ref 26.0–34.0)
MCHC: 32.5 g/dL (ref 30.0–36.0)
MCV: 100.9 fL — ABNORMAL HIGH (ref 80.0–100.0)
Platelets: 247 10*3/uL (ref 150–400)
RBC: 2.32 MIL/uL — ABNORMAL LOW (ref 4.22–5.81)
RDW: 17.6 % — ABNORMAL HIGH (ref 11.5–15.5)
WBC: 19.3 10*3/uL — ABNORMAL HIGH (ref 4.0–10.5)
nRBC: 1 % — ABNORMAL HIGH (ref 0.0–0.2)

## 2023-04-06 LAB — GLUCOSE, CAPILLARY
Glucose-Capillary: 175 mg/dL — ABNORMAL HIGH (ref 70–99)
Glucose-Capillary: 249 mg/dL — ABNORMAL HIGH (ref 70–99)
Glucose-Capillary: 178 mg/dL — ABNORMAL HIGH (ref 70–99)
Glucose-Capillary: 205 mg/dL — ABNORMAL HIGH (ref 70–99)

## 2023-04-06 LAB — MAGNESIUM: Magnesium: 2.4 mg/dL (ref 1.7–2.4)

## 2023-04-06 LAB — RENAL FUNCTION PANEL
Albumin: 2 g/dL — ABNORMAL LOW (ref 3.5–5.0)
Albumin: 2.1 g/dL — ABNORMAL LOW (ref 3.5–5.0)
Anion gap: 7 (ref 5–15)
Anion gap: 13 (ref 5–15)
BUN: 57 mg/dL — ABNORMAL HIGH (ref 8–23)
BUN: 78 mg/dL — ABNORMAL HIGH (ref 8–23)
CO2: 25 mmol/L (ref 22–32)
CO2: 21 mmol/L — ABNORMAL LOW (ref 22–32)
Calcium: 8 mg/dL — ABNORMAL LOW (ref 8.9–10.3)
Calcium: 8 mg/dL — ABNORMAL LOW (ref 8.9–10.3)
Chloride: 100 mmol/L (ref 98–111)
Chloride: 93 mmol/L — ABNORMAL LOW (ref 98–111)
Creatinine, Ser: 2.96 mg/dL — ABNORMAL HIGH (ref 0.61–1.24)
Creatinine, Ser: 3.74 mg/dL — ABNORMAL HIGH (ref 0.61–1.24)
GFR, Estimated: 21 mL/min — ABNORMAL LOW (ref >60)
GFR, Estimated: 16 mL/min — ABNORMAL LOW (ref >60)
Glucose, Bld: 215 mg/dL — ABNORMAL HIGH (ref 70–99)
Glucose, Bld: 198 mg/dL — ABNORMAL HIGH (ref 70–99)
Phosphorus: 4.1 mg/dL (ref 2.5–4.6)
Phosphorus: 4.2 mg/dL (ref 2.5–4.6)
Potassium: 5.2 mmol/L — ABNORMAL HIGH (ref 3.5–5.1)
Potassium: 5.6 mmol/L — ABNORMAL HIGH (ref 3.5–5.1)
Sodium: 132 mmol/L — ABNORMAL LOW (ref 135–145)
Sodium: 127 mmol/L — ABNORMAL LOW (ref 135–145)

## 2023-04-06 LAB — BASIC METABOLIC PANEL
Anion gap: 8 (ref 5–15)
Anion gap: 11 (ref 5–15)
BUN: 57 mg/dL — ABNORMAL HIGH (ref 8–23)
BUN: 82 mg/dL — ABNORMAL HIGH (ref 8–23)
CO2: 26 mmol/L (ref 22–32)
CO2: 23 mmol/L (ref 22–32)
Calcium: 7.9 mg/dL — ABNORMAL LOW (ref 8.9–10.3)
Calcium: 8 mg/dL — ABNORMAL LOW (ref 8.9–10.3)
Chloride: 99 mmol/L (ref 98–111)
Chloride: 95 mmol/L — ABNORMAL LOW (ref 98–111)
Creatinine, Ser: 2.93 mg/dL — ABNORMAL HIGH (ref 0.61–1.24)
Creatinine, Ser: 4.16 mg/dL — ABNORMAL HIGH (ref 0.61–1.24)
GFR, Estimated: 21 mL/min — ABNORMAL LOW (ref >60)
GFR, Estimated: 14 mL/min — ABNORMAL LOW (ref >60)
Glucose, Bld: 214 mg/dL — ABNORMAL HIGH (ref 70–99)
Glucose, Bld: 233 mg/dL — ABNORMAL HIGH (ref 70–99)
Potassium: 5.1 mmol/L (ref 3.5–5.1)
Potassium: 5.2 mmol/L — ABNORMAL HIGH (ref 3.5–5.1)
Sodium: 133 mmol/L — ABNORMAL LOW (ref 135–145)
Sodium: 129 mmol/L — ABNORMAL LOW (ref 135–145)

## 2023-04-06 LAB — PHOSPHORUS: Phosphorus: 4.2 mg/dL (ref 2.5–4.6)

## 2023-04-06 LAB — PROCALCITONIN: Procalcitonin: 0.29 ng/mL

## 2023-04-06 LAB — APTT: aPTT: 91 s — ABNORMAL HIGH (ref 24–36)

## 2023-04-06 LAB — HEPARIN LEVEL (UNFRACTIONATED): Heparin Unfractionated: 0.33 [IU]/mL (ref 0.30–0.70)

## 2023-04-06 MED ORDER — ONDANSETRON HCL 4 MG/2ML IJ SOLN
4.0000 mg | Freq: Four times a day (QID) | INTRAMUSCULAR | Status: DC | PRN
  Administered 2023-04-06: 4 mg via INTRAVENOUS
  Filled 2023-04-06: qty 2

## 2023-04-06 MED ORDER — METOCLOPRAMIDE HCL 5 MG/ML IJ SOLN
10.0000 mg | Freq: Three times a day (TID) | INTRAMUSCULAR | Status: AC
  Administered 2023-04-06 – 2023-04-07 (×3): 10 mg via INTRAVENOUS
  Filled 2023-04-06 (×3): qty 2

## 2023-04-06 MED ORDER — SODIUM ZIRCONIUM CYCLOSILICATE 10 G PO PACK
10.0000 g | PACK | Freq: Once | ORAL | Status: AC
  Administered 2023-04-06: 10 g via ORAL
  Filled 2023-04-06: qty 1

## 2023-04-06 MED ORDER — SODIUM ZIRCONIUM CYCLOSILICATE 10 G PO PACK: 10.0000 g | PACK | Freq: Once | ORAL | Status: DC

## 2023-04-06 MED ORDER — INSULIN ASPART 100 UNIT/ML IV SOLN
5.0000 [IU] | Freq: Once | INTRAVENOUS | Status: AC
  Administered 2023-04-06: 5 [IU] via INTRAVENOUS

## 2023-04-06 MED ORDER — DEXTROSE 50 % IV SOLN
1.0000 | Freq: Once | INTRAVENOUS | Status: AC
  Administered 2023-04-06: 50 mL via INTRAVENOUS
  Filled 2023-04-06: qty 50

## 2023-04-06 MED ORDER — CEFAZOLIN SODIUM-DEXTROSE 1-4 GM/50ML-% IV SOLN
1.0000 g | INTRAVENOUS | Status: DC
  Administered 2023-04-06: 1 g via INTRAVENOUS
  Filled 2023-04-06: qty 50

## 2023-04-06 MED ORDER — SODIUM ZIRCONIUM CYCLOSILICATE 10 G PO PACK
10.0000 g | PACK | Freq: Once | ORAL | Status: AC
  Administered 2023-04-06: 10 g
  Filled 2023-04-06: qty 1

## 2023-04-06 NOTE — TOC Initial Note (Signed)
Transition of Care [facility]) - Initial/Assessment Note    Patient Details  Name: Zachary Parrish MRN: [id number] Date of Birth: 1940/10/01  Transition of Care [facility]) CM/SW Contact:    Delilah Shan, LCSWA Phone Number: 04/06/2023, 4:19 PM  Clinical Narrative:                  CSW received consult for possible SNF placement at time of discharge. CSW spoke with patient at bedside regarding PT recommendation of SNF placement at time of discharge.  Patient expressed understanding of PT recommendation and is agreeable to SNF placement at time of discharge. Patient gave CSW permission to fax out initial referral for possible snf placement near the [place] area. CSW informed patient that CSW will fax out for SNF closer to patient being medically ready for dc. CSW discussed insurance authorization process and will provide Medicare SNF ratings list with accepted SNF bed offers when available. No further questions reported at this time. CSW to continue to follow and assist with discharge planning needs.   Expected Discharge Plan: Skilled Nursing Facility Barriers to Discharge: Continued Medical Work up   Patient Goals and CMS Choice Patient states their goals for this hospitalization and ongoing recovery are:: SNF CMS Medicare.gov Compare Post Acute Care list provided to:: Patient Choice offered to / list presented to : Patient      Expected Discharge Plan and Services In-house Referral: Clinical Social Work Discharge Planning Services: CM Consult Post Acute Care Choice: Home Health Living arrangements for the past 2 months: Single Family Home                 DME Arranged: N/A DME Agency: NA       HH Arranged: NA          Prior Living Arrangements/Services Living arrangements for the past 2 months: Single Family Home Lives with:: Spouse Patient language and need for interpreter reviewed:: Yes Do you feel safe going back to the place where you live?: No   SNF  Need for Family  Participation in Patient Care: Yes (Comment) Care giver support system in place?: Yes (comment) Current home services: DME (w/chair, walker, quad cane, regular cane) Criminal Activity/Legal Involvement Pertinent to Current Situation/Hospitalization: No - Comment as needed  Activities of Daily Living   ADL Screening (condition at time of admission) Independently performs ADLs?: Yes (appropriate for developmental age) Is the patient deaf or have difficulty hearing?: No Does the patient have difficulty seeing, even when wearing glasses/contacts?: No Does the patient have difficulty concentrating, remembering, or making decisions?: No  Permission Sought/Granted Permission sought to share information with : Case Manager, Magazine features editor, Family Supports Permission granted to share information with : Yes, Verbal Permission Granted  Share Information with NAME: Erskine Squibb  Permission granted to share info w AGENCY: SNF  Permission granted to share info w Relationship: spouse  Permission granted to share info w Contact Information: [phone]  Emotional Assessment Appearance:: Appears stated age Attitude/Demeanor/Rapport: Gracious Affect (typically observed): Calm Orientation: : Oriented to Self, Oriented to Place, Oriented to  Time, Oriented to Situation Alcohol / Substance Use: Not Applicable Psych Involvement: No (comment)  Admission diagnosis:  Pleural effusion [J90] Acute respiratory failure with hypoxia (HCC) [J96.01] Atrial fibrillation with RVR (HCC) [I48.91] Acute hypoxic respiratory failure (HCC) [J96.01] Patient Active Problem List   Diagnosis Date Noted   Malnutrition of moderate degree 03/31/2023   Empyema of right pleural space (HCC) 03/30/2023   Staph aureus infection 03/30/2023   Need for  management of chest tube 03/30/2023   Atrial fibrillation with RVR (HCC) 03/30/2023   Oliguria 03/30/2023   Abnormality of gait due to impairment of balance 03/30/2023    Decreased mobility and endurance 03/30/2023   Acute diastolic heart failure (HCC) 03/29/2023   Thyroid lesion 03/29/2023   Pleural effusion 03/29/2023   Anemia of chronic disease 03/29/2023   Alcohol abuse 03/29/2023   CAP (community acquired pneumonia) 03/29/2023   Hypotension 03/29/2023   Acute kidney injury superimposed on stage 4 chronic kidney disease (HCC) 03/29/2023   Acute respiratory failure with hypoxia (HCC) 03/29/2023   Bilateral carotid artery stenosis 05/31/2021   Dyslipidemia 11/22/2019   Gouty arthritis 09/06/2019   B12 deficiency 08/02/2019   Coronary artery disease involving native coronary artery of native heart without angina pectoris 07/19/2019   Essential hypertension 07/19/2019   Chronic kidney disease, stage IV (severe) (HCC) 07/19/2019   Type 2 diabetes mellitus with complication, without long-term current use of insulin (HCC) 07/19/2019   Septic shock (HCC) 05/31/2019   Abnormal transaminases 05/31/2019   OSA (obstructive sleep apnea) 04/19/2013   MSSA (methicillin susceptible Staphylococcus aureus) septicemia (HCC) 06/09/2012   PCP:  Verl Bangs, MD Pharmacy:   [facility] [address] [place] [address] Phone: [phone] Fax: [phone]  [id number] - [place], [place] - [address] [place] [address] Phone: [phone] Fax: [phone]  [id number] - [place], [place] - [address] AT [facility] [address] [place] [address] Phone: [phone] Fax: [phone]     Social Drivers of Health (SDOH) Social History: SDOH Screenings   Food Insecurity: No Food Insecurity (03/30/2023)  Housing: Low Risk  (03/30/2023)  Transportation Needs: No Transportation Needs (03/30/2023)  Utilities: Not At Risk (03/30/2023)  Financial Resource Strain: Low Risk  (01/24/2023)   Received from [facility]  Physical  Activity: Insufficiently Active (01/24/2023)   Received from [facility]  Social Connections: Socially Integrated (03/30/2023)  Stress: No Stress Concern Present (01/24/2023)   Received from [facility]  Tobacco Use: High Risk (03/29/2023)   SDOH Interventions:     Readmission Risk Interventions     No data to display

## 2023-04-06 NOTE — Progress Notes (Signed)
PHARMACY - ANTICOAGULATION CONSULT NOTE  Pharmacy Consult for IV heparin Indication: atrial fibrillation  Allergies  Allergen Reactions   Allopurinol     Possible DRESS syndrome   Colchicine     Possible DRESS syndrome    Patient Measurements: Height: 6\' 1"  (185.4 cm) Weight: 79 kg (174 lb 2.6 oz) IBW/kg (Calculated) : 79.9 Heparin Dosing Weight: 89 kg  Vital Signs: Temp: 97.3 F (36.3 C) (01/22 1110) Temp Source: Axillary (01/22 1110) BP: 121/51 (01/22 1000) Pulse Rate: 93 (01/22 1000)  Labs: Recent Labs    04/04/23 0729 04/04/23 1639 04/05/23 0308 04/05/23 1558 04/06/23 0355 04/06/23 0356  HGB 7.8*  --  7.6*  --  7.6*  --   HCT 24.0*  --  23.5*  --  23.4*  --   PLT 174  --  204  --  247  --   APTT >200*  --  123*  --  91*  --   HEPARINUNFRC 0.73* 0.56 0.45  --  0.33  --   CREATININE 1.50* 1.53* 1.56* 2.13* 2.93* 2.96*    Estimated Creatinine Clearance: 21.9 mL/min (A) (by C-G formula based on SCr of 2.96 mg/dL (H)).   Medical History: Past Medical History:  Diagnosis Date   Acute renal failure (ARF) (HCC) 05/31/2019   B12 deficiency 08/02/2019   Bilateral carotid artery stenosis 05/31/2021   CAD (coronary artery disease) 2014   60-70% calcified LM lesion, high grade OM lesion, treated medically.   Chronic kidney disease, stage IV (severe) (HCC) 07/19/2019   Coronary artery disease involving native coronary artery of native heart without angina pectoris 07/19/2019   DM (diabetes mellitus) (HCC)    no longer on medications   Dyslipidemia 11/22/2019   Essential hypertension 07/19/2019   Gout    Gouty arthritis 09/06/2019   HTN (hypertension)    MSSA (methicillin susceptible Staphylococcus aureus) septicemia (HCC) 06/09/2012   Nonrheumatic aortic valve insufficiency 07/19/2019   OSA (obstructive sleep apnea) 04/19/2013   Dr Jerre Simon     PAF (paroxysmal atrial fibrillation) (HCC) 06/03/2019   during admission for DRESS   Shock (HCC) 05/31/2019     Medications:  Infusions:    ceFAZolin (ANCEF) IV     feeding supplement (VITAL 1.5 CAL) 20 mL/hr at 04/06/23 1000   heparin 1,000 Units/hr (04/06/23 1000)   norepinephrine (LEVOPHED) Adult infusion Stopped (04/06/23 4098)    Assessment: 83 yo male with new afib, previously on heparin this admission, then it was held for intrapleural thrombolytics. Pharmacy asked to resume.  No overt bleeding or complications noted, CBC fairly stable. CRRT heparin syringe turned off 1/19  Heparin level 0.33 at goal on heparin drip rate 1000 uts/hr. Monitor closely   Goal of Therapy:  Heparin level 0.3-0.7 units/ml Monitor platelets by anticoagulation protocol: Yes   Plan:  Continue heparin 1000 units/hr.   Daily heparin level and CBC.   Leota Sauers Pharm.D. CPP, BCPS Clinical Pharmacist [phone] 04/06/2023 11:36 AM

## 2023-04-06 NOTE — Progress Notes (Signed)
Physical Therapy Treatment Patient Details Name: Zachary Parrish MRN: [id number] DOB: 01/10/1941 Today's Date: 04/06/2023   History of Present Illness Mr. Gaddis is an 83 year old gentleman admitted 1/14 presenting with greater than 2 months of progressive shortness of breath and leg edema. Over the last 2 weeks he has felt worse such that he is short of breath even at rest  He has been sleeping sitting upright sometimes in his car to prop him up enough. Pt also with afib. Pt with chest tube placed 1/14 due to pleural effusion. Pt  found to have AKI d/t ischemic ATN r/t septic shock with cultures growing staph aureus at chest tube 1/16. Pt transferred to ICU and CRRT initiated 1/16. PMH: A-fib many years ago, status post ablation and not recurrent so not on anticoagulation, CKD 3B    PT Comments  Pt is slowly progressing towards goals. Pt reports that he is fatigued today and deferred out of bed activities. Pt was able to sit EOB for ~15 min and perform LAQ, DF/PF seated, hip abd/add. Pt attempted scooting EOB at Max A with poor hip clearance due to generalized weakness. Pt was able to perform bed mobility at Min to Mod A. Due to pt current functional status, home set up and available assistance at home recommending skilled physical therapy services < 3 hours/day in order to address strength, balance and functional mobility to decrease risk for falls, injury, immobility, skin break down and re-hospitalization.     If plan is discharge home, recommend the following: Assistance with cooking/housework;Assist for transportation;Help with stairs or ramp for entrance;A lot of help with walking and/or transfers     Equipment Recommendations  Other (comment) (defer to post acute.)       Precautions / Restrictions Precautions Precautions: Fall;Other (comment) Precaution Comments: watch BP and O2 Restrictions Weight Bearing Restrictions Per Provider Order: No     Mobility  Bed Mobility Overal bed mobility:  Needs Assistance Bed Mobility: Supine to Sit, Sit to Supine     Supine to sit: Min assist Sit to supine: Mod assist   General bed mobility comments: verbal cues for sequencing. Pt requires Min A trunk progression to mid line. Mod A for sitting to supine at LE with verbal cues for sequencing. Pt was able to scoot EOB at Max A    Transfers     General transfer comment: pt declined due to fatigue.       Balance Overall balance assessment: Needs assistance Sitting-balance support: Single extremity supported, Bilateral upper extremity supported, Feet supported Sitting balance-Leahy Scale: Good Sitting balance - Comments: pt able to sit EOB at SBA. No LOB.      Cognition Arousal: Alert Behavior During Therapy: WFL for tasks assessed/performed Overall Cognitive Status: Within Functional Limits for tasks assessed     Following Commands: Follows one step commands consistently     Problem Solving: Requires verbal cues General Comments: following commands and is oriented           General Comments General comments (skin integrity, edema, etc.): O2 sats remained above 88 with good pleth line. Intermittent poor pleth line with lower readings in the low 80's O2 saturaion on room air. Pt HR 113 sitting EOB.      Pertinent Vitals/Pain Pain Assessment Pain Assessment: Faces Faces Pain Scale: Hurts a little bit Facial Expression: Relaxed, neutral Body Movements: Absence of movements Muscle Tension: Relaxed Compliance with ventilator (intubated pts.): N/A Vocalization (extubated pts.): Talking in normal tone or no sound CPOT Total:  0 Pain Location: bottom Pain Descriptors / Indicators: Discomfort, Grimacing, Sore Pain Intervention(s): Limited activity within patient's tolerance, Monitored during session     PT Goals (current goals can now be found in the care plan section) Acute Rehab PT Goals Patient Stated Goal: to get stronger and be able to breathe PT Goal Formulation:  With patient Time For Goal Achievement: 04/13/23 Potential to Achieve Goals: Good Progress towards PT goals: Progressing toward goals    Frequency    Min 1X/week      PT Plan  Continue with current POC        AM-PAC PT "6 Clicks" Mobility   Outcome Measure  Help needed turning from your back to your side while in a flat bed without using bedrails?: A Little Help needed moving from lying on your back to sitting on the side of a flat bed without using bedrails?: A Little Help needed moving to and from a bed to a chair (including a wheelchair)?: A Lot Help needed standing up from a chair using your arms (e.g., wheelchair or bedside chair)?: A Lot Help needed to walk in hospital room?: Total Help needed climbing 3-5 steps with a railing? : Total 6 Click Score: 12    End of Session   Activity Tolerance: Patient limited by fatigue Patient left: with call bell/phone within reach;with family/visitor present;in bed Nurse Communication: Mobility status PT Visit Diagnosis: Unsteadiness on feet (R26.81);Muscle weakness (generalized) (M62.81);Pain     Time: 1416-1450 PT Time Calculation (min) (ACUTE ONLY): 34 min  Charges:    $Therapeutic Activity: 23-37 mins PT General Charges $$ ACUTE PT VISIT: 1 Visit                     Harrel Carina, DPT, CLT  Acute Rehabilitation Services Office: [phone] (Secure chat preferred)    Claudia Desanctis 04/06/2023, 4:11 PM

## 2023-04-06 NOTE — Plan of Care (Signed)
  Problem: Education: Goal: Knowledge of General Education information will improve Description: Including pain rating scale, medication(s)/side effects and non-pharmacologic comfort measures Outcome: Progressing   Problem: Health Behavior/Discharge Planning: Goal: Ability to manage health-related needs will improve Outcome: Progressing   Problem: Clinical Measurements: Goal: Ability to maintain clinical measurements within normal limits will improve Outcome: Progressing Goal: Will remain free from infection Outcome: Progressing Goal: Diagnostic test results will improve Outcome: Progressing Goal: Respiratory complications will improve Outcome: Progressing Goal: Cardiovascular complication will be avoided Outcome: Progressing   Problem: Activity: Goal: Risk for activity intolerance will decrease Outcome: Progressing   Problem: Nutrition: Goal: Adequate nutrition will be maintained Outcome: Progressing   Problem: Coping: Goal: Level of anxiety will decrease Outcome: Progressing   Problem: Elimination: Goal: Will not experience complications related to bowel motility Outcome: Progressing Goal: Will not experience complications related to urinary retention Outcome: Progressing   Problem: Pain Management: Goal: General experience of comfort will improve Outcome: Progressing   Problem: Safety: Goal: Ability to remain free from injury will improve Outcome: Progressing   Problem: Skin Integrity: Goal: Risk for impaired skin integrity will decrease Outcome: Progressing   Problem: Education: Goal: Ability to describe self-care measures that may prevent or decrease complications (Diabetes Survival Skills Education) will improve Outcome: Progressing Goal: Individualized Educational Video(s) Outcome: Progressing   Problem: Coping: Goal: Ability to adjust to condition or change in health will improve Outcome: Progressing   Problem: Fluid Volume: Goal: Ability to  maintain a balanced intake and output will improve Outcome: Progressing   Problem: Health Behavior/Discharge Planning: Goal: Ability to identify and utilize available resources and services will improve Outcome: Progressing Goal: Ability to manage health-related needs will improve Outcome: Progressing   Problem: Metabolic: Goal: Ability to maintain appropriate glucose levels will improve Outcome: Progressing   Problem: Nutritional: Goal: Maintenance of adequate nutrition will improve Outcome: Progressing Goal: Progress toward achieving an optimal weight will improve Outcome: Progressing   Problem: Skin Integrity: Goal: Risk for impaired skin integrity will decrease Outcome: Progressing   Problem: Tissue Perfusion: Goal: Adequacy of tissue perfusion will improve Outcome: Progressing   Problem: Education: Goal: Knowledge of disease and its progression will improve Outcome: Progressing Goal: Individualized Educational Video(s) Outcome: Progressing   Problem: Health Behavior/Discharge Planning: Goal: Ability to manage health-related needs will improve Outcome: Progressing   Problem: Nutritional: Goal: Ability to make healthy dietary choices will improve Outcome: Progressing   Problem: Clinical Measurements: Goal: Complications related to the disease process, condition or treatment will be avoided or minimized Outcome: Progressing

## 2023-04-06 NOTE — Progress Notes (Signed)
[facility] NEPHROLOGY PROGRESS NOTE  Assessment/ Plan: Pt is a 83 y.o. yo male  with PMH significant for hypertension, type II DM, CAD, A-fib, CKD (f/b Dr. Glenna Fellows) was admitted with shortness of breath, seen as a consultation for the evaluation and management of acute kidney injury.   # Acute kidney injury on CKD IV b/l cr around 2.5-2.9: AKI likely ischemic ATN due to septic shock.  Kidney ultrasound with no hydronephrosis.  Refractory to diuretics and remained hypotensive therefore moved to ICU on 1/15.  During this hospitalization -7 L.  Initiated CRRT (500/300/1500) on 1/15-1/21 after placement of temporary HD catheter by PCCM.    CVP was only 5 @ time CRRT off on 1/21; UOP /24hrs.  WBC increasing.  Will give a dose of Lokelma as well.  # Septic shock/staph bacteremia: Pressors per primary team.  Antibiotics per primary team  # Right pleural effusion status post right chest tube placement and drainage of fluid.  Plan per PCCM.   # Peripheral edema/fluid overload: Did not respond with IV diuretics and unable to use further diuresis because of persistent hypotension.  Started CRRT as discussed above.   # Acute hypoxic respiratory failure: Wean oxygen as able # Acute CHF: optimizing volume status w/ crrt # A-fib with RVR  Subjective: Resting today; poor appetite, +nausea. On Levo; CRRT stopped 1/21 AM  Objective Vital signs in last 24 hours: Vitals:   04/06/23 0615 04/06/23 0630 04/06/23 0645 04/06/23 0700  BP: (!) 120/51 (!) 71/39 (!) 107/54   Pulse: 95 92 96   Resp: 20 20 20    Temp:    (!) 96.9 F (36.1 C)  TempSrc:      SpO2: 93% 92% 93%   Weight:      Height:       Weight change: -9.2 kg  Intake/Output Summary (Last 24 hours) at 04/06/2023 0759 Last data filed at 04/05/2023 2200 Gross per 24 hour  Intake 1678.76 ml  Output 345 ml  Net 1333.76 ml       Labs: RENAL PANEL Recent Labs  Lab 04/02/23 1611 04/02/23 1613 04/03/23 0930  04/03/23 1607 04/04/23 0729 04/04/23 1639 04/05/23 0307 04/05/23 0308 04/05/23 1558 04/06/23 0355 04/06/23 0356  NA  --    < > 135   < > 134* 134*  --  133* 133* 133* 132*  K  --    < > 4.5   < > 4.5 4.4  --  4.5 4.7 5.1 5.2*  CL  --    < > 102   < > 100 99  --  101 102 99 100  CO2  --    < > 25   < > 27 26  --  26 22 26 25   GLUCOSE  --    < > 227*   < > 224* 235*  --  230* 177* 214* 215*  BUN  --    < > 22   < > 27* 27*  --  27* 39* 57* 57*  CREATININE  --    < > 1.44*   < > 1.50* 1.53*  --  1.56* 2.13* 2.93* 2.96*  CALCIUM  --    < > 7.5*   < > 7.6* 7.6*  --  7.5* 7.7* 7.9* 8.0*  MG 2.5*  --  2.3  --  2.3  --   --  2.3  --  2.4  --   PHOS 3.0   < > 1.6*   < >  1.4* 2.3* 2.0* 2.0* 4.5 4.2 4.1  ALBUMIN  --    < >  --    < > 1.9* 2.0* 1.9*  --  2.0*  --  2.0*   < > = values in this interval not displayed.    Liver Function Tests: Recent Labs  Lab 04/05/23 0307 04/05/23 1558 04/06/23 0356  ALBUMIN 1.9* 2.0* 2.0*   No results for input(s): "LIPASE", "AMYLASE" in the last 168 hours. No results for input(s): "AMMONIA" in the last 168 hours. CBC: Recent Labs    04/03/23 0930 04/03/23 1607 04/04/23 0729 04/05/23 0308 04/06/23 0355  HGB 8.2* 8.8* 7.8* 7.6* 7.6*  MCV 101.2* 102.2* 99.2 100.0 100.9*    Cardiac Enzymes: No results for input(s): "CKTOTAL", "CKMB", "CKMBINDEX", "TROPONINI" in the last 168 hours. CBG: Recent Labs  Lab 04/05/23 0631 04/05/23 1214 04/05/23 1655 04/05/23 2122 04/06/23 0642  GLUCAP 194* 240* 170* 179* 175*    Iron Studies: No results for input(s): "IRON", "TIBC", "TRANSFERRIN", "FERRITIN" in the last 72 hours. Studies/Results: DG Chest Port 1 View Result Date: 04/05/2023 CLINICAL DATA:  Evaluate for hydropneumothorax. EXAM: PORTABLE CHEST 1 VIEW COMPARISON:  Chest radiograph dated 04/04/2023. FINDINGS: Right IJ central venous line and feeding tube are in similar position. Interval removal of the right chest tube. No significant interval  change in the size of the right hydropneumothorax. Small left pleural effusion. Stable cardiomediastinal silhouette. No acute osseous pathology. IMPRESSION: Interval removal of the right chest tube. No significant interval change in the size of the right hydropneumothorax. Electronically Signed   By: Elgie Collard M.D.   On: 04/05/2023 09:42    Medications: Infusions:   ceFAZolin (ANCEF) IV     feeding supplement (VITAL 1.5 CAL) 20 mL/hr at 04/05/23 2200   heparin 1,000 Units/hr (04/05/23 2200)   norepinephrine (LEVOPHED) Adult infusion 3 mcg/min (04/05/23 2200)    Scheduled Medications:  amiodarone  400 mg Oral BID   aspirin  81 mg Oral Daily   atorvastatin  40 mg Oral Daily   Chlorhexidine Gluconate Cloth  6 each Topical Daily   feeding supplement  237 mL Oral TID BM   feeding supplement (PROSource TF20)  60 mL Per Tube TID   insulin aspart  0-5 Units Subcutaneous QHS   insulin aspart  0-9 Units Subcutaneous TID WC   lidocaine  1 patch Transdermal Q24H   midodrine  15 mg Oral TID WC   multivitamin  1 tablet Oral BID   nicotine  21 mg Transdermal Daily   mouth rinse  15 mL Mouth Rinse 4 times per day    have reviewed scheduled and prn medications.  Physical Exam: General:NAD, resting in bedside chair Heart:normal rate, no rub Lungs: Coarse BS Abdomen:soft, Non-tender, non-distended Extremities:no edema at the ankles bilaterally Dialysis Access: Right IJ temporary HD catheter placed by PCCM on 1/15.  Kamdyn Covel W 04/06/2023,7:59 AM  LOS: 8 days

## 2023-04-06 NOTE — Progress Notes (Signed)
NAME:  Zachary Parrish, MRN:  [id number], DOB:  Dec 07, 1940, LOS: 8 ADMISSION DATE:  03/29/2023, CONSULTATION DATE:  1/14 REFERRING MD:  Miquel Dunn, CHIEF COMPLAINT:  EFFUSION    History of Present Illness:  83 year old male who presents to the emergency room with chief complaint of approximately 2 and half months of progressive exertional dyspnea.  Initially noted shortness of breath back in early November primarily with activity, around Thanksgiving time reporting fairly significant increased work of breathing to the point that family noticed, of note about 1 month prior to that certain to notice some mild increase lower extremity edema.  By Christmas time his shortness of breath had gotten to the point of resting dyspnea, he was also transition to 4 pillow orthopnea and at times would actually go out and sleep in his car to be in the upright position.  He has had productive cough with clear mucus, no chest pain, no palpitations.  No sick exposures.  His legs have become more painful over the last week, and he presents to the emergency room primarily because he can only speak in 2-3 word phrases and was significantly weak and orthopneic.  In the ER Evaluation Was initiated this demonstrated the following: Serum creatinine 2.83 is not far from his baseline, glucose 155 white blood cell count 14 troponin I of 23, a chest x-ray was obtained this demonstrated a large right pleural effusion because of this a CT of chest was obtained to further evaluate.  CT of chest demonstrated a large right pleural effusion with pleural thickening and rind, some basilar consolidated atelectasis which appeared rounded in nature, and a very small left pleural effusion he also had a 2.5 cm cystic lesion on his right thyroid.  Because of the pleural effusion and dyspnea pulmonary was asked to evaluate.  Pertinent  Medical History  Atrial fibrillation not on anticoagulation but has had prior cardioversion, carotid stenosis, CKD stage IIIb,  hyperlipidemia, coronary artery disease, type 2 diabetes, hypertension   Significant Hospital Events: Including procedures, antibiotic start and stop dates in addition to other pertinent events   1/14 admitted with progressive subacute dyspnea and large chronic appearing pleural effusion with rind on the right.  Thoracostomy tube placed in the emergency room  Interim History / Subjective:  Feels nauseous/fatigued this am.  Objective   Blood pressure (!) 114/47, pulse 100, temperature (!) 96.9 F (36.1 C), resp. rate (!) 21, height 6\' 1"  (1.854 m), weight 79 kg, SpO2 92%. CVP:  [4 mmHg] 4 mmHg      Intake/Output Summary (Last 24 hours) at 04/06/2023 3086 Last data filed at 04/06/2023 0800 Gross per 24 hour  Intake 1835.15 ml  Output 225 ml  Net 1610.15 ml   Filed Weights   04/04/23 0500 04/05/23 0500 04/06/23 0500  Weight: 88.1 kg 88.2 kg 79 kg    Examination: No distress Lungs clear apices, diminished bases Globally weak Ext warm No edema Abd soft hypoactive BS  BUN/Cr up Oliguric WBC has jumped back up  Resolved Hospital Problem list     Assessment & Plan:    Acute hypoxic respiratory failure due to right-sided empyema Empyema MSSA- Chest tube removed 1/21 Rising WBC, fatigue RV failure, acute Lingering vasoplegia Chronic afib AKI on CKD4 probably now ESRD DM2 Back pain, chronic- MRI neg Anemia stable Thrombocytopenia resolved Poor PO- getting TF  - Line holiday with rising WBC - Wean levo for MAP 65 - Longer course of abx as outlined by Dr. Luciana Axe - Continue midodrine -  Restart reglan, encourage PO - PT/OT - Keep in ICU until stable off pressors - Continue PO amio, eventual transition from heparin to NoAC once we are sure needs no further procedures  Best Practice (right click and "Reselect all SmartList Selections" daily)  Diet/type: Regular consistency + TF DVT prophylaxis: heparin  gtt GI prophylaxis: N/A Lines: HD catheter, and yes and it is  still needed Foley:  No Code Status:  full code Last date of multidisciplinary goals of care discussion: 1/19: wife updated at bedside  34 min cc time Myrla Halsted MD PCCM [facility]  See Amion for personal pager PCCM on call pager [phone] until 7pm. Please call Elink 7p-7a. [phone]  04/06/2023 8:23 AM

## 2023-04-07 ENCOUNTER — Inpatient Hospital Stay (HOSPITAL_COMMUNITY): Payer: Medicare Other

## 2023-04-07 DIAGNOSIS — I482 Chronic atrial fibrillation, unspecified: Secondary | ICD-10-CM | POA: Diagnosis not present

## 2023-04-07 DIAGNOSIS — N186 End stage renal disease: Secondary | ICD-10-CM | POA: Diagnosis not present

## 2023-04-07 DIAGNOSIS — J869 Pyothorax without fistula: Secondary | ICD-10-CM | POA: Diagnosis not present

## 2023-04-07 DIAGNOSIS — E119 Type 2 diabetes mellitus without complications: Secondary | ICD-10-CM

## 2023-04-07 DIAGNOSIS — B9561 Methicillin susceptible Staphylococcus aureus infection as the cause of diseases classified elsewhere: Secondary | ICD-10-CM | POA: Diagnosis not present

## 2023-04-07 DIAGNOSIS — J9601 Acute respiratory failure with hypoxia: Secondary | ICD-10-CM | POA: Diagnosis not present

## 2023-04-07 LAB — RENAL FUNCTION PANEL
Albumin: 2 g/dL — ABNORMAL LOW (ref 3.5–5.0)
Albumin: 2 g/dL — ABNORMAL LOW (ref 3.5–5.0)
Anion gap: 10 (ref 5–15)
Anion gap: 13 (ref 5–15)
BUN: 88 mg/dL — ABNORMAL HIGH (ref 8–23)
BUN: 106 mg/dL — ABNORMAL HIGH (ref 8–23)
CO2: 23 mmol/L (ref 22–32)
CO2: 22 mmol/L (ref 22–32)
Calcium: 8 mg/dL — ABNORMAL LOW (ref 8.9–10.3)
Calcium: 8.2 mg/dL — ABNORMAL LOW (ref 8.9–10.3)
Chloride: 95 mmol/L — ABNORMAL LOW (ref 98–111)
Chloride: 91 mmol/L — ABNORMAL LOW (ref 98–111)
Creatinine, Ser: 4.3 mg/dL — ABNORMAL HIGH (ref 0.61–1.24)
Creatinine, Ser: 5.15 mg/dL — ABNORMAL HIGH (ref 0.61–1.24)
GFR, Estimated: 13 mL/min — ABNORMAL LOW (ref >60)
GFR, Estimated: 11 mL/min — ABNORMAL LOW (ref >60)
Glucose, Bld: 179 mg/dL — ABNORMAL HIGH (ref 70–99)
Glucose, Bld: 204 mg/dL — ABNORMAL HIGH (ref 70–99)
Phosphorus: 4.7 mg/dL — ABNORMAL HIGH (ref 2.5–4.6)
Phosphorus: 4.5 mg/dL (ref 2.5–4.6)
Potassium: 5.3 mmol/L — ABNORMAL HIGH (ref 3.5–5.1)
Potassium: 5.6 mmol/L — ABNORMAL HIGH (ref 3.5–5.1)
Sodium: 128 mmol/L — ABNORMAL LOW (ref 135–145)
Sodium: 126 mmol/L — ABNORMAL LOW (ref 135–145)

## 2023-04-07 LAB — CBC
HCT: 24 % — ABNORMAL LOW (ref 39.0–52.0)
Hemoglobin: 7.7 g/dL — ABNORMAL LOW (ref 13.0–17.0)
MCH: 32.6 pg (ref 26.0–34.0)
MCHC: 32.1 g/dL (ref 30.0–36.0)
MCV: 101.7 fL — ABNORMAL HIGH (ref 80.0–100.0)
Platelets: 281 10*3/uL (ref 150–400)
RBC: 2.36 MIL/uL — ABNORMAL LOW (ref 4.22–5.81)
RDW: 17.6 % — ABNORMAL HIGH (ref 11.5–15.5)
WBC: 24.4 10*3/uL — ABNORMAL HIGH (ref 4.0–10.5)
nRBC: 1.1 % — ABNORMAL HIGH (ref 0.0–0.2)

## 2023-04-07 LAB — COMPREHENSIVE METABOLIC PANEL
ALT: <5 U/L (ref 0–44)
AST: 43 U/L — ABNORMAL HIGH (ref 15–41)
Albumin: 1.9 g/dL — ABNORMAL LOW (ref 3.5–5.0)
Alkaline Phosphatase: 86 U/L (ref 38–126)
Anion gap: 12 (ref 5–15)
BUN: 115 mg/dL — ABNORMAL HIGH (ref 8–23)
CO2: 22 mmol/L (ref 22–32)
Calcium: 7.9 mg/dL — ABNORMAL LOW (ref 8.9–10.3)
Chloride: 89 mmol/L — ABNORMAL LOW (ref 98–111)
Creatinine, Ser: 5.65 mg/dL — ABNORMAL HIGH (ref 0.61–1.24)
GFR, Estimated: 9 mL/min — ABNORMAL LOW (ref >60)
Glucose, Bld: 190 mg/dL — ABNORMAL HIGH (ref 70–99)
Potassium: 5.8 mmol/L — ABNORMAL HIGH (ref 3.5–5.1)
Sodium: 123 mmol/L — ABNORMAL LOW (ref 135–145)
Total Bilirubin: 0.4 mg/dL (ref 0.0–1.2)
Total Protein: 6.1 g/dL — ABNORMAL LOW (ref 6.5–8.1)

## 2023-04-07 LAB — APTT: aPTT: 92 s — ABNORMAL HIGH (ref 24–36)

## 2023-04-07 LAB — HEPARIN LEVEL (UNFRACTIONATED): Heparin Unfractionated: 0.18 [IU]/mL — ABNORMAL LOW (ref 0.30–0.70)

## 2023-04-07 LAB — GLUCOSE, CAPILLARY
Glucose-Capillary: 209 mg/dL — ABNORMAL HIGH (ref 70–99)
Glucose-Capillary: 216 mg/dL — ABNORMAL HIGH (ref 70–99)
Glucose-Capillary: 203 mg/dL — ABNORMAL HIGH (ref 70–99)
Glucose-Capillary: 172 mg/dL — ABNORMAL HIGH (ref 70–99)

## 2023-04-07 LAB — MRSA NEXT GEN BY PCR, NASAL: MRSA by PCR Next Gen: NOT DETECTED

## 2023-04-07 LAB — EXPECTORATED SPUTUM ASSESSMENT W GRAM STAIN, RFLX TO RESP C

## 2023-04-07 LAB — MAGNESIUM: Magnesium: 2.5 mg/dL — ABNORMAL HIGH (ref 1.7–2.4)

## 2023-04-07 MED ORDER — VITAL 1.5 CAL PO LIQD
720.0000 mL | ORAL | Status: DC
Start: 2023-04-07 — End: 2023-04-11
  Administered 2023-04-07 – 2023-04-10 (×4): 720 mL
  Filled 2023-04-07: qty 948
  Filled 2023-04-07: qty 1000
  Filled 2023-04-07 (×4): qty 948

## 2023-04-07 MED ORDER — METOCLOPRAMIDE HCL 5 MG/ML IJ SOLN
10.0000 mg | Freq: Three times a day (TID) | INTRAMUSCULAR | Status: DC
  Administered 2023-04-07 – 2023-04-12 (×16): 10 mg via INTRAVENOUS
  Filled 2023-04-07 (×16): qty 2

## 2023-04-07 MED ORDER — SODIUM ZIRCONIUM CYCLOSILICATE 10 G PO PACK
10.0000 g | PACK | Freq: Two times a day (BID) | ORAL | Status: DC
  Administered 2023-04-07 – 2023-04-14 (×13): 10 g
  Filled 2023-04-07 (×14): qty 1

## 2023-04-07 MED ORDER — SODIUM CHLORIDE 0.9 % IV SOLN
1.0000 g | INTRAVENOUS | Status: DC
  Administered 2023-04-07: 1 g via INTRAVENOUS
  Filled 2023-04-07: qty 20

## 2023-04-07 MED ORDER — SODIUM CHLORIDE 0.9 % IV SOLN
3.0000 g | INTRAVENOUS | Status: AC
  Administered 2023-04-08: 3 g via INTRAVENOUS
  Filled 2023-04-07: qty 8

## 2023-04-07 NOTE — Inpatient Diabetes Management (Signed)
Inpatient Diabetes Program Recommendations  AACE/ADA: New Consensus Statement on Inpatient Glycemic Control (2015)  Target Ranges:  Prepandial:   less than 140 mg/dL      Peak postprandial:   less than 180 mg/dL (1-2 hours)      Critically ill patients:  140 - 180 mg/dL   Lab Results  Component Value Date   GLUCAP 216 (H) 04/07/2023   HGBA1C 6.6 (H) 03/29/2023    Review of Glycemic Control  Latest Reference Range & Units 04/06/23 11:15 04/06/23 16:23 04/06/23 21:57 04/07/23 06:37 04/07/23 12:01  Glucose-Capillary 70 - 99 mg/dL 865 (H) 784 (H) 696 (H) 209 (H) 216 (H)  (H): Data is abnormally high Diabetes history: Type 2 DM Outpatient Diabetes medications: none Current orders for Inpatient glycemic control: Novolog 0-9 units TID & HS Vital @ 50 ml/hr  Inpatient Diabetes Program Recommendations:    Consider changing to Novolog 0-6 units Q4H.  Secure chat sent to MD.   Thanks, Lujean Rave, MSN, RNC-OB Diabetes Coordinator [phone] (8a-5p)

## 2023-04-07 NOTE — Progress Notes (Signed)
Nutrition Follow-up  DOCUMENTATION CODES:   Non-severe (moderate) malnutrition in context of acute illness/injury  INTERVENTION:   Encourage po diet as tolerated  Continue Ensure Enlive po BID, each supplement provides 350 kcal and 20 grams of protein.  Tube Feeding via Cortrak: Change to Nocturnal TF-discussed plan with Dr. Katrinka Blazing Vital 1.5 at 60 ml/hr x 12 hours TF regimen provides 49 g of protein, 1080 kcals and 547 mL of free water  Continue Pro-Source TF 20 60 mL per tube to better meet protein needs; each packet provides 20 g of protein and 80 kcals  Continue Renal MVI  NUTRITION DIAGNOSIS:   Moderate Malnutrition related to acute illness as evidenced by moderate muscle depletion, mild fat depletion, energy intake < 75% for > 7 days.  Continues but being addressed via TF, supplements  GOAL:   Patient will meet greater than or equal to 90% of their needs  Progressing  MONITOR:   Supplement acceptance, PO intake  REASON FOR ASSESSMENT:   Consult Assessment of nutrition requirement/status  ASSESSMENT:   Pt presented to ED w/ SOB and found to have AKI d/t ischemic ATN r/t septic shock. PMH: HTN, T2DM, CAD, A-fib. Has large R pleural effusion and small L pleural effusion. Chst tube in place and culture grew staph aureus. CRRT also initiated.  1/15 CRRT initiated 1/17 Cortrak placed, TF initiated 1/21 CRRT discontinued, TF rate decreased to 20 ml/hr by Provider (not in order in [facility]) to see if appetite will improve 1/22 Episode of emesis after eating greasy sausage; abd xray with Cortrak tip in stomach, unremarkable bowel gas pattern 1/23 SLP eval, Dysphagia 3/Thins recommended  UOP remains minimal, noted likely re-initiation of CRRT in the next 24-48 hours. Mild hyperkalemia today, noted lokelma ordered BID  WBC trending up  Abd xray on 1/22: Cortrak in stomach, unremarkable bowel gas pattern. +BM yesterday Noted reglan started today. +emesis yesterday AM but  none since. Per wife and pt, emesis occurred after eating pork sausage which according to pt was very greasy and the does not think it "sat right" with his tummy. No nausea currently, no emesis today  Vital 1.5 at 20 ml/hr today despite current order at 50 ml/hr. Per report, MD decreased to trickle TF a few days ago to see if this would improve appetite.  Pt previously tolerating at goal rate.   Appetite remains poor; discussed with wife, who agrees that po intake has not improved with reduced TF rate. Discussed plan to change to nocturnal TF with pt and wife who agree with this plan. Pt ate part of a blueberry muffin this AM and drank 1/2 Ensure. Over the course of the day, pt at max is consuming 1 full Ensure. Recorded po intake of meals trays is 10-30% of meals  No pressure injuries or new skin issues  Labs: sodium 128 (L), potassium 5.3 (H), phosphorus 4.7 (H), BUN 88, Creatinine 4.30, CBGs 178-249 (Goal 140-180) Meds: reglan, rena-vite, lokelma    Diet Order:   Diet Order             DIET DYS 3 Fluid consistency: Thin  Diet effective now                   EDUCATION NEEDS:   Education needs have been addressed  Skin:  Skin Assessment: Reviewed RN Assessment  Last BM:  1/22  Height:   Ht Readings from Last 1 Encounters:  03/29/23 6\' 1"  (1.854 m)    Weight:  Wt Readings from Last 1 Encounters:  04/06/23 79 kg    Ideal Body Weight:  83.6 kg  BMI:  Body mass index is 22.98 kg/m.  Estimated Nutritional Needs:   Kcal:  2100-2300kcal  Protein:  130-140g  Fluid:  >2L/day   Romelle Starcher MS, RDN, LDN, CNSC Registered Dietitian 3 Clinical Nutrition RD Inpatient Contact Info in Amion

## 2023-04-07 NOTE — Progress Notes (Signed)
[facility] NEPHROLOGY PROGRESS NOTE  Assessment/ Plan: Pt is a 83 y.o. yo male  with PMH significant for hypertension, type II DM, CAD, A-fib, CKD (f/b Dr. Glenna Fellows) was admitted with shortness of breath, seen as a consultation for the evaluation and management of acute kidney injury.   # Acute kidney injury on CKD IV b/l cr around 2.5-2.9: AKI likely ischemic ATN due to septic shock.  Kidney ultrasound with no hydronephrosis.  Refractory to diuretics and remained hypotensive therefore moved to ICU on 1/15.  During this hospitalization -7 L -> neg 5.7L.  Initiated CRRT (500/300/1500) on 1/15-1/21 after placement of temporary HD catheter by PCCM.    CVP was only 5 @ time CRRT off on 1/21; UOP /24hrs.  WBC increasing and d/w CCM; plan is another 24hrs line holiday from HD. Likely will need to restart CRRT in next 24-48 hrs. He's not making much urine.  Lokelma BID already ordered  # Septic shock/staph bacteremia: Pressors per primary team.  Antibiotics per primary team  # Right pleural effusion status post right chest tube placement and drainage of fluid.  Plan per PCCM.   # Peripheral edema/fluid overload: Did not respond with IV diuretics and unable to use further diuresis because of persistent hypotension.  Started CRRT as discussed above.   # Acute hypoxic respiratory failure: Wean oxygen as able # Acute CHF: optimizing volume status w/ crrt # A-fib with RVR  Subjective: On side of bed.  Spouse bedside, poor appetite but denies nausea.  States dyspnea is no worse than yesterday. CRRT stopped 1/21 AM  Objective Vital signs in last 24 hours: Vitals:   04/07/23 0900 04/07/23 1000 04/07/23 1100 04/07/23 1203  BP: (!) 125/105 (!) 128/48 (!) 120/55   Pulse: 93 89 (!) 102   Resp: (!) 22 16 18    Temp:    98.2 F (36.8 C)  TempSrc:    Oral  SpO2: 98% 97% 92%   Weight:      Height:       Weight change:   Intake/Output Summary (Last 24 hours) at 04/07/2023  1205 Last data filed at 04/07/2023 1000 Gross per 24 hour  Intake 919.89 ml  Output 50 ml  Net 869.89 ml       Labs: RENAL PANEL Recent Labs  Lab 04/03/23 0930 04/03/23 1607 04/04/23 0729 04/04/23 1639 04/05/23 0307 04/05/23 0308 04/05/23 1558 04/06/23 0355 04/06/23 0356 04/06/23 1706 04/06/23 2159 04/07/23 0246  NA 135   < > 134*   < >  --  133* 133* 133* 132* 127* 129* 128*  K 4.5   < > 4.5   < >  --  4.5 4.7 5.1 5.2* 5.6* 5.2* 5.3*  CL 102   < > 100   < >  --  101 102 99 100 93* 95* 95*  CO2 25   < > 27   < >  --  26 22 26 25  21* 23 23  GLUCOSE 227*   < > 224*   < >  --  230* 177* 214* 215* 198* 233* 179*  BUN 22   < > 27*   < >  --  27* 39* 57* 57* 78* 82* 88*  CREATININE 1.44*   < > 1.50*   < >  --  1.56* 2.13* 2.93* 2.96* 3.74* 4.16* 4.30*  CALCIUM 7.5*   < > 7.6*   < >  --  7.5* 7.7* 7.9* 8.0* 8.0* 8.0* 8.0*  MG  2.3  --  2.3  --   --  2.3  --  2.4  --   --   --  2.5*  PHOS 1.6*   < > 1.4*   < > 2.0* 2.0* 4.5 4.2 4.1 4.2  --  4.7*  ALBUMIN  --    < > 1.9*   < > 1.9*  --  2.0*  --  2.0* 2.1*  --  2.0*   < > = values in this interval not displayed.    Liver Function Tests: Recent Labs  Lab 04/06/23 0356 04/06/23 1706 04/07/23 0246  ALBUMIN 2.0* 2.1* 2.0*   No results for input(s): "LIPASE", "AMYLASE" in the last 168 hours. No results for input(s): "AMMONIA" in the last 168 hours. CBC: Recent Labs    04/03/23 1607 04/04/23 0729 04/05/23 0308 04/06/23 0355 04/07/23 0246  HGB 8.8* 7.8* 7.6* 7.6* 7.7*  MCV 102.2* 99.2 100.0 100.9* 101.7*    Cardiac Enzymes: No results for input(s): "CKTOTAL", "CKMB", "CKMBINDEX", "TROPONINI" in the last 168 hours. CBG: Recent Labs  Lab 04/06/23 1115 04/06/23 1623 04/06/23 2157 04/07/23 0637 04/07/23 1201  GLUCAP 249* 178* 205* 209* 216*    Iron Studies: No results for input(s): "IRON", "TIBC", "TRANSFERRIN", "FERRITIN" in the last 72 hours. Studies/Results: DG Chest Port 1 View Result Date:  04/06/2023 CLINICAL DATA:  Shortness of breath EXAM: PORTABLE CHEST 1 VIEW COMPARISON:  04/05/2023 FINDINGS: Right IJ central line tip: SVC. Feeding tube extends into the stomach. Atherosclerotic calcification of the aortic arch. Mild to moderate enlargement of the cardiopericardial silhouette. Blunting of both costophrenic angles compatible with pleural effusions, right greater than left. Associated presumed passive atelectasis,, mildly increased on the right compared to the prior exam. The gas component of the right hydropneumothorax is slightly less apparent on today's exam although still present. IMPRESSION: 1. The gas component of the right hydropneumothorax is slightly less apparent on today's exam although still present. 2. Bilateral pleural effusions, right greater than left. Associated presumed passive atelectasis, mildly increased on the right compared to the prior exam. 3. Mild to moderate enlargement of the cardiopericardial silhouette. 4. Aortic Atherosclerosis (ICD10-I70.0). Electronically Signed   By: Gaylyn Rong M.D.   On: 04/06/2023 13:31   DG Abd 1 View Result Date: 04/06/2023 CLINICAL DATA:  Shortness of breath, feeding tube EXAM: ABDOMEN - 1 VIEW COMPARISON:  04/01/2023 FINDINGS: The feeding tube terminates in the stomach fundus region. Splenic artery calcification noted. Lumbar spondylosis. Unremarkable bowel gas pattern. Suspected basilar pleural effusions. IMPRESSION: 1. The feeding tube terminates in the stomach fundus region. 2. Suspected basilar pleural effusions. Electronically Signed   By: Gaylyn Rong M.D.   On: 04/06/2023 13:28    Medications: Infusions:  [START ON 04/08/2023] ampicillin-sulbactam (UNASYN) IV     feeding supplement (VITAL 1.5 CAL) 20 mL/hr at 04/07/23 1000   heparin 1,000 Units/hr (04/07/23 1000)   norepinephrine (LEVOPHED) Adult infusion Stopped (04/06/23 0951)    Scheduled Medications:  amiodarone  400 mg Oral BID   aspirin  81 mg Oral  Daily   atorvastatin  40 mg Oral Daily   Chlorhexidine Gluconate Cloth  6 each Topical Daily   feeding supplement  237 mL Oral TID BM   feeding supplement (PROSource TF20)  60 mL Per Tube TID   insulin aspart  0-5 Units Subcutaneous QHS   insulin aspart  0-9 Units Subcutaneous TID WC   lidocaine  1 patch Transdermal Q24H   metoCLOPramide (REGLAN) injection  10 mg Intravenous  Q8H   midodrine  15 mg Oral TID WC   multivitamin  1 tablet Oral BID   nicotine  21 mg Transdermal Daily   mouth rinse  15 mL Mouth Rinse 4 times per day   sodium zirconium cyclosilicate  10 g Per Tube BID    have reviewed scheduled and prn medications.  Physical Exam: General:NAD, resting in bedside chair Heart:normal rate, no rub Lungs: Coarse BS Abdomen:soft, Non-tender, non-distended Extremities:no edema at the ankles bilaterally Dialysis Access: Right IJ temporary no longer present.  Ethelene Hal 04/07/2023,12:05 PM  LOS: 9 days

## 2023-04-07 NOTE — Plan of Care (Signed)
  Problem: Clinical Measurements: Goal: Ability to maintain clinical measurements within normal limits will improve Outcome: Progressing   Problem: Nutritional: Goal: Maintenance of adequate nutrition will improve Outcome: Progressing Goal: Progress toward achieving an optimal weight will improve Outcome: Progressing   Problem: Skin Integrity: Goal: Risk for impaired skin integrity will decrease Outcome: Progressing   Problem: Tissue Perfusion: Goal: Adequacy of tissue perfusion will improve Outcome: Progressing

## 2023-04-07 NOTE — Progress Notes (Signed)
PHARMACY - ANTICOAGULATION CONSULT NOTE  Pharmacy Consult for IV heparin Indication: atrial fibrillation  Allergies  Allergen Reactions   Allopurinol     Possible DRESS syndrome   Colchicine     Possible DRESS syndrome    Patient Measurements: Height: 6\' 1"  (185.4 cm) Weight: 79 kg (174 lb 2.6 oz) IBW/kg (Calculated) : 79.9 Heparin Dosing Weight: 89 kg  Vital Signs: Temp: 98.2 F (36.8 C) (01/23 1203) Temp Source: Oral (01/23 1203) BP: 107/49 (01/23 1300) Pulse Rate: 115 (01/23 1300)  Labs: Recent Labs    04/05/23 0308 04/05/23 1558 04/06/23 0355 04/06/23 0356 04/06/23 1706 04/06/23 2159 04/07/23 0246  HGB 7.6*  --  7.6*  --   --   --  7.7*  HCT 23.5*  --  23.4*  --   --   --  24.0*  PLT 204  --  247  --   --   --  281  APTT 123*  --  91*  --   --   --  92*  HEPARINUNFRC 0.45  --  0.33  --   --   --  0.18*  CREATININE 1.56*   < > 2.93*   < > 3.74* 4.16* 4.30*   < > = values in this interval not displayed.    Estimated Creatinine Clearance: 15.1 mL/min (A) (by C-G formula based on SCr of 4.3 mg/dL (H)).   Medical History: Past Medical History:  Diagnosis Date   Acute renal failure (ARF) (HCC) 05/31/2019   B12 deficiency 08/02/2019   Bilateral carotid artery stenosis 05/31/2021   CAD (coronary artery disease) 2014   60-70% calcified LM lesion, high grade OM lesion, treated medically.   Chronic kidney disease, stage IV (severe) (HCC) 07/19/2019   Coronary artery disease involving native coronary artery of native heart without angina pectoris 07/19/2019   DM (diabetes mellitus) (HCC)    no longer on medications   Dyslipidemia 11/22/2019   Essential hypertension 07/19/2019   Gout    Gouty arthritis 09/06/2019   HTN (hypertension)    MSSA (methicillin susceptible Staphylococcus aureus) septicemia (HCC) 06/09/2012   Nonrheumatic aortic valve insufficiency 07/19/2019   OSA (obstructive sleep apnea) 04/19/2013   Dr Jerre Simon     PAF (paroxysmal atrial  fibrillation) (HCC) 06/03/2019   during admission for DRESS   Shock (HCC) 05/31/2019    Medications:  Infusions:   [START ON 04/08/2023] ampicillin-sulbactam (UNASYN) IV     heparin 1,000 Units/hr (04/07/23 1300)   norepinephrine (LEVOPHED) Adult infusion Stopped (04/06/23 7829)    Assessment: 83 yo male with new afib, previously on heparin this admission, then it was held for intrapleural thrombolytics. Pharmacy asked to resume.  No overt bleeding or complications noted, CBC fairly stable. CRRT heparin syringe turned off 1/19  Heparin level 0.18 <  goal on heparin drip rate 1000 uts/hr. No bleeding noted  Goal of Therapy:  Heparin level 0.3-0.7 units/ml Monitor platelets by anticoagulation protocol: Yes   Plan:  Increase heparin 1100 units/hr.   Daily heparin level and CBC.   Leota Sauers Pharm.D. CPP, BCPS Clinical Pharmacist [phone] 04/07/2023 1:55 PM

## 2023-04-07 NOTE — Progress Notes (Addendum)
NAME:  Zachary Parrish, MRN:  [id number], DOB:  [birth year], LOS: 9 ADMISSION DATE:  03/29/2023, CONSULTATION DATE:  1/14 REFERRING MD:  Miquel Dunn, CHIEF COMPLAINT:  EFFUSION    History of Present Illness:  83 year old male who presents to the emergency room with chief complaint of approximately 2 and half months of progressive exertional dyspnea.  Initially noted shortness of breath back in early November primarily with activity, around Thanksgiving time reporting fairly significant increased work of breathing to the point that family noticed, of note about 1 month prior to that certain to notice some mild increase lower extremity edema.  By Christmas time his shortness of breath had gotten to the point of resting dyspnea, he was also transition to 4 pillow orthopnea and at times would actually go out and sleep in his car to be in the upright position.  He has had productive cough with clear mucus, no chest pain, no palpitations.  No sick exposures.  His legs have become more painful over the last week, and he presents to the emergency room primarily because he can only speak in 2-3 word phrases and was significantly weak and orthopneic.  In the ER Evaluation Was initiated this demonstrated the following: Serum creatinine 2.83 is not far from his baseline, glucose 155 white blood cell count 14 troponin I of 23, a chest x-ray was obtained this demonstrated a large right pleural effusion because of this a CT of chest was obtained to further evaluate.  CT of chest demonstrated a large right pleural effusion with pleural thickening and rind, some basilar consolidated atelectasis which appeared rounded in nature, and a very small left pleural effusion he also had a 2.5 cm cystic lesion on his right thyroid.  Because of the pleural effusion and dyspnea pulmonary was asked to evaluate.  Pertinent  Medical History  Atrial fibrillation not on anticoagulation but has had prior cardioversion, carotid stenosis, CKD stage IIIb,  hyperlipidemia, coronary artery disease, type 2 diabetes, hypertension   Significant Hospital Events: Including procedures, antibiotic start and stop dates in addition to other pertinent events   1/14 admitted with progressive subacute dyspnea and large chronic appearing pleural effusion with rind on the right.  Thoracostomy tube placed in the emergency room  Interim History / Subjective:  Less nauseous and weak this am. Minimal UOP  Objective   Blood pressure (!) 117/55, pulse (!) 105, temperature 98.4 F (36.9 C), temperature source Oral, resp. rate 19, height 6\' 1"  (1.854 m), weight 79 kg, SpO2 96%.        Intake/Output Summary (Last 24 hours) at 04/07/2023 0838 Last data filed at 04/07/2023 0800 Gross per 24 hour  Intake 983.1 ml  Output 50 ml  Net 933.1 ml   Filed Weights   04/04/23 0500 04/05/23 0500 04/06/23 0500  Weight: 88.1 kg 88.2 kg 79 kg    Examination: No distress Rhonci in lungs Ext warm Moves to command RASS 0 R chest tube site looks okay  WBC still going up  Patient Lines/Drains/Airways Status     Active Line/Drains/Airways     Name Placement date Placement time Site Days   Peripheral IV 03/30/23 22 G Anterior;Distal;Right Forearm 03/30/23  0134  Forearm  8   Peripheral IV 04/03/23 22 G Posterior;Right Forearm 04/03/23  2100  Forearm  4   Small Bore Feeding Tube 10 Fr. Left nare Marking at nare/corner of mouth 71 cm 04/01/23  1444  Left nare  6   Wound / Incision (Open or Dehisced)  04/06/23 Toe (Comment  which one) Anterior;Right 04/06/23  1200  Toe (Comment  which one)  1             Resolved Hospital Problem list     Assessment & Plan:    Acute hypoxic respiratory failure due to right-sided empyema Empyema MSSA- Chest tube removed 1/21 Rising WBC- HD line removed 1/22 RV failure, acute Lingering vasoplegia- improved with midodrine Chronic afib AKI on CKD4 probably now ESRD DM2 Back pain, chronic- MRI neg Anemia  stable Thrombocytopenia resolved Poor PO- getting TF  - d/w nephro: 1 more day line holiday, lokelma - check sputum culture, MRSA swab, start meropenem for a couple days (don't have great reason for this other than to get ahead of a developing HCAP, suspicion is he may be aspirating) - MBSS, SLP eval - Longer course of abx as outlined by Dr. Luciana Axe - Continue midodrine - Continue reglan, encourage PO - PT/OT - BP still borderline and may need CRRT as early as tomorrow, cannot send out of ICU at this time - Continue PO amio, eventual transition from heparin to NoAC once we are sure needs no further procedures - If WBC does not come down tomorrrow and MBSS unrevealing, consider pan-CT  Best Practice (right click and "Reselect all SmartList Selections" daily)  Diet/type: Regular consistency + TF DVT prophylaxis: heparin  gtt GI prophylaxis: N/A Lines: none Foley:  No Code Status:  full code Last date of multidisciplinary goals of care discussion: 1/23: wife updated at bedside  36 min cc time Myrla Halsted MD PCCM [facility]  See Amion for personal pager PCCM on call pager [phone] until 7pm. Please call Elink 7p-7a. [phone]  04/07/2023 8:38 AM

## 2023-04-07 NOTE — Evaluation (Addendum)
Modified Barium Swallow Study  Patient Details  Name: Zachary Parrish MRN: [id number] Date of Birth: 1940-04-28  Today's Date: 04/07/2023  Modified Barium Swallow completed.  Full report located under Chart Review in the Imaging Section.  History of Present Illness Zachary Parrish is an 83 year old gentleman admitted 1/14 presenting with greater than 2 months of progressive shortness of breath and leg edema. Over the last 2 weeks he has felt worse such that he is short of breath even at rest  He has been sleeping sitting upright sometimes in his car to prop him up enough. Pt also with afib. Pt with chest tube placed 1/14 due to pleural effusion. Pt  found to have AKI d/t ischemic ATN r/t septic shock with cultures growing staph aureus at chest tube 1/16. Pt transferred to ICU and CRRT initiated 1/16. PMH: A-fib many years ago, status post ablation and not recurrent so not on anticoagulation, CKD 3B   Clinical Impression Patient presents with a mild oropharyngeal dysphagia. Oral phase marked by mild oral transit delays and mild oral residuals, both likely due to increased WOB at baseline impacting coordination of oral phase. Swallow initiation largely [facility] with occassional initiation at the level of the vallecula. Primary deficit occuring at laryngeal vestibule with decreased closure of unknown definitive origin as hyolaryngeal movement appears WFL. This resulted in aspiration of thin liquid with subtle, ineffective throat clear in response. Cue for strong cough successful to clear and use of chin tuck in combination with small sips via cup or straw effective to prevent for the remainder of the study. Mild oral residuals cleared with spontaneous dry swallow. Esophageal sweep did reveal what appeared to be a moderate degree of post prandial residuals, not clearing with liquid wash. This could account for a decrease in supraglottic pressure and some decrease in laryngeal closure, as well as contribute to post prandial  coughing (reflux cough) noted by RN at bedside and confirmed by this SLP during todays study in which patient with strong cough post study wtihout observation of penetration or aspiration. Further, note that view somewhat obstructed by wide shoulders and decreased ability to hold himself strongly upright. Recommend diet modification and  SLP f/u for compensatory strategy training. With decreased WOB, swallowing function may improve. Factors that may increase risk of adverse event in presence of aspiration [name] 2021): Respiratory or GI disease;Poor general health and/or compromised immunity;Limited mobility  Swallow Evaluation Recommendations Recommendations: PO diet PO Diet Recommendation: Dysphagia 3 (Mechanical soft);Thin liquids (Level 0) Liquid Administration via: Cup;Straw Medication Administration: Whole meds with puree Supervision: Patient able to self-feed;Full assist for feeding Swallowing strategies  : Follow solids with liquids;Chin tuck Postural changes: Stay upright 30-60 min after meals;Position pt fully upright for meals Oral care recommendations: Oral care BID (2x/day) Recommended consults: Consider esophageal assessment     Aowyn Rozeboom MA, CCC-SLP  Shanekqua Schaper Meryl 04/07/2023,1:26 PM

## 2023-04-07 NOTE — Plan of Care (Signed)
  Problem: Clinical Measurements: Goal: Will remain free from infection Outcome: Not Progressing   

## 2023-04-07 NOTE — Progress Notes (Signed)
[facility]   Reason for visit: Follow-up on empyema  Interval History: No complaints today.  WBC of 24.4 which is up from previous.  He remains afebrile.-At bedside.  Physical Exam: Constitutional:  Vitals:   04/07/23 1000 04/07/23 1100  BP: (!) 128/48 (!) 120/55  Pulse: 89 (!) 102  Resp: 16 18  Temp:    SpO2: 97% 92%  Patient appears in no acute distress. Respiratory: Normal respiratory effort.  Review of Systems: Constitutional: No for fever or chills  Lab Results  Component Value Date   WBC 24.4 (H) 04/07/2023   HGB 7.7 (L) 04/07/2023   HCT 24.0 (L) 04/07/2023   MCV 101.7 (H) 04/07/2023   PLT 281 04/07/2023    Lab Results  Component Value Date   CREATININE 4.30 (H) 04/07/2023   BUN 88 (H) 04/07/2023   NA 128 (L) 04/07/2023   K 5.3 (H) 04/07/2023   CL 95 (L) 04/07/2023   CO2 23 04/07/2023    Lab Results  Component Value Date   ALT 23 03/30/2023   AST 24 03/30/2023   ALKPHOS 68 03/30/2023     Microbiology: Recent Results (from the past 240 hours)  Body fluid culture w Gram Stain     Status: None   Collection Time: 03/29/23  4:24 PM   Specimen: Pleura; Body Fluid  Result Value Ref Range Status   Specimen Description PLEURAL  Final   Special Requests RIGHT  Final   Gram Stain   Final    FEW WBC PRESENT, PREDOMINANTLY PMN RARE GRAM POSITIVE COCCI CRITICAL RESULT CALLED TO, READ BACK BY AND VERIFIED WITH: RN MANDY BURNETTE ON 03/29/23 @ 1958 BY DRT Performed at [facility], [address], [place], [place] [address]    Culture MODERATE STAPHYLOCOCCUS AUREUS  Final   Report Status 03/31/2023 FINAL  Final   Organism ID, Bacteria STAPHYLOCOCCUS AUREUS  Final      Susceptibility   Staphylococcus aureus - MIC*    CIPROFLOXACIN <=0.5 SENSITIVE Sensitive     ERYTHROMYCIN <=0.25 SENSITIVE Sensitive     GENTAMICIN <=0.5 SENSITIVE Sensitive     OXACILLIN 0.5 SENSITIVE Sensitive     TETRACYCLINE <=1 SENSITIVE Sensitive      VANCOMYCIN 1 SENSITIVE Sensitive     TRIMETH/SULFA <=10 SENSITIVE Sensitive     CLINDAMYCIN <=0.25 SENSITIVE Sensitive     RIFAMPIN <=0.5 SENSITIVE Sensitive     Inducible Clindamycin NEGATIVE Sensitive     LINEZOLID 2 SENSITIVE Sensitive     * MODERATE STAPHYLOCOCCUS AUREUS  Culture, blood (Routine X 2) w Reflex to ID Panel     Status: None   Collection Time: 03/30/23  1:02 PM   Specimen: BLOOD LEFT HAND  Result Value Ref Range Status   Specimen Description BLOOD LEFT HAND  Final   Special Requests   Final    BOTTLES DRAWN AEROBIC AND ANAEROBIC Blood Culture results may not be optimal due to an inadequate volume of blood received in culture bottles   Culture   Final    NO GROWTH 5 DAYS Performed at [facility], [address], [place], [place] [address]    Report Status 04/04/2023 FINAL  Final  Culture, blood (Routine X 2) w Reflex to ID Panel     Status: None   Collection Time: 03/30/23  1:02 PM   Specimen: BLOOD RIGHT HAND  Result Value Ref Range Status   Specimen Description BLOOD RIGHT HAND  Final  Special Requests   Final    BOTTLES DRAWN AEROBIC AND ANAEROBIC Blood Culture results may not be optimal due to an inadequate volume of blood received in culture bottles   Culture   Final    NO GROWTH 5 DAYS Performed at [facility], [address], [place], [place] [address]    Report Status 04/04/2023 FINAL  Final  MRSA Next Gen by PCR, Nasal     Status: None   Collection Time: 03/30/23  6:45 PM   Specimen: Nasal Mucosa; Nasal Swab  Result Value Ref Range Status   MRSA by PCR Next Gen NOT DETECTED NOT DETECTED Final    Comment: (NOTE) The GeneXpert MRSA Assay (FDA approved for NASAL specimens only), is one component of a comprehensive MRSA colonization surveillance program. It is not intended to diagnose MRSA infection nor to guide or monitor treatment for MRSA infections. Test performance is not FDA approved in patients less than 53 years old. Performed at  [facility], [address], [place], [place] [address]     Impression/Plan:  Empyema.  Has been on antibiotics with cefazolin however with concern for aspiration will change that to ampicillin-sulbactam.  Will then stop the meropenem as well. Acute kidney injury.  He is now off CRRT.  Continuing to monitor for needs for intermittent hemodialysis nephrology. Leukocytosis.  White blood cell count now up to 24.4.  No new source noted though to get a swallow study today to see if there is some consideration of aspiration.  On ampicillin-sulbactam now as above and has been on meropenem.  If no obvious aspiration, I agree with pan CT to determine any new or drainable area. Will continue to monitor

## 2023-04-08 ENCOUNTER — Inpatient Hospital Stay (HOSPITAL_COMMUNITY): Payer: Medicare Other

## 2023-04-08 DIAGNOSIS — I4891 Unspecified atrial fibrillation: Secondary | ICD-10-CM

## 2023-04-08 DIAGNOSIS — N179 Acute kidney failure, unspecified: Secondary | ICD-10-CM | POA: Diagnosis not present

## 2023-04-08 DIAGNOSIS — J9601 Acute respiratory failure with hypoxia: Secondary | ICD-10-CM | POA: Diagnosis not present

## 2023-04-08 DIAGNOSIS — B9561 Methicillin susceptible Staphylococcus aureus infection as the cause of diseases classified elsewhere: Secondary | ICD-10-CM | POA: Diagnosis not present

## 2023-04-08 DIAGNOSIS — J869 Pyothorax without fistula: Secondary | ICD-10-CM | POA: Diagnosis not present

## 2023-04-08 LAB — RENAL FUNCTION PANEL
Albumin: 1.9 g/dL — ABNORMAL LOW (ref 3.5–5.0)
Albumin: 1.9 g/dL — ABNORMAL LOW (ref 3.5–5.0)
Albumin: 1.9 g/dL — ABNORMAL LOW (ref 3.5–5.0)
Anion gap: 15 (ref 5–15)
Anion gap: 14 (ref 5–15)
Anion gap: 10 (ref 5–15)
BUN: 120 mg/dL — ABNORMAL HIGH (ref 8–23)
BUN: 122 mg/dL — ABNORMAL HIGH (ref 8–23)
BUN: 70 mg/dL — ABNORMAL HIGH (ref 8–23)
CO2: 20 mmol/L — ABNORMAL LOW (ref 22–32)
CO2: 21 mmol/L — ABNORMAL LOW (ref 22–32)
CO2: 23 mmol/L (ref 22–32)
Calcium: 8.1 mg/dL — ABNORMAL LOW (ref 8.9–10.3)
Calcium: 7.8 mg/dL — ABNORMAL LOW (ref 8.9–10.3)
Calcium: 7.5 mg/dL — ABNORMAL LOW (ref 8.9–10.3)
Chloride: 89 mmol/L — ABNORMAL LOW (ref 98–111)
Chloride: 89 mmol/L — ABNORMAL LOW (ref 98–111)
Chloride: 92 mmol/L — ABNORMAL LOW (ref 98–111)
Creatinine, Ser: 5.66 mg/dL — ABNORMAL HIGH (ref 0.61–1.24)
Creatinine, Ser: 6.09 mg/dL — ABNORMAL HIGH (ref 0.61–1.24)
Creatinine, Ser: 3.91 mg/dL — ABNORMAL HIGH (ref 0.61–1.24)
GFR, Estimated: 9 mL/min — ABNORMAL LOW (ref >60)
GFR, Estimated: 9 mL/min — ABNORMAL LOW (ref >60)
GFR, Estimated: 15 mL/min — ABNORMAL LOW (ref >60)
Glucose, Bld: 192 mg/dL — ABNORMAL HIGH (ref 70–99)
Glucose, Bld: 184 mg/dL — ABNORMAL HIGH (ref 70–99)
Glucose, Bld: 240 mg/dL — ABNORMAL HIGH (ref 70–99)
Phosphorus: 5 mg/dL — ABNORMAL HIGH (ref 2.5–4.6)
Phosphorus: 5.4 mg/dL — ABNORMAL HIGH (ref 2.5–4.6)
Phosphorus: 4.5 mg/dL (ref 2.5–4.6)
Potassium: 5.9 mmol/L — ABNORMAL HIGH (ref 3.5–5.1)
Potassium: 5.6 mmol/L — ABNORMAL HIGH (ref 3.5–5.1)
Potassium: 4.9 mmol/L (ref 3.5–5.1)
Sodium: 124 mmol/L — ABNORMAL LOW (ref 135–145)
Sodium: 124 mmol/L — ABNORMAL LOW (ref 135–145)
Sodium: 125 mmol/L — ABNORMAL LOW (ref 135–145)

## 2023-04-08 LAB — GLUCOSE, CAPILLARY
Glucose-Capillary: 184 mg/dL — ABNORMAL HIGH (ref 70–99)
Glucose-Capillary: 197 mg/dL — ABNORMAL HIGH (ref 70–99)
Glucose-Capillary: 154 mg/dL — ABNORMAL HIGH (ref 70–99)

## 2023-04-08 LAB — CBC
HCT: 20.6 % — ABNORMAL LOW (ref 39.0–52.0)
HCT: 21.8 % — ABNORMAL LOW (ref 39.0–52.0)
Hemoglobin: 6.9 g/dL — CL (ref 13.0–17.0)
Hemoglobin: 7.4 g/dL — ABNORMAL LOW (ref 13.0–17.0)
MCH: 33.7 pg (ref 26.0–34.0)
MCH: 33.2 pg (ref 26.0–34.0)
MCHC: 33.5 g/dL (ref 30.0–36.0)
MCHC: 33.9 g/dL (ref 30.0–36.0)
MCV: 100.5 fL — ABNORMAL HIGH (ref 80.0–100.0)
MCV: 97.8 fL (ref 80.0–100.0)
Platelets: 281 10*3/uL (ref 150–400)
Platelets: 293 10*3/uL (ref 150–400)
RBC: 2.05 MIL/uL — ABNORMAL LOW (ref 4.22–5.81)
RBC: 2.23 MIL/uL — ABNORMAL LOW (ref 4.22–5.81)
RDW: 17.5 % — ABNORMAL HIGH (ref 11.5–15.5)
RDW: 17.5 % — ABNORMAL HIGH (ref 11.5–15.5)
WBC: 27.3 10*3/uL — ABNORMAL HIGH (ref 4.0–10.5)
WBC: 24.1 10*3/uL — ABNORMAL HIGH (ref 4.0–10.5)
nRBC: 0.5 % — ABNORMAL HIGH (ref 0.0–0.2)
nRBC: 0.4 % — ABNORMAL HIGH (ref 0.0–0.2)

## 2023-04-08 LAB — HEPATITIS B SURFACE ANTIGEN: Hepatitis B Surface Ag: NONREACTIVE

## 2023-04-08 LAB — MAGNESIUM: Magnesium: 2.6 mg/dL — ABNORMAL HIGH (ref 1.7–2.4)

## 2023-04-08 LAB — HEPARIN LEVEL (UNFRACTIONATED): Heparin Unfractionated: 0.29 [IU]/mL — ABNORMAL LOW (ref 0.30–0.70)

## 2023-04-08 LAB — URIC ACID: Uric Acid, Serum: 5 mg/dL (ref 3.7–8.6)

## 2023-04-08 LAB — PREPARE RBC (CROSSMATCH)

## 2023-04-08 MED ORDER — MIDAZOLAM HCL 2 MG/2ML IJ SOLN
2.0000 mg | Freq: Once | INTRAMUSCULAR | Status: DC
  Filled 2023-04-08: qty 2

## 2023-04-08 MED ORDER — HEPARIN SODIUM (PORCINE) 1000 UNIT/ML IJ SOLN
2800.0000 [IU] | INTRAMUSCULAR | Status: DC | PRN
  Administered 2023-04-13 – 2023-04-15 (×2): 2800 [IU] via INTRAVENOUS
  Filled 2023-04-08: qty 2.8
  Filled 2023-04-08: qty 3
  Filled 2023-04-08: qty 2.8
  Filled 2023-04-08 (×2): qty 3

## 2023-04-08 MED ORDER — MIDODRINE HCL 5 MG PO TABS: 10.0000 mg | ORAL_TABLET | Freq: Once | ORAL | Status: DC

## 2023-04-08 MED ORDER — HEPARIN SODIUM (PORCINE) 1000 UNIT/ML IJ SOLN
INTRAMUSCULAR | Status: AC
  Filled 2023-04-08: qty 3

## 2023-04-08 MED ORDER — CEFAZOLIN SODIUM-DEXTROSE 1-4 GM/50ML-% IV SOLN
1.0000 g | INTRAVENOUS | Status: DC
Start: 2023-04-09 — End: 2023-04-11
  Administered 2023-04-09 – 2023-04-10 (×2): 1 g via INTRAVENOUS
  Filled 2023-04-08 (×3): qty 50

## 2023-04-08 MED ORDER — CHLORHEXIDINE GLUCONATE CLOTH 2 % EX PADS: 6.0000 | MEDICATED_PAD | Freq: Every day | CUTANEOUS | Status: DC

## 2023-04-08 MED ORDER — SODIUM CHLORIDE 0.9% IV SOLUTION: Freq: Once | INTRAVENOUS | Status: AC

## 2023-04-08 MED ORDER — IOHEXOL 350 MG/ML SOLN
75.0000 mL | Freq: Once | INTRAVENOUS | Status: AC | PRN
  Administered 2023-04-08: 75 mL via INTRAVENOUS

## 2023-04-08 MED ORDER — CHLORHEXIDINE GLUCONATE CLOTH 2 % EX PADS
6.0000 | MEDICATED_PAD | Freq: Every day | CUTANEOUS | Status: DC
  Administered 2023-04-11 – 2023-04-17 (×7): 6 via TOPICAL

## 2023-04-08 MED ORDER — THIAMINE MONONITRATE 100 MG PO TABS
100.0000 mg | ORAL_TABLET | Freq: Every day | ORAL | Status: DC
  Administered 2023-04-08 – 2023-04-16 (×9): 100 mg
  Filled 2023-04-08 (×9): qty 1

## 2023-04-08 MED ORDER — NOREPINEPHRINE 4 MG/250ML-% IV SOLN
INTRAVENOUS | Status: AC
  Filled 2023-04-08: qty 250

## 2023-04-08 MED ORDER — FOLIC ACID 1 MG PO TABS
1.0000 mg | ORAL_TABLET | Freq: Every day | ORAL | Status: DC
  Administered 2023-04-08 – 2023-04-16 (×9): 1 mg
  Filled 2023-04-08 (×9): qty 1

## 2023-04-08 MED ORDER — PREDNISONE 20 MG PO TABS
20.0000 mg | ORAL_TABLET | Freq: Every day | ORAL | Status: AC
  Administered 2023-04-08 – 2023-04-10 (×3): 20 mg
  Filled 2023-04-08 (×3): qty 1

## 2023-04-08 NOTE — Progress Notes (Signed)
Speech Language Pathology Treatment: Dysphagia  Patient Details Name: Zachary Parrish MRN: [id number] DOB: October 01, 1940 Today's Date: 04/08/2023 Time: 4098-1191 SLP Time Calculation (min) (ACUTE ONLY): 22 min  Assessment / Plan / Recommendation Clinical Impression  Patient seen for f/u diet tolerance assessment s/p MBS previous date. Spouse and RN present, both reporting increased lethargy today. Confirmed patient sleeping soundly upon arrival to room, able to arouse with moderate verbal and tactile cueing however only arousable for periods of 1-2 minutes before falling back to sleep,even with environmental distractions and verbal cueing. Agreeable to 3-4 sips of thin liquid via straw. Moderate cueing provided by SLP for use of chin tuck to decrease aspiration risk. Noted increased oral transit delays, oral residue and anterior labial spillage today which was not present during previous date. Suspect this is due to lethargy today. Education provided to spouse regarding results of MBS, diet recommendations and use of compensatory strategies as well as prognosis and plan. Patient is s/p esophagram this am, results not yet available. SLP will continue to f/u.    HPI HPI: Zachary Parrish is an 83 year old gentleman admitted 1/14 presenting with greater than 2 months of progressive shortness of breath and leg edema. Over the last 2 weeks he has felt worse such that he is short of breath even at rest  He has been sleeping sitting upright sometimes in his car to prop him up enough. Pt also with afib. Pt with chest tube placed 1/14 due to pleural effusion. Pt  found to have AKI d/t ischemic ATN r/t septic shock with cultures growing staph aureus at chest tube 1/16. Pt transferred to ICU and CRRT initiated 1/16. PMH: A-fib many years ago, status post ablation and not recurrent so not on anticoagulation, CKD 3B      SLP Plan  Continue with current plan of care      Recommendations for follow up therapy are one component  of a multi-disciplinary discharge planning process, led by the attending physician.  Recommendations may be updated based on patient status, additional functional criteria and insurance authorization.    Recommendations  Diet recommendations: Dysphagia 3 (mechanical soft);Thin liquid Liquids provided via: Cup;Straw Medication Administration: Whole meds with puree Supervision: Patient able to self feed;Full supervision/cueing for compensatory strategies Compensations: Slow rate;Small sips/bites;Follow solids with liquid;Chin tuck Postural Changes and/or Swallow Maneuvers: Seated upright 90 degrees;Upright 30-60 min after meal                  Oral care BID     Dysphagia, oropharyngeal phase (R13.12)     Continue with current plan of care    [facility] MA, CCC-SLP  Zachary Parrish  04/08/2023, 9:53 AM

## 2023-04-08 NOTE — Plan of Care (Signed)
  Problem: Education: Goal: Knowledge of General Education information will improve Description: Including pain rating scale, medication(s)/side effects and non-pharmacologic comfort measures Outcome: Progressing   Problem: Health Behavior/Discharge Planning: Goal: Ability to manage health-related needs will improve Outcome: Progressing   Problem: Clinical Measurements: Goal: Ability to maintain clinical measurements within normal limits will improve Outcome: Progressing Goal: Cardiovascular complication will be avoided Outcome: Progressing   Problem: Nutrition: Goal: Adequate nutrition will be maintained Outcome: Progressing   Problem: Coping: Goal: Level of anxiety will decrease Outcome: Progressing   Problem: Skin Integrity: Goal: Risk for impaired skin integrity will decrease Outcome: Progressing   Problem: Fluid Volume: Goal: Ability to maintain a balanced intake and output will improve Outcome: Progressing

## 2023-04-08 NOTE — Progress Notes (Signed)
eLink Physician-Brief Progress Note Patient Name: Zachary Parrish DOB: [birth year] MRN: [id number]   Date of Service  04/08/2023  HPI/Events of Note  Hemoglobin 6.9 gm / dl, no evidence of active bleeding.  eICU Interventions  1 unit PRBC ordered transfused,        [name] 04/08/2023, 3:28 AM

## 2023-04-08 NOTE — Progress Notes (Signed)
Physical Therapy Treatment Patient Details Name: Zachary Parrish MRN: [id number] DOB: 1940-05-19 Today's Date: 04/08/2023   History of Present Illness Zachary Parrish is an 83 year old gentleman admitted 1/14 presenting with greater than 2 months of progressive shortness of breath and leg edema. Over the last 2 weeks he has felt worse such that he is short of breath even at rest  He has been sleeping sitting upright sometimes in his car to prop him up enough. Pt also with afib. Pt with chest tube placed 1/14 due to pleural effusion. Pt  found to have AKI d/t ischemic ATN r/t septic shock with cultures growing staph aureus at chest tube 1/16. Pt transferred to ICU and CRRT initiated 1/16. PMH: A-fib many years ago, status post ablation and not recurrent so not on anticoagulation, CKD 3B    PT Comments  Pt has regressed with mobility at this time due to medical status. Currently pt is Max A for supine to sitting and 2 person Max A for sitting to supine due to fatigue. Pt was able to sit EOB ~5 min at Max A to SBA with R lateral lean. Pt requires Total A to roll to the L side after sitting EOB for nursing care. Due to pt current functional status, home set up and available assistance at home recommending skilled physical therapy services < 3 hours/day in order to address strength, balance and functional mobility to decrease risk for falls, injury, immobility, skin break down and re-hospitalization.      If plan is discharge home, recommend the following: Assistance with cooking/housework;Assist for transportation;Help with stairs or ramp for entrance;Two people to help with walking and/or transfers   Can travel by private vehicle     No  Equipment Recommendations  Other (comment) (defer to post acute)       Precautions / Restrictions Precautions Precautions: Fall;Other (comment) Precaution Comments: watch BP and O2 Restrictions Weight Bearing Restrictions Per Provider Order: No     Mobility  Bed  Mobility Overal bed mobility: Needs Assistance Bed Mobility: Supine to Sit, Sit to Supine     Supine to sit: Max assist Sit to supine: Max assist, +2 for physical assistance   General bed mobility comments: Pt is demonstrating meed for Max A with multi modal cues for initiation for supine to sitting and Max A +2 assist for sitting to supine due to weakness and fatigue.    Transfers Overall transfer level: Needs assistance Equipment used: Rolling walker (2 wheels) Transfers: Sit to/from Stand Sit to Stand: Total assist           General transfer comment: attempted sit to stand from elevated EOB for nursing care with 2 person assist and pt was unable to assist through pushing up through bil LE with quads.    Ambulation/Gait   General Gait Details: unable to amb at this time due to weakness and fatigue.      Balance Overall balance assessment: Needs assistance Sitting-balance support: Single extremity supported, Bilateral upper extremity supported, Feet supported Sitting balance-Leahy Scale: Poor Sitting balance - Comments: Pt requires Max A to SBA for sitting EOB due to heavy R lean initially that improved with time sitting EOB.        Cognition Arousal: Lethargic Behavior During Therapy: WFL for tasks assessed/performed Overall Cognitive Status: Within Functional Limits for tasks assessed Area of Impairment: Safety/judgement, Problem solving, Awareness     Following Commands: Follows one step commands consistently Safety/Judgement: Decreased awareness of safety   Problem Solving: Requires  verbal cues             General Comments General comments (skin integrity, edema, etc.): O2 sats remained at 88 % on 2 L O2 via [place]. Spouse present throughout session.      Pertinent Vitals/Pain Pain Assessment Pain Assessment: No/denies pain     PT Goals (current goals can now be found in the care plan section) Acute Rehab PT Goals Patient Stated Goal: to get stronger  and be able to breathe PT Goal Formulation: With patient Time For Goal Achievement: 04/13/23 Potential to Achieve Goals: Fair Progress towards PT goals: Progressing toward goals    Frequency    Min 1X/week      PT Plan  Continue with current POC        AM-PAC PT "6 Clicks" Mobility   Outcome Measure  Help needed turning from your back to your side while in a flat bed without using bedrails?: Total Help needed moving from lying on your back to sitting on the side of a flat bed without using bedrails?: Total Help needed moving to and from a bed to a chair (including a wheelchair)?: Total Help needed standing up from a chair using your arms (e.g., wheelchair or bedside chair)?: Total Help needed to walk in hospital room?: Total Help needed climbing 3-5 steps with a railing? : Total 6 Click Score: 6    End of Session Equipment Utilized During Treatment: Gait belt;Oxygen Activity Tolerance: Patient limited by fatigue Patient left: with call bell/phone within reach;with family/visitor present;in bed;with nursing/sitter in room Nurse Communication: Mobility status PT Visit Diagnosis: Unsteadiness on feet (R26.81);Muscle weakness (generalized) (M62.81);Pain     Time: 1610-9604 PT Time Calculation (min) (ACUTE ONLY): 17 min  Charges:    $Therapeutic Activity: 8-22 mins PT General Charges $$ ACUTE PT VISIT: 1 Visit                     Harrel Carina, DPT, CLT  Acute Rehabilitation Services Office: [phone] (Secure chat preferred)    Claudia Desanctis 04/08/2023, 12:08 PM

## 2023-04-08 NOTE — Progress Notes (Addendum)
NAME:  Zachary Parrish, MRN:  [id number], DOB:  1940-03-26, LOS: 10 ADMISSION DATE:  03/29/2023, CONSULTATION DATE:  1/14 REFERRING MD:  Miquel Dunn, CHIEF COMPLAINT:  EFFUSION    History of Present Illness:  83 year old male who presents to the emergency room with chief complaint of approximately 2 and half months of progressive exertional dyspnea.  Initially noted shortness of breath back in early November primarily with activity, around Thanksgiving time reporting fairly significant increased work of breathing to the point that family noticed, of note about 1 month prior to that certain to notice some mild increase lower extremity edema.  By Christmas time his shortness of breath had gotten to the point of resting dyspnea, he was also transition to 4 pillow orthopnea and at times would actually go out and sleep in his car to be in the upright position.  He has had productive cough with clear mucus, no chest pain, no palpitations.  No sick exposures.  His legs have become more painful over the last week, and he presents to the emergency room primarily because he can only speak in 2-3 word phrases and was significantly weak and orthopneic.  In the ER Evaluation Was initiated this demonstrated the following: Serum creatinine 2.83 is not far from his baseline, glucose 155 white blood cell count 14 troponin I of 23, a chest x-ray was obtained this demonstrated a large right pleural effusion because of this a CT of chest was obtained to further evaluate.  CT of chest demonstrated a large right pleural effusion with pleural thickening and rind, some basilar consolidated atelectasis which appeared rounded in nature, and a very small left pleural effusion he also had a 2.5 cm cystic lesion on his right thyroid.  Because of the pleural effusion and dyspnea pulmonary was asked to evaluate.  Pertinent  Medical History  Atrial fibrillation not on anticoagulation but has had prior cardioversion, carotid stenosis, CKD stage IIIb,  hyperlipidemia, coronary artery disease, type 2 diabetes, hypertension   Significant Hospital Events: Including procedures, antibiotic start and stop dates in addition to other pertinent events   1/14 admitted with progressive subacute dyspnea and large chronic appearing pleural effusion with rind on the right.  Thoracostomy tube placed in the emergency room  Interim History / Subjective:  He is lethargic this this morning when his usual Underwent a barium swallow study today, results pending White count continued to rise, currently at 27  Objective   Blood pressure 120/62, pulse (!) 111, temperature 98.2 F (36.8 C), temperature source Oral, resp. rate 19, height 6\' 1"  (1.854 m), weight 79 kg, SpO2 96%.        Intake/Output Summary (Last 24 hours) at 04/08/2023 0908 Last data filed at 04/08/2023 0800 Gross per 24 hour  Intake 2036.27 ml  Output 0 ml  Net 2036.27 ml   Filed Weights   04/04/23 0500 04/05/23 0500 04/06/23 0500  Weight: 88.1 kg 88.2 kg 79 kg    Examination: General: Acute chronically ill-appearing elderly male, lying on the bed HEENT: [place]/AT, eyes anicteric.  moist mucus membranes Neuro: Eyes open, following commands, generalized weak Chest: Coarse breath sounds, no wheezes or rhonchi Heart: Regular rate and rhythm, no murmurs or gallops Abdomen: Soft, nontender, nondistended, bowel sounds present Skin: No rash Extremities: Swollen and MPT joints in bilateral hands and feet  Labs and images reviewed  Resolved Hospital Problem list   Severe sepsis with septic shock, not POA  Assessment & Plan:  Acute respiratory failure with hypoxia due to  right-sided empyema MSSA empyema, chest tube removed on 1/21 Lethargy with rising WBC count, unclear source Acute RV systolic heart failure Chronic A-fib AKI on CKD stage IV due to cardiorenal syndrome versus septic ATN, now requiring hemodialysis Thyroid lesion Anemia and thrombocytopenia critical illness Diabetes  type 2 Hypervolemic hyponatremia/hypocalcemia Chronic back pain with negative MRI for acute findings Probable acute gout  Continue to titrate nasal cannula oxygen with O2 sat goal 92% Appreciate ID input, IV antibiotics switched to Unasyn White count continued to rise, currently at 27 Will get CT chest abdomen and pelvis to rule out abscess or collection HD catheter was taken out for line holiday Patient is scheduled for tunneled HD catheter placement today Continue midodrine Off vasopressor support Heart rate is controlled Continue amiodarone Continue IV heparin infusion for stroke prophylaxis Nephrology is following Patient will need outpatient follow-up for evaluation of thyroid lesion Hemoglobin dropped down to 6.9, he was transfused 1 unit PRBC Platelet counts have improved, thrombocytopenia has resolved Continue insulin CBG goal 140-180 Closely monitor electrolytes Patient is allergic to colchicine, after CT scan if negative for collection will try low-dose steroid to treat acute gout   Best Practice (right click and "Reselect all SmartList Selections" daily)  Diet/type: Regular consistency + TF DVT prophylaxis: heparin  gtt GI prophylaxis: N/A Lines: NA Foley:  No Code Status:  full code Last date of multidisciplinary goals of care discussion: 1/19: wife updated at bedside   The patient is critically ill due to MSSA empyema/AKI on CKD stage IV.  Critical care was necessary to treat or prevent imminent or life-threatening deterioration.  Critical care was time spent personally by me on the following activities: development of treatment plan with patient and/or surrogate as well as nursing, discussions with consultants, evaluation of patient's response to treatment, examination of patient, obtaining history from patient or surrogate, ordering and performing treatments and interventions, ordering and review of laboratory studies, ordering and review of radiographic studies,  pulse oximetry, re-evaluation of patient's condition and participation in multidisciplinary rounds.   During this encounter critical care time was devoted to patient care services described in this note for 35 minutes.     Cheri Fowler, MD [facility] See Amion for pager If no response to pager, please call [phone] until 7pm After 7pm, Please call E-link [phone]

## 2023-04-08 NOTE — Progress Notes (Signed)
   04/08/23 1858  Vitals  Temp 98.6 F (37 C)  Temp Source Oral  BP 104/69  MAP (mmHg) 81  BP Location Left Arm  BP Method Automatic  Patient Position (if appropriate) Lying  Pulse Rate (!) 104  Pulse Rate Source Monitor  ECG Heart Rate (!) 105  Resp 19  Oxygen Therapy  SpO2 95 %  O2 Device Nasal Cannula  O2 Flow Rate (L/min) 2 L/min  Post Treatment  Dialyzer Clearance Lightly streaked  Hemodialysis Intake (mL) 0 mL  Liters Processed 54  Fluid Removed (mL) 2000 mL  Tolerated HD Treatment Yes  Post-Hemodialysis Comments tx completed b/p remained stable throughout  Note  Patient Observations up in bed attempting to eat   Received patient in bed to unit.  Alert and oriented.  Informed consent signed and in chart.   TX duration:3 hours  Patient tolerated well.  Alert, without acute distress.  Hand-off given to patient's nurse.   Access used: Lt temp hd cath Access issues: none  Total UF removed: 2000 Medication(s) given: none Post HD VS: see above Post HD weight: n/a   [facility]

## 2023-04-08 NOTE — Progress Notes (Signed)
PHARMACY - ANTICOAGULATION CONSULT NOTE  Pharmacy Consult for IV heparin Indication: atrial fibrillation  Allergies  Allergen Reactions   Allopurinol     Possible DRESS syndrome   Colchicine     Possible DRESS syndrome    Patient Measurements: Height: 6\' 1"  (185.4 cm) Weight: 79 kg (174 lb 2.6 oz) IBW/kg (Calculated) : 79.9 Heparin Dosing Weight: 89 kg  Vital Signs: Temp: 98.1 F (36.7 C) (01/24 1131) Temp Source: Axillary (01/24 1131) BP: 123/43 (01/24 1215) Pulse Rate: 101 (01/24 1215)  Labs: Recent Labs    04/06/23 0355 04/06/23 0356 04/07/23 0246 04/07/23 1621 04/07/23 2201 04/08/23 0237  HGB 7.6*  --  7.7*  --   --  6.9*  HCT 23.4*  --  24.0*  --   --  20.6*  PLT 247  --  281  --   --  281  APTT 91*  --  92*  --   --   --   HEPARINUNFRC 0.33  --  0.18*  --   --  0.29*  CREATININE 2.93*   < > 4.30* 5.15* 5.65* 5.66*   < > = values in this interval not displayed.    Estimated Creatinine Clearance: 11.4 mL/min (A) (by C-G formula based on SCr of 5.66 mg/dL (H)).   Medical History: Past Medical History:  Diagnosis Date   Acute renal failure (ARF) (HCC) 05/31/2019   B12 deficiency 08/02/2019   Bilateral carotid artery stenosis 05/31/2021   CAD (coronary artery disease) 2014   60-70% calcified LM lesion, high grade OM lesion, treated medically.   Chronic kidney disease, stage IV (severe) (HCC) 07/19/2019   Coronary artery disease involving native coronary artery of native heart without angina pectoris 07/19/2019   DM (diabetes mellitus) (HCC)    no longer on medications   Dyslipidemia 11/22/2019   Essential hypertension 07/19/2019   Gout    Gouty arthritis 09/06/2019   HTN (hypertension)    MSSA (methicillin susceptible Staphylococcus aureus) septicemia (HCC) 06/09/2012   Nonrheumatic aortic valve insufficiency 07/19/2019   OSA (obstructive sleep apnea) 04/19/2013   Dr Jerre Simon     PAF (paroxysmal atrial fibrillation) (HCC) 06/03/2019   during  admission for DRESS   Shock (HCC) 05/31/2019    Medications:  Infusions:   [START ON 04/09/2023]  ceFAZolin (ANCEF) IV     heparin 1,100 Units/hr (04/08/23 1200)   norepinephrine (LEVOPHED) Adult infusion Stopped (04/06/23 0951)   norepinephrine      Assessment: 83 yo male with new afib, previously on heparin this admission, then it was held for intrapleural thrombolytics. Pharmacy asked to resume.  No overt bleeding or complications noted, CBC fairly stable. CRRT heparin syringe turned off 1/19  Heparin level 0.29 on heparin at 1100 units/hr, essentially within goal range.  No overt bleeding or complications noted.  CBC stable.  Goal of Therapy:  Heparin level 0.3-0.7 units/ml Monitor platelets by anticoagulation protocol: Yes   Plan:  Continue IV heparin at 1100 units/hr - suspect will accumulate a little into goal range.   Daily heparin level and CBC.   Reece Leader, Colon Flattery, BCCP Clinical Pharmacist  04/08/2023 2:46 PM   [facility] pharmacy phone numbers are listed on [email or web address]

## 2023-04-08 NOTE — Progress Notes (Signed)
[facility]   Reason for visit: Follow-up on empyema  Interval History: No acute events.  WBC of 27.3.  He remains afebrile.  Wife at the bedside.  Physical Exam: Constitutional:  Vitals:   04/08/23 1000 04/08/23 1015  BP: (!) 99/48 (!) 115/55  Pulse: (!) 106 (!) 107  Resp: 14 17  Temp:    SpO2: 97% 95%  Patient appears in no acute distress. Respiratory: Normal respiratory effort MS: multiple swelling of joints in hands and toes with erythema  Review of Systems: Constitutional: No fever or chills  Lab Results  Component Value Date   WBC 27.3 (H) 04/08/2023   HGB 6.9 (LL) 04/08/2023   HCT 20.6 (L) 04/08/2023   MCV 100.5 (H) 04/08/2023   PLT 281 04/08/2023    Lab Results  Component Value Date   CREATININE 5.66 (H) 04/08/2023   BUN 120 (H) 04/08/2023   NA 124 (L) 04/08/2023   K 5.9 (H) 04/08/2023   CL 89 (L) 04/08/2023   CO2 20 (L) 04/08/2023    Lab Results  Component Value Date   ALT <5 04/07/2023   AST 43 (H) 04/07/2023   ALKPHOS 86 04/07/2023     Microbiology: Recent Results (from the past 240 hours)  Body fluid culture w Gram Stain     Status: None   Collection Time: 03/29/23  4:24 PM   Specimen: Pleura; Body Fluid  Result Value Ref Range Status   Specimen Description PLEURAL  Final   Special Requests RIGHT  Final   Gram Stain   Final    FEW WBC PRESENT, PREDOMINANTLY PMN RARE GRAM POSITIVE COCCI CRITICAL RESULT CALLED TO, READ BACK BY AND VERIFIED WITH: RN MANDY BURNETTE ON 03/29/23 @ 1958 BY DRT Performed at [facility], [address], [place], [place] [address]    Culture MODERATE STAPHYLOCOCCUS AUREUS  Final   Report Status 03/31/2023 FINAL  Final   Organism ID, Bacteria STAPHYLOCOCCUS AUREUS  Final      Susceptibility   Staphylococcus aureus - MIC*    CIPROFLOXACIN <=0.5 SENSITIVE Sensitive     ERYTHROMYCIN <=0.25 SENSITIVE Sensitive     GENTAMICIN <=0.5 SENSITIVE Sensitive     OXACILLIN 0.5 SENSITIVE  Sensitive     TETRACYCLINE <=1 SENSITIVE Sensitive     VANCOMYCIN 1 SENSITIVE Sensitive     TRIMETH/SULFA <=10 SENSITIVE Sensitive     CLINDAMYCIN <=0.25 SENSITIVE Sensitive     RIFAMPIN <=0.5 SENSITIVE Sensitive     Inducible Clindamycin NEGATIVE Sensitive     LINEZOLID 2 SENSITIVE Sensitive     * MODERATE STAPHYLOCOCCUS AUREUS  Culture, blood (Routine X 2) w Reflex to ID Panel     Status: None   Collection Time: 03/30/23  1:02 PM   Specimen: BLOOD LEFT HAND  Result Value Ref Range Status   Specimen Description BLOOD LEFT HAND  Final   Special Requests   Final    BOTTLES DRAWN AEROBIC AND ANAEROBIC Blood Culture results may not be optimal due to an inadequate volume of blood received in culture bottles   Culture   Final    NO GROWTH 5 DAYS Performed at [facility], [address], [place], [place] [address]    Report Status 04/04/2023 FINAL  Final  Culture, blood (Routine X 2) w Reflex to ID Panel     Status: None   Collection Time: 03/30/23  1:02 PM   Specimen: BLOOD RIGHT HAND  Result Value Ref  Range Status   Specimen Description BLOOD RIGHT HAND  Final   Special Requests   Final    BOTTLES DRAWN AEROBIC AND ANAEROBIC Blood Culture results may not be optimal due to an inadequate volume of blood received in culture bottles   Culture   Final    NO GROWTH 5 DAYS Performed at [facility], [address], [place], [place] [address]    Report Status 04/04/2023 FINAL  Final  MRSA Next Gen by PCR, Nasal     Status: None   Collection Time: 03/30/23  6:45 PM   Specimen: Nasal Mucosa; Nasal Swab  Result Value Ref Range Status   MRSA by PCR Next Gen NOT DETECTED NOT DETECTED Final    Comment: (NOTE) The GeneXpert MRSA Assay (FDA approved for NASAL specimens only), is one component of a comprehensive MRSA colonization surveillance program. It is not intended to diagnose MRSA infection nor to guide or monitor treatment for MRSA infections. Test performance is not FDA  approved in patients less than 10 years old. Performed at [facility], [address], [place], [place] [address]   MRSA Next Gen by PCR, Nasal     Status: None   Collection Time: 04/07/23 10:24 AM   Specimen: Nasal Mucosa; Nasal Swab  Result Value Ref Range Status   MRSA by PCR Next Gen NOT DETECTED NOT DETECTED Final    Comment: (NOTE) The GeneXpert MRSA Assay (FDA approved for NASAL specimens only), is one component of a comprehensive MRSA colonization surveillance program. It is not intended to diagnose MRSA infection nor to guide or monitor treatment for MRSA infections. Test performance is not FDA approved in patients less than 28 years old. Performed at [facility], [address], [place], [place] [address]   Expectorated Sputum Assessment w Gram Stain, Rflx to Resp Cult     Status: None   Collection Time: 04/07/23 12:01 PM   Specimen: Sputum  Result Value Ref Range Status   Specimen Description SPU  Final   Special Requests NONE  Final   Sputum evaluation   Final    THIS SPECIMEN IS ACCEPTABLE FOR SPUTUM CULTURE Performed at [facility], [address], [place], [place] [address]    Report Status 04/07/2023 FINAL  Final  Culture, Respiratory w Gram Stain     Status: None (Preliminary result)   Collection Time: 04/07/23 12:01 PM   Specimen: Sputum  Result Value Ref Range Status   Specimen Description SPU  Final   Special Requests NONE Reflexed from [id number]  Final   Gram Stain   Final    ABUNDANT WBC PRESENT, PREDOMINANTLY PMN FEW YEAST WITH PSEUDOHYPHAE Performed at [facility], [address] [place], [place] [address]    Culture PENDING  Incomplete   Report Status PENDING  Incomplete    Impression/Plan:  Empyema.  MSSA in culture and now on ampicillin with sulbactam due to concern for aspiration with increasing leukocytosis.  Awaiting final swallow eval information.  Will change him back to cefazolin tomorrow. Acute renal failure.   Creatinine remains elevated and followed by nephrology.  Plan noted for consideration of hemodialysis. Leukocytosis.  This continues to rise.  Notable swelling of joints and hands and toes.  Concern for gout.  Agree with CT scan and if no new or deep infection noted, consider treatment for gout with steroids.  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 Dr Drue Second on over the  weekend if needed, otherwise ID team will follow up on Monday

## 2023-04-08 NOTE — Procedures (Signed)
Central Venous Catheter Insertion Procedure Note  Zachary Parrish  [id number]  November 15, 1940  Date:04/08/23  Time:2:07 PM   Provider Performing:Greysin Medlen Celine Mans   Procedure: Insertion of Non-tunneled Central Venous Catheter(36556)with US guidance (21308)    Indication(s) Hemodialysis  Consent Risks of the procedure as well as the alternatives and risks of each were explained to the patient and/or caregiver.  Consent for the procedure was obtained and is signed in the bedside chart  Anesthesia Topical only with 1% lidocaine   Timeout Verified patient identification, verified procedure, site/side was marked, verified correct patient position, special equipment/implants available, medications/allergies/relevant history reviewed, required imaging and test results available.  Sterile Technique Maximal sterile technique including full sterile barrier drape, hand hygiene, sterile gown, sterile gloves, mask, hair covering, sterile ultrasound probe cover (if used).  Procedure Description Area of catheter insertion was cleaned with chlorhexidine and draped in sterile fashion.   With real-time ultrasound guidance a HD catheter was placed into the left internal jugular vein.  Nonpulsatile blood flow and easy flushing noted in all ports.  The catheter was sutured in place and sterile dressing applied.  Complications/Tolerance None; patient tolerated the procedure well. Chest X-ray is ordered to verify placement for internal jugular or subclavian cannulation.  Chest x-ray is not ordered for femoral cannulation.  EBL Minimal  Specimen(s) None     Zachary Guys, PA - [facility] For pager details, please see AMION or use Epic chat  After 1900, please call ELINK for cross coverage needs 04/08/2023, 2:08 PM

## 2023-04-08 NOTE — Progress Notes (Addendum)
[facility] NEPHROLOGY PROGRESS NOTE  Assessment/ Plan: Pt is a 83 y.o. yo male  with PMH significant for hypertension, type II DM, CAD, A-fib, CKD (f/b Dr. Glenna Fellows) was admitted with shortness of breath, seen as a consultation for the evaluation and management of acute kidney injury.   # Acute kidney injury on CKD IV b/l cr around 2.5-2.9: AKI likely ischemic ATN due to septic shock.  Kidney ultrasound with no hydronephrosis.  Refractory to diuretics and remained hypotensive therefore moved to ICU on 1/15.  During this hospitalization -7 L -> neg 5.7L.  Initiated CRRT (500/300/1500) on 1/15-1/21 after placement of temporary HD catheter by PCCM.    CVP was only 5 @ time CRRT off on 1/21 AM; 0 mL UOP /24hrs.  WBC increasing and d/w CCM; plan is another 24hrs line holiday from HD. No UOP and with rising K despite Lokelma we will need to dialyze. Will give trial with iHD but likely will need to transition to CRRT.  # Septic shock/staph bacteremia: Pressors per primary team.  Antibiotics per primary team  # Right pleural effusion status post right chest tube placement and drainage of fluid.  Plan per PCCM.   # Peripheral edema/fluid overload: Did not respond with IV diuretics and unable to use further diuresis because of persistent hypotension.  Started CRRT as discussed above.   # Acute hypoxic respiratory failure: Wean oxygen as able # Acute CHF: optimizing volume status w/ crrt # A-fib with RVR  Subjective: In bed slight incr in RR. Spouse bedside, poor appetite but denies nausea.  CRRT stopped 1/21 AM  Objective Vital signs in last 24 hours: Vitals:   04/08/23 0930 04/08/23 0945 04/08/23 1000 04/08/23 1015  BP: 117/60 (!) 101/58 (!) 99/48 (!) 115/55  Pulse: (!) 105 89 (!) 106 (!) 107  Resp: 18 14 14 17   Temp:      TempSrc:      SpO2: 96% 96% 97% 95%  Weight:      Height:       Weight change:   Intake/Output Summary (Last 24 hours) at 04/08/2023 1119 Last data  filed at 04/08/2023 1000 Gross per 24 hour  Intake 2292.13 ml  Output 0 ml  Net 2292.13 ml       Labs: RENAL PANEL Recent Labs  Lab 04/04/23 0729 04/04/23 1639 04/05/23 0308 04/05/23 1558 04/06/23 0355 04/06/23 0356 04/06/23 1706 04/06/23 2159 04/07/23 0246 04/07/23 1621 04/07/23 2201 04/08/23 0237  NA 134*   < > 133*   < > 133* 132* 127* 129* 128* 126* 123* 124*  K 4.5   < > 4.5   < > 5.1 5.2* 5.6* 5.2* 5.3* 5.6* 5.8* 5.9*  CL 100   < > 101   < > 99 100 93* 95* 95* 91* 89* 89*  CO2 27   < > 26   < > 26 25 21* 23 23 22 22  20*  GLUCOSE 224*   < > 230*   < > 214* 215* 198* 233* 179* 204* 190* 192*  BUN 27*   < > 27*   < > 57* 57* 78* 82* 88* 106* 115* 120*  CREATININE 1.50*   < > 1.56*   < > 2.93* 2.96* 3.74* 4.16* 4.30* 5.15* 5.65* 5.66*  CALCIUM 7.6*   < > 7.5*   < > 7.9* 8.0* 8.0* 8.0* 8.0* 8.2* 7.9* 8.1*  MG 2.3  --  2.3  --  2.4  --   --   --  2.5*  --   --  2.6*  PHOS 1.4*   < > 2.0*   < > 4.2 4.1 4.2  --  4.7* 4.5  --  5.0*  ALBUMIN 1.9*   < >  --    < >  --  2.0* 2.1*  --  2.0* 2.0* 1.9* 1.9*   < > = values in this interval not displayed.    Liver Function Tests: Recent Labs  Lab 04/07/23 1621 04/07/23 2201 04/08/23 0237  AST  --  43*  --   ALT  --  <5  --   ALKPHOS  --  86  --   BILITOT  --  0.4  --   PROT  --  6.1*  --   ALBUMIN 2.0* 1.9* 1.9*   No results for input(s): "LIPASE", "AMYLASE" in the last 168 hours. No results for input(s): "AMMONIA" in the last 168 hours. CBC: Recent Labs    04/04/23 0729 04/05/23 0308 04/06/23 0355 04/07/23 0246 04/08/23 0237  HGB 7.8* 7.6* 7.6* 7.7* 6.9*  MCV 99.2 100.0 100.9* 101.7* 100.5*    Cardiac Enzymes: No results for input(s): "CKTOTAL", "CKMB", "CKMBINDEX", "TROPONINI" in the last 168 hours. CBG: Recent Labs  Lab 04/07/23 0637 04/07/23 1201 04/07/23 1524 04/07/23 2200 04/08/23 0632  GLUCAP 209* 216* 203* 172* 184*    Iron Studies: No results for input(s): "IRON", "TIBC", "TRANSFERRIN",  "FERRITIN" in the last 72 hours. Studies/Results: DG Chest Port 1 View Result Date: 04/08/2023 CLINICAL DATA:  Loculated empyema EXAM: PORTABLE CHEST 1 VIEW COMPARISON:  04/06/2023 FINDINGS: Rotated AP portable examination. Previously seen large-bore right neck multi lumen vascular catheter removed. Non weighted enteric feeding tube remains with tip in the gastric fundus. Unchanged moderate, loculated right pleural effusion and small, layering left pleural effusion. No new airspace opacity. Cardiomegaly. No acute osseous findings. IMPRESSION: 1. Unchanged moderate, loculated right pleural effusion and small, layering left pleural effusion. No new airspace opacity. 2. Previously seen large-bore right neck multi lumen vascular catheter removed. Non weighted enteric feeding tube remains with tip in the gastric fundus. Electronically Signed   By: Jearld Lesch M.D.   On: 04/08/2023 10:16   DG Swallowing Func-Speech Pathology Result Date: 04/07/2023 Table formatting from the original result was not included. Modified Barium Swallow Study Patient Details Name: Zachary Parrish MRN: [id number] Date of Birth: 13-Jul-1940 Today's Date: 04/07/2023 HPI/PMH: HPI: Zachary Parrish is an 83 year old gentleman admitted 1/14 presenting with greater than 2 months of progressive shortness of breath and leg edema. Over the last 2 weeks he has felt worse such that he is short of breath even at rest  He has been sleeping sitting upright sometimes in his car to prop him up enough. Pt also with afib. Pt with chest tube placed 1/14 due to pleural effusion. Pt  found to have AKI d/t ischemic ATN r/t septic shock with cultures growing staph aureus at chest tube 1/16. Pt transferred to ICU and CRRT initiated 1/16. PMH: A-fib many years ago, status post ablation and not recurrent so not on anticoagulation, CKD 3B Clinical Impression: Clinical Impression: Patient presents with a mild oropharyngeal dysphagia. Oral phase marked by mild oral transit delays  and mild oral residuals, both likely due to increased WOB at baseline impacting coordination of oral phase. Swallow initiation largely with occassional iniitation at the level of the vallecula. Primary deficit occuring at laryngeal vestibule with decreased closure of unknown definitive origin as hyolaryngeal movement appears WFL. This resulted in aspiration of thin  liquid with subtle, ineffective throat clear in response. Cue for strong cough successful to clear and use of chin tuck in combination with small sips via cup or straw effective to prevent for the remainder of the study. Mild oral residuals cleared with spontaneous dry swallow. Esophageal sweep did reveal what appeared to be a moderate degree of post prandial residuals, not clearing with liquid wash. This could account for a decrease in supraglottic pressure and some decrease in laryngeal closure, as well as contribute to post prandial coughing (reflux cough) noted by RN at bedside and confirmed by this SLP during todays study in which patient with strong cough post study wtihout observation of penetration or aspiration. Further, note that view somewhat obstructed by wide shoulders and decreased ability to hold himself strongly upright. Recommend diet modification and  SLP f/u for compensatory strategy training. With decreased WOB, swallowing function may improve. Factors that may increase risk of adverse event in presence of aspiration [name] & Clearance Coots 2021): Factors that may increase risk of adverse event in presence of aspiration [name] & Clearance Coots 2021): Respiratory or GI disease; Poor general health and/or compromised immunity; Limited mobility Recommendations/Plan: Swallowing Evaluation Recommendations Swallowing Evaluation Recommendations Recommendations: PO diet PO Diet Recommendation: Dysphagia 3 (Mechanical soft); Thin liquids (Level 0) Liquid Administration via: Cup; Straw Medication Administration: Whole meds with puree Supervision: Patient  able to self-feed; Full assist for feeding Swallowing strategies  : Follow solids with liquids; Chin tuck Postural changes: Stay upright 30-60 min after meals; Position pt fully upright for meals Oral care recommendations: Oral care BID (2x/day) Recommended consults: Consider esophageal assessment Treatment Plan Treatment Plan Treatment recommendations: Therapy as outlined in treatment plan below Follow-up recommendations: Other (comment) Recommendations Comment: TBD Functional status assessment: Patient has had a recent decline in their functional status and demonstrates the ability to make significant improvements in function in a reasonable and predictable amount of time. Treatment frequency: Min 2x/week Treatment duration: 2 weeks Interventions: Aspiration precaution training; Compensatory techniques; Patient/family education; Trials of upgraded texture/liquids; Diet toleration management by SLP Recommendations Recommendations for follow up therapy are one component of a multi-disciplinary discharge planning process, led by the attending physician.  Recommendations may be updated based on patient status, additional functional criteria and insurance authorization. Assessment: Orofacial Exam: Orofacial Exam Oral Cavity: Oral Hygiene: WFL Oral Cavity - Dentition: Dentures, top; Dentures, bottom Orofacial Anatomy: WFL Oral Motor/Sensory Function: WFL Anatomy: Anatomy: WFL Boluses Administered: Boluses Administered Boluses Administered: Thin liquids (Level 0); Mildly thick liquids (Level 2, nectar thick); Moderately thick liquids (Level 3, honey thick); Puree; Solid  Oral Impairment Domain: Oral Impairment Domain Lip Closure: Escape progressing to mid-chin Tongue control during bolus hold: Escape to lateral buccal cavity/floor of mouth Bolus preparation/mastication: Slow prolonged chewing/mashing with complete recollection Bolus transport/lingual motion: Delayed initiation of tongue motion (oral holding) Oral  residue: Residue collection on oral structures Location of oral residue : Tongue; Lateral sulci Initiation of pharyngeal swallow : Posterior angle of the ramus  Pharyngeal Impairment Domain: Pharyngeal Impairment Domain Soft palate elevation: No bolus between soft palate (SP)/pharyngeal wall (PW) Laryngeal elevation: Complete superior movement of thyroid cartilage with complete approximation of arytenoids to epiglottic petiole Anterior hyoid excursion: Partial anterior movement Epiglottic movement: Complete inversion Laryngeal vestibule closure: Incomplete, narrow column air/contrast in laryngeal vestibule Pharyngeal stripping wave : -- (difficult to view) Pharyngeal contraction (A/P view only): N/A Pharyngoesophageal segment opening: Complete distension and complete duration, no obstruction of flow Tongue base retraction: Trace column of contrast or air between tongue  base and PPW Pharyngeal residue: Complete pharyngeal clearance  Esophageal Impairment Domain: Esophageal Impairment Domain Esophageal clearance upright position: Esophageal retention Pill: Pill Consistency administered: Puree Puree: WFL Penetration/Aspiration Scale Score: Penetration/Aspiration Scale Score 1.  Material does not enter airway: Mildly thick liquids (Level 2, nectar thick); Moderately thick liquids (Level 3, honey thick); Puree; Solid; Pill 7.  Material enters airway, passes BELOW cords and not ejected out despite cough attempt by patient: Thin liquids (Level 0) Compensatory Strategies: Compensatory Strategies Compensatory strategies: Yes Chin tuck: Effective Effective Chin Tuck: Thin liquid (Level 0)   General Information: Caregiver present: No  Diet Prior to this Study: Regular; Thin liquids (Level 0)   Temperature : Normal   Respiratory Status: Increased WOB   Supplemental O2: Nasal cannula   History of Recent Intubation: No  Behavior/Cognition: Alert; Cooperative; Pleasant mood Self-Feeding Abilities: Able to self-feed Baseline vocal  quality/speech: Normal Volitional Cough: Able to elicit Volitional Swallow: Able to elicit Exam Limitations: Limited visibility; Poor positioning Goal Planning: Prognosis for improved oropharyngeal function: Fair No data recorded No data recorded No data recorded Consulted and agree with results and recommendations: Patient; Nurse Pain: Pain Assessment Pain Assessment: No/denies pain Faces Pain Scale: 2 Facial Expression: 0 Body Movements: 0 Muscle Tension: 0 Compliance with ventilator (intubated pts.): N/A Vocalization (extubated pts.): 0 CPOT Total: 0 Pain Location: bottom Pain Descriptors / Indicators: Discomfort; Grimacing; Sore Pain Intervention(s): Limited activity within patient's tolerance; Monitored during session End of Session: Start Time:SLP Start Time (ACUTE ONLY): 1230 Stop Time: SLP Stop Time (ACUTE ONLY): 1252 Time Calculation:SLP Time Calculation (min) (ACUTE ONLY): 22 min Charges: SLP Evaluations $ SLP Speech Visit: 1 Visit SLP Evaluations $MBS Swallow: 1 Procedure SLP visit diagnosis: SLP Visit Diagnosis: Dysphagia, unspecified (R13.10) Past Medical History: Past Medical History: Diagnosis Date  Acute renal failure (ARF) (HCC) 05/31/2019  B12 deficiency 08/02/2019  Bilateral carotid artery stenosis 05/31/2021  CAD (coronary artery disease) 2014  60-70% calcified LM lesion, high grade OM lesion, treated medically.  Chronic kidney disease, stage IV (severe) (HCC) 07/19/2019  Coronary artery disease involving native coronary artery of native heart without angina pectoris 07/19/2019  DM (diabetes mellitus) (HCC)   no longer on medications  Dyslipidemia 11/22/2019  Essential hypertension 07/19/2019  Gout   Gouty arthritis 09/06/2019  HTN (hypertension)   MSSA (methicillin susceptible Staphylococcus aureus) septicemia (HCC) 06/09/2012  Nonrheumatic aortic valve insufficiency 07/19/2019  OSA (obstructive sleep apnea) 04/19/2013  Dr Jerre Simon    PAF (paroxysmal atrial fibrillation) (HCC) 06/03/2019  during  admission for DRESS  Shock (HCC) 05/31/2019 Past Surgical History: Past Surgical History: Procedure Laterality Date  CARDIAC CATHETERIZATION  2014  CARDIOVERSION N/A 06/07/2019  Procedure: CARDIOVERSION;  Surgeon: Little Ishikawa, MD;  Location: [facility] ENDOSCOPY;  Service: Cardiovascular;  Laterality: N/A;  TEE WITHOUT CARDIOVERSION N/A 06/07/2019  Procedure: TRANSESOPHAGEAL ECHOCARDIOGRAM (TEE);  Surgeon: Little Ishikawa, MD;  Location: [facility] ENDOSCOPY;  Service: Cardiovascular;  Laterality: N/A; Ferdinand Lango MA, CCC-SLP McCoy Leah Meryl 04/07/2023, 1:28 PM   Medications: Infusions:  [START ON 04/09/2023]  ceFAZolin (ANCEF) IV     heparin 1,100 Units/hr (04/08/23 1000)   norepinephrine (LEVOPHED) Adult infusion Stopped (04/06/23 0951)   norepinephrine      Scheduled Medications:  amiodarone  400 mg Oral BID   aspirin  81 mg Oral Daily   atorvastatin  40 mg Oral Daily   Chlorhexidine Gluconate Cloth  6 each Topical Daily   feeding supplement  237 mL Oral TID BM   feeding supplement (PROSource  TF20)  60 mL Per Tube TID   feeding supplement (VITAL 1.5 CAL)  720 mL Per Tube Q24H   insulin aspart  0-5 Units Subcutaneous QHS   insulin aspart  0-9 Units Subcutaneous TID WC   lidocaine  1 patch Transdermal Q24H   metoCLOPramide (REGLAN) injection  10 mg Intravenous Q8H   midazolam  2 mg Intravenous Once   midodrine  15 mg Oral TID WC   multivitamin  1 tablet Oral BID   nicotine  21 mg Transdermal Daily   mouth rinse  15 mL Mouth Rinse 4 times per day   sodium zirconium cyclosilicate  10 g Per Tube BID    have reviewed scheduled and prn medications.  Physical Exam: General:NAD, resting in bed Heart:normal rate, no rub Lungs: Coarse BS Abdomen:soft, Non-tender, non-distended Extremities:no edema at the ankles bilaterally Dialysis Access: Right IJ temporary no longer present.  Mikaelyn Arthurs W 04/08/2023,11:19 AM  LOS: 10 days

## 2023-04-09 DIAGNOSIS — I959 Hypotension, unspecified: Secondary | ICD-10-CM

## 2023-04-09 DIAGNOSIS — E875 Hyperkalemia: Secondary | ICD-10-CM

## 2023-04-09 DIAGNOSIS — I5021 Acute systolic (congestive) heart failure: Secondary | ICD-10-CM

## 2023-04-09 DIAGNOSIS — J869 Pyothorax without fistula: Secondary | ICD-10-CM | POA: Diagnosis not present

## 2023-04-09 DIAGNOSIS — J9601 Acute respiratory failure with hypoxia: Secondary | ICD-10-CM | POA: Diagnosis not present

## 2023-04-09 DIAGNOSIS — J9819 Other pulmonary collapse: Secondary | ICD-10-CM | POA: Diagnosis not present

## 2023-04-09 DIAGNOSIS — R5381 Other malaise: Secondary | ICD-10-CM

## 2023-04-09 DIAGNOSIS — D696 Thrombocytopenia, unspecified: Secondary | ICD-10-CM

## 2023-04-09 LAB — RENAL FUNCTION PANEL
Albumin: 1.9 g/dL — ABNORMAL LOW (ref 3.5–5.0)
Albumin: 2 g/dL — ABNORMAL LOW (ref 3.5–5.0)
Anion gap: 12 (ref 5–15)
Anion gap: 12 (ref 5–15)
BUN: 87 mg/dL — ABNORMAL HIGH (ref 8–23)
BUN: 103 mg/dL — ABNORMAL HIGH (ref 8–23)
CO2: 25 mmol/L (ref 22–32)
CO2: 26 mmol/L (ref 22–32)
Calcium: 7.9 mg/dL — ABNORMAL LOW (ref 8.9–10.3)
Calcium: 8 mg/dL — ABNORMAL LOW (ref 8.9–10.3)
Chloride: 90 mmol/L — ABNORMAL LOW (ref 98–111)
Chloride: 88 mmol/L — ABNORMAL LOW (ref 98–111)
Creatinine, Ser: 4.8 mg/dL — ABNORMAL HIGH (ref 0.61–1.24)
Creatinine, Ser: 5.09 mg/dL — ABNORMAL HIGH (ref 0.61–1.24)
GFR, Estimated: 12 mL/min — ABNORMAL LOW (ref >60)
GFR, Estimated: 11 mL/min — ABNORMAL LOW (ref >60)
Glucose, Bld: 209 mg/dL — ABNORMAL HIGH (ref 70–99)
Glucose, Bld: 208 mg/dL — ABNORMAL HIGH (ref 70–99)
Phosphorus: 5.1 mg/dL — ABNORMAL HIGH (ref 2.5–4.6)
Phosphorus: 6.3 mg/dL — ABNORMAL HIGH (ref 2.5–4.6)
Potassium: 5.1 mmol/L (ref 3.5–5.1)
Potassium: 5.5 mmol/L — ABNORMAL HIGH (ref 3.5–5.1)
Sodium: 127 mmol/L — ABNORMAL LOW (ref 135–145)
Sodium: 126 mmol/L — ABNORMAL LOW (ref 135–145)

## 2023-04-09 LAB — CULTURE, RESPIRATORY W GRAM STAIN

## 2023-04-09 LAB — HEPATITIS B SURFACE ANTIBODY, QUANTITATIVE: Hep B S AB Quant (Post): <3.5 m[IU]/mL — ABNORMAL LOW

## 2023-04-09 LAB — BPAM RBC
Blood Product Expiration Date: 202502152359
ISSUE DATE / TIME: 202501240542
Unit Type and Rh: 5100

## 2023-04-09 LAB — TYPE AND SCREEN
ABO/RH(D): O POS
Antibody Screen: NEGATIVE
Unit division: 0

## 2023-04-09 LAB — GLUCOSE, CAPILLARY
Glucose-Capillary: 250 mg/dL — ABNORMAL HIGH (ref 70–99)
Glucose-Capillary: 179 mg/dL — ABNORMAL HIGH (ref 70–99)
Glucose-Capillary: 228 mg/dL — ABNORMAL HIGH (ref 70–99)
Glucose-Capillary: 242 mg/dL — ABNORMAL HIGH (ref 70–99)
Glucose-Capillary: 168 mg/dL — ABNORMAL HIGH (ref 70–99)

## 2023-04-09 LAB — HEPARIN LEVEL (UNFRACTIONATED)
Heparin Unfractionated: 0.24 [IU]/mL — ABNORMAL LOW (ref 0.30–0.70)
Heparin Unfractionated: 0.32 [IU]/mL (ref 0.30–0.70)

## 2023-04-09 LAB — MAGNESIUM: Magnesium: 2.2 mg/dL (ref 1.7–2.4)

## 2023-04-09 MED ORDER — INSULIN GLARGINE-YFGN 100 UNIT/ML ~~LOC~~ SOLN
5.0000 [IU] | Freq: Every day | SUBCUTANEOUS | Status: DC
  Administered 2023-04-09 – 2023-04-10 (×2): 5 [IU] via SUBCUTANEOUS
  Filled 2023-04-09 (×2): qty 0.05

## 2023-04-09 NOTE — Progress Notes (Signed)
NAME:  Cato Liburd, MRN:  [id number], DOB:  1940/08/09, LOS: 11 ADMISSION DATE:  03/29/2023, CONSULTATION DATE:  1/14 REFERRING MD:  pRAY, CHIEF COMPLAINT:  EFFUSION    History of Present Illness:  83 year old male who presents to the emergency room with chief complaint of approximately 2 and half months of progressive exertional dyspnea.  Initially noted shortness of breath back in early November primarily with activity, around Thanksgiving time reporting fairly significant increased work of breathing to the point that family noticed, of note about 1 month prior to that certain to notice some mild increase lower extremity edema.  By Christmas time his shortness of breath had gotten to the point of resting dyspnea, he was also transition to 4 pillow orthopnea and at times would actually go out and sleep in his car to be in the upright position.  He has had productive cough with clear mucus, no chest pain, no palpitations.  No sick exposures.  His legs have become more painful over the last week, and he presents to the emergency room primarily because he can only speak in 2-3 word phrases and was significantly weak and orthopneic.  In the ER Evaluation Was initiated this demonstrated the following: Serum creatinine 2.83 is not far from his baseline, glucose 155 white blood cell count 14 troponin I of 23, a chest x-ray was obtained this demonstrated a large right pleural effusion because of this a CT of chest was obtained to further evaluate.  CT of chest demonstrated a large right pleural effusion with pleural thickening and rind, some basilar consolidated atelectasis which appeared rounded in nature, and a very small left pleural effusion he also had a 2.5 cm cystic lesion on his right thyroid.  Because of the pleural effusion and dyspnea pulmonary was asked to evaluate.  Pertinent  Medical History  Atrial fibrillation not on anticoagulation but has had prior cardioversion, carotid stenosis, CKD stage IIIb,  hyperlipidemia, coronary artery disease, type 2 diabetes, hypertension   Significant Hospital Events: Including procedures, antibiotic start and stop dates in addition to other pertinent events   1/14 admitted with progressive subacute dyspnea and large chronic appearing pleural effusion with rind on the right.  Thoracostomy tube placed in the emergency room. 1/24 tolerated iHD  Interim History / Subjective:  Tolerated HD yesterday, remains off pressors. He denies complaints.  `  Objective   Blood pressure 108/70, pulse (!) 101, temperature 98.2 F (36.8 C), temperature source Oral, resp. rate 18, height 6\' 1"  (1.854 m), weight 94.2 kg, SpO2 98%.        Intake/Output Summary (Last 24 hours) at 04/09/2023 1449 Last data filed at 04/09/2023 1401 Gross per 24 hour  Intake 1430.9 ml  Output 2000 ml  Net -569.1 ml   Filed Weights   04/08/23 0705 04/08/23 1510 04/09/23 0500  Weight: 79 kg 79 kg 94.2 kg    Examination: General: chronically ill appearing man lying in bed in NAD HEENT: [place]/AT, eyes anicteric. Cortrak in place.  Neuro: awake, answering questions but not very talkative. Globally weak.  Chest: breathing comfortably on [place], rhonchi. Brown sputum in suction catheter.  Heart: S1S2, irreg rhtyhm Abdomen: soft, NT Skin: areas of ecchymoses Extremities: ++edema, no joint erythema  Na+  127 K+ 5.1 BUN 87 Cr 4.8 Phos 5.1 WBC 24.1 H/H 7.4/21.8 Platelets 293 Uric acid 5  Resolved Hospital Problem list   Severe sepsis with septic shock, not POA  Assessment & Plan:  Acute respiratory failure with hypoxia due to  right-sided empyema and pneumonia MSSA empyema, chest tube removed on 1/21 Trapped lung; will always have some degree of effusion present there. -con't antibiotics per ID- unasyn -recollect sputum culture; may need NTS or induced specimen -wean supplemental O2 as able to maintain SpO2 >90% -need to monitor effusions recurrence -Volume management with  dialysis  Lethargy with rising WBC count, unclear source- stable -con't antibiotics -check resp culture -appreciate ID's management; and had been to de-escalate back to cefazolin today. -Unfortunately normal uric acid does not support diagnosis of acute gout  Acute RV systolic heart failure -Volume management with dialysis  Chronic A-fib -Amiodarone - Heparin -Monitor on telemetry  AKI on CKD stage IV due to cardiorenal syndrome versus septic ATN, now requiring hemodialysis Hypervolemic hyponatremia hypocalcemia -Continue Lokelma - Renal replacement therapy per nephrology; hopefully can make it to Monday - Strict I/os -Renally dose meds and avoid nephrotoxins -Manage electrolytes with dialysis  Hypotension, concern for vasoplegia with underlying liver disease - Continue midodrine 15 mg 3 times daily  Thyroid cystic lesion -Eventually needs thyroid ultrasound, can be done as an outpatient  Anemia and thrombocytopenia due to critical illness -Transfuse for hemoglobin less than 7 or hemodynamically significant bleeding  Diabetes type 2, uncontrolled on prednisone -SSI PRN -adding semglee 5 units while on prednisone  Chronic back pain with negative MRI for acute findings -Pain control, mobility as able -Okay to continue Robaxin  Deconditioning -PT, OT  Moderate protein energy malnutrition -TF, supplemental vitamins  History of tobacco use - Continue nicotine replacement therapy - Recommend cessation  No family at bedside today.   Best Practice (right click and "Reselect all SmartList Selections" daily)  Diet/type: Regular consistency + TF DVT prophylaxis: heparin  gtt GI prophylaxis: N/A Lines: tunneled HD catheter Foley:  No Code Status:  full code Last date of multidisciplinary goals of care discussion: 1/19: wife updated at bedside    Steffanie Dunn, DO 04/09/23 5:23 PM [facility]  For contact information, see Amion. If no  response to pager, please call PCCM consult pager. After hours, 7PM- 7AM, please call Elink.

## 2023-04-09 NOTE — Progress Notes (Addendum)
Zachary Parrish  Assessment/ Plan: Pt is a 83 y.o. yo male  with PMH significant for hypertension, type II DM, CAD, A-fib, CKD (f/b Dr. Glenna Parrish) was admitted with shortness of breath, seen as a consultation for the evaluation and management of acute kidney injury.   # Acute kidney injury on CKD IV b/l cr around 2.5-2.9: AKI likely ischemic ATN due to septic shock.  Kidney ultrasound with no hydronephrosis.  Refractory to diuretics and remained hypotensive therefore moved to ICU on 1/15.  During this hospitalization -7 L -> neg 5.7L.  Initiated CRRT (500/300/1500) on 1/15-1/21 after placement of temporary HD catheter by PCCM.    CVP was only 5 @ time CRRT off on 1/21 AM; 0 mL UOP /24hrs. new cath left IJ temp cath placed on 1/24, tolerated 3 hours intermittent HD yesterday with 2 L net UF.  Next HD Monday if needed.  Chest CT with contrast on January 24 revealed no improvement in the right lung empyema/loculated hydropneumothorax and also an enhancing lesion in the right lower lobe.  # Septic shock/staph bacteremia: Pressors per primary team.  Antibiotics per primary team  # Right pleural effusion status post right chest tube placement and drainage of fluid.  Plan per PCCM.   # Peripheral edema/fluid overload: Did not respond with IV diuretics and unable to use further diuresis because of persistent hypotension.  Started CRRT as discussed above.   # Acute hypoxic respiratory failure: Wean oxygen as able # Acute CHF: optimizing volume status w/ crrt # A-fib with RVR  Subjective: In bed sitting. Spouse bedside, poor appetite but denies nausea.  CRRT stopped 1/21 AM and tolerated HD 1/24.  Objective Vital signs in last 24 hours: Vitals:   04/09/23 0500 04/09/23 0530 04/09/23 0600 04/09/23 0700  BP: (!) 104/58 93/77 108/61   Pulse: (!) 107 (!) 119 94   Resp: (!) 24 (!) 25 20   Temp:    98.8 F (37.1 C)  TempSrc:    Oral  SpO2: 98% 100% 97%   Weight:  94.2 kg     Height:       Weight change:   Intake/Output Summary (Last 24 hours) at 04/09/2023 0810 Last data filed at 04/09/2023 1610 Gross per 24 hour  Intake 1128.84 ml  Output 2000 ml  Net -871.16 ml       Labs: RENAL PANEL Recent Labs  Lab 04/04/23 0729 04/04/23 1639 04/05/23 0308 04/05/23 1558 04/06/23 0355 04/06/23 0356 04/07/23 0246 04/07/23 1621 04/07/23 2201 04/08/23 0237 04/08/23 1541 04/08/23 2203  NA 134*   < > 133*   < > 133*   < > 128* 126* 123* 124* 124* 125*  K 4.5   < > 4.5   < > 5.1   < > 5.3* 5.6* 5.8* 5.9* 5.6* 4.9  CL 100   < > 101   < > 99   < > 95* 91* 89* 89* 89* 92*  CO2 27   < > 26   < > 26   < > 23 22 22  20* 21* 23  GLUCOSE 224*   < > 230*   < > 214*   < > 179* 204* 190* 192* 184* 240*  BUN 27*   < > 27*   < > 57*   < > 88* 106* 115* 120* 122* 70*  CREATININE 1.50*   < > 1.56*   < > 2.93*   < > 4.30* 5.15* 5.65* 5.66* 6.09* 3.91*  CALCIUM 7.6*   < > 7.5*   < > 7.9*   < > 8.0* 8.2* 7.9* 8.1* 7.8* 7.5*  MG 2.3  --  2.3  --  2.4  --  2.5*  --   --  2.6*  --   --   PHOS 1.4*   < > 2.0*   < > 4.2   < > 4.7* 4.5  --  5.0* 5.4* 4.5  ALBUMIN 1.9*   < >  --    < >  --    < > 2.0* 2.0* 1.9* 1.9* 1.9* 1.9*   < > = values in this interval not displayed.    Liver Function Tests: Recent Labs  Lab 04/07/23 2201 04/08/23 0237 04/08/23 1541 04/08/23 2203  AST 43*  --   --   --   ALT <5  --   --   --   ALKPHOS 86  --   --   --   BILITOT 0.4  --   --   --   PROT 6.1*  --   --   --   ALBUMIN 1.9* 1.9* 1.9* 1.9*   No results for input(s): "LIPASE", "AMYLASE" in the last 168 hours. No results for input(s): "AMMONIA" in the last 168 hours. CBC: Recent Labs    04/05/23 0308 04/06/23 0355 04/07/23 0246 04/08/23 0237 04/08/23 2203  HGB 7.6* 7.6* 7.7* 6.9* 7.4*  MCV 100.0 100.9* 101.7* 100.5* 97.8    Cardiac Enzymes: No results for input(s): "CKTOTAL", "CKMB", "CKMBINDEX", "TROPONINI" in the last 168 hours. CBG: Recent Labs  Lab  04/08/23 0632 04/08/23 1134 04/08/23 1700 04/08/23 2149 04/09/23 0707  GLUCAP 184* 197* 154* 250* 179*    Iron Studies: No results for input(s): "IRON", "TIBC", "TRANSFERRIN", "FERRITIN" in the last 72 hours. Studies/Results: DG CHEST PORT 1 VIEW Result Date: 04/08/2023 CLINICAL DATA:  Post central line placement. EXAM: PORTABLE CHEST 1 VIEW COMPARISON:  Earlier same day; chest CT-earlier same day FINDINGS: Unchanged enlarged cardiac silhouette and mediastinal contours with atherosclerotic plaque within the thoracic aorta. Interval placement left jugular approach central venous catheter with tip projected over the mid SVC. No definite left-sided pneumothorax, though evaluation degraded secondary to patient's overlying chin. Enteric tube tip below the left hemidiaphragm. Unchanged moderate-sized partially loculated right-sided effusion with associated right mid and lower lung heterogeneous/consolidative opacities. Known right-sided hydropneumothorax demonstrated on preceding chest CT is not well demonstrated on this portable examination. Unchanged trace left-sided effusion associated left basilar opacities. No evidence of edema. No acute osseous abnormalities. Atherosclerotic plaque overlies the left axilla. IMPRESSION: 1. Interval placement of left jugular approach central venous catheter with tip projected over the mid SVC. No definite pneumothorax. 2. Unchanged moderate-sized partially loculated right-sided effusion with associated right mid and lower lung heterogeneous/consolidative opacities. Known right-sided hydropneumothorax, demonstrated on preceding chest CT, is not well demonstrated on the present examination. Electronically Signed   By: Simonne Come M.D.   On: 04/08/2023 15:15   DG ESOPHAGUS W SINGLE CM (SOL OR THIN BA) Result Date: 04/08/2023 CLINICAL DATA:  Oropharyngeal dysphagia. Concern for esophageal dysmotility seen on modified barium swallow. EXAM: ESOPHAGUS/BARIUM SWALLOW/TABLET  STUDY TECHNIQUE: Single contrast examination was performed using thin liquid barium. This exam was performed by Corrin Parker, PA-C, and was supervised and interpreted by Dr. Simonne Come. FLUOROSCOPY: Radiation Exposure Index (as provided by the fluoroscopic device): 25.6 mGy Kerma COMPARISON:  Modified barium swallow done April 07, 2023 FINDINGS: Swallowing: Appears normal. No vestibular penetration or aspiration seen.  Esophagus: No mucosal lesions. Apparent mild tapered narrowing of the distal esophagus potentially suggestive of a nonocclusive stricture. Esophageal motility: Mild-to-moderate esophageal dysmotility with tertiary contractions and lack of dominant peristaltic stripping wave. Hiatal Hernia: No hiatal hernia seen. Gastroesophageal reflux: Unable to fully assess gastroesophageal reflux secondary to patient's immobility and limited study. Ingested 13 mm barium tablet: Patient was unable to swallow barium tablet. Other: Study significantly limited secondary to patient's immobility. IMPRESSION: 1. Mild-to-moderate esophageal dysmotility. 2. Mild tapered narrowing of the distal esophagus without occlusion. Further evaluation and management with endoscopy could be performed as indicated. Electronically Signed   By: Simonne Come M.D.   On: 04/08/2023 15:00   CT CHEST ABDOMEN PELVIS W CONTRAST Result Date: 04/08/2023 CLINICAL DATA:  83 year old male with COPD exacerbation, pain. History of empyema with right lung drain recently. EXAM: CT CHEST, ABDOMEN, AND PELVIS WITH CONTRAST TECHNIQUE: Multidetector CT imaging of the chest, abdomen and pelvis was performed following the standard protocol during bolus administration of intravenous contrast. RADIATION DOSE REDUCTION: This exam was performed according to the departmental dose-optimization program which includes automated exposure control, adjustment of the mA and/or kV according to patient size and/or use of iterative reconstruction technique. CONTRAST:  75mL  OMNIPAQUE IOHEXOL 350 MG/ML SOLN COMPARISON:  Portable chest this morning.  Chest CT 04/03/2023. FINDINGS: CT CHEST FINDINGS Cardiovascular: Extensive Calcified aortic atherosclerosis. Stable borderline to mild cardiomegaly. No pericardial effusion. Mediastinum/Nodes: Enteric tube within the esophagus with mild retained food or secretions proximally, but elsewhere the thoracic esophagus is decompressed. No mediastinal mass or lymphadenopathy. Lungs/Pleura: Trace layering left pleural effusion. Respiratory motion. Left lung otherwise grossly negative. Major airways are patent. Complete right lung loculated right hydropneumothorax with superimposed masslike and enhancing 6 cm right lower lobe lesion on series 3, image 39. this corresponds to the same suspicious area on the previous noncontrast CT. Adjacent pleural thickening, loculated fluid and gas (series 3, image 44). Air-fluid level with pleural thickening in the right lung apex. Occasional other gas loculations. The percutaneous drain has been removed. The pleural fluid component has increased from the previous CT, but otherwise the lung configuration is not significantly changed. Musculoskeletal: Rib motion artifact. Otherwise stable. No acute or suspicious osseous lesion identified. CT ABDOMEN PELVIS FINDINGS Hepatobiliary: Liver remains within normal limits subjacent to elevated hemidiaphragm. Gallbladder is contracted. Pancreas: Negative. Spleen: Negative. Adrenals/Urinary Tract: Negative adrenal glands. Decompressed renal collecting system, ureters, and bladder. Symmetric renal enhancement but no renal contrast excretion on the delayed images. Stomach/Bowel: Dense oral contrast intermittently in the bowel. Enteric feeding tube terminates in the proximal gastric body on series 3, image 46. The stomach is moderately distended with fluid. The duodenum is decompressed. Motion artifact elsewhere in the abdomen and pelvis. No dilated small bowel. Cecum is on a lax  mesentery. Distal large bowel diverticulosis. No bowel wall thickening, free air or free fluid identified. Vascular/Lymphatic: Aortoiliac calcified atherosclerosis. Grossly patent major arterial structures, motion artifact which also degrades evaluation of the portal venous system. No lymphadenopathy. Reproductive: Negative. Other: No pelvis free fluid. Bilateral flank and hip body wall, subcutaneous edema which is asymmetric, somewhat greater on the right. Musculoskeletal: Spine and hip degeneration. No acute or suspicious osseous lesion. IMPRESSION: 1. Recent right chest tube removed. No improvement in right lung empyema / loculated hydropneumothorax, and ongoing roughly 6 cm superimposed Mass-like area in the Right Lower Lobe which is enhancing following contrast. Right lung ventilation not significantly changed from 04/03/2023. Trace layering left pleural effusion. 2. No other acute finding  in the Chest. 3. Stomach moderately distended with fluid and oral contrast. But otherwise decompressed bowel in the abdomen and pelvis. Enteric feeding tube terminates at the proximal gastric body. 4.  Aortic Atherosclerosis (ICD10-I70.0). Electronically Signed   By: Odessa Fleming M.D.   On: 04/08/2023 11:45   DG Chest Port 1 View Result Date: 04/08/2023 CLINICAL DATA:  Loculated empyema EXAM: PORTABLE CHEST 1 VIEW COMPARISON:  04/06/2023 FINDINGS: Rotated AP portable examination. Previously seen large-bore right neck multi lumen vascular catheter removed. Non weighted enteric feeding tube remains with tip in the gastric fundus. Unchanged moderate, loculated right pleural effusion and small, layering left pleural effusion. No new airspace opacity. Cardiomegaly. No acute osseous findings. IMPRESSION: 1. Unchanged moderate, loculated right pleural effusion and small, layering left pleural effusion. No new airspace opacity. 2. Previously seen large-bore right neck multi lumen vascular catheter removed. Non weighted enteric feeding  tube remains with tip in the gastric fundus. Electronically Signed   By: Jearld Lesch M.D.   On: 04/08/2023 10:16   DG Swallowing Func-Speech Pathology Result Date: 04/07/2023 Table formatting from the original result was not included. Modified Barium Swallow Study Patient Details Name: Zachary Parrish MRN: [id number] Date of Birth: 1940-11-23 Today's Date: 04/07/2023 HPI/PMH: HPI: Mr. Craver is an 83 year old gentleman admitted 1/14 presenting with greater than 2 months of progressive shortness of breath and leg edema. Over the last 2 weeks he has felt worse such that he is short of breath even at rest  He has been sleeping sitting upright sometimes in his car to prop him up enough. Pt also with afib. Pt with chest tube placed 1/14 due to pleural effusion. Pt  found to have AKI d/t ischemic ATN r/t septic shock with cultures growing staph aureus at chest tube 1/16. Pt transferred to ICU and CRRT initiated 1/16. PMH: A-fib many years ago, status post ablation and not recurrent so not on anticoagulation, CKD 3B Clinical Impression: Clinical Impression: Patient presents with a mild oropharyngeal dysphagia. Oral phase marked by mild oral transit delays and mild oral residuals, both likely due to increased WOB at baseline impacting coordination of oral phase. Swallow initiation largely with occassional iniitation at the level of the vallecula. Primary deficit occuring at laryngeal vestibule with decreased closure of unknown definitive origin as hyolaryngeal movement appears WFL. This resulted in aspiration of thin liquid with subtle, ineffective throat clear in response. Cue for strong cough successful to clear and use of chin tuck in combination with small sips via cup or straw effective to prevent for the remainder of the study. Mild oral residuals cleared with spontaneous dry swallow. Esophageal sweep did reveal what appeared to be a moderate degree of post prandial residuals, not clearing with liquid wash. This could  account for a decrease in supraglottic pressure and some decrease in laryngeal closure, as well as contribute to post prandial coughing (reflux cough) noted by RN at bedside and confirmed by this SLP during todays study in which patient with strong cough post study wtihout observation of penetration or aspiration. Further, Parrish that view somewhat obstructed by wide shoulders and decreased ability to hold himself strongly upright. Recommend diet modification and  SLP f/u for compensatory strategy training. With decreased WOB, swallowing function may improve. Factors that may increase risk of adverse event in presence of aspiration [name] [employer] Clearance Coots 2021): Factors that may increase risk of adverse event in presence of aspiration [name] & Clearance Coots 2021): Respiratory or GI disease; Poor general health and/or compromised immunity; Limited  mobility Recommendations/Plan: Swallowing Evaluation Recommendations Swallowing Evaluation Recommendations Recommendations: PO diet PO Diet Recommendation: Dysphagia 3 (Mechanical soft); Thin liquids (Level 0) Liquid Administration via: Cup; Straw Medication Administration: Whole meds with puree Supervision: Patient able to self-feed; Full assist for feeding Swallowing strategies  : Follow solids with liquids; Chin tuck Postural changes: Stay upright 30-60 min after meals; Position pt fully upright for meals Oral care recommendations: Oral care BID (2x/day) Recommended consults: Consider esophageal assessment Treatment Plan Treatment Plan Treatment recommendations: Therapy as outlined in treatment plan below Follow-up recommendations: Other (comment) Recommendations Comment: TBD Functional status assessment: Patient has had a recent decline in their functional status and demonstrates the ability to make significant improvements in function in a reasonable and predictable amount of time. Treatment frequency: Min 2x/week Treatment duration: 2 weeks Interventions: Aspiration precaution  training; Compensatory techniques; Patient/family education; Trials of upgraded texture/liquids; Diet toleration management by SLP Recommendations Recommendations for follow up therapy are one component of a multi-disciplinary discharge planning process, led by the attending physician.  Recommendations may be updated based on patient status, additional functional criteria and insurance authorization. Assessment: Orofacial Exam: Orofacial Exam Oral Cavity: Oral Hygiene: WFL Oral Cavity - Dentition: Dentures, top; Dentures, bottom Orofacial Anatomy: WFL Oral Motor/Sensory Function: WFL Anatomy: Anatomy: WFL Boluses Administered: Boluses Administered Boluses Administered: Thin liquids (Level 0); Mildly thick liquids (Level 2, nectar thick); Moderately thick liquids (Level 3, honey thick); Puree; Solid  Oral Impairment Domain: Oral Impairment Domain Lip Closure: Escape progressing to mid-chin Tongue control during bolus hold: Escape to lateral buccal cavity/floor of mouth Bolus preparation/mastication: Slow prolonged chewing/mashing with complete recollection Bolus transport/lingual motion: Delayed initiation of tongue motion (oral holding) Oral residue: Residue collection on oral structures Location of oral residue : Tongue; Lateral sulci Initiation of pharyngeal swallow : Posterior angle of the ramus  Pharyngeal Impairment Domain: Pharyngeal Impairment Domain Soft palate elevation: No bolus between soft palate (SP)/pharyngeal wall (PW) Laryngeal elevation: Complete superior movement of thyroid cartilage with complete approximation of arytenoids to epiglottic petiole Anterior hyoid excursion: Partial anterior movement Epiglottic movement: Complete inversion Laryngeal vestibule closure: Incomplete, narrow column air/contrast in laryngeal vestibule Pharyngeal stripping wave : -- (difficult to view) Pharyngeal contraction (A/P view only): N/A Pharyngoesophageal segment opening: Complete distension and complete duration,  no obstruction of flow Tongue base retraction: Trace column of contrast or air between tongue base and PPW Pharyngeal residue: Complete pharyngeal clearance  Esophageal Impairment Domain: Esophageal Impairment Domain Esophageal clearance upright position: Esophageal retention Pill: Pill Consistency administered: Puree Puree: WFL Penetration/Aspiration Scale Score: Penetration/Aspiration Scale Score 1.  Material does not enter airway: Mildly thick liquids (Level 2, nectar thick); Moderately thick liquids (Level 3, honey thick); Puree; Solid; Pill 7.  Material enters airway, passes BELOW cords and not ejected out despite cough attempt by patient: Thin liquids (Level 0) Compensatory Strategies: Compensatory Strategies Compensatory strategies: Yes Chin tuck: Effective Effective Chin Tuck: Thin liquid (Level 0)   General Information: Caregiver present: No  Diet Prior to this Study: Regular; Thin liquids (Level 0)   Temperature : Normal   Respiratory Status: Increased WOB   Supplemental O2: Nasal cannula   History of Recent Intubation: No  Behavior/Cognition: Alert; Cooperative; Pleasant mood Self-Feeding Abilities: Able to self-feed Baseline vocal quality/speech: Normal Volitional Cough: Able to elicit Volitional Swallow: Able to elicit Exam Limitations: Limited visibility; Poor positioning Goal Planning: Prognosis for improved oropharyngeal function: Fair No data recorded No data recorded No data recorded Consulted and agree with results and recommendations: Patient; Nurse Pain:  Pain Assessment Pain Assessment: No/denies pain Faces Pain Scale: 2 Facial Expression: 0 Body Movements: 0 Muscle Tension: 0 Compliance with ventilator (intubated pts.): N/A Vocalization (extubated pts.): 0 CPOT Total: 0 Pain Location: bottom Pain Descriptors / Indicators: Discomfort; Grimacing; Sore Pain Intervention(s): Limited activity within patient's tolerance; Monitored during session End of Session: Start Time:SLP Start Time (ACUTE ONLY):  1230 Stop Time: SLP Stop Time (ACUTE ONLY): 1252 Time Calculation:SLP Time Calculation (min) (ACUTE ONLY): 22 min Charges: SLP Evaluations $ SLP Speech Visit: 1 Visit SLP Evaluations $MBS Swallow: 1 Procedure SLP visit diagnosis: SLP Visit Diagnosis: Dysphagia, unspecified (R13.10) Past Medical History: Past Medical History: Diagnosis Date  Acute renal failure (ARF) (HCC) 05/31/2019  B12 deficiency 08/02/2019  Bilateral carotid artery stenosis 05/31/2021  CAD (coronary artery disease) 2014  60-70% calcified LM lesion, high grade OM lesion, treated medically.  Chronic kidney disease, stage IV (severe) (HCC) 07/19/2019  Coronary artery disease involving native coronary artery of native heart without angina pectoris 07/19/2019  DM (diabetes mellitus) (HCC)   no longer on medications  Dyslipidemia 11/22/2019  Essential hypertension 07/19/2019  Gout   Gouty arthritis 09/06/2019  HTN (hypertension)   MSSA (methicillin susceptible Staphylococcus aureus) septicemia (HCC) 06/09/2012  Nonrheumatic aortic valve insufficiency 07/19/2019  OSA (obstructive sleep apnea) 04/19/2013  Dr Jerre Simon    PAF (paroxysmal atrial fibrillation) (HCC) 06/03/2019  during admission for DRESS  Shock (HCC) 05/31/2019 Past Surgical History: Past Surgical History: Procedure Laterality Date  CARDIAC CATHETERIZATION  2014  CARDIOVERSION N/A 06/07/2019  Procedure: CARDIOVERSION;  Surgeon: Little Ishikawa, MD;  Location: [facility] ENDOSCOPY;  Service: Cardiovascular;  Laterality: N/A;  TEE WITHOUT CARDIOVERSION N/A 06/07/2019  Procedure: TRANSESOPHAGEAL ECHOCARDIOGRAM (TEE);  Surgeon: Little Ishikawa, MD;  Location: [facility] ENDOSCOPY;  Service: Cardiovascular;  Laterality: N/A; Ferdinand Lango MA, CCC-SLP McCoy Leah Meryl 04/07/2023, 1:28 PM   Medications: Infusions:   ceFAZolin (ANCEF) IV     heparin 1,100 Units/hr (04/09/23 1610)   norepinephrine (LEVOPHED) Adult infusion Stopped (04/06/23 0951)    Scheduled Medications:  amiodarone  400 mg Oral  BID   aspirin  81 mg Oral Daily   atorvastatin  40 mg Oral Daily   Chlorhexidine Gluconate Cloth  6 each Topical Daily   Chlorhexidine Gluconate Cloth  6 each Topical Q0600   feeding supplement  237 mL Oral TID BM   feeding supplement (PROSource TF20)  60 mL Per Tube TID   feeding supplement (VITAL 1.5 CAL)  720 mL Per Tube Q24H   folic acid  1 mg Per Tube Daily   insulin aspart  0-5 Units Subcutaneous QHS   insulin aspart  0-9 Units Subcutaneous TID WC   lidocaine  1 patch Transdermal Q24H   metoCLOPramide (REGLAN) injection  10 mg Intravenous Q8H   midazolam  2 mg Intravenous Once   midodrine  10 mg Oral Once in dialysis   midodrine  15 mg Oral TID WC   multivitamin  1 tablet Oral BID   nicotine  21 mg Transdermal Daily   mouth rinse  15 mL Mouth Rinse 4 times per day   predniSONE  20 mg Per Tube Q breakfast   sodium zirconium cyclosilicate  10 g Per Tube BID   thiamine  100 mg Per Tube Daily    have reviewed scheduled and prn medications.  Physical Exam: General:NAD, resting in bed Heart:normal rate, no rub Lungs: Coarse BS Abdomen:soft, Non-tender, non-distended Extremities:no edema at the ankles bilaterally Dialysis Access: LIJ temporary (placed 1/24)  Aaliah Jorgenson  W 04/09/2023,8:10 AM  LOS: 11 days

## 2023-04-09 NOTE — Progress Notes (Signed)
PHARMACY - ANTICOAGULATION CONSULT NOTE  Pharmacy Consult for IV heparin Indication: atrial fibrillation  Allergies  Allergen Reactions   Allopurinol     Possible DRESS syndrome   Colchicine     Possible DRESS syndrome    Patient Measurements: Height: 6\' 1"  (185.4 cm) Weight: 94.2 kg (207 lb 10.8 oz) IBW/kg (Calculated) : 79.9 Heparin Dosing Weight: 89 kg  Vital Signs: Temp: 98.2 F (36.8 C) (01/25 1130) Temp Source: Oral (01/25 1130) BP: 109/92 (01/25 1200) Pulse Rate: 115 (01/25 1211)  Labs: Recent Labs    04/07/23 0246 04/07/23 1621 04/08/23 0237 04/08/23 1541 04/08/23 2203 04/09/23 0802  HGB 7.7*  --  6.9*  --  7.4*  --   HCT 24.0*  --  20.6*  --  21.8*  --   PLT 281  --  281  --  293  --   APTT 92*  --   --   --   --   --   HEPARINUNFRC 0.18*  --  0.29*  --   --  0.24*  CREATININE 4.30*   < > 5.66* 6.09* 3.91* 4.80*   < > = values in this interval not displayed.    Estimated Creatinine Clearance: 13.6 mL/min (A) (by C-G formula based on SCr of 4.8 mg/dL (H)).   Medical History: Past Medical History:  Diagnosis Date   Acute renal failure (ARF) (HCC) 05/31/2019   B12 deficiency 08/02/2019   Bilateral carotid artery stenosis 05/31/2021   CAD (coronary artery disease) 2014   60-70% calcified LM lesion, high grade OM lesion, treated medically.   Chronic kidney disease, stage IV (severe) (HCC) 07/19/2019   Coronary artery disease involving native coronary artery of native heart without angina pectoris 07/19/2019   DM (diabetes mellitus) (HCC)    no longer on medications   Dyslipidemia 11/22/2019   Essential hypertension 07/19/2019   Gout    Gouty arthritis 09/06/2019   HTN (hypertension)    MSSA (methicillin susceptible Staphylococcus aureus) septicemia (HCC) 06/09/2012   Nonrheumatic aortic valve insufficiency 07/19/2019   OSA (obstructive sleep apnea) 04/19/2013   Dr Jerre Simon     PAF (paroxysmal atrial fibrillation) (HCC) 06/03/2019   during  admission for DRESS   Shock (HCC) 05/31/2019    Medications:  Infusions:    ceFAZolin (ANCEF) IV 1 g (04/09/23 1253)   heparin 1,200 Units/hr (04/09/23 1200)   norepinephrine (LEVOPHED) Adult infusion Stopped (04/06/23 1478)    Assessment: 83 yo male with new afib, previously on heparin this admission, then it was held for intrapleural thrombolytics. Pharmacy asked to resume.  No overt bleeding or complications noted, CBC fairly stable. CRRT heparin syringe turned off 1/19  Heparin level 0.24 on heparin at 1100 units/hr, just below goal range.  No overt bleeding or complications noted.  CBC stable.  Goal of Therapy:  Heparin level 0.3-0.7 units/ml Monitor platelets by anticoagulation protocol: Yes   Plan:  Increase IV heparin to 1200 units/hr Repeat heparin level in 8 hrs. Daily heparin level and CBC.   Reece Leader, Colon Flattery, BCCP Clinical Pharmacist  04/09/2023 2:12 PM   [facility] pharmacy phone numbers are listed on [email or web address]

## 2023-04-09 NOTE — Progress Notes (Signed)
PHARMACY - ANTICOAGULATION CONSULT NOTE  Pharmacy Consult for IV heparin Indication: atrial fibrillation  Allergies  Allergen Reactions   Allopurinol     Possible DRESS syndrome   Colchicine     Possible DRESS syndrome    Patient Measurements: Height: 6\' 1"  (185.4 cm) Weight: 94.2 kg (207 lb 10.8 oz) IBW/kg (Calculated) : 79.9 Heparin Dosing Weight: 89 kg  Vital Signs: Temp: 97.7 F (36.5 C) (01/25 1943) Temp Source: Axillary (01/25 1943) BP: 121/57 (01/25 2000) Pulse Rate: 109 (01/25 2000)  Labs: Recent Labs    04/07/23 0246 04/07/23 1621 04/08/23 0237 04/08/23 1541 04/08/23 2203 04/09/23 0802 04/09/23 1843 04/09/23 1950  HGB 7.7*  --  6.9*  --  7.4*  --   --   --   HCT 24.0*  --  20.6*  --  21.8*  --   --   --   PLT 281  --  281  --  293  --   --   --   APTT 92*  --   --   --   --   --   --   --   HEPARINUNFRC 0.18*  --  0.29*  --   --  0.24*  --  0.32  CREATININE 4.30*   < > 5.66*   < > 3.91* 4.80* 5.09*  --    < > = values in this interval not displayed.    Estimated Creatinine Clearance: 12.9 mL/min (A) (by C-G formula based on SCr of 5.09 mg/dL (H)).   Medical History: Past Medical History:  Diagnosis Date   Acute renal failure (ARF) (HCC) 05/31/2019   B12 deficiency 08/02/2019   Bilateral carotid artery stenosis 05/31/2021   CAD (coronary artery disease) 2014   60-70% calcified LM lesion, high grade OM lesion, treated medically.   Chronic kidney disease, stage IV (severe) (HCC) 07/19/2019   Coronary artery disease involving native coronary artery of native heart without angina pectoris 07/19/2019   DM (diabetes mellitus) (HCC)    no longer on medications   Dyslipidemia 11/22/2019   Essential hypertension 07/19/2019   Gout    Gouty arthritis 09/06/2019   HTN (hypertension)    MSSA (methicillin susceptible Staphylococcus aureus) septicemia (HCC) 06/09/2012   Nonrheumatic aortic valve insufficiency 07/19/2019   OSA (obstructive sleep apnea)  04/19/2013   Dr Jerre Simon     PAF (paroxysmal atrial fibrillation) (HCC) 06/03/2019   during admission for DRESS   Shock (HCC) 05/31/2019    Medications:  Infusions:    ceFAZolin (ANCEF) IV Stopped (04/09/23 1324)   heparin 1,200 Units/hr (04/09/23 2000)   norepinephrine (LEVOPHED) Adult infusion Stopped (04/06/23 4098)    Assessment: 83 yo male with new afib, previously on heparin this admission, then it was held for intrapleural thrombolytics. Pharmacy asked to resume.  No overt bleeding or complications noted, CBC fairly stable. CRRT heparin syringe turned off 1/19  -Heparin level 0.32, therapeutic on 1200 units/hr  -Hgb 7.4 this AM, up  -No infusion issues, bleeding   Goal of Therapy:  Heparin level 0.3-0.7 units/ml Monitor platelets by anticoagulation protocol: Yes   Plan:  Continue IV heparin 1200 units/hr Daily heparin level and CBC. Monitor s/sx bleeding  Trixie Rude, PharmD Clinical Pharmacist 04/09/2023  8:20 PM

## 2023-04-09 NOTE — Plan of Care (Signed)
  Problem: Clinical Measurements: Goal: Diagnostic test results will improve Outcome: Progressing Goal: Cardiovascular complication will be avoided Outcome: Progressing   Problem: Nutrition: Goal: Adequate nutrition will be maintained Outcome: Progressing   Problem: Coping: Goal: Level of anxiety will decrease Outcome: Progressing   Problem: Pain Management: Goal: General experience of comfort will improve Outcome: Progressing   Problem: Safety: Goal: Ability to remain free from injury will improve Outcome: Progressing

## 2023-04-10 DIAGNOSIS — J869 Pyothorax without fistula: Secondary | ICD-10-CM | POA: Diagnosis not present

## 2023-04-10 DIAGNOSIS — J9819 Other pulmonary collapse: Secondary | ICD-10-CM | POA: Diagnosis not present

## 2023-04-10 DIAGNOSIS — J9601 Acute respiratory failure with hypoxia: Secondary | ICD-10-CM | POA: Diagnosis not present

## 2023-04-10 DIAGNOSIS — R5381 Other malaise: Secondary | ICD-10-CM | POA: Diagnosis not present

## 2023-04-10 LAB — RENAL FUNCTION PANEL
Albumin: 1.9 g/dL — ABNORMAL LOW (ref 3.5–5.0)
Albumin: 1.9 g/dL — ABNORMAL LOW (ref 3.5–5.0)
Anion gap: 15 (ref 5–15)
Anion gap: 16 — ABNORMAL HIGH (ref 5–15)
BUN: 118 mg/dL — ABNORMAL HIGH (ref 8–23)
BUN: 135 mg/dL — ABNORMAL HIGH (ref 8–23)
CO2: 24 mmol/L (ref 22–32)
CO2: 23 mmol/L (ref 22–32)
Calcium: 8.1 mg/dL — ABNORMAL LOW (ref 8.9–10.3)
Calcium: 7.9 mg/dL — ABNORMAL LOW (ref 8.9–10.3)
Chloride: 87 mmol/L — ABNORMAL LOW (ref 98–111)
Chloride: 85 mmol/L — ABNORMAL LOW (ref 98–111)
Creatinine, Ser: 5.83 mg/dL — ABNORMAL HIGH (ref 0.61–1.24)
Creatinine, Ser: 6.34 mg/dL — ABNORMAL HIGH (ref 0.61–1.24)
GFR, Estimated: 9 mL/min — ABNORMAL LOW (ref >60)
GFR, Estimated: 8 mL/min — ABNORMAL LOW (ref >60)
Glucose, Bld: 145 mg/dL — ABNORMAL HIGH (ref 70–99)
Glucose, Bld: 258 mg/dL — ABNORMAL HIGH (ref 70–99)
Phosphorus: 7 mg/dL — ABNORMAL HIGH (ref 2.5–4.6)
Phosphorus: 7.2 mg/dL — ABNORMAL HIGH (ref 2.5–4.6)
Potassium: 5.7 mmol/L — ABNORMAL HIGH (ref 3.5–5.1)
Potassium: 6.1 mmol/L — ABNORMAL HIGH (ref 3.5–5.1)
Sodium: 126 mmol/L — ABNORMAL LOW (ref 135–145)
Sodium: 124 mmol/L — ABNORMAL LOW (ref 135–145)

## 2023-04-10 LAB — EXPECTORATED SPUTUM ASSESSMENT W GRAM STAIN, RFLX TO RESP C

## 2023-04-10 LAB — GLUCOSE, CAPILLARY
Glucose-Capillary: 166 mg/dL — ABNORMAL HIGH (ref 70–99)
Glucose-Capillary: 211 mg/dL — ABNORMAL HIGH (ref 70–99)
Glucose-Capillary: 221 mg/dL — ABNORMAL HIGH (ref 70–99)
Glucose-Capillary: 180 mg/dL — ABNORMAL HIGH (ref 70–99)

## 2023-04-10 LAB — MAGNESIUM: Magnesium: 2.4 mg/dL (ref 1.7–2.4)

## 2023-04-10 LAB — HEPARIN LEVEL (UNFRACTIONATED)
Heparin Unfractionated: 0.24 [IU]/mL — ABNORMAL LOW (ref 0.30–0.70)
Heparin Unfractionated: 0.25 [IU]/mL — ABNORMAL LOW (ref 0.30–0.70)

## 2023-04-10 MED ORDER — SODIUM ZIRCONIUM CYCLOSILICATE 10 G PO PACK
10.0000 g | PACK | Freq: Once | ORAL | Status: AC
  Administered 2023-04-10: 10 g
  Filled 2023-04-10: qty 1

## 2023-04-10 MED ORDER — HYDROXYZINE HCL 10 MG PO TABS
10.0000 mg | ORAL_TABLET | Freq: Three times a day (TID) | ORAL | Status: DC | PRN
  Administered 2023-04-10: 10 mg
  Filled 2023-04-10 (×2): qty 1

## 2023-04-10 MED ORDER — ALBUTEROL SULFATE (2.5 MG/3ML) 0.083% IN NEBU
10.0000 mg | INHALATION_SOLUTION | Freq: Once | RESPIRATORY_TRACT | Status: AC
  Administered 2023-04-10: 10 mg via RESPIRATORY_TRACT
  Filled 2023-04-10: qty 12

## 2023-04-10 MED ORDER — INSULIN GLARGINE-YFGN 100 UNIT/ML ~~LOC~~ SOLN
3.0000 [IU] | Freq: Once | SUBCUTANEOUS | Status: AC
  Administered 2023-04-10: 3 [IU] via SUBCUTANEOUS
  Filled 2023-04-10: qty 0.03

## 2023-04-10 MED ORDER — INSULIN GLARGINE-YFGN 100 UNIT/ML ~~LOC~~ SOLN: 8.0000 [IU] | Freq: Every day | SUBCUTANEOUS | Status: DC

## 2023-04-10 NOTE — Progress Notes (Signed)
eLink Physician-Brief Progress Note Patient Name: Zachary Parrish DOB: [birth year] MRN: [id number]   Date of Service  04/10/2023  HPI/Events of Note  Hyperkalemia on a.m. labs.  Patient is already on Lokelma every 8 hours.  eICU Interventions  Will give a one-time dose of albuterol 10 mg by by inhalation.  Continue Lokelma.  Bedside RN informed.     Intervention Category Minor Interventions: Electrolytes abnormality - evaluation and management  Carilyn Goodpasture 04/10/2023, 6:06 AM

## 2023-04-10 NOTE — Progress Notes (Signed)
PHARMACY - ANTICOAGULATION CONSULT NOTE  Pharmacy Consult for IV heparin Indication: atrial fibrillation  Allergies  Allergen Reactions   Allopurinol     Possible DRESS syndrome   Colchicine     Possible DRESS syndrome    Patient Measurements: Height: 6\' 1"  (185.4 cm) Weight: 96.9 kg (213 lb 10 oz) IBW/kg (Calculated) : 79.9 Heparin Dosing Weight: 89 kg  Vital Signs: Temp: 98.7 F (37.1 C) (01/26 1600) Temp Source: Oral (01/26 1600) BP: 138/90 (01/26 1630) Pulse Rate: 123 (01/26 1630)  Labs: Recent Labs    04/08/23 0237 04/08/23 1541 04/08/23 2203 04/09/23 0802 04/09/23 1843 04/09/23 1950 04/10/23 0357 04/10/23 1600  HGB 6.9*  --  7.4*  --   --   --   --   --   HCT 20.6*  --  21.8*  --   --   --   --   --   PLT 281  --  293  --   --   --   --   --   HEPARINUNFRC 0.29*  --   --  0.24*  --  0.32 0.24* 0.25*  CREATININE 5.66*   < > 3.91* 4.80* 5.09*  --  5.83*  --    < > = values in this interval not displayed.    Estimated Creatinine Clearance: 12.2 mL/min (A) (by C-G formula based on SCr of 5.83 mg/dL (H)).   Medical History: Past Medical History:  Diagnosis Date   Acute renal failure (ARF) (HCC) 05/31/2019   B12 deficiency 08/02/2019   Bilateral carotid artery stenosis 05/31/2021   CAD (coronary artery disease) 2014   60-70% calcified LM lesion, high grade OM lesion, treated medically.   Chronic kidney disease, stage IV (severe) (HCC) 07/19/2019   Coronary artery disease involving native coronary artery of native heart without angina pectoris 07/19/2019   DM (diabetes mellitus) (HCC)    no longer on medications   Dyslipidemia 11/22/2019   Essential hypertension 07/19/2019   Gout    Gouty arthritis 09/06/2019   HTN (hypertension)    MSSA (methicillin susceptible Staphylococcus aureus) septicemia (HCC) 06/09/2012   Nonrheumatic aortic valve insufficiency 07/19/2019   OSA (obstructive sleep apnea) 04/19/2013   Dr Jerre Simon     PAF (paroxysmal atrial  fibrillation) (HCC) 06/03/2019   during admission for DRESS   Shock (HCC) 05/31/2019    Medications:  Infusions:    ceFAZolin (ANCEF) IV Stopped (04/10/23 1430)   heparin 1,350 Units/hr (04/10/23 1531)    Assessment: 83 yo male with new afib, previously on heparin this admission, then it was held for intrapleural thrombolytics. Pharmacy asked to resume.  No overt bleeding or complications noted, CBC fairly stable. CRRT heparin syringe turned off 1/19.  -Heparin level 0.25, remains subtherapeutic despite rate increase to 1350 units/hr. -No pauses/interruptions per RN.  No bleeding noted.    Goal of Therapy:  Heparin level 0.3-0.7 units/ml Monitor platelets by anticoagulation protocol: Yes   Plan:  Increase IV heparin to 1500 units/hr Repeat heparin level in 8 hrs. Daily heparin level and CBC. F/u transition to DOAC when appropriate   Trixie Rude, PharmD Clinical Pharmacist 04/10/2023  5:02 PM

## 2023-04-10 NOTE — Progress Notes (Signed)
[facility] NEPHROLOGY PROGRESS NOTE  Assessment/ Plan: Pt is a 83 y.o. yo male  with PMH significant for hypertension, type II DM, CAD, A-fib, CKD (f/b Dr. Glenna Fellows) was admitted with shortness of breath, seen as a consultation for the evaluation and management of acute kidney injury.   # Acute kidney injury on CKD IV b/l cr around 2.5-2.9: AKI likely ischemic ATN due to septic shock.  Kidney ultrasound with no hydronephrosis.  Refractory to diuretics and remained hypotensive therefore moved to ICU on 1/15.  During this hospitalization -7 L -> neg 5.7L.  Initiated CRRT (500/300/1500) on 1/15-1/21 after placement of temporary HD catheter by PCCM.    CVP was only 5 @ time CRRT off on 1/21 AM; 0 mL UOP /24hrs. new cath left IJ temp cath placed on 1/24, tolerated 3 hours intermittent HD Friday late afternoon with 2 L net UF.    Chest CT with contrast on January 24 revealed no improvement in the right lung empyema/loculated hydropneumothorax and also an enhancing lesion in the right lower lobe.    Wrote orders for HD today with another procedure already for Mon (Chest tube) + refractory hyperkalemia despite Lokelma.  # Septic shock/staph bacteremia: Pressors per primary team.  Antibiotics per primary team  # Right pleural effusion status post right chest tube placement and drainage of fluid.  Plan per PCCM.   # Peripheral edema/fluid overload: Did not respond with IV diuretics and unable to use further diuresis because of persistent hypotension.  Started CRRT as discussed above.   # Acute hypoxic respiratory failure: Wean oxygen as able # Acute CHF: optimizing volume status w/ crrt # A-fib with RVR  Subjective: In bed supine at 30-45deg angle. Spouse bedside, poor appetite but denies nausea.  CRRT stopped 1/21 AM and tolerated HD 1/24.  Objective Vital signs in last 24 hours: Vitals:   04/10/23 0530 04/10/23 0600 04/10/23 0630 04/10/23 0800  BP: 128/70 116/65 116/70   Pulse:  96 99 92   Resp: 20 (!) 23 (!) 21   Temp:    97.9 F (36.6 C)  TempSrc:    Oral  SpO2: 95% 95% 96%   Weight:      Height:       Weight change: 17.9 kg  Intake/Output Summary (Last 24 hours) at 04/10/2023 0824 Last data filed at 04/10/2023 0600 Gross per 24 hour  Intake 1494.52 ml  Output --  Net 1494.52 ml       Labs: RENAL PANEL Recent Labs  Lab 04/06/23 0355 04/06/23 0356 04/07/23 0246 04/07/23 1621 04/08/23 0237 04/08/23 1541 04/08/23 2203 04/09/23 0802 04/09/23 1843 04/10/23 0357  NA 133*   < > 128*   < > 124* 124* 125* 127* 126* 126*  K 5.1   < > 5.3*   < > 5.9* 5.6* 4.9 5.1 5.5* 5.7*  CL 99   < > 95*   < > 89* 89* 92* 90* 88* 87*  CO2 26   < > 23   < > 20* 21* 23 25 26 24   GLUCOSE 214*   < > 179*   < > 192* 184* 240* 209* 208* 145*  BUN 57*   < > 88*   < > 120* 122* 70* 87* 103* 118*  CREATININE 2.93*   < > 4.30*   < > 5.66* 6.09* 3.91* 4.80* 5.09* 5.83*  CALCIUM 7.9*   < > 8.0*   < > 8.1* 7.8* 7.5* 7.9* 8.0* 8.1*  MG 2.4  --  2.5*  --  2.6*  --   --  2.2  --  2.4  PHOS 4.2   < > 4.7*   < > 5.0* 5.4* 4.5 5.1* 6.3* 7.0*  ALBUMIN  --    < > 2.0*   < > 1.9* 1.9* 1.9* 1.9* 2.0* 1.9*   < > = values in this interval not displayed.    Liver Function Tests: Recent Labs  Lab 04/07/23 2201 04/08/23 0237 04/09/23 0802 04/09/23 1843 04/10/23 0357  AST 43*  --   --   --   --   ALT <5  --   --   --   --   ALKPHOS 86  --   --   --   --   BILITOT 0.4  --   --   --   --   PROT 6.1*  --   --   --   --   ALBUMIN 1.9*   < > 1.9* 2.0* 1.9*   < > = values in this interval not displayed.   No results for input(s): "LIPASE", "AMYLASE" in the last 168 hours. No results for input(s): "AMMONIA" in the last 168 hours. CBC: Recent Labs    04/05/23 0308 04/06/23 0355 04/07/23 0246 04/08/23 0237 04/08/23 2203  HGB 7.6* 7.6* 7.7* 6.9* 7.4*  MCV 100.0 100.9* 101.7* 100.5* 97.8    Cardiac Enzymes: No results for input(s): "CKTOTAL", "CKMB", "CKMBINDEX", "TROPONINI"  in the last 168 hours. CBG: Recent Labs  Lab 04/09/23 0707 04/09/23 1117 04/09/23 1630 04/09/23 2130 04/10/23 0615  GLUCAP 179* 228* 242* 168* 166*    Iron Studies: No results for input(s): "IRON", "TIBC", "TRANSFERRIN", "FERRITIN" in the last 72 hours. Studies/Results: DG CHEST PORT 1 VIEW Result Date: 04/08/2023 CLINICAL DATA:  Post central line placement. EXAM: PORTABLE CHEST 1 VIEW COMPARISON:  Earlier same day; chest CT-earlier same day FINDINGS: Unchanged enlarged cardiac silhouette and mediastinal contours with atherosclerotic plaque within the thoracic aorta. Interval placement left jugular approach central venous catheter with tip projected over the mid SVC. No definite left-sided pneumothorax, though evaluation degraded secondary to patient's overlying chin. Enteric tube tip below the left hemidiaphragm. Unchanged moderate-sized partially loculated right-sided effusion with associated right mid and lower lung heterogeneous/consolidative opacities. Known right-sided hydropneumothorax demonstrated on preceding chest CT is not well demonstrated on this portable examination. Unchanged trace left-sided effusion associated left basilar opacities. No evidence of edema. No acute osseous abnormalities. Atherosclerotic plaque overlies the left axilla. IMPRESSION: 1. Interval placement of left jugular approach central venous catheter with tip projected over the mid SVC. No definite pneumothorax. 2. Unchanged moderate-sized partially loculated right-sided effusion with associated right mid and lower lung heterogeneous/consolidative opacities. Known right-sided hydropneumothorax, demonstrated on preceding chest CT, is not well demonstrated on the present examination. Electronically Signed   By: Simonne Come M.D.   On: 04/08/2023 15:15   DG ESOPHAGUS W SINGLE CM (SOL OR THIN BA) Result Date: 04/08/2023 CLINICAL DATA:  Oropharyngeal dysphagia. Concern for esophageal dysmotility seen on modified barium  swallow. EXAM: ESOPHAGUS/BARIUM SWALLOW/TABLET STUDY TECHNIQUE: Single contrast examination was performed using thin liquid barium. This exam was performed by Corrin Parker, PA-C, and was supervised and interpreted by Dr. Simonne Come. FLUOROSCOPY: Radiation Exposure Index (as provided by the fluoroscopic device): 25.6 mGy Kerma COMPARISON:  Modified barium swallow done April 07, 2023 FINDINGS: Swallowing: Appears normal. No vestibular penetration or aspiration seen. Esophagus: No mucosal lesions. Apparent mild tapered narrowing of the distal esophagus potentially suggestive of a  nonocclusive stricture. Esophageal motility: Mild-to-moderate esophageal dysmotility with tertiary contractions and lack of dominant peristaltic stripping wave. Hiatal Hernia: No hiatal hernia seen. Gastroesophageal reflux: Unable to fully assess gastroesophageal reflux secondary to patient's immobility and limited study. Ingested 13 mm barium tablet: Patient was unable to swallow barium tablet. Other: Study significantly limited secondary to patient's immobility. IMPRESSION: 1. Mild-to-moderate esophageal dysmotility. 2. Mild tapered narrowing of the distal esophagus without occlusion. Further evaluation and management with endoscopy could be performed as indicated. Electronically Signed   By: Simonne Come M.D.   On: 04/08/2023 15:00   CT CHEST ABDOMEN PELVIS W CONTRAST Result Date: 04/08/2023 CLINICAL DATA:  83 year old male with COPD exacerbation, pain. History of empyema with right lung drain recently. EXAM: CT CHEST, ABDOMEN, AND PELVIS WITH CONTRAST TECHNIQUE: Multidetector CT imaging of the chest, abdomen and pelvis was performed following the standard protocol during bolus administration of intravenous contrast. RADIATION DOSE REDUCTION: This exam was performed according to the departmental dose-optimization program which includes automated exposure control, adjustment of the mA and/or kV according to patient size and/or use of  iterative reconstruction technique. CONTRAST:  75mL OMNIPAQUE IOHEXOL 350 MG/ML SOLN COMPARISON:  Portable chest this morning.  Chest CT 04/03/2023. FINDINGS: CT CHEST FINDINGS Cardiovascular: Extensive Calcified aortic atherosclerosis. Stable borderline to mild cardiomegaly. No pericardial effusion. Mediastinum/Nodes: Enteric tube within the esophagus with mild retained food or secretions proximally, but elsewhere the thoracic esophagus is decompressed. No mediastinal mass or lymphadenopathy. Lungs/Pleura: Trace layering left pleural effusion. Respiratory motion. Left lung otherwise grossly negative. Major airways are patent. Complete right lung loculated right hydropneumothorax with superimposed masslike and enhancing 6 cm right lower lobe lesion on series 3, image 39. this corresponds to the same suspicious area on the previous noncontrast CT. Adjacent pleural thickening, loculated fluid and gas (series 3, image 44). Air-fluid level with pleural thickening in the right lung apex. Occasional other gas loculations. The percutaneous drain has been removed. The pleural fluid component has increased from the previous CT, but otherwise the lung configuration is not significantly changed. Musculoskeletal: Rib motion artifact. Otherwise stable. No acute or suspicious osseous lesion identified. CT ABDOMEN PELVIS FINDINGS Hepatobiliary: Liver remains within normal limits subjacent to elevated hemidiaphragm. Gallbladder is contracted. Pancreas: Negative. Spleen: Negative. Adrenals/Urinary Tract: Negative adrenal glands. Decompressed renal collecting system, ureters, and bladder. Symmetric renal enhancement but no renal contrast excretion on the delayed images. Stomach/Bowel: Dense oral contrast intermittently in the bowel. Enteric feeding tube terminates in the proximal gastric body on series 3, image 46. The stomach is moderately distended with fluid. The duodenum is decompressed. Motion artifact elsewhere in the abdomen  and pelvis. No dilated small bowel. Cecum is on a lax mesentery. Distal large bowel diverticulosis. No bowel wall thickening, free air or free fluid identified. Vascular/Lymphatic: Aortoiliac calcified atherosclerosis. Grossly patent major arterial structures, motion artifact which also degrades evaluation of the portal venous system. No lymphadenopathy. Reproductive: Negative. Other: No pelvis free fluid. Bilateral flank and hip body wall, subcutaneous edema which is asymmetric, somewhat greater on the right. Musculoskeletal: Spine and hip degeneration. No acute or suspicious osseous lesion. IMPRESSION: 1. Recent right chest tube removed. No improvement in right lung empyema / loculated hydropneumothorax, and ongoing roughly 6 cm superimposed Mass-like area in the Right Lower Lobe which is enhancing following contrast. Right lung ventilation not significantly changed from 04/03/2023. Trace layering left pleural effusion. 2. No other acute finding in the Chest. 3. Stomach moderately distended with fluid and oral contrast. But otherwise decompressed bowel  in the abdomen and pelvis. Enteric feeding tube terminates at the proximal gastric body. 4.  Aortic Atherosclerosis (ICD10-I70.0). Electronically Signed   By: Odessa Fleming M.D.   On: 04/08/2023 11:45    Medications: Infusions:   ceFAZolin (ANCEF) IV Stopped (04/09/23 1324)   heparin 1,350 Units/hr (04/10/23 0817)   norepinephrine (LEVOPHED) Adult infusion Stopped (04/06/23 0951)    Scheduled Medications:  amiodarone  400 mg Oral BID   aspirin  81 mg Oral Daily   atorvastatin  40 mg Oral Daily   Chlorhexidine Gluconate Cloth  6 each Topical Daily   Chlorhexidine Gluconate Cloth  6 each Topical Q0600   feeding supplement  237 mL Oral TID BM   feeding supplement (PROSource TF20)  60 mL Per Tube TID   feeding supplement (VITAL 1.5 CAL)  720 mL Per Tube Q24H   folic acid  1 mg Per Tube Daily   insulin aspart  0-5 Units Subcutaneous QHS   insulin aspart   0-9 Units Subcutaneous TID WC   insulin glargine-yfgn  5 Units Subcutaneous Daily   lidocaine  1 patch Transdermal Q24H   metoCLOPramide (REGLAN) injection  10 mg Intravenous Q8H   midodrine  10 mg Oral Once in dialysis   midodrine  15 mg Oral TID WC   multivitamin  1 tablet Oral BID   nicotine  21 mg Transdermal Daily   mouth rinse  15 mL Mouth Rinse 4 times per day   sodium zirconium cyclosilicate  10 g Per Tube BID   thiamine  100 mg Per Tube Daily    have reviewed scheduled and prn medications.  Physical Exam: General:NAD, resting in bed Heart:normal rate, no rub Lungs: Coarse BS Abdomen:soft, Non-tender, non-distended Extremities:no edema at the ankles bilaterally Dialysis Access: LIJ temporary (placed 1/24)  Delane Wessinger W 04/10/2023,8:24 AM  LOS: 12 days

## 2023-04-10 NOTE — Plan of Care (Signed)
Problem: Education: Goal: Knowledge of General Education information will improve Description Including pain rating scale, medication(s)/side effects and non-pharmacologic comfort measures Outcome: Progressing   Problem: Clinical Measurements: Goal: Will remain free from infection Outcome: Progressing  Goal: Diagnostic test results will improve Outcome: Progressing Goal: Respiratory complications will improve Outcome: Progressing Goal: Cardiovascular complication will be avoided Outcome: Progressing   Problem: Activity: Goal: Risk for activity intolerance will decrease Outcome: Progressing

## 2023-04-10 NOTE — Progress Notes (Signed)
PHARMACY - ANTICOAGULATION CONSULT NOTE  Pharmacy Consult for IV heparin Indication: atrial fibrillation  Allergies  Allergen Reactions   Allopurinol     Possible DRESS syndrome   Colchicine     Possible DRESS syndrome    Patient Measurements: Height: 6\' 1"  (185.4 cm) Weight: 96.9 kg (213 lb 10 oz) IBW/kg (Calculated) : 79.9 Heparin Dosing Weight: 89 kg  Vital Signs: Temp: 97.9 F (36.6 C) (01/26 0339) Temp Source: Oral (01/26 0339) BP: 116/70 (01/26 0630) Pulse Rate: 92 (01/26 0630)  Labs: Recent Labs    04/08/23 0237 04/08/23 1541 04/08/23 2203 04/09/23 0802 04/09/23 1843 04/09/23 1950 04/10/23 0357  HGB 6.9*  --  7.4*  --   --   --   --   HCT 20.6*  --  21.8*  --   --   --   --   PLT 281  --  293  --   --   --   --   HEPARINUNFRC 0.29*  --   --  0.24*  --  0.32 0.24*  CREATININE 5.66*   < > 3.91* 4.80* 5.09*  --  5.83*   < > = values in this interval not displayed.    Estimated Creatinine Clearance: 12.2 mL/min (A) (by C-G formula based on SCr of 5.83 mg/dL (H)).   Medical History: Past Medical History:  Diagnosis Date   Acute renal failure (ARF) (HCC) 05/31/2019   B12 deficiency 08/02/2019   Bilateral carotid artery stenosis 05/31/2021   CAD (coronary artery disease) 2014   60-70% calcified LM lesion, high grade OM lesion, treated medically.   Chronic kidney disease, stage IV (severe) (HCC) 07/19/2019   Coronary artery disease involving native coronary artery of native heart without angina pectoris 07/19/2019   DM (diabetes mellitus) (HCC)    no longer on medications   Dyslipidemia 11/22/2019   Essential hypertension 07/19/2019   Gout    Gouty arthritis 09/06/2019   HTN (hypertension)    MSSA (methicillin susceptible Staphylococcus aureus) septicemia (HCC) 06/09/2012   Nonrheumatic aortic valve insufficiency 07/19/2019   OSA (obstructive sleep apnea) 04/19/2013   Dr Jerre Simon     PAF (paroxysmal atrial fibrillation) (HCC) 06/03/2019   during  admission for DRESS   Shock (HCC) 05/31/2019    Medications:  Infusions:    ceFAZolin (ANCEF) IV Stopped (04/09/23 1324)   heparin 1,200 Units/hr (04/10/23 0600)   norepinephrine (LEVOPHED) Adult infusion Stopped (04/06/23 1610)    Assessment: 83 yo male with new afib, previously on heparin this admission, then it was held for intrapleural thrombolytics. Pharmacy asked to resume.  No overt bleeding or complications noted, CBC fairly stable. CRRT heparin syringe turned off 1/19  Heparin level 0.24 on heparin at 1200 units/hr, just below goal range.  No overt bleeding or complications noted.  CBC stable.  No known issues with IV infusion.  Goal of Therapy:  Heparin level 0.3-0.7 units/ml Monitor platelets by anticoagulation protocol: Yes   Plan:  Increase IV heparin to 1350 units/hr Repeat heparin level in 8 hrs. Daily heparin level and CBC.   Reece Leader, Colon Flattery, BCCP Clinical Pharmacist  04/10/2023 7:48 AM   [facility] pharmacy phone numbers are listed on [email or web address]

## 2023-04-10 NOTE — Progress Notes (Signed)
Hyperkalemia worse Giving extra lokelma. RN d/w Nephro-- planning for HD tonight.   Steffanie Dunn, DO 04/10/23 6:38 PM [facility]  For contact information, see Amion. If no response to pager, please call PCCM consult pager. After hours, 7PM- 7AM, please call Elink.

## 2023-04-10 NOTE — Progress Notes (Signed)
   Continues to be treated for active lung and renal issues. Remains in rate controlled AF on heparin.   Appears asymptomatic from a cardiac perspective.   Would continue rate control strategy for now. Switch to DOAC when able.   We will consider attempt at TEE/DC-CV when other issues stabilized and on stable AC. Can be done prior to d/c or as an outpatient.   D/w Dr. Chestine Spore.   HF team will s/o. Please call with questions.   Arvilla Meres, MD  10:29 AM

## 2023-04-10 NOTE — Progress Notes (Addendum)
NAME:  Zachary Parrish, MRN:  [id number], DOB:  May 18, 1940, LOS: 12 ADMISSION DATE:  03/29/2023, CONSULTATION DATE:  1/14 REFERRING MD:  pRAY, CHIEF COMPLAINT:  EFFUSION    History of Present Illness:  83 year old male who presents to the emergency room with chief complaint of approximately 2 and half months of progressive exertional dyspnea.  Initially noted shortness of breath back in early November primarily with activity, around Thanksgiving time reporting fairly significant increased work of breathing to the point that family noticed, of note about 1 month prior to that certain to notice some mild increase lower extremity edema.  By Christmas time his shortness of breath had gotten to the point of resting dyspnea, he was also transition to 4 pillow orthopnea and at times would actually go out and sleep in his car to be in the upright position.  He has had productive cough with clear mucus, no chest pain, no palpitations.  No sick exposures.  His legs have become more painful over the last week, and he presents to the emergency room primarily because he can only speak in 2-3 word phrases and was significantly weak and orthopneic.  In the ER Evaluation Was initiated this demonstrated the following: Serum creatinine 2.83 is not far from his baseline, glucose 155 white blood cell count 14 troponin I of 23, a chest x-ray was obtained this demonstrated a large right pleural effusion because of this a CT of chest was obtained to further evaluate.  CT of chest demonstrated a large right pleural effusion with pleural thickening and rind, some basilar consolidated atelectasis which appeared rounded in nature, and a very small left pleural effusion he also had a 2.5 cm cystic lesion on his right thyroid.  Because of the pleural effusion and dyspnea pulmonary was asked to evaluate.  Pertinent  Medical History  Atrial fibrillation not on anticoagulation but has had prior cardioversion, carotid stenosis, CKD stage IIIb,  hyperlipidemia, coronary artery disease, type 2 diabetes, hypertension   Significant Hospital Events: Including procedures, antibiotic start and stop dates in addition to other pertinent events   1/14 admitted with progressive subacute dyspnea and large chronic appearing pleural effusion with rind on the right.  Thoracostomy tube placed in the emergency room. 1/24 tolerated iHD  Interim History / Subjective:  Tolerated HD yesterday, remains off pressors. He denies complaints.  `  Objective   Blood pressure 116/70, pulse 92, temperature 97.9 F (36.6 C), temperature source Oral, resp. rate (!) 21, height 6\' 1"  (1.854 m), weight 96.9 kg, SpO2 96%.        Intake/Output Summary (Last 24 hours) at 04/10/2023 1149 Last data filed at 04/10/2023 0600 Gross per 24 hour  Intake 1311.51 ml  Output --  Net 1311.51 ml   Filed Weights   04/08/23 1510 04/09/23 0500 04/10/23 0425  Weight: 79 kg 94.2 kg 96.9 kg    Examination: General: Chronically ill-appearing man sitting up in bed no acute distress HEENT: [place]/AT, eyes anicteric, core track Neuro: Awake, periodically does fall asleep during encounter.  Moving extremities.  Globally weak. Stoic. Chest: Breathing comfortably on nasal cannula, no rhonchi today.  Occasional wet sounding cough, stronger today.  Still has some brown sputum and suction catheter. Heart: S1-S2, irregular rhythm, regular rate Abdomen: Soft, nontender Skin: Some ecchymoses, pallor Extremities: Lower extremity edema, no significant joint erythema  Na+ 126  K+ 5.7 BUN 118 Cr 5.83 Phos 7 Sputum culture 1/23:  moderate candida albicans   Resolved Hospital Problem list  Severe sepsis with septic shock, not POA  Assessment & Plan:  Acute respiratory failure with hypoxia due to right-sided empyema and pneumonia MSSA empyema, chest tube removed on 1/21 Trapped lung; will always have some degree of effusion present there. -Continue antibiotics per ID team-back on  cefazolin - Recommend recollecting sputum culture.  Most likely Candida albicans is a colonizer rather than a pathogen. -Wean supplemental oxygen as able 2010 SpO2 greater than 90%. - Volume management with dialysis -Would only replace chest tube if significantly enlarging pleural effusion.  Will enlarge over time if he is suboptimally managed with dialysis.  Lethargy with rising WBC count, unclear source- stable -Can antibiotics per ID-cefazolin - Reflex respiratory culture - Repeat CBC tomorrow - Normal uric acid does not support diagnosis of gout  Acute RV systolic heart failure -Volume management with dialysis  Chronic A-fib -Continue amiodarone; eventually planning on cardioversion per cardiology, but since he is rate controlled this is not urgent - Continue heparin -Telemetry monitoring -Monitor electrolytes  AKI on CKD stage IV due to cardiorenal syndrome versus septic ATN, now requiring hemodialysis Hypervolemic hyponatremia Hypocalcemia Hyperkalemia today - Continue Lokelma twice daily - Renal diet - Hemodialysis today -Strict I's/O - Renally dose meds  Hypotension, concern for vasoplegia with underlying liver disease - Continue midodrine 3 times daily  Thyroid cystic lesion -Eventually needs thyroid ultrasound, can be done as an outpatient  Anemia and thrombocytopenia due to critical illness -Transfuse for hemoglobin less than 7 or hemodynamically significant bleeding  Diabetes type 2, uncontrolled on prednisone -Increase Semglee to 8 units daily today; stop when off prednisone (tomorrow) - Sliding scale insulin - Goal blood glucose 140-180  Chronic back pain with negative MRI for acute findings - Pain control, mobility as he is able to tolerate - Okay to continue Robaxin; need to be careful with renal failure if he shows signs of toxicity.  Deconditioning - Physical therapy, Occupational Therapy, progress mobility as able  Moderate protein energy  malnutrition, nausea -con't reglan TID, zofran PRN -Continue tube feeds - Continue supplemental vitamins  History of tobacco use -Continue nicotine replacement therapy - Recommend cessation  Wife updated at bedside, daughter-in-law updated.  Stable to transfer out of intensive care unit today.  Discussed with nephrology and cardiology who agree.  TRH to assume care tomorrow.   Best Practice (right click and "Reselect all SmartList Selections" daily)  Diet/type: Regular consistency + TF DVT prophylaxis: heparin  gtt GI prophylaxis: N/A Lines: HD catheter Foley:  No Code Status:  full code Last date of multidisciplinary goals of care discussion: 1/19: wife updated at bedside    Steffanie Dunn, DO 04/10/23 2:38 PM [facility]  For contact information, see Amion. If no response to pager, please call PCCM consult pager. After hours, 7PM- 7AM, please call Elink.

## 2023-04-11 ENCOUNTER — Other Ambulatory Visit (HOSPITAL_COMMUNITY): Payer: Self-pay

## 2023-04-11 ENCOUNTER — Encounter (HOSPITAL_COMMUNITY): Payer: Self-pay | Admitting: Family Medicine

## 2023-04-11 DIAGNOSIS — J869 Pyothorax without fistula: Secondary | ICD-10-CM | POA: Diagnosis not present

## 2023-04-11 LAB — RENAL FUNCTION PANEL
Albumin: 1.8 g/dL — ABNORMAL LOW (ref 3.5–5.0)
Anion gap: 9 (ref 5–15)
BUN: 68 mg/dL — ABNORMAL HIGH (ref 8–23)
CO2: 27 mmol/L (ref 22–32)
Calcium: 7.4 mg/dL — ABNORMAL LOW (ref 8.9–10.3)
Chloride: 89 mmol/L — ABNORMAL LOW (ref 98–111)
Creatinine, Ser: 3.58 mg/dL — ABNORMAL HIGH (ref 0.61–1.24)
GFR, Estimated: 16 mL/min — ABNORMAL LOW (ref >60)
Glucose, Bld: 172 mg/dL — ABNORMAL HIGH (ref 70–99)
Phosphorus: 4.7 mg/dL — ABNORMAL HIGH (ref 2.5–4.6)
Potassium: 4.1 mmol/L (ref 3.5–5.1)
Sodium: 125 mmol/L — ABNORMAL LOW (ref 135–145)

## 2023-04-11 LAB — CBC
HCT: 21.4 % — ABNORMAL LOW (ref 39.0–52.0)
Hemoglobin: 7.1 g/dL — ABNORMAL LOW (ref 13.0–17.0)
MCH: 33.3 pg (ref 26.0–34.0)
MCHC: 33.2 g/dL (ref 30.0–36.0)
MCV: 100.5 fL — ABNORMAL HIGH (ref 80.0–100.0)
Platelets: 268 10*3/uL (ref 150–400)
RBC: 2.13 MIL/uL — ABNORMAL LOW (ref 4.22–5.81)
RDW: 17.8 % — ABNORMAL HIGH (ref 11.5–15.5)
WBC: 14.7 10*3/uL — ABNORMAL HIGH (ref 4.0–10.5)
nRBC: 0.4 % — ABNORMAL HIGH (ref 0.0–0.2)

## 2023-04-11 LAB — MAGNESIUM: Magnesium: 2 mg/dL (ref 1.7–2.4)

## 2023-04-11 LAB — GLUCOSE, CAPILLARY
Glucose-Capillary: 169 mg/dL — ABNORMAL HIGH (ref 70–99)
Glucose-Capillary: 146 mg/dL — ABNORMAL HIGH (ref 70–99)
Glucose-Capillary: 170 mg/dL — ABNORMAL HIGH (ref 70–99)
Glucose-Capillary: 141 mg/dL — ABNORMAL HIGH (ref 70–99)

## 2023-04-11 LAB — HEPARIN LEVEL (UNFRACTIONATED)
Heparin Unfractionated: 0.6 [IU]/mL (ref 0.30–0.70)
Heparin Unfractionated: 0.43 [IU]/mL (ref 0.30–0.70)

## 2023-04-11 MED ORDER — GERHARDT'S BUTT CREAM
TOPICAL_CREAM | Freq: Three times a day (TID) | CUTANEOUS | Status: DC
  Administered 2023-04-14 – 2023-04-17 (×2): 1 via TOPICAL
  Filled 2023-04-11 (×3): qty 60

## 2023-04-11 MED ORDER — ACETAMINOPHEN 650 MG RE SUPP
650.0000 mg | Freq: Four times a day (QID) | RECTAL | Status: DC | PRN
  Administered 2023-04-16: 650 mg via RECTAL
  Filled 2023-04-11: qty 1

## 2023-04-11 MED ORDER — VITAL 1.5 CAL PO LIQD: 1000.0000 mL | ORAL | Status: DC

## 2023-04-11 MED ORDER — GUAIFENESIN-DM 100-10 MG/5ML PO SYRP
5.0000 mL | ORAL_SOLUTION | ORAL | Status: DC | PRN
  Administered 2023-04-12: 5 mL
  Filled 2023-04-11: qty 5

## 2023-04-11 MED ORDER — MIDODRINE HCL 5 MG PO TABS
15.0000 mg | ORAL_TABLET | Freq: Three times a day (TID) | ORAL | Status: DC
Start: 2023-04-11 — End: 2023-04-16
  Administered 2023-04-11 – 2023-04-16 (×13): 15 mg
  Filled 2023-04-11 (×12): qty 3

## 2023-04-11 MED ORDER — ENSURE ENLIVE PO LIQD: 237.0000 mL | Freq: Three times a day (TID) | ORAL | Status: DC

## 2023-04-11 MED ORDER — CEFAZOLIN SODIUM-DEXTROSE 1-4 GM/50ML-% IV SOLN
1.0000 g | INTRAVENOUS | Status: DC
  Administered 2023-04-11 – 2023-04-13 (×3): 1 g via INTRAVENOUS
  Filled 2023-04-11 (×3): qty 50

## 2023-04-11 MED ORDER — OXYCODONE HCL 5 MG PO TABS
5.0000 mg | ORAL_TABLET | ORAL | Status: DC | PRN
Start: 2023-04-11 — End: 2023-04-14
  Administered 2023-04-12 (×2): 10 mg
  Filled 2023-04-11 (×2): qty 2

## 2023-04-11 MED ORDER — METHOCARBAMOL 500 MG PO TABS
500.0000 mg | ORAL_TABLET | Freq: Three times a day (TID) | ORAL | Status: DC | PRN
  Administered 2023-04-12: 500 mg
  Filled 2023-04-11: qty 1

## 2023-04-11 MED ORDER — AMIODARONE HCL 200 MG PO TABS
400.0000 mg | ORAL_TABLET | Freq: Two times a day (BID) | ORAL | Status: DC
Start: 2023-04-11 — End: 2023-04-16
  Administered 2023-04-11 – 2023-04-16 (×10): 400 mg
  Filled 2023-04-11 (×10): qty 2

## 2023-04-11 MED ORDER — ACETAMINOPHEN 325 MG PO TABS: 650.0000 mg | ORAL_TABLET | Freq: Four times a day (QID) | ORAL | Status: DC | PRN

## 2023-04-11 MED ORDER — VITAL 1.5 CAL PO LIQD
1000.0000 mL | ORAL | Status: DC
  Administered 2023-04-11 – 2023-04-15 (×3): 1000 mL
  Filled 2023-04-11 (×6): qty 1000

## 2023-04-11 MED ORDER — ASPIRIN 81 MG PO CHEW
81.0000 mg | CHEWABLE_TABLET | Freq: Every day | ORAL | Status: DC
  Administered 2023-04-12 – 2023-04-16 (×5): 81 mg
  Filled 2023-04-11 (×5): qty 1

## 2023-04-11 MED ORDER — FOOD THICKENER (SIMPLYTHICK)
10.0000 | Freq: Once | ORAL | Status: AC
  Administered 2023-04-11: 10 via ORAL

## 2023-04-11 MED ORDER — ATORVASTATIN CALCIUM 40 MG PO TABS
40.0000 mg | ORAL_TABLET | Freq: Every day | ORAL | Status: DC
  Administered 2023-04-12 – 2023-04-16 (×5): 40 mg
  Filled 2023-04-11 (×5): qty 1

## 2023-04-11 NOTE — Assessment & Plan Note (Signed)
Presented initially in hypoxic respiratory failure secondary to right pleural effusion. CT obtained with large R pleural effusion with pleural thickening and rind, thoracostomy tube was placed with exudative pleural fluid with MSSA empyema. Initially received Ceftriaxone and Azithro (1/14), switched to CTX and Linezolid, now on Cefazolin. Bcx collected after abx started Ngx5d. MRSA PCR negative. MRI L-spine negative for osteomyelitis.  Pt has remote hx MSSA bacteremia >10 y ago. Sputum culture collected with candida albicans, although suspected to be colonized. WBC had been trending upward but improved this morning.  - ID following, appreciate recommendations - repeat sputum culture - Wean O2 as able, goal >90% - Continue Cefazolin (ID recommends 3 week course of Cefadroxil at discharge) - am CBC - Robitussin q4h PRN for cough - Atarax 10mg  TID PRN for anxiety - Oxycodone 5-10mg  q4h PRN moderate-severe pain

## 2023-04-11 NOTE — Assessment & Plan Note (Deleted)
TSH 3.279 on admission. CT chest w/o contrast revealed 2.4 cm cystic lesion in the right thyroid lobe.  - CTM

## 2023-04-11 NOTE — Assessment & Plan Note (Addendum)
Echo demonstrated LVEF 50-55%, moderate LVH, RV mildly enlarged, LA and RA mildly dilated, and trivial MVR.  Received IV Lasix 80mg  initially in the ED. On lasix 40mg  daily at home. - Ted hose - Daily weights - Strict I/Os - Volume management per iHD

## 2023-04-11 NOTE — Progress Notes (Signed)
[facility]  Date of Admission:  03/29/2023      Total days of antibiotics 14d     ASSESSMENT: Zachary Parrish is a 83 y.o. male admitted with:   MSSA Bacteremia -  Empyema - CXR unchanged moderate-sized partially loculated effusion on right and lower lung opacities. CT scan w/o any improvement in empyema/loculated hydropneumothorax after CT removal with mass like area in RLL enhanced after contrast.  PCCM team repeating sputum for him today. WBC count down trended. There was some concern for aspiration component and received 48h of broader coverage for consideration of this. Will follow culture. Agree that candida isolated previously more likely a colonizer. Sepsis physiology resolved.  -Will follow fever curve and wbc counts  -FU micro from sputum.  -Otherwise continue cefazolin while inpatient. Incompletely drained empyema would probably need longer course 6 weeks. Fluid management with HD will also confound FU on imaging.  (EOT 6 wk after removal is March 4 - can use oral renally dosed cefadroxil after improvement in PO intake and overall clinical recovery.    A/C CKD-  iHD Required During Current Stay -  Abx doses to reflect ESRD   Deconditioned -  Very deconditioned prior to this long and critical hospitalization. Will be a long road for him. He has orders to transfer out of ICU and following with hospitalitis. Suspect he will need longer term placement in outpatient rehab facility.    PLAN: Follow sputum cx  Follow fever/wbc curves  Continue cefazolin (will D/W Dr. Elinor Parkinson to lengthen out tx based on minimal effect CT had on Empyema management)   Principal Problem:   Empyema of right pleural space (HCC) Active Problems:   Septic shock (HCC)   Type 2 diabetes mellitus with complication, without long-term current use of insulin (HCC)   Acute diastolic heart failure (HCC)   Thyroid lesion   Pleural effusion   Anemia of chronic  disease   Alcohol abuse   CAP (community acquired pneumonia)   Hypotension   AKI (acute kidney injury) (HCC)   Acute respiratory failure with hypoxia (HCC)   Staph aureus infection   Need for management of chest tube   Atrial fibrillation with RVR (HCC)   Oliguria   Abnormality of gait due to impairment of balance   Decreased mobility and endurance   Malnutrition of moderate degree    amiodarone  400 mg Oral BID   aspirin  81 mg Oral Daily   atorvastatin  40 mg Oral Daily   Chlorhexidine Gluconate Cloth  6 each Topical Daily   Chlorhexidine Gluconate Cloth  6 each Topical Q0600   feeding supplement  237 mL Oral TID BM   feeding supplement (PROSource TF20)  60 mL Per Tube TID   feeding supplement (VITAL 1.5 CAL)  720 mL Per Tube Q24H   folic acid  1 mg Per Tube Daily   food thickener  10 packet Oral Once   insulin aspart  0-5 Units Subcutaneous QHS   insulin aspart  0-9 Units Subcutaneous TID WC   lidocaine  1 patch Transdermal Q24H   metoCLOPramide (REGLAN) injection  10 mg Intravenous Q8H   midodrine  15 mg Oral TID WC   multivitamin  1 tablet Oral BID   nicotine  21 mg Transdermal Daily   mouth rinse  15 mL Mouth Rinse 4 times per day   sodium zirconium cyclosilicate  10 g Per Tube BID   thiamine  100 mg Per Tube Daily    SUBJECTIVE: Wife at the bedside. He is not saying much today while in the room. Intermittently sleeping. Confused but no worse today.   Review of Systems: ROS  Allergies  Allergen Reactions   Allopurinol     Possible DRESS syndrome   Colchicine     Possible DRESS syndrome    OBJECTIVE: Vitals:   04/11/23 0900 04/11/23 1000 04/11/23 1100 04/11/23 1200  BP: (!) 119/57 127/61 (!) 117/51 (!) 124/53  Pulse: (!) 110 (!) 113 (!) 102 (!) 111  Resp: (!) 24 18 (!) 21 18  Temp:   99.5 F (37.5 C)   TempSrc:   Axillary   SpO2: 98% 98% 99% 96%  Weight:      Height:       Body mass index is 27.69 kg/m.  Physical Exam Constitutional:       General: He is not in acute distress.    Appearance: He is ill-appearing.  Cardiovascular:     Rate and Rhythm: Normal rate.  Pulmonary:     Effort: Pulmonary effort is normal. No tachypnea or accessory muscle usage.     Breath sounds: Decreased breath sounds present.  Skin:    General: Skin is warm and dry.  Neurological:     Mental Status: He is alert. He is disoriented.     Motor: Weakness present.     Lab Results Lab Results  Component Value Date   WBC 14.7 (H) 04/11/2023   HGB 7.1 (L) 04/11/2023   HCT 21.4 (L) 04/11/2023   MCV 100.5 (H) 04/11/2023   PLT 268 04/11/2023    Lab Results  Component Value Date   CREATININE 3.58 (H) 04/11/2023   BUN 68 (H) 04/11/2023   NA 125 (L) 04/11/2023   K 4.1 04/11/2023   CL 89 (L) 04/11/2023   CO2 27 04/11/2023    Lab Results  Component Value Date   ALT <5 04/07/2023   AST 43 (H) 04/07/2023   ALKPHOS 86 04/07/2023   BILITOT 0.4 04/07/2023     Microbiology: Recent Results (from the past 240 hours)  MRSA Next Gen by PCR, Nasal     Status: None   Collection Time: 04/07/23 10:24 AM   Specimen: Nasal Mucosa; Nasal Swab  Result Value Ref Range Status   MRSA by PCR Next Gen NOT DETECTED NOT DETECTED Final    Comment: (NOTE) The GeneXpert MRSA Assay (FDA approved for NASAL specimens only), is one component of a comprehensive MRSA colonization surveillance program. It is not intended to diagnose MRSA infection nor to guide or monitor treatment for MRSA infections. Test performance is not FDA approved in patients less than 74 years old. Performed at [facility], [address], [place], [place] [address]   Expectorated Sputum Assessment w Gram Stain, Rflx to Resp Cult     Status: None   Collection Time: 04/07/23 12:01 PM   Specimen: Sputum  Result Value Ref Range Status   Specimen Description SPU  Final   Special Requests NONE  Final   Sputum evaluation   Final    THIS SPECIMEN IS ACCEPTABLE FOR SPUTUM  CULTURE Performed at [facility], [address] [place], [place] [address]    Report Status 04/07/2023 FINAL  Final  Culture, Respiratory w Gram Stain     Status: None   Collection Time: 04/07/23 12:01 PM   Specimen: Sputum  Result Value Ref Range Status   Specimen Description SPU  Final   Special Requests NONE Reflexed from [id number]  Final   Gram Stain   Final    ABUNDANT WBC PRESENT, PREDOMINANTLY PMN FEW YEAST WITH PSEUDOHYPHAE Performed at [facility], [address], [place], [place] [address]    Culture MODERATE CANDIDA ALBICANS  Final   Report Status 04/09/2023 FINAL  Final  Expectorated Sputum Assessment w Gram Stain, Rflx to Resp Cult     Status: None   Collection Time: 04/10/23 11:42 AM   Specimen: Sputum  Result Value Ref Range Status   Specimen Description SPUTUM  Final   Special Requests NONE  Final   Sputum evaluation   Final    Sputum specimen not acceptable for testing.  Please recollect.   Results Called to: Adria Devon RN 04/10/23 @ 2312 BY AB Performed at [facility], [address], [place], [place] [address]    Report Status 04/10/2023 FINAL  Final     Rexene Alberts, MSN, NP-C [facility]  [email or web address] Pager: [phone] Office: [phone] [facility] Main Line: [phone] *Secure Chat Communication Welcome

## 2023-04-11 NOTE — Plan of Care (Signed)
Patient transferred to [facility] overnight from CVICU. Hall fall risk measures maintained. Trialysis cath to [facility]. Patient remains on continuous infusion of heparin. The patient answers most questions appropriately yet meets NuDESC criteria +1.   Edit: 0530 hours: Call from lab received by this RN for critical value: HGB = 6.5. Dr. Fatima Blank paged at [phone] @ 0535 hours with critical lab. Call placed to pharmacy at this time as well to discuss recs for heparin infusion. At 0540 hours, the same page sent to Dr. Jena Gauss at [phone]. 0545 hrs: Call-back received from [facility] and new orders are noted.   Problem: Education: Goal: Knowledge of General Education information will improve Description: Including pain rating scale, medication(s)/side effects and non-pharmacologic comfort measures Outcome: Progressing   Problem: Health Behavior/Discharge Planning: Goal: Ability to manage health-related needs will improve Outcome: Progressing   Problem: Clinical Measurements: Goal: Ability to maintain clinical measurements within normal limits will improve Outcome: Progressing Goal: Will remain free from infection Outcome: Progressing Goal: Diagnostic test results will improve Outcome: Progressing Goal: Respiratory complications will improve Outcome: Progressing Goal: Cardiovascular complication will be avoided Outcome: Progressing   Problem: Activity: Goal: Risk for activity intolerance will decrease Outcome: Progressing   Problem: Nutrition: Goal: Adequate nutrition will be maintained Outcome: Progressing   Problem: Coping: Goal: Level of anxiety will decrease Outcome: Progressing   Problem: Elimination: Goal: Will not experience complications related to bowel motility Outcome: Progressing Goal: Will not experience complications related to urinary retention Outcome: Progressing   Problem: Pain Management: Goal: General experience of comfort will improve Outcome: Progressing    Problem: Safety: Goal: Ability to remain free from injury will improve Outcome: Progressing   Problem: Skin Integrity: Goal: Risk for impaired skin integrity will decrease Outcome: Progressing   Problem: Education: Goal: Ability to describe self-care measures that may prevent or decrease complications (Diabetes Survival Skills Education) will improve Outcome: Progressing Goal: Individualized Educational Video(s) Outcome: Progressing   Problem: Coping: Goal: Ability to adjust to condition or change in health will improve Outcome: Progressing   Problem: Fluid Volume: Goal: Ability to maintain a balanced intake and output will improve Outcome: Progressing   Problem: Health Behavior/Discharge Planning: Goal: Ability to identify and utilize available resources and services will improve Outcome: Progressing Goal: Ability to manage health-related needs will improve Outcome: Progressing   Problem: Metabolic: Goal: Ability to maintain appropriate glucose levels will improve Outcome: Progressing   Problem: Nutritional: Goal: Maintenance of adequate nutrition will improve Outcome: Progressing Goal: Progress toward achieving an optimal weight will improve Outcome: Progressing   Problem: Skin Integrity: Goal: Risk for impaired skin integrity will decrease Outcome: Progressing   Problem: Tissue Perfusion: Goal: Adequacy of tissue perfusion will improve Outcome: Progressing   Problem: Education: Goal: Knowledge of disease and its progression will improve Outcome: Progressing Goal: Individualized Educational Video(s) Outcome: Progressing   Problem: Health Behavior/Discharge Planning: Goal: Ability to manage health-related needs will improve Outcome: Progressing   Problem: Nutritional: Goal: Ability to make healthy dietary choices will improve Outcome: Progressing   Problem: Clinical Measurements: Goal: Complications related to the disease process, condition or treatment  will be avoided or minimized Outcome: Progressing

## 2023-04-11 NOTE — Progress Notes (Signed)
Patient ID: Grabiel Schmutz, male   DOB: 07-Oct-1940, 83 y.o.   MRN: [id number] S:Had HD late last night/early this am.  No new complaints. O:BP (!) 119/57   Pulse (!) 110   Temp 97.8 F (36.6 C) (Axillary)   Resp (!) 24   Ht 6\' 1"  (1.854 m)   Wt 95.2 kg   SpO2 98%   BMI 27.69 kg/m   Intake/Output Summary (Last 24 hours) at 04/11/2023 0945 Last data filed at 04/11/2023 0900 Gross per 24 hour  Intake 1268.55 ml  Output 2000 ml  Net -731.45 ml   Intake/Output: I/O last 3 completed shifts: In: 2288.5 [I.V.:494.5; NG/GT:1744; IV Piggyback:50] Out: 2000 [Other:2000]  Intake/Output this shift:  Total I/O In: 30.1 [I.V.:30.1] Out: -  Weight change: -1.7 kg Gen: NAD, chronically ill-appearing CVS: tachy at 110 Resp: scattered rhonchi Abd: +BS, soft, NT/ND Ext:  2+ pitting edema  Recent Labs  Lab 04/07/23 2201 04/08/23 0237 04/08/23 1541 04/08/23 2203 04/09/23 0802 04/09/23 1843 04/10/23 0357 04/10/23 1600 04/11/23 0410  NA 123*   < > 124* 125* 127* 126* 126* 124* 125*  K 5.8*   < > 5.6* 4.9 5.1 5.5* 5.7* 6.1* 4.1  CL 89*   < > 89* 92* 90* 88* 87* 85* 89*  CO2 22   < > 21* 23 25 26 24 23 27   GLUCOSE 190*   < > 184* 240* 209* 208* 145* 258* 172*  BUN 115*   < > 122* 70* 87* 103* 118* 135* 68*  CREATININE 5.65*   < > 6.09* 3.91* 4.80* 5.09* 5.83* 6.34* 3.58*  ALBUMIN 1.9*   < > 1.9* 1.9* 1.9* 2.0* 1.9* 1.9* 1.8*  CALCIUM 7.9*   < > 7.8* 7.5* 7.9* 8.0* 8.1* 7.9* 7.4*  PHOS  --    < > 5.4* 4.5 5.1* 6.3* 7.0* 7.2* 4.7*  AST 43*  --   --   --   --   --   --   --   --   ALT <5  --   --   --   --   --   --   --   --    < > = values in this interval not displayed.   Liver Function Tests: Recent Labs  Lab 04/07/23 2201 04/08/23 0237 04/10/23 0357 04/10/23 1600 04/11/23 0410  AST 43*  --   --   --   --   ALT <5  --   --   --   --   ALKPHOS 86  --   --   --   --   BILITOT 0.4  --   --   --   --   PROT 6.1*  --   --   --   --   ALBUMIN 1.9*   < > 1.9* 1.9* 1.8*   < > =  values in this interval not displayed.   No results for input(s): "LIPASE", "AMYLASE" in the last 168 hours. No results for input(s): "AMMONIA" in the last 168 hours. CBC: Recent Labs  Lab 04/06/23 0355 04/07/23 0246 04/08/23 0237 04/08/23 2203 04/11/23 0410  WBC 19.3* 24.4* 27.3* 24.1* 14.7*  HGB 7.6* 7.7* 6.9* 7.4* 7.1*  HCT 23.4* 24.0* 20.6* 21.8* 21.4*  MCV 100.9* 101.7* 100.5* 97.8 100.5*  PLT 247 281 281 293 268   Cardiac Enzymes: No results for input(s): "CKTOTAL", "CKMB", "CKMBINDEX", "TROPONINI" in the last 168 hours. CBG: Recent Labs  Lab 04/10/23 0615 04/10/23 1140 04/10/23  1631 04/10/23 2137 04/11/23 0758  GLUCAP 166* 211* 221* 180* 169*    Iron Studies: No results for input(s): "IRON", "TIBC", "TRANSFERRIN", "FERRITIN" in the last 72 hours. Studies/Results: No results found.  amiodarone  400 mg Oral BID   aspirin  81 mg Oral Daily   atorvastatin  40 mg Oral Daily   Chlorhexidine Gluconate Cloth  6 each Topical Daily   Chlorhexidine Gluconate Cloth  6 each Topical Q0600   feeding supplement  237 mL Oral TID BM   feeding supplement (PROSource TF20)  60 mL Per Tube TID   feeding supplement (VITAL 1.5 CAL)  720 mL Per Tube Q24H   folic acid  1 mg Per Tube Daily   insulin aspart  0-5 Units Subcutaneous QHS   insulin aspart  0-9 Units Subcutaneous TID WC   lidocaine  1 patch Transdermal Q24H   metoCLOPramide (REGLAN) injection  10 mg Intravenous Q8H   midodrine  15 mg Oral TID WC   multivitamin  1 tablet Oral BID   nicotine  21 mg Transdermal Daily   mouth rinse  15 mL Mouth Rinse 4 times per day   sodium zirconium cyclosilicate  10 g Per Tube BID   thiamine  100 mg Per Tube Daily    BMET    Component Value Date/Time   NA 125 (L) 04/11/2023 0410   K 4.1 04/11/2023 0410   CL 89 (L) 04/11/2023 0410   CO2 27 04/11/2023 0410   GLUCOSE 172 (H) 04/11/2023 0410   BUN 68 (H) 04/11/2023 0410   CREATININE 3.58 (H) 04/11/2023 0410   CALCIUM 7.4 (L)  04/11/2023 0410   GFRNONAA 16 (L) 04/11/2023 0410   GFRAA 30 (L) 06/07/2019 0321   CBC    Component Value Date/Time   WBC 14.7 (H) 04/11/2023 0410   RBC 2.13 (L) 04/11/2023 0410   HGB 7.1 (L) 04/11/2023 0410   HCT 21.4 (L) 04/11/2023 0410   PLT 268 04/11/2023 0410   MCV 100.5 (H) 04/11/2023 0410   MCH 33.3 04/11/2023 0410   MCHC 33.2 04/11/2023 0410   RDW 17.8 (H) 04/11/2023 0410   LYMPHSABS 0.4 (L) 03/30/2023 0251   MONOABS 1.1 (H) 03/30/2023 0251   EOSABS 0.0 03/30/2023 0251   BASOSABS 0.0 03/30/2023 0251   Pt is a 83 y.o. yo male  with PMH significant for hypertension, type II DM, CAD, A-fib, CKD (f/b Dr. Glenna Fellows) was admitted with shortness of breath, seen as a consultation for the evaluation and management of acute kidney injury.   Assessment/Plan:  AKI/CKD stage IV - ischemic ATN in setting of septic shock.  [place] without obstruction.  Refractory to diuretics and hypotension, started on CRRT 1/15-1/21/25 after temp HD catheter placed by PCCM.  Off of CRRT and had IHD last night/early this am due to refractory hyperkalemia and anuria.  Will continue to follow for signs of renal recovery.  Will plan for HD again on Wed and keep on MWF schedule for now.  Septic shock due to staph  bacteremia - per PCCM Right pleural effusion - s/p chest tube Acute RV failre with CHF -  UF as tolerated with HD.   Chronic atrial fibrillation - possible DCCV when more stable. Anemia - transfuse for Hgb <7  Irena Cords, MD [facility] [phone]

## 2023-04-11 NOTE — Assessment & Plan Note (Signed)
No meds at home. Controlled in the 100s here.  - CBGs 4 times daily, before meals and at bedtime - sensitive SSI with HS coverage

## 2023-04-11 NOTE — Assessment & Plan Note (Addendum)
Rate controlled <110 currently. Asymptomatic. - continue Amiodarone - continue Heparin - transition to DOAC when able - Cardiac monitoring - Cardiology signed off but considering TEE/DCCV when stabilized/on stable AC (prior to d/c or outpatient)

## 2023-04-11 NOTE — TOC Progression Note (Signed)
Transition of Care [facility]) - Progression Note    Patient Details  Name: Zachary Parrish MRN: [id number] Date of Birth: 1941/01/05  [facility]) CM/SW Contact  Delilah Shan, LCSWA Phone Number: 04/11/2023, 4:31 PM  Clinical Narrative:     CSW following to fax out for SNF closer to patient being medically ready for dc. CSW will continue to follow.  Expected Discharge Plan: Skilled Nursing Facility Barriers to Discharge: Continued Medical Work up  Expected Discharge Plan and Services In-house Referral: Clinical Social Work Discharge Planning Services: CM Consult Post Acute Care Choice: Home Health Living arrangements for the past 2 months: Single Family Home                 DME Arranged: N/A DME Agency: NA       HH Arranged: NA           Social Determinants of Health (SDOH) Interventions SDOH Screenings   Food Insecurity: No Food Insecurity (03/30/2023)  Housing: Low Risk  (03/30/2023)  Transportation Needs: No Transportation Needs (03/30/2023)  Utilities: Not At Risk (03/30/2023)  Financial Resource Strain: Low Risk  (01/24/2023)   Received from [facility]  Physical Activity: Insufficiently Active (01/24/2023)   Received from [facility]  Social Connections: Socially Integrated (03/30/2023)  Stress: No Stress Concern Present (01/24/2023)   Received from [facility]  Tobacco Use: High Risk (03/29/2023)    Readmission Risk Interventions     No data to display

## 2023-04-11 NOTE — Assessment & Plan Note (Signed)
Likely 2/2 deconditioning in addition to prolonged hospital stay and illness. - PT/OT eval/treat

## 2023-04-11 NOTE — Assessment & Plan Note (Signed)
>>  ASSESSMENT AND PLAN FOR ACUTE RESPIRATORY FAILURE WITH HYPOXIA (HCC) WRITTEN ON 04/11/2023  8:41 AM BY BRONSON, MARTIN, DO  Increased work of breathing, on 2L LFNC saturating at 100%, was on RA during his Echo this AM.  S/p IV Lasix 80 mg x 1.  Chest tube placed by PCCM to help improve WOB.  Echo demonstrated LVEF 50-55%, moderate LVH, RV mildly enlarged, LA and RA mildly dilated, and trivial MVR.  - Pulmonology consulted, appreciate recommendations - PT/OT to treat - Supplemental O2 - Re-dose IV Lasix 80 mg

## 2023-04-11 NOTE — Progress Notes (Addendum)
PHARMACY - ANTICOAGULATION CONSULT NOTE  Pharmacy Consult for IV heparin Indication: atrial fibrillation  Allergies  Allergen Reactions   Allopurinol     Possible DRESS syndrome   Colchicine     Possible DRESS syndrome    Patient Measurements: Height: 6\' 1"  (185.4 cm) Weight: 95.2 kg (209 lb 14.1 oz) IBW/kg (Calculated) : 79.9 Heparin Dosing Weight: 89 kg  Vital Signs: Temp: 97.8 F (36.6 C) (01/27 0759) Temp Source: Axillary (01/27 0759) BP: 110/56 (01/27 0700) Pulse Rate: 119 (01/27 0700)  Labs: Recent Labs    04/08/23 2203 04/09/23 0802 04/10/23 0357 04/10/23 1600 04/11/23 0410  HGB 7.4*  --   --   --  7.1*  HCT 21.8*  --   --   --  21.4*  PLT 293  --   --   --  268  HEPARINUNFRC  --    < > 0.24* 0.25* 0.60  CREATININE 3.91*   < > 5.83* 6.34* 3.58*   < > = values in this interval not displayed.    Estimated Creatinine Clearance: 18.3 mL/min (A) (by C-G formula based on SCr of 3.58 mg/dL (H)).  Assessment: 83 yo male with new afib, previously on heparin this admission, then it was held for intrapleural thrombolytics. Pharmacy asked to resume.  No overt bleeding or complications noted, CBC fairly stable. CRRT heparin syringe turned off 1/19.  -Heparin level 0.6, therapeutic at 1500 units/hr. -No bleeding noted. Hgb low stable 7s, platelets are normal.   Goal of Therapy:  Heparin level 0.3-0.7 units/ml Monitor platelets by anticoagulation protocol: Yes   Plan:  Continue IV heparin at 1500 units/hr 6h confirmatory heparin level Daily heparin level and CBC F/u transition to DOAC when appropriate   Thank you for involving pharmacy in this patient's care.  Loura Back, PharmD, BCPS Clinical Pharmacist Clinical phone for 04/11/2023 is [phone] 04/11/2023 8:28 AM   Addendum: Confirmatory heparin level is therapeutic at 0.43 on 1500 units/hr.  Continue heparin drip at 1500 units/hr F/u labs in am  [facility], PharmD, BCPS 10:59 AM

## 2023-04-11 NOTE — Assessment & Plan Note (Deleted)
Hx of alcohol use daily with vodka.  - CIWAs without atiivan - PT/INR - RUQ [place] to assess for cirrhosis - Folate - MV - Thiamine

## 2023-04-11 NOTE — Assessment & Plan Note (Addendum)
Previously on Coreg 25 mg BID and Telmisartan 20 mg daily for HTN at home however has been hypotensive throughout admission, requiring ICU for Levophed drip. BP now stable and has been off pressors x2 days - Continue Midodrine 15mg  TID - Ted hose

## 2023-04-11 NOTE — Assessment & Plan Note (Deleted)
Increased work of breathing, on 2L LFNC saturating at 100%, was on RA during his Echo this AM.  S/p IV Lasix 80 mg x 1.  Chest tube placed by PCCM to help improve WOB.  Echo demonstrated LVEF 50-55%, moderate LVH, RV mildly enlarged, LA and RA mildly dilated, and trivial MVR.  - Pulmonology consulted, appreciate recommendations - PT/OT to treat - Supplemental O2 - Re-dose IV Lasix 80 mg

## 2023-04-11 NOTE — Assessment & Plan Note (Addendum)
Cortrak in place, TF initiated 1/17. TF rate decreased to 20 ml/hr by Provider (not in order in [facility]) to see if appetite will improve 1/21. On 1/22, Episode of emesis after eating greasy sausage; abd xray with Cortrak tip in stomach, unremarkable bowel gas pattern. On 1/23, dysphagia 3/thin diet recommended by SLP - SLP following - Zofran 4mg  q6h PRN nausea/vomiting - Reglan 10mg  q8h

## 2023-04-11 NOTE — Progress Notes (Signed)
Daily Progress Note Intern Pager: [phone]  Patient name: Zachary Parrish Medical record number: [id number] Date of birth: Feb 17, 1941 Age: 83 y.o. Gender: male  Primary Care Provider: Verl Bangs, MD Consultants: PCCM (signed off), Cardiology (signed off), Nephrology, ID Code Status: Full  Pt Overview and Major Events to Date:  1/14 - Admitted to FMTS for acute hypoxic respiratory failure 2/2 R pulmonary effusion, CCM placed chest tube, cards consulted for uncontrolled afib 1/15 - Anuric (ARF 2/2 ischemic ATN in setting of septic shock), hypotensive, transferred to ICU for pressors. ID consulted and added Linezolid and Ceftriaxone for MSSA empyema. CRRT started. 1/16 - Linezolid/Ceftriaxone d/c'd, Cefazolin started. Cortrak placed. 1/21 - CRRT stopped, Lokelma started for hyperkalemia.  1/23 - sputum culture moderate candida albicans, thought to be colonized 1/24 - Remained anuric/hyperkalemic, HD initiated again. Came off pressors. 1/27 - Transferred back to FMTS  Assessment and Plan:  83yo PMHx Afib s/p DCCV, carotid stenosis, CKD stage 3b, HLD, CAD, HTN, T2DM who presented initially with shortness of breath, found to have large right pulmonary effusion and initially in afib with RVR.  Assessment & Plan Empyema of right pleural space (HCC) Presented initially in hypoxic respiratory failure secondary to right pleural effusion. CT obtained with large R pleural effusion with pleural thickening and rind, thoracostomy tube was placed with exudative pleural fluid with MSSA empyema. Initially received Ceftriaxone and Azithro (1/14), switched to CTX and Linezolid, now on Cefazolin. Bcx collected after abx started Ngx5d. MRSA PCR negative. MRI L-spine negative for osteomyelitis.  Pt has remote hx MSSA bacteremia >10 y ago. Sputum culture collected with candida albicans, although suspected to be colonized. WBC had been trending upward but improved this morning.  - ID following,  appreciate recommendations - repeat sputum culture - Wean O2 as able, goal >90% - Continue Cefazolin (ID recommends 3 week course of Cefadroxil at discharge) - am CBC - Robitussin q4h PRN for cough - Atarax 10mg  TID PRN for anxiety - Oxycodone 5-10mg  q4h PRN moderate-severe pain Atrial fibrillation with RVR (HCC) Rate controlled <110 currently. Asymptomatic. - continue Amiodarone - continue Heparin - transition to DOAC when able - Cardiac monitoring - Cardiology signed off but considering TEE/DCCV when stabilized/on stable AC (prior to d/c or outpatient) Hypotension Previously on Coreg 25 mg BID and Telmisartan 20 mg daily for HTN at home however has been hypotensive throughout admission, requiring ICU for Levophed drip. BP now stable and has been off pressors x2 days - Continue Midodrine 15mg  TID - Ted hose AKI (acute kidney injury) (HCC) Ischemic ATN in setting of septic shock. US obtained with no sign of obstruction. Has had refractory hyperkalemia throughout admission. Received IHD last night due to refractory hyperkalemia and anuria (CRRT 1/15-1/21). Nephrology planning for dialysis MWF (next on Wed) - Nephrology following, appreciate recs - continue Lokelma 10g BID - renal diet - Strict I/O - daily weights - am RFP, Mag Acute diastolic heart failure (HCC) Echo demonstrated LVEF 50-55%, moderate LVH, RV mildly enlarged, LA and RA mildly dilated, and trivial MVR.  Received IV Lasix 80mg  initially in the ED. On lasix 40mg  daily at home. - Ted hose - Daily weights - Strict I/Os - Volume management per iHD Anemia of chronic disease Stable this morning at 7.1. Likely in the setting of CKD. Receives ESA outpatient if Hgb <10.  - am CBC - transfuse if <7 Type 2 diabetes mellitus with complication, without long-term current use of insulin (HCC) No meds at home. Controlled in the  100s here.  - CBGs 4 times daily, before meals and at bedtime - sensitive SSI with HS  coverage Decreased mobility and endurance Likely 2/2 deconditioning in addition to prolonged hospital stay and illness. - PT/OT eval/treat Malnutrition of moderate degree Cortrak in place, TF initiated 1/17. TF rate decreased to 20 ml/hr by Provider (not in order in [facility]) to see if appetite will improve 1/21. On 1/22, Episode of emesis after eating greasy sausage; abd xray with Cortrak tip in stomach, unremarkable bowel gas pattern. On 1/23, dysphagia 3/thin diet recommended by SLP - SLP following - Zofran 4mg  q6h PRN nausea/vomiting - Reglan 10mg  q8h  Chronic and Stable Problems:  Chronic back pain - continue Robaxin 500mg  q8h PRN, caution for signs of toxicity 2/2 renal failure Alcohol use - RUQ US unremarkable. Continue folate, MVI, thiamine.  Thyroid lesion - CT chest w/o contrast with 2.4cm cystic lesion in R thyroid lobe, TSH 3.279 on admission HLD - continue lipitor 40mg  Tobacco use - continue 21mg  nicotine patch   FEN/GI: TF via Cortrak, Ensure, Dysphagia 1 diet PPx: Heparin Dispo: likely SNF pending clinical improvement   Subjective:  No complaints from pt this morning. Wife expresses concern regarding his clinical status, does not feel like he has been progressing in the right direction. DIL concerned regarding change from ICU status.   Objective: Temp:  [97.9 F (36.6 C)-98.7 F (37.1 C)] 98 F (36.7 C) (01/27 0400) Pulse Rate:  [90-148] 119 (01/27 0700) Resp:  [7-33] 19 (01/27 0700) BP: (79-141)/(39-104) 110/56 (01/27 0700) SpO2:  [79 %-100 %] 98 % (01/27 0700) Weight:  [95.2 kg] 95.2 kg (01/27 0500) Physical Exam: General: chronically ill appearing, no acute distress. Cortrak in place.  Cardiovascular: Tachycardic, irregularly irregular Respiratory: Breathing comfortably on 4L HFNC. No rhonchi or wheezing Abdomen: Soft, non-distended Extremities: Mild edema BLE, compression stockings in place   Laboratory: Most recent CBC Lab Results  Component Value Date    WBC 14.7 (H) 04/11/2023   HGB 7.1 (L) 04/11/2023   HCT 21.4 (L) 04/11/2023   MCV 100.5 (H) 04/11/2023   PLT 268 04/11/2023   Most recent BMP    Latest Ref Rng & Units 04/11/2023    4:10 AM  BMP  Glucose 70 - 99 mg/dL 027   BUN 8 - 23 mg/dL 68   Creatinine 2.53 - 1.24 mg/dL 6.64   Sodium 403 - 474 mmol/L 125   Potassium 3.5 - 5.1 mmol/L 4.1   Chloride 98 - 111 mmol/L 89   CO2 22 - 32 mmol/L 27   Calcium 8.9 - 10.3 mg/dL 7.4      Anela Bensman, DO 04/11/2023, 7:09 AM  PGY-1, [facility] FPTS Intern pager: [phone], text pages welcome Secure chat group [facility] Teaching Service

## 2023-04-11 NOTE — Assessment & Plan Note (Addendum)
Ischemic ATN in setting of septic shock. US obtained with no sign of obstruction. Has had refractory hyperkalemia throughout admission. Received IHD last night due to refractory hyperkalemia and anuria (CRRT 1/15-1/21). Nephrology planning for dialysis MWF (next on Wed) - Nephrology following, appreciate recs - continue Lokelma 10g BID - renal diet - Strict I/O - daily weights - am RFP, Mag

## 2023-04-11 NOTE — Assessment & Plan Note (Deleted)
CT chest demonstrated large R pleural effusion with pleural thickening and very small left pleural effusion. S/p IV Lasix 80 mg and chest tube placement for drainage. ~1500 cc of dark red fluid drained from R chest tube overnight. Fluid culture grew S. aureus empyema, unclear if this began as PNA but he has hx of staph bacteremia so concern for possible recurrent bacteremia.  - Repeat CT scan when pleural effusion has been adequately evacuated (high risk for trapped lung w/ radiographic appearance).  - Due to chronicity of presentation this is suspected to be an exudative process, may require lytics - s/p ceftriaxone and azithromycin (1/14) and switched to CTX and Linezolid (1/15-), de-escalate based on sensitivities - Daily weights - Strict I/Os - Pulmonology consulted, appreciate recommendations

## 2023-04-11 NOTE — Progress Notes (Signed)
Nutrition Follow-up  DOCUMENTATION CODES:   Non-severe (moderate) malnutrition in context of acute illness/injury  INTERVENTION:   Tube Feeding via Cortrak: transition back to full feedings Vital 1.5 at 50 ml/hr Pro-source TF20 60 mL TID TF at goal provide 2040 kcals, 141 g of protein and 912 mL of free water  Continue Ensure Enlive po BID as if able to tolerate, each supplement provides 350 kcal and 20 grams of protein.  PO diet as tolerated  Continue Renal MVI NUTRITION DIAGNOSIS:   Moderate Malnutrition related to acute illness as evidenced by moderate muscle depletion, mild fat depletion, energy intake < 75% for > 7 days.  Being addressed via TF   GOAL:   Patient will meet greater than or equal to 90% of their needs  Progressing   MONITOR:   Supplement acceptance, PO intake  REASON FOR ASSESSMENT:   Consult Assessment of nutrition requirement/status  ASSESSMENT:   Pt presented to ED w/ SOB and found to have AKI d/t ischemic ATN r/t septic shock. PMH: HTN, T2DM, CAD, A-fib. Has large R pleural effusion and small L pleural effusion. Chst tube in place and culture grew staph aureus. CRRT also initiated.  1/14 Admitted 1/15 CRRT initiated 1/17 Cortrak placed, TF initiated 1/21 CRRT discontinued, TF rate decreased to 20 ml/hr by Provider (not in order in [facility]) to see if appetite will improve 1/22 Episode of emesis after eating greasy sausage; abd xray with Cortrak tip in stomach, unremarkable bowel gas pattern 1/23 SLP eval, Dysphagia 3/Thins recommended 1/24 iHD x 3 hours with 2L net UF 1/26 iHD overnight, lokelma for hyperkalemia  Pt had iHD overnight with net UF 2L; hyperkalemic with BUN >135, Creatinine 6.34 and phosphorus 7.2 prior to iHD. Remains anuric. HFNC 4L  SLP in room working with pt on RD visit. Pt drank some Ensure and attempted to eat a piece of bread with SLP. Pt pocketing/holding liquids and food in mouth; noted some Ensure did come out of pt's  mouth as well. Pt chewed and chewed and chewed the small piece of bread but later suctioned out by SLP with the residual Ensure that was still in patient mouth. Diet downgraded to Dysphagia 1 by SLP; based on current observation, pt not likely to be able to meet nutritional needs by mouth at this time.   Pt is very weak and deconditioned; requires assistance with feeding  Tolerating nocturnal TF of Vital 1.5 at 60 ml/hr via Cortrak. Pt has been on trickle TF at 20 or nocturnal TF since 1/22 with no improvement in oral intake. Pt eat bites of meals. Recommend increasing TF back to goal rate today  Noted new stage 2 pressure injury to sacrum  Labs: sodium 125 (L), potassium 4.1 (wdl), BUN 68, Creatinine .58 Meds: ss novolog, reglan, midodrine, rena-vite, ss novolog, folic acid, thiamine, lokelma BID   Diet Order:   Diet Order             Diet NPO time specified  Diet effective now                   EDUCATION NEEDS:   Education needs have been addressed  Skin:  Skin Assessment: Reviewed RN Assessment  Last BM:  1/27 type 6 large  Height:   Ht Readings from Last 1 Encounters:  03/29/23 6\' 1"  (1.854 m)    Weight:   Wt Readings from Last 1 Encounters:  04/11/23 95.2 kg    Ideal Body Weight:  83.6 kg  BMI:  Body mass index is 27.69 kg/m.  Estimated Nutritional Needs:   Kcal:  2100-2300kcal  Protein:  130-140g  Fluid:  >2L/day   Romelle Starcher MS, RDN, LDN, CNSC Registered Dietitian 3 Clinical Nutrition RD Inpatient Contact Info in Amion

## 2023-04-11 NOTE — Progress Notes (Signed)
Speech Language Pathology Treatment: Dysphagia  Patient Details Name: Zachary Parrish MRN: [id number] DOB: [birth year] Today's Date: 04/11/2023 Time: 8295-6213 SLP Time Calculation (min) (ACUTE ONLY): 27 min  Assessment / Plan / Recommendation Clinical Impression  Zachary Parrish presented with deterioration in mental status today such that he could not consistently follow commands to tuck his chin.  He required total assist for feeding with notable oral holding, repetitive mastication of mechanical solids requiring suctioning/manual removal. He continues to cough intermittently with eating/drinking, not necessarily correlated with aspiration per MBS. Regardless, he is not able to effectively eat current diet. PO intake has been very limited per RD. Recommend downgrading diet to dysphagia 1, thicken liquids to nectar since he can't follow through with chin tuck. Discussed with Mrs. Lafontaine at the bedside.  New sign hung at [facility] indicating changes. D/W RN. SLP will follow.   HPI HPI: Zachary Parrish is an 83 year old gentleman admitted 1/14 presenting with greater than 2 months of progressive shortness of breath and leg edema. Over the last 2 weeks he has felt worse such that he is short of breath even at rest  He has been sleeping sitting upright sometimes in his car to prop him up enough. Pt also with afib. Pt with chest tube placed 1/14 due to pleural effusion. Pt  found to have AKI d/t ischemic ATN r/t septic shock with cultures growing staph aureus at chest tube 1/16. Pt transferred to ICU and CRRT initiated 1/16. PMH: A-fib many years ago, status post ablation and not recurrent so not on anticoagulation.      SLP Plan  Continue with current plan of care      Recommendations for follow up therapy are one component of a multi-disciplinary discharge planning process, led by the attending physician.  Recommendations may be updated based on patient status, additional functional criteria and insurance authorization.     Recommendations  Diet recommendations: Dysphagia 1 (puree);Nectar-thick liquid Liquids provided via: Cup;Straw Medication Administration: Crushed with puree Supervision: Full supervision/cueing for compensatory strategies Compensations: Slow rate;Small sips/bites;Follow solids with liquid Postural Changes and/or Swallow Maneuvers: Seated upright 90 degrees;Upright 30-60 min after meal                  Oral care BID     Dysphagia, oropharyngeal phase (R13.12)     Continue with current plan of care    Willy Pinkerton L. Samson Frederic, MA CCC/SLP Clinical Specialist - Acute Care SLP Acute Rehabilitation Services Office number [phone]  [name]  04/11/2023, 11:54 AM

## 2023-04-11 NOTE — TOC Benefit Eligibility Note (Signed)
Pharmacy Patient Advocate Encounter  Insurance verification completed.    The patient is insured through [facility]. Patient has Medicare and is not eligible for a copay card, but may be able to apply for patient assistance or Medicare RX Payment Plan (Patient Must reach out to their plan, if eligible for payment plan), if available.    Ran test claim for Eliquis 5mg  and the current 30 day co-pay is $45.  Ran test claim for Xarelto 20mg  and the current 30 day co-pay is $45.   This test claim was processed through [employer]- copay amounts may vary at other pharmacies due to [employer], or as the patient moves through the different stages of their insurance plan.

## 2023-04-11 NOTE — Assessment & Plan Note (Addendum)
Stable this morning at 7.1. Likely in the setting of CKD. Receives ESA outpatient if Hgb <10.  - am CBC - transfuse if <7

## 2023-04-12 DIAGNOSIS — J869 Pyothorax without fistula: Secondary | ICD-10-CM | POA: Diagnosis not present

## 2023-04-12 LAB — CBC
HCT: 20 % — ABNORMAL LOW (ref 39.0–52.0)
Hemoglobin: 6.5 g/dL — CL (ref 13.0–17.0)
MCH: 32.2 pg (ref 26.0–34.0)
MCHC: 32.5 g/dL (ref 30.0–36.0)
MCV: 99 fL (ref 80.0–100.0)
Platelets: 230 10*3/uL (ref 150–400)
RBC: 2.02 MIL/uL — ABNORMAL LOW (ref 4.22–5.81)
RDW: 17.9 % — ABNORMAL HIGH (ref 11.5–15.5)
WBC: 11.5 10*3/uL — ABNORMAL HIGH (ref 4.0–10.5)
nRBC: 0.3 % — ABNORMAL HIGH (ref 0.0–0.2)

## 2023-04-12 LAB — RENAL FUNCTION PANEL
Albumin: 1.8 g/dL — ABNORMAL LOW (ref 3.5–5.0)
Anion gap: 13 (ref 5–15)
BUN: 118 mg/dL — ABNORMAL HIGH (ref 8–23)
CO2: 26 mmol/L (ref 22–32)
Calcium: 7.7 mg/dL — ABNORMAL LOW (ref 8.9–10.3)
Chloride: 87 mmol/L — ABNORMAL LOW (ref 98–111)
Creatinine, Ser: 5.27 mg/dL — ABNORMAL HIGH (ref 0.61–1.24)
GFR, Estimated: 10 mL/min — ABNORMAL LOW (ref >60)
Glucose, Bld: 224 mg/dL — ABNORMAL HIGH (ref 70–99)
Phosphorus: 4.5 mg/dL (ref 2.5–4.6)
Potassium: 4.6 mmol/L (ref 3.5–5.1)
Sodium: 126 mmol/L — ABNORMAL LOW (ref 135–145)

## 2023-04-12 LAB — MAGNESIUM: Magnesium: 2.1 mg/dL (ref 1.7–2.4)

## 2023-04-12 LAB — GLUCOSE, CAPILLARY
Glucose-Capillary: 215 mg/dL — ABNORMAL HIGH (ref 70–99)
Glucose-Capillary: 165 mg/dL — ABNORMAL HIGH (ref 70–99)
Glucose-Capillary: 147 mg/dL — ABNORMAL HIGH (ref 70–99)
Glucose-Capillary: 148 mg/dL — ABNORMAL HIGH (ref 70–99)

## 2023-04-12 LAB — HEMOGLOBIN AND HEMATOCRIT, BLOOD
HCT: 23.3 % — ABNORMAL LOW (ref 39.0–52.0)
Hemoglobin: 8.1 g/dL — ABNORMAL LOW (ref 13.0–17.0)

## 2023-04-12 LAB — PREPARE RBC (CROSSMATCH)

## 2023-04-12 LAB — HEPARIN LEVEL (UNFRACTIONATED): Heparin Unfractionated: 0.25 [IU]/mL — ABNORMAL LOW (ref 0.30–0.70)

## 2023-04-12 MED ORDER — HEPARIN (PORCINE) 25000 UT/250ML-% IV SOLN
2100.0000 [IU]/h | INTRAVENOUS | Status: DC
  Administered 2023-04-12 – 2023-04-13 (×2): 1500 [IU]/h via INTRAVENOUS
  Administered 2023-04-13: 1600 [IU]/h via INTRAVENOUS
  Administered 2023-04-14: 1750 [IU]/h via INTRAVENOUS
  Administered 2023-04-15: 2100 [IU]/h via INTRAVENOUS
  Filled 2023-04-12 (×5): qty 250

## 2023-04-12 MED ORDER — METOCLOPRAMIDE HCL 5 MG/ML IJ SOLN
5.0000 mg | Freq: Three times a day (TID) | INTRAMUSCULAR | Status: DC
  Administered 2023-04-12 – 2023-04-15 (×9): 5 mg via INTRAVENOUS
  Filled 2023-04-12 (×9): qty 2

## 2023-04-12 MED ORDER — SODIUM CHLORIDE 0.9% IV SOLUTION: Freq: Once | INTRAVENOUS | Status: DC

## 2023-04-12 NOTE — Assessment & Plan Note (Addendum)
No meds at home. Controlled in the 100s here.  - CBGs 4 times daily, before meals and at bedtime - sensitive SSI with HS coverage

## 2023-04-12 NOTE — Assessment & Plan Note (Addendum)
Likely 2/2 deconditioning in addition to prolonged hospital stay and illness. - PT/OT eval/treat

## 2023-04-12 NOTE — Assessment & Plan Note (Addendum)
Rate controlled <110 currently. Asymptomatic. - continue Amiodarone - continue Heparin - transition to DOAC when able - need to see when nephrology is planning permanent access - Cardiac monitoring - Cardiology signed off but considering TEE/DCCV when stabilized/on stable AC (prior to d/c or outpatient)

## 2023-04-12 NOTE — Progress Notes (Signed)
Nutrition Follow-up  DOCUMENTATION CODES:   Non-severe (moderate) malnutrition in context of acute illness/injury  INTERVENTION:   Tube Feeding via Cortrak:  Vital 1.5 at 50 ml/hr Decrease Pros-Source TF20 60 mL to BID TF provides 1960 kcals, 121 g of protein and 912 mL of free water  D/C Ensure until po diet resumed  Continue Renal MVI, Thiamine, Folic Acid per tube  NUTRITION DIAGNOSIS:   Moderate Malnutrition related to acute illness as evidenced by moderate muscle depletion, mild fat depletion, energy intake < 75% for > 7 days.  Being addressed via TF   GOAL:   Patient will meet greater than or equal to 90% of their needs  Met via TF   MONITOR:   Supplement acceptance, PO intake  REASON FOR ASSESSMENT:   Consult Assessment of nutrition requirement/status  ASSESSMENT:   Pt presented to ED w/ SOB and found to have AKI d/t ischemic ATN r/t septic shock. PMH: HTN, T2DM, CAD, A-fib. Has large R pleural effusion and small L pleural effusion. Chst tube in place and culture grew staph aureus. CRRT also initiated.  1/15 CRRT initiated 1/17 Cortrak placed, TF initiated 1/21 CRRT discontinued, TF rate decreased to 20 ml/hr by Provider (not in order in [facility]) to see if appetite will improve 1/22 Episode of emesis after eating greasy sausage; abd xray with Cortrak tip in stomach, unremarkable bowel gas pattern 1/23 SLP eval, Dysphagia 3/Thins recommended; TF changed to nocturnal  1/24 iHD x 3 hours with 2L net UF 1/26 iHD overnight, lokelma for hyperkalemia 1/28 SLP downgraded to Dysphagia 1 in AM, later changed to NPO due to pt pocketing food, retaining liquids in mouth. Full goal TF resumed  Noted next iHD planned for tomorrow  Noted pt made NPO later yesterday until SLP re-evaluated after pt holding Ensure in mouth and not swallowing. SLP evaluated this AM and pt to remain NPO for now. Pt also noted to be retaining secretions  Tolerating TF via Cortrak, Vital 1.5 at 50  ml/hr with Pro-Source TF20 60 mL TID  Noted plan to reduce reglan today with goal to d/c if able  Potassium wdl with lokelma BID; phosphorus 4.5 (wdL). Remains hyponatremic; currently on concentrated TF (less free water) and no extra free waterr flushes  Labs: potassium 4.6 (wdl), sodium 126 (L), BUN 454, Creatinine 5.27 Meds: midodrine, reglan, rena-vite, thiamine, folic acid, ss novolog, lokelma BID   Diet Order:   Diet Order             Diet NPO time specified Except for: Ice Chips  Diet effective now                   EDUCATION NEEDS:   Education needs have been addressed  Skin:  Skin Assessment: Skin Integrity Issues: Skin Integrity Issues:: Stage II Stage II: sacrum  Last BM:  1/27 type 6 large  Height:   Ht Readings from Last 1 Encounters:  03/29/23 6\' 1"  (1.854 m)    Weight:   Wt Readings from Last 1 Encounters:  04/12/23 94.7 kg    Ideal Body Weight:  83.6 kg  BMI:  Body mass index is 27.54 kg/m.  Estimated Nutritional Needs:   Kcal:  2100-2300kcal  Protein:  115-130 g  Fluid:  1L plus UOP    Romelle Starcher MS, RDN, LDN, CNSC Registered Dietitian 3 Clinical Nutrition RD Inpatient Contact Info in Amion

## 2023-04-12 NOTE — Assessment & Plan Note (Addendum)
Previously on Coreg 25 mg BID and Telmisartan 20 mg daily for HTN at home however has been hypotensive throughout admission, requiring ICU for Levophed drip. BP now stable and has been off pressors. - Continue Midodrine 15mg  TID - Ted hose

## 2023-04-12 NOTE — Assessment & Plan Note (Addendum)
Hemoglobin 6.5 this morning, 1U PRBC ordered. Likely in the setting of CKD. Receives ESA outpatient if Hgb <10.  - post transfusion H/H - am CBC - transfuse if <7

## 2023-04-12 NOTE — Progress Notes (Signed)
Speech Language Pathology Treatment: Dysphagia  Patient Details Name: Zachary Parrish MRN: [id number] DOB: May 23, 1940 Today's Date: 04/12/2023 Time: 5621-3086 SLP Time Calculation (min) (ACUTE ONLY): 10 min  Assessment / Plan / Recommendation Clinical Impression  Zachary Parrish was lethargic again today.  He was retaining secretions orally; not likely swallowing with necessary frequency due to his mental status.  Oral suctioning removed retained mucus. Trial teaspoons of water generated wet/congested cough.  He was able to attend to ice cubes for adequate mastication/swallowing, but his mental status won't allow safe resumption of POs today. Spoke with his wife and Dr. Pollie Meyer.  Recommend continuing NPO with cortrak feeds for now.  Please offer ice chips every few hours .  The purpose of ice for this pt is not only comfort, but to keep pharynx healthy and moist. Pooled secretions in an NPO pt can thicken and create harmful persistent secretions that pt can't mobilize. These can even form mucus plugs. A few ice chips, given after oral care, can loosen secretions for pt to clear and maintain airway and pharyngeal hygiene. Also can preserve some swallow function. Minimal ice is not expected to be harmful to patient even if some moisture is aspirated.   SLP will follow.   HPI HPI: Zachary Parrish is an 83 year old gentleman admitted 1/14 presenting with greater than 2 months of progressive shortness of breath and leg edema. Over the last 2 weeks he has felt worse such that he is short of breath even at rest  He has been sleeping sitting upright sometimes in his car to prop him up enough. Pt also with afib. Pt with chest tube placed 1/14 due to pleural effusion. Pt  found to have AKI d/t ischemic ATN r/t septic shock with cultures growing staph aureus at chest tube 1/16. Pt transferred to ICU and CRRT initiated 1/16. MBS 1/23: mild dysphagia, aspiration of thin liquids prevented with chin tuck and small sips. Esophagram  1/24: mild-mod esophageal dysmotility; mild tapered narrowing of distal esophagus, recs for endoscopy per imaging note.  PMH: A-fib many years ago, status post ablation and not recurrent so not on anticoagulation.      SLP Plan  Continue with current plan of care      Recommendations for follow up therapy are one component of a multi-disciplinary discharge planning process, led by the attending physician.  Recommendations may be updated based on patient status, additional functional criteria and insurance authorization.    Recommendations  Diet recommendations: NPO Medication Administration: Via alternative means                  Oral care QID;Oral care prior to ice chip/H20     Dysphagia, oropharyngeal phase (R13.12)     Continue with current plan of care    Ceil Roderick L. Samson Frederic, MA CCC/SLP Clinical Specialist - Acute Care SLP Acute Rehabilitation Services Office number [phone]  [name]  04/12/2023, 10:29 AM

## 2023-04-12 NOTE — Progress Notes (Signed)
Physical Therapy Treatment Patient Details Name: Zachary Parrish MRN: [id number] DOB: 01/15/1941 Today's Date: 04/12/2023   History of Present Illness Zachary Parrish is an 83 year old gentleman admitted 1/14 presenting with greater than 2 months of progressive shortness of breath and leg edema. Over the last 2 weeks he has felt worse such that he is short of breath even at rest  He has been sleeping sitting upright sometimes in his car to prop him up enough. Pt also with afib. Pt with chest tube placed 1/14 due to pleural effusion. Pt  found to have AKI d/t ischemic ATN r/t septic shock with cultures growing staph aureus at chest tube 1/16. Pt transferred to ICU and CRRT initiated 1/16. PMH: A-fib many years ago, status post ablation and not recurrent so not on anticoagulation, CKD 3B    PT Comments  Treatment limited by pt lethargy, had received pain meds for R knee discomfort. Pt also receiving blood. Pt needed max stimulation to maintain wakefulness. Max A +2 needed to come to sitting EOB from chair position. Pt did verbalize a few times to family in sitting and answered several questions appropriately. Max A +2 needed to maintain sitting due to posterior lean. SPO2 100% , HR low 90's to low 100's throughout, and BP 129/74 end of session. Patient will benefit from continued inpatient follow up therapy, <3 hours/day. PT will continue to follow.     If plan is discharge home, recommend the following: Assistance with cooking/housework;Assist for transportation;Help with stairs or ramp for entrance;Two people to help with walking and/or transfers;Two people to help with bathing/dressing/bathroom   Can travel by private vehicle     No  Equipment Recommendations  None recommended by PT    Recommendations for Other Services       Precautions / Restrictions Precautions Precautions: Fall;Other (comment) Precaution Comments: watch BP and O2 Restrictions Weight Bearing Restrictions Per Provider Order: No      Mobility  Bed Mobility Overal bed mobility: Needs Assistance Bed Mobility: Supine to Sit, Sit to Supine     Supine to sit: Max assist, +2 for physical assistance Sit to supine: Max assist, +2 for physical assistance   General bed mobility comments: pt bed first placed in chair for increased arousal and BP tolerance. BP 140/73. Made small change in pt alertness. Pt then pivoted to EOB with max A +2. Pt more awake in this position and verbalized a few times.    Transfers         Stand pivot transfers:  (RN to manage CRRT lines)         General transfer comment: unable to attempt due to lethargy    Ambulation/Gait Ambulation/Gait assistance:  (3rd person pushed chair behind pt)             General Gait Details: unable   Stairs             Wheelchair Mobility     Tilt Bed    Modified Rankin (Stroke Patients Only)       Balance Overall balance assessment: Needs assistance Sitting-balance support: Bilateral upper extremity supported, Feet supported Sitting balance-Leahy Scale: Poor Sitting balance - Comments: max A +2 due to posterior lean which was more pronounced with increased time in sitting Postural control: Posterior lean                                  Cognition Arousal: Lethargic Behavior  During Therapy: WFL for tasks assessed/performed Overall Cognitive Status: Impaired/Different from baseline Area of Impairment: Safety/judgement, Problem solving, Awareness                   Current Attention Level: Focused Memory: Decreased short-term memory Following Commands:  (not following commands due to lethargy) Safety/Judgement: Decreased awareness of safety Awareness: Emergent Problem Solving: Requires verbal cues General Comments: limited by lethargy        Exercises General Exercises - Lower Extremity Long Arc Quad: AAROM, Left, 10 reps, Seated    General Comments General comments (skin integrity, edema,  etc.): BP end of session 129/74, SPO2 100%, HR 90's to low 100's A fib.      Pertinent Vitals/Pain Pain Assessment Pain Assessment: Faces Faces Pain Scale: Hurts a little bit Facial Expression: Grimacing Body Movements: Absence of movements Muscle Tension: Relaxed Compliance with ventilator (intubated pts.): N/A Vocalization (extubated pts.): Talking in normal tone or no sound CPOT Total: 2 Pain Location: grimaces with flexion of R knee Pain Descriptors / Indicators: Grimacing, Sore Pain Intervention(s): Limited activity within patient's tolerance, Monitored during session, Premedicated before session    Home Living                          Prior Function            PT Goals (current goals can now be found in the care plan section) Acute Rehab PT Goals Patient Stated Goal: to get stronger and be able to breathe PT Goal Formulation: With patient Time For Goal Achievement: 04/13/23 Potential to Achieve Goals: Fair Progress towards PT goals: Not progressing toward goals - comment (lethargy)    Frequency    Min 1X/week      PT Plan      Co-evaluation              AM-PAC PT "6 Clicks" Mobility   Outcome Measure  Help needed turning from your back to your side while in a flat bed without using bedrails?: Total Help needed moving from lying on your back to sitting on the side of a flat bed without using bedrails?: Total Help needed moving to and from a bed to a chair (including a wheelchair)?: Total Help needed standing up from a chair using your arms (e.g., wheelchair or bedside chair)?: Total Help needed to walk in hospital room?: Total Help needed climbing 3-5 steps with a railing? : Total 6 Click Score: 6    End of Session Equipment Utilized During Treatment: Oxygen Activity Tolerance: Patient limited by lethargy Patient left: with call bell/phone within reach;with family/visitor present;in bed;with bed alarm set Nurse Communication: Mobility  status PT Visit Diagnosis: Unsteadiness on feet (R26.81);Muscle weakness (generalized) (M62.81);Pain Pain - Right/Left: Right Pain - part of body: Knee (right abdomen)     Time: 1135-1203 PT Time Calculation (min) (ACUTE ONLY): 28 min  Charges:    $Therapeutic Activity: 23-37 mins PT General Charges $$ ACUTE PT VISIT: 1 Visit                     Lyanne Co, PT  Acute Rehab Services Secure chat preferred Office [phone]    Lawana Chambers Zachary Parrish 04/12/2023, 1:55 PM

## 2023-04-12 NOTE — Progress Notes (Signed)
Daily Progress Note Intern Pager: [phone]  Patient name: Zachary Parrish Medical record number: [id number] Date of birth: [birth year] Age: 83 y.o. Gender: male  Primary Care Provider: Verl Bangs, MD Consultants: PCCM (signed off), Cardiology (signed off), Nephrology, ID (signed off) Code Status: Full  Pt Overview and Major Events to Date:  1/14 - Admitted to FMTS for acute hypoxic respiratory failure 2/2 R pulmonary effusion, CCM placed chest tube, cards consulted for uncontrolled afib 1/15 - Anuric (ARF 2/2 ischemic ATN in setting of septic shock), hypotensive, transferred to ICU for pressors. ID consulted and added Linezolid and Ceftriaxone for MSSA empyema. CRRT started. 1/16 - Linezolid/Ceftriaxone d/c'd, Cefazolin started. Cortrak placed. 1/21 - CRRT stopped, Lokelma started for hyperkalemia.  1/23 - sputum culture moderate candida albicans, thought to be colonized 1/24 - Remained anuric/hyperkalemic, HD initiated again. Came off pressors. 1/27 - Transferred back to FMTS  Assessment and Plan:  82yo PMHx Afib s/p DCCV, carotid stenosis, CKD stage 3b, HLD, CAD, HTN, T2DM who presented initially with shortness of breath, found to have large right pulmonary effusion and initially in afib with RVR. Awaiting post transfusion H/H Assessment & Plan Empyema of right pleural space (HCC) Presented initially in hypoxic respiratory failure secondary to right pleural effusion. CT obtained with large R pleural effusion with pleural thickening and rind, thoracostomy tube was placed with exudative pleural fluid with MSSA empyema. Initially received Ceftriaxone and Azithro (1/14), switched to CTX and Linezolid, now on Cefazolin. Bcx collected after abx started Ngx5d. MRSA PCR negative. MRI L-spine negative for osteomyelitis.  Pt has remote hx MSSA bacteremia >10 y ago. Sputum culture collected with candida albicans, although suspected to be colonized. WBC had been trending upward but now  improving. Saturating well on 3LNC this morning. - ID following, appreciate recommendations - repeat sputum culture ordered 1/27 - Wean O2 as able, goal >90% - Continue Cefazolin (Discharge: if on iHD - cefazolin with HD x4-6 weeks from chest tube removal through 3/4, if not on iHD: cefadroxil renally dosed) - am CBC - Robitussin q4h PRN for cough - Atarax 10mg  TID PRN for anxiety - Oxycodone 5-10mg  q4h PRN moderate-severe pain Atrial fibrillation with RVR (HCC) Rate controlled <110 currently. Asymptomatic. - continue Amiodarone - continue Heparin - transition to DOAC when able - need to see when nephrology is planning permanent access - Cardiac monitoring - Cardiology signed off but considering TEE/DCCV when stabilized/on stable AC (prior to d/c or outpatient) Hypotension Previously on Coreg 25 mg BID and Telmisartan 20 mg daily for HTN at home however has been hypotensive throughout admission, requiring ICU for Levophed drip. BP now stable and has been off pressors - Continue Midodrine 15mg  TID - Ted hose AKI (acute kidney injury) (HCC) Ischemic ATN in setting of septic shock. US obtained with no sign of obstruction. Has had refractory hyperkalemia throughout admission. Received IHD last night due to refractory hyperkalemia and anuria (CRRT 1/15-1/21). Nephrology planning for dialysis MWF (next on Wed) - Nephrology following, appreciate recs - continue Lokelma 10g BID - renal diet - Strict I/O - daily weights - am RFP, Mag Acute diastolic heart failure (HCC) Echo demonstrated LVEF 50-55%, moderate LVH, RV mildly enlarged, LA and RA mildly dilated, and trivial MVR.  Received IV Lasix 80mg  initially in the ED. On lasix 40mg  daily at home. - Ted hose - Daily weights - Strict I/Os - Volume management per iHD Anemia of chronic disease Hemoglobin 6.5 this morning, 1U PRBC ordered. Likely in the setting of  CKD. Receives ESA outpatient if Hgb <10.  - post transfusion H/H - am CBC -  transfuse if <7 Type 2 diabetes mellitus with complication, without long-term current use of insulin (HCC) No meds at home. Controlled in the 100s here.  - CBGs 4 times daily, before meals and at bedtime - sensitive SSI with HS coverage Decreased mobility and endurance Likely 2/2 deconditioning in addition to prolonged hospital stay and illness. - PT/OT eval/treat Malnutrition of moderate degree Cortrak in place, TF initiated 1/17. TF rate decreased to 20 ml/hr by Provider (not in order in [facility]) to see if appetite will improve 1/21. On 1/22, Episode of emesis after eating greasy sausage; abd xray with Cortrak tip in stomach, unremarkable bowel gas pattern. On 1/23, dysphagia 3/thin diet recommended by SLP. Made NPO 1/27 due to increased weakness with chewing foods.  - SLP following - Zofran 4mg  q6h PRN nausea/vomiting - Decrease Reglan to 5mg  q8h (goal to discontinue)  Chronic and Stable Problems:  Chronic back pain - continue Robaxin 500mg  q8h PRN, caution for signs of toxicity 2/2 renal failure Alcohol use - RUQ US unremarkable. Continue folate, MVI, thiamine. Did require phenobarbital taper during admission, now outside withdrawal window.  Gout: concern for gout during admission but uric acid normal, got prednisone 20mg  x3 days (ended 1/27) Thyroid lesion - CT chest w/o contrast with 2.4cm cystic lesion in R thyroid lobe, TSH 3.279 on admission HLD - continue lipitor 40mg  Tobacco use - continue 21mg  nicotine patch   FEN/GI: TF via Cortrak, Ensure, Dysphagia 1 diet  PPx: heparin gtt Dispo:SNF pending clinical improvement   Subjective:  NAEON. No complaints this morning  Objective: Temp:  [97.6 F (36.4 C)-99.5 F (37.5 C)] 98.4 F (36.9 C) (01/28 0755) Pulse Rate:  [73-113] 100 (01/28 0755) Resp:  [16-24] 20 (01/28 0755) BP: (97-139)/(51-87) 109/64 (01/28 0755) SpO2:  [83 %-100 %] 100 % (01/28 0755) Weight:  [94.7 kg] 94.7 kg (01/28 0500) Physical Exam: General: chronically  ill appearing, no acute distress. Cortrak in place.  Cardiovascular: Regular rate, irregularly irregular Respiratory: Breathing comfortably on 3L HFNC. No rhonchi or wheezing Abdomen: Soft, non-distended Extremities: Mild edema BLE, compression stockings in place   Laboratory: Most recent CBC Lab Results  Component Value Date   WBC 11.5 (H) 04/12/2023   HGB 6.5 (LL) 04/12/2023   HCT 20.0 (L) 04/12/2023   MCV 99.0 04/12/2023   PLT 230 04/12/2023   Most recent BMP    Latest Ref Rng & Units 04/12/2023    5:06 AM  BMP  Glucose 70 - 99 mg/dL 086   BUN 8 - 23 mg/dL 578   Creatinine 4.69 - 1.24 mg/dL 6.29   Sodium 528 - 413 mmol/L 126   Potassium 3.5 - 5.1 mmol/L 4.6   Chloride 98 - 111 mmol/L 87   CO2 22 - 32 mmol/L 26   Calcium 8.9 - 10.3 mg/dL 7.7     Cristen Bredeson, DO 04/12/2023, 7:59 AM  PGY-1, [facility] FPTS Intern pager: [phone], text pages welcome Secure chat group [facility] Teaching Service

## 2023-04-12 NOTE — Assessment & Plan Note (Addendum)
Presented initially in hypoxic respiratory failure secondary to right pleural effusion. CT obtained with large R pleural effusion with pleural thickening and rind, thoracostomy tube was placed with exudative pleural fluid with MSSA empyema. Initially received Ceftriaxone and Azithro (1/14), switched to CTX and Linezolid, now on Cefazolin. Bcx collected after abx started Ngx5d. MRSA PCR negative. MRI L-spine negative for osteomyelitis.  Pt has remote hx MSSA bacteremia >10 y ago. Sputum culture collected with candida albicans, although suspected to be colonized. WBC had been trending upward but now improving. Saturating well on 3LNC this morning. - ID following, appreciate recommendations - repeat sputum culture ordered 1/27 - Wean O2 as able, goal >90% - Continue Cefazolin (Discharge: if on iHD - cefazolin with HD x4-6 weeks from chest tube removal through 3/4, if not on iHD: cefadroxil renally dosed) - am CBC - Robitussin q4h PRN for cough - Atarax 10mg  TID PRN for anxiety - Oxycodone 5-10mg  q4h PRN moderate-severe pain

## 2023-04-12 NOTE — Progress Notes (Signed)
Patient ID: Zachary Parrish, male   DOB: [birth year], 83 y.o.   MRN: [id number] S: lethargic this am. O:BP (!) 133/95   Pulse (!) 112   Temp 98.8 F (37.1 C) (Axillary)   Resp 20   Ht 6\' 1"  (1.854 m)   Wt 94.7 kg   SpO2 99%   BMI 27.54 kg/m   Intake/Output Summary (Last 24 hours) at 04/12/2023 1142 Last data filed at 04/12/2023 1100 Gross per 24 hour  Intake 1619.15 ml  Output 0 ml  Net 1619.15 ml   Intake/Output: I/O last 3 completed shifts: In: 2345.2 [I.V.:523.9; NG/GT:1771.3; IV Piggyback:50] Out: 2000 [Other:2000]  Intake/Output this shift:  Total I/O In: 282.2 [I.V.:32.2; NG/GT:250] Out: -  Weight change: -0.5 kg Gen:ill-appearing and fatigued. CVS: tachy at 112 Resp: occ rhonchi Abd: +BS< soft, NT/ND Ext: trace presacral edema  Recent Labs  Lab 04/07/23 2201 04/08/23 0237 04/08/23 2203 04/09/23 0802 04/09/23 1843 04/10/23 0357 04/10/23 1600 04/11/23 0410 04/12/23 0506  NA 123*   < > 125* 127* 126* 126* 124* 125* 126*  K 5.8*   < > 4.9 5.1 5.5* 5.7* 6.1* 4.1 4.6  CL 89*   < > 92* 90* 88* 87* 85* 89* 87*  CO2 22   < > 23 25 26 24 23 27 26   GLUCOSE 190*   < > 240* 209* 208* 145* 258* 172* 224*  BUN 115*   < > 70* 87* 103* 118* 135* 68* 118*  CREATININE 5.65*   < > 3.91* 4.80* 5.09* 5.83* 6.34* 3.58* 5.27*  ALBUMIN 1.9*   < > 1.9* 1.9* 2.0* 1.9* 1.9* 1.8* 1.8*  CALCIUM 7.9*   < > 7.5* 7.9* 8.0* 8.1* 7.9* 7.4* 7.7*  PHOS  --    < > 4.5 5.1* 6.3* 7.0* 7.2* 4.7* 4.5  AST 43*  --   --   --   --   --   --   --   --   ALT <5  --   --   --   --   --   --   --   --    < > = values in this interval not displayed.   Liver Function Tests: Recent Labs  Lab 04/07/23 2201 04/08/23 0237 04/10/23 1600 04/11/23 0410 04/12/23 0506  AST 43*  --   --   --   --   ALT <5  --   --   --   --   ALKPHOS 86  --   --   --   --   BILITOT 0.4  --   --   --   --   PROT 6.1*  --   --   --   --   ALBUMIN 1.9*   < > 1.9* 1.8* 1.8*   < > = values in this interval not displayed.    No results for input(s): "LIPASE", "AMYLASE" in the last 168 hours. No results for input(s): "AMMONIA" in the last 168 hours. CBC: Recent Labs  Lab 04/07/23 0246 04/08/23 0237 04/08/23 2203 04/11/23 0410 04/12/23 0506  WBC 24.4* 27.3* 24.1* 14.7* 11.5*  HGB 7.7* 6.9* 7.4* 7.1* 6.5*  HCT 24.0* 20.6* 21.8* 21.4* 20.0*  MCV 101.7* 100.5* 97.8 100.5* 99.0  PLT 281 281 293 268 230   Cardiac Enzymes: No results for input(s): "CKTOTAL", "CKMB", "CKMBINDEX", "TROPONINI" in the last 168 hours. CBG: Recent Labs  Lab 04/11/23 0758 04/11/23 1133 04/11/23 1604 04/11/23 2111 04/12/23 0604  GLUCAP 169* 146*  170* 141* 215*    Iron Studies: No results for input(s): "IRON", "TIBC", "TRANSFERRIN", "FERRITIN" in the last 72 hours. Studies/Results: No results found.  sodium chloride   Intravenous Once   amiodarone  400 mg Per Tube BID   aspirin  81 mg Per Tube Daily   atorvastatin  40 mg Per Tube Daily   Chlorhexidine Gluconate Cloth  6 each Topical Daily   Chlorhexidine Gluconate Cloth  6 each Topical Q0600   feeding supplement (PROSource TF20)  60 mL Per Tube TID   folic acid  1 mg Per Tube Daily   Gerhardt's butt cream   Topical TID   insulin aspart  0-5 Units Subcutaneous QHS   insulin aspart  0-9 Units Subcutaneous TID WC   lidocaine  1 patch Transdermal Q24H   metoCLOPramide (REGLAN) injection  5 mg Intravenous Q8H   midodrine  15 mg Per Tube TID WC   multivitamin  1 tablet Oral BID   nicotine  21 mg Transdermal Daily   mouth rinse  15 mL Mouth Rinse 4 times per day   sodium zirconium cyclosilicate  10 g Per Tube BID   thiamine  100 mg Per Tube Daily    BMET    Component Value Date/Time   NA 126 (L) 04/12/2023 0506   K 4.6 04/12/2023 0506   CL 87 (L) 04/12/2023 0506   CO2 26 04/12/2023 0506   GLUCOSE 224 (H) 04/12/2023 0506   BUN 118 (H) 04/12/2023 0506   CREATININE 5.27 (H) 04/12/2023 0506   CALCIUM 7.7 (L) 04/12/2023 0506   GFRNONAA 10 (L) 04/12/2023 0506    GFRAA 30 (L) 06/07/2019 0321   CBC    Component Value Date/Time   WBC 11.5 (H) 04/12/2023 0506   RBC 2.02 (L) 04/12/2023 0506   HGB 6.5 (LL) 04/12/2023 0506   HCT 20.0 (L) 04/12/2023 0506   PLT 230 04/12/2023 0506   MCV 99.0 04/12/2023 0506   MCH 32.2 04/12/2023 0506   MCHC 32.5 04/12/2023 0506   RDW 17.9 (H) 04/12/2023 0506   LYMPHSABS 0.4 (L) 03/30/2023 0251   MONOABS 1.1 (H) 03/30/2023 0251   EOSABS 0.0 03/30/2023 0251   BASOSABS 0.0 03/30/2023 0251    Pt is a 83 y.o. yo male  with PMH significant for hypertension, type II DM, CAD, A-fib, CKD (f/b Dr. Glenna Fellows) was admitted with shortness of breath, seen as a consultation for the evaluation and management of acute kidney injury.    Assessment/Plan:   AKI/CKD stage IV - ischemic ATN in setting of septic shock.  [place] without obstruction.  Refractory to diuretics and hypotension, started on CRRT 1/15-1/21/25 after temp HD catheter placed by PCCM.  Off of CRRT and had IHD last early yesterday am due to refractory hyperkalemia and anuria.  Will continue to follow for signs of renal recovery.  Will plan for HD again on Wed and keep on MWF schedule for now.  Septic shock due to staph  bacteremia - per PCCM Staph aureus empyema/Right pleural effusion - s/p chest tube.  ID following and on cefazolin. Acute RV failre with CHF -  UF as tolerated with HD.   Chronic atrial fibrillation - possible DCCV when more stable.  On amiodarone and heparin drip.   Acute on chronic Anemia - transfuse for Hgb <7.  Receiving blood today due to Hgb of 6.5.  Irena Cords, MD [facility]

## 2023-04-12 NOTE — Assessment & Plan Note (Addendum)
Echo demonstrated LVEF 50-55%, moderate LVH, RV mildly enlarged, LA and RA mildly dilated, and trivial MVR.  Received IV Lasix 80mg  initially in the ED. On lasix 40mg  daily at home. - Ted hose - Daily weights - Strict I/Os - Volume management per iHD

## 2023-04-12 NOTE — Assessment & Plan Note (Addendum)
Ischemic ATN in setting of septic shock. US obtained with no sign of obstruction. Has had refractory hyperkalemia throughout admission. Received IHD last night due to refractory hyperkalemia and anuria (CRRT 1/15-1/21). Nephrology planning for dialysis MWF (next on Wed) - Nephrology following, appreciate recs - continue Lokelma 10g BID - renal diet - Strict I/O - daily weights - am RFP, Mag

## 2023-04-12 NOTE — Assessment & Plan Note (Addendum)
Cortrak in place, TF initiated 1/17. TF rate decreased to 20 ml/hr by Provider (not in order in [facility]) to see if appetite will improve 1/21. On 1/22, Episode of emesis after eating greasy sausage; abd xray with Cortrak tip in stomach, unremarkable bowel gas pattern. On 1/23, dysphagia 3/thin diet recommended by SLP. Made NPO 1/27 due to increased weakness with chewing foods.  - SLP following - Zofran 4mg  q6h PRN nausea/vomiting - Decrease Reglan to 5mg  q8h (goal to discontinue)

## 2023-04-12 NOTE — Progress Notes (Signed)
PHARMACY - ANTICOAGULATION CONSULT NOTE  Pharmacy Consult for IV heparin Indication: atrial fibrillation  Allergies  Allergen Reactions   Allopurinol     Possible DRESS syndrome   Colchicine     Possible DRESS syndrome    Patient Measurements: Height: 6\' 1"  (185.4 cm) Weight: 94.7 kg (208 lb 12.4 oz) IBW/kg (Calculated) : 79.9 Heparin Dosing Weight: 89 kg  Vital Signs: Temp: 98.5 F (36.9 C) (01/28 1200) Temp Source: Axillary (01/28 1200) BP: 116/48 (01/28 1215) Pulse Rate: 92 (01/28 1230)  Labs: Recent Labs    04/10/23 1600 04/10/23 1600 04/11/23 0410 04/11/23 0942 04/12/23 0506 04/12/23 1441  HGB  --    < > 7.1*  --  6.5* 8.1*  HCT  --   --  21.4*  --  20.0* 23.3*  PLT  --   --  268  --  230  --   HEPARINUNFRC 0.25*  --  0.60 0.43 0.25*  --   CREATININE 6.34*  --  3.58*  --  5.27*  --    < > = values in this interval not displayed.    Estimated Creatinine Clearance: 12.4 mL/min (A) (by C-G formula based on SCr of 5.27 mg/dL (H)).  Assessment: 83 yo male with new afib, previously on heparin this admission, then it was held for intrapleural thrombolytics. Pharmacy asked to resume heparin 1/19.  Hgb down to 6.5 today.  No overt bleeding or complications noted, suspect just ongoing anemia with routine lab draws and new renal disease.    Heparin was held for ~ 1 hour this morning at the initial report of low Hgb but it was resumed after patient MD assessment.  Heparin level 0.25 on 1500 units/hr.  Slightly subtherapeutic but will not increase at this time due to low Hgb this AM.  Goal of Therapy:  Heparin level 0.3-0.7 units/ml Monitor platelets by anticoagulation protocol: Yes   Plan:  Continue IV heparin at 1500 units/hr Daily heparin level and CBC F/u transition to DOAC when appropriate   Thank you for involving pharmacy in this patient's care.  [employer], Pharm.D., BCPS Clinical Pharmacist  **Pharmacist phone directory can be found on  [email or web address] listed under [facility].  04/12/2023 3:46 PM

## 2023-04-13 ENCOUNTER — Inpatient Hospital Stay (HOSPITAL_COMMUNITY): Payer: Medicare Other

## 2023-04-13 DIAGNOSIS — I4819 Other persistent atrial fibrillation: Secondary | ICD-10-CM | POA: Diagnosis not present

## 2023-04-13 DIAGNOSIS — I5032 Chronic diastolic (congestive) heart failure: Secondary | ICD-10-CM | POA: Diagnosis not present

## 2023-04-13 DIAGNOSIS — J869 Pyothorax without fistula: Secondary | ICD-10-CM | POA: Diagnosis not present

## 2023-04-13 LAB — GLUCOSE, CAPILLARY
Glucose-Capillary: 168 mg/dL — ABNORMAL HIGH (ref 70–99)
Glucose-Capillary: 222 mg/dL — ABNORMAL HIGH (ref 70–99)
Glucose-Capillary: 164 mg/dL — ABNORMAL HIGH (ref 70–99)
Glucose-Capillary: 179 mg/dL — ABNORMAL HIGH (ref 70–99)
Glucose-Capillary: 172 mg/dL — ABNORMAL HIGH (ref 70–99)

## 2023-04-13 LAB — CBC
HCT: 24.5 % — ABNORMAL LOW (ref 39.0–52.0)
Hemoglobin: 8 g/dL — ABNORMAL LOW (ref 13.0–17.0)
MCH: 31.7 pg (ref 26.0–34.0)
MCHC: 32.7 g/dL (ref 30.0–36.0)
MCV: 97.2 fL (ref 80.0–100.0)
Platelets: 232 10*3/uL (ref 150–400)
RBC: 2.52 MIL/uL — ABNORMAL LOW (ref 4.22–5.81)
RDW: 18.3 % — ABNORMAL HIGH (ref 11.5–15.5)
WBC: 13.7 10*3/uL — ABNORMAL HIGH (ref 4.0–10.5)
nRBC: 0.1 % (ref 0.0–0.2)

## 2023-04-13 LAB — RENAL FUNCTION PANEL
Albumin: 1.9 g/dL — ABNORMAL LOW (ref 3.5–5.0)
Anion gap: 16 — ABNORMAL HIGH (ref 5–15)
BUN: 141 mg/dL — ABNORMAL HIGH (ref 8–23)
CO2: 24 mmol/L (ref 22–32)
Calcium: 7.9 mg/dL — ABNORMAL LOW (ref 8.9–10.3)
Chloride: 85 mmol/L — ABNORMAL LOW (ref 98–111)
Creatinine, Ser: 6.23 mg/dL — ABNORMAL HIGH (ref 0.61–1.24)
GFR, Estimated: 8 mL/min — ABNORMAL LOW (ref >60)
Glucose, Bld: 157 mg/dL — ABNORMAL HIGH (ref 70–99)
Phosphorus: 6.2 mg/dL — ABNORMAL HIGH (ref 2.5–4.6)
Potassium: 4.9 mmol/L (ref 3.5–5.1)
Sodium: 125 mmol/L — ABNORMAL LOW (ref 135–145)

## 2023-04-13 LAB — BLOOD GAS, VENOUS
Acid-Base Excess: 2.2 mmol/L — ABNORMAL HIGH (ref 0.0–2.0)
Bicarbonate: 29.1 mmol/L — ABNORMAL HIGH (ref 20.0–28.0)
O2 Saturation: 78.2 %
Patient temperature: 37.6
pCO2, Ven: 55 mm[Hg] (ref 44–60)
pH, Ven: 7.33 (ref 7.25–7.43)
pO2, Ven: 47 mm[Hg] — ABNORMAL HIGH (ref 32–45)

## 2023-04-13 LAB — BPAM RBC
Blood Product Expiration Date: 202502192359
ISSUE DATE / TIME: 202501280836
Unit Type and Rh: 5100

## 2023-04-13 LAB — MAGNESIUM
Magnesium: 2.2 mg/dL (ref 1.7–2.4)
Magnesium: 2.3 mg/dL (ref 1.7–2.4)

## 2023-04-13 LAB — TYPE AND SCREEN
ABO/RH(D): O POS
Antibody Screen: NEGATIVE
Unit division: 0

## 2023-04-13 LAB — TROPONIN I (HIGH SENSITIVITY)
Troponin I (High Sensitivity): 55 ng/L — ABNORMAL HIGH (ref <18)
Troponin I (High Sensitivity): 55 ng/L — ABNORMAL HIGH (ref <18)

## 2023-04-13 LAB — HEPARIN LEVEL (UNFRACTIONATED): Heparin Unfractionated: 0.29 [IU]/mL — ABNORMAL LOW (ref 0.30–0.70)

## 2023-04-13 MED ORDER — VANCOMYCIN HCL IN DEXTROSE 1-5 GM/200ML-% IV SOLN: 1000.0000 mg | INTRAVENOUS | Status: DC

## 2023-04-13 MED ORDER — DARBEPOETIN ALFA 60 MCG/0.3ML IJ SOSY
60.0000 ug | PREFILLED_SYRINGE | INTRAMUSCULAR | Status: DC
  Administered 2023-04-13: 60 ug via SUBCUTANEOUS
  Filled 2023-04-13: qty 0.3

## 2023-04-13 MED ORDER — ALBUMIN HUMAN 25 % IV SOLN
100.0000 g | Freq: Once | INTRAVENOUS | Status: AC
  Administered 2023-04-13: 25 g via INTRAVENOUS
  Filled 2023-04-13: qty 400

## 2023-04-13 MED ORDER — METOPROLOL TARTRATE 5 MG/5ML IV SOLN: 2.5000 mg | INTRAVENOUS | Status: DC

## 2023-04-13 MED ORDER — PIPERACILLIN-TAZOBACTAM IN DEX 2-0.25 GM/50ML IV SOLN
2.2500 g | Freq: Three times a day (TID) | INTRAVENOUS | Status: DC
  Administered 2023-04-14 – 2023-04-15 (×4): 2.25 g via INTRAVENOUS
  Filled 2023-04-13 (×5): qty 50

## 2023-04-13 MED ORDER — METOPROLOL TARTRATE 12.5 MG HALF TABLET: 12.5000 mg | ORAL_TABLET | Freq: Once | ORAL | Status: DC

## 2023-04-13 MED ORDER — METOPROLOL TARTRATE 5 MG/5ML IV SOLN
2.5000 mg | INTRAVENOUS | Status: DC
  Filled 2023-04-13: qty 5

## 2023-04-13 MED ORDER — VANCOMYCIN HCL 2000 MG/400ML IV SOLN
2000.0000 mg | Freq: Once | INTRAVENOUS | Status: AC
  Administered 2023-04-14: 2000 mg via INTRAVENOUS
  Filled 2023-04-13: qty 400

## 2023-04-13 MED ORDER — ALBUMIN HUMAN 25 % IV SOLN: 100.0000 g | Freq: Once | INTRAVENOUS | Status: DC

## 2023-04-13 MED ORDER — ALBUMIN HUMAN 25 % IV SOLN
100.0000 g | INTRAVENOUS | Status: DC
  Administered 2023-04-15: 100 g via INTRAVENOUS
  Filled 2023-04-13: qty 400

## 2023-04-13 NOTE — Progress Notes (Addendum)
Patient is received to the Unit from HD, patient noticed different than this afternoon, before sending him for HD,  noticed facial droop on right side of his face, and he was more drowsy  MD and RR notified. CBG checked; 179 MD is at bedside.  Code stroke called.   04/13/23 1900  Vitals  BP (!) 131/58  MAP (mmHg) 77  BP Location Left Arm  BP Method Automatic  Patient Position (if appropriate) Lying  Pulse Rate 91  Pulse Rate Source Monitor  ECG Heart Rate 96  Resp (!) 23  Level of Consciousness  Level of Consciousness Alert  MEWS COLOR  MEWS Score Color Green  Oxygen Therapy  SpO2 97 %  O2 Device Nasal Cannula  O2 Flow Rate (L/min) 3 L/min  MEWS Score  MEWS Temp 0  MEWS Systolic 0  MEWS Pulse 0  MEWS RR 1  MEWS LOC 0  MEWS Score 1

## 2023-04-13 NOTE — Progress Notes (Signed)
Received report from day RN that Rapid has been called upon receiving patient back from dialysis, concern for CVA. Code stroke. CT ordered to r/o ICH. This RN transported patient to CT for emergent scan.  Scan completed and neurologist has ruled out ICH, has ordered resumption of heparin gtt.  This RN received call from family for an update on patient. Updated Zachary Parrish. Will continue to closely monitor patient.

## 2023-04-13 NOTE — Progress Notes (Signed)
Patient ID: Zachary Parrish, male   DOB: 04/13/1940, 83 y.o.   MRN: [id number] S: Feels weak today.  Also developed A fib with RVR. O:BP (!) 125/49   Pulse (!) 138   Temp 98 F (36.7 C) (Oral)   Resp 19   Ht 6\' 1"  (1.854 m)   Wt 100.5 kg   SpO2 98%   BMI 29.23 kg/m   Intake/Output Summary (Last 24 hours) at 04/13/2023 1440 Last data filed at 04/13/2023 0900 Gross per 24 hour  Intake 1434.95 ml  Output --  Net 1434.95 ml   Intake/Output: I/O last 3 completed shifts: In: 2990.7 [I.V.:480.7; Blood:450; NG/GT:1960; IV Piggyback:100] Out: 0   Intake/Output this shift:  Total I/O In: 150 [NG/GT:150] Out: -  Weight change: 5.8 kg EXB:[id number] ill-appearing, fatigued CVS: IRR IRR and tachy Resp:poor inspiratory effort Abd: +BS< soft, NT/ND Ext: trace pretibial edema.  Recent Labs  Lab 04/07/23 2201 04/08/23 0237 04/09/23 0802 04/09/23 1843 04/10/23 0357 04/10/23 1600 04/11/23 0410 04/12/23 0506 04/13/23 0146  NA 123*   < > 127* 126* 126* 124* 125* 126* 125*  K 5.8*   < > 5.1 5.5* 5.7* 6.1* 4.1 4.6 4.9  CL 89*   < > 90* 88* 87* 85* 89* 87* 85*  CO2 22   < > 25 26 24 23 27 26 24   GLUCOSE 190*   < > 209* 208* 145* 258* 172* 224* 157*  BUN 115*   < > 87* 103* 118* 135* 68* 118* 141*  CREATININE 5.65*   < > 4.80* 5.09* 5.83* 6.34* 3.58* 5.27* 6.23*  ALBUMIN 1.9*   < > 1.9* 2.0* 1.9* 1.9* 1.8* 1.8* 1.9*  CALCIUM 7.9*   < > 7.9* 8.0* 8.1* 7.9* 7.4* 7.7* 7.9*  PHOS  --    < > 5.1* 6.3* 7.0* 7.2* 4.7* 4.5 6.2*  AST 43*  --   --   --   --   --   --   --   --   ALT <5  --   --   --   --   --   --   --   --    < > = values in this interval not displayed.   Liver Function Tests: Recent Labs  Lab 04/07/23 2201 04/08/23 0237 04/11/23 0410 04/12/23 0506 04/13/23 0146  AST 43*  --   --   --   --   ALT <5  --   --   --   --   ALKPHOS 86  --   --   --   --   BILITOT 0.4  --   --   --   --   PROT 6.1*  --   --   --   --   ALBUMIN 1.9*   < > 1.8* 1.8* 1.9*   < > = values in  this interval not displayed.   No results for input(s): "LIPASE", "AMYLASE" in the last 168 hours. No results for input(s): "AMMONIA" in the last 168 hours. CBC: Recent Labs  Lab 04/08/23 0237 04/08/23 2203 04/11/23 0410 04/12/23 0506 04/12/23 1441 04/13/23 0146  WBC 27.3* 24.1* 14.7* 11.5*  --  13.7*  HGB 6.9* 7.4* 7.1* 6.5* 8.1* 8.0*  HCT 20.6* 21.8* 21.4* 20.0* 23.3* 24.5*  MCV 100.5* 97.8 100.5* 99.0  --  97.2  PLT 281 293 268 230  --  232   Cardiac Enzymes: No results for input(s): "CKTOTAL", "CKMB", "CKMBINDEX", "TROPONINI" in the last 168  hours. CBG: Recent Labs  Lab 04/12/23 1201 04/12/23 1650 04/12/23 2202 04/13/23 0601 04/13/23 1119  GLUCAP 165* 147* 148* 168* 222*    Iron Studies: No results for input(s): "IRON", "TIBC", "TRANSFERRIN", "FERRITIN" in the last 72 hours. Studies/Results: DG Chest 1 View Result Date: 04/13/2023 CLINICAL DATA:  Shortness of breath, tachycardia EXAM: CHEST  1 VIEW COMPARISON:  04/08/2023 FINDINGS: Left dialysis catheter and feeding tube remain in place, unchanged. Cardiomegaly, vascular congestion. Bilateral pleural effusions, right greater than left. Bibasilar airspace opacities, favor atelectasis. No real change since prior study. IMPRESSION: Cardiomegaly, vascular congestion. Bilateral pleural effusions, right greater than left with bibasilar atelectasis. Electronically Signed   By: Charlett Nose M.D.   On: 04/13/2023 10:26    sodium chloride   Intravenous Once   amiodarone  400 mg Per Tube BID   aspirin  81 mg Per Tube Daily   atorvastatin  40 mg Per Tube Daily   Chlorhexidine Gluconate Cloth  6 each Topical Daily   Chlorhexidine Gluconate Cloth  6 each Topical Q0600   feeding supplement (PROSource TF20)  60 mL Per Tube TID   folic acid  1 mg Per Tube Daily   Gerhardt's butt cream   Topical TID   insulin aspart  0-5 Units Subcutaneous QHS   insulin aspart  0-9 Units Subcutaneous TID WC   lidocaine  1 patch Transdermal Q24H    metoCLOPramide (REGLAN) injection  5 mg Intravenous Q8H   midodrine  15 mg Per Tube TID WC   multivitamin  1 tablet Oral BID   nicotine  21 mg Transdermal Daily   mouth rinse  15 mL Mouth Rinse 4 times per day   sodium zirconium cyclosilicate  10 g Per Tube BID   thiamine  100 mg Per Tube Daily    BMET    Component Value Date/Time   NA 125 (L) 04/13/2023 0146   K 4.9 04/13/2023 0146   CL 85 (L) 04/13/2023 0146   CO2 24 04/13/2023 0146   GLUCOSE 157 (H) 04/13/2023 0146   BUN 141 (H) 04/13/2023 0146   CREATININE 6.23 (H) 04/13/2023 0146   CALCIUM 7.9 (L) 04/13/2023 0146   GFRNONAA 8 (L) 04/13/2023 0146   GFRAA 30 (L) 06/07/2019 0321   CBC    Component Value Date/Time   WBC 13.7 (H) 04/13/2023 0146   RBC 2.52 (L) 04/13/2023 0146   HGB 8.0 (L) 04/13/2023 0146   HCT 24.5 (L) 04/13/2023 0146   PLT 232 04/13/2023 0146   MCV 97.2 04/13/2023 0146   MCH 31.7 04/13/2023 0146   MCHC 32.7 04/13/2023 0146   RDW 18.3 (H) 04/13/2023 0146   LYMPHSABS 0.4 (L) 03/30/2023 0251   MONOABS 1.1 (H) 03/30/2023 0251   EOSABS 0.0 03/30/2023 0251   BASOSABS 0.0 03/30/2023 0251     Pt is a 83 y.o. yo male  with PMH significant for hypertension, type II DM, CAD, A-fib, CKD (f/b Dr. Glenna Fellows) was admitted with shortness of breath, seen as a consultation for the evaluation and management of acute kidney injury.    Assessment/Plan:   AKI/CKD stage IV - ischemic ATN in setting of septic shock.  [place] without obstruction.  Refractory to diuretics and hypotension, started on CRRT 1/15-1/21/25 after temp HD catheter placed by PCCM.  Off of CRRT and had IHD last early yesterday am due to refractory hyperkalemia and anuria.  Will continue to follow for signs of renal recovery.  Will plan for HD again today and keep on  MWF schedule for now.  Hopefully he will tolerate HD given a fib with rvr. Vascular access - will need TDC if he is to continue with HD.  Will consult IR tomorrow.  Septic shock due to staph   bacteremia - per PCCM Staph aureus empyema/Right pleural effusion - s/p chest tube.  ID following and on cefazolin. Acute RV failure with CHF -  UF as tolerated with HD.   Chronic atrial fibrillation with RVR- possible DCCV when more stable.  On amiodarone and heparin drip.  Metoprolol 12.5 mg started last night.  Cardiology signed off.  Acute on chronic Anemia - transfuse for Hgb <7.  Received blood yesterday due to Hgb of 6.5.  Improved to 8 today.  Continue to follow. Will add ESA. Severe protein malnutrition - per primary Disposition - poor overall prognosis.  Recommend palliative care consult to set goals/limits of care.  He is a marginal HD candidate given advanced age, biventricular heart failure, hypotension, multiple co-morbidities, and poor functional and nutritional status.    Irena Cords, MD [facility]

## 2023-04-13 NOTE — Assessment & Plan Note (Addendum)
Ischemic ATN in setting of septic shock. US obtained with no sign of obstruction. Has had refractory hyperkalemia throughout admission. Received IHD last night due to refractory hyperkalemia and anuria (CRRT 1/15-1/21). Nephrology planning for dialysis MWF (next today) - Nephrology following, appreciate recs - continue Lokelma 10g BID - renal diet - Strict I/O - daily weights - am RFP, Mag

## 2023-04-13 NOTE — Progress Notes (Signed)
Pharmacy Antibiotic Note  Zachary Parrish is a 83 y.o. male admitted for shortness of breath and large right pleural effusion (MSSA empyema). Overnight patient became increasingly lethargic and febrile. Pharmacy has been consulted for zosyn and vancomycin dosing.  -Cefazolin 1g IV every 24 hours  -WBC 11.5 > 13.7, rectal temp 100.4  Plan: -Zosyn 2.25 gm IV every 8 hours -Vancomycin 2g IV x1 -Vancomycin 1000 mg IV after every HD session (MWF) -Follow up signs of clinical improvement, LOT, de-escalation of antibiotics back to cefazolin  Height: 6\' 1"  (185.4 cm) Weight: 100.5 kg (221 lb 9 oz) IBW/kg (Calculated) : 79.9  Temp (24hrs), Avg:98.4 F (36.9 C), Min:97.6 F (36.4 C), Max:100.4 F (38 C)  Recent Labs  Lab 04/08/23 0237 04/08/23 1541 04/08/23 2203 04/09/23 0802 04/10/23 0357 04/10/23 1600 04/11/23 0410 04/12/23 0506 04/13/23 0146  WBC 27.3*  --  24.1*  --   --   --  14.7* 11.5* 13.7*  CREATININE 5.66*   < > 3.91*   < > 5.83* 6.34* 3.58* 5.27* 6.23*   < > = values in this interval not displayed.    Estimated Creatinine Clearance: 11.6 mL/min (A) (by C-G formula based on SCr of 6.23 mg/dL (H)).    Allergies  Allergen Reactions   Allopurinol     Possible DRESS syndrome   Colchicine     Possible DRESS syndrome    Antimicrobials this admission: Zosyn 1/29 >> Vancomycin 1/29 >> CTX + azith x1 on 1/14 Zyvox/CTX 1/15 >> 1/16 Cefazolin 1/16 >> 1/29 Merrem x 1 1/23  Unasyn 1/23>1/24   Microbiology results: 1/29 BCx:  1/15 Bcx: neg 1/14 right pleural fluid: MSSA 1/23 MRSA pcr neg 1/23 Sputum: Mod candida albicans 1/26 Sputum: neg  Thank you for allowing pharmacy to be a part of this patient's care.  Arabella Merles, PharmD. Clinical Pharmacist 04/13/2023 10:40 PM

## 2023-04-13 NOTE — Progress Notes (Addendum)
Brief Neuro Note:  I was called to bedside by our rapid response team to evaluate patient. Mr. Heyden Jaber had just undergone HD and is lethargic, encephalopathic afterwards. There was concern that he might have facial asymmetry.  On my evaluation, he is lethargic, has difficulty clearing mucus. Vitals are fine, sats are fine, glucose is fine. He makes good eye contact on both side, has symmetric facial smile and grimace and sticks his tongue out on command. He is oriented to self but not to his age. Wiggles toes on commands and gives me a thumbs up. I do not see obvious focal neuro deficit. Specifically, no facial droop or symmetry, no aphasia out of proportion to encephalopathy.  I spoke briefly with the resident team at bedside and requested pausing heparin and getting a CT Head to rule out ICH. Can resume heparin if CT Head is negative.  Update 8:48 PM Reviewed CT Head and is stable, full radiology read is pending. Can resume heparin gtt.  Erick Blinks [facility]

## 2023-04-13 NOTE — Assessment & Plan Note (Addendum)
Presented initially in hypoxic respiratory failure secondary to right pleural effusion. CT obtained with large R pleural effusion with pleural thickening and rind, thoracostomy tube was placed with exudative pleural fluid with MSSA empyema. Initially received Ceftriaxone and Azithro (1/14), switched to CTX and Linezolid, now on Cefazolin. Bcx collected after abx started Ngx5d. MRSA PCR negative. MRI L-spine negative for osteomyelitis.  Pt has remote hx MSSA bacteremia >10 y ago. Sputum culture collected with candida albicans, although suspected to be colonized. WBC had been trending upward but now improving. Cough worsened overnight, CXR and RVP obtained. CXR with bilateral pleural effusions. RVP pending - ID following, appreciate recommendations - Pulmonology consulted regarding CXR that appears worsened with increased cough - repeat sputum culture ordered 1/27 - Wean O2 as able, goal >90% - Continue Cefazolin (Discharge: if on iHD - cefazolin with HD x4-6 weeks from chest tube removal through 3/4, if not on iHD: cefadroxil renally dosed) - am CBC - Robitussin q4h PRN for cough - Atarax 10mg  TID PRN for anxiety - Oxycodone 5-10mg  q4h PRN moderate-severe pain

## 2023-04-13 NOTE — Assessment & Plan Note (Addendum)
Previously on Coreg 25 mg BID and Telmisartan 20 mg daily for HTN at home however has been hypotensive throughout admission, requiring ICU for Levophed drip. BP now stable and has been off pressors. Became hypotensive last night after receiving metoprolol for rate control so it became held.  - Continue Midodrine 15mg  TID - Ted hose

## 2023-04-13 NOTE — Assessment & Plan Note (Addendum)
No meds at home. Controlled in the 100s here.  - CBGs 4 times daily, before meals and at bedtime - sensitive SSI with HS coverage

## 2023-04-13 NOTE — Progress Notes (Signed)
Speech Language Pathology Treatment: Dysphagia  Patient Details Name: Zachary Parrish MRN: [id number] DOB: [birth year] Today's Date: 04/13/2023 Time: 2130-8657 SLP Time Calculation (min) (ACUTE ONLY): 22 min  Assessment / Plan / Recommendation Clinical Impression  Zachary Parrish was a bit more alert today.  HOB elevated.  Wife at bedside.  He accepted ice chips, limited teaspoons of water, and several bites of pudding with improved attention and effort to manipulate/control POs.  Oral holding/residue persists.  Swallow was palpable; voice was clear post-swallow. There was coughing on <25% of trials overall.  Spoke with RN. Recommend continuing ice chips; allow bites of applesauce/pudding from floor stock when he is alert.  Not ready to resume an oral diet. SLP will follow.   HPI HPI: 83 y.o. male presented to ED 1/14 with SOB and found to have AKI due to ischemic ATN r/t septic shock. PMH: HTN, T2DM, CAD, A-fib. Dx large R pleural effusion and small L pleural effusion. Chest tube in place and culture grew staph aureus. CRRT 1/16; transitioned to HD M/W/F.  MBS 1/23 aspiration thin liquids; chin tuck/small sips were preventative. Esophagram 1/24: mild-mod esophageal dysmotility; mild tapered narrowing of distal esophagus, recs for endoscopy per imaging note. Diet downgraded to dysphagia 1/nectar-thick liquids 1/27 due to inability to f/c for chin tuck, poorer toleration of diet.  Pt subsequently made NPO due to lethargy.  Cortrak in place.      SLP Plan  Continue with current plan of care      Recommendations for follow up therapy are one component of a multi-disciplinary discharge planning process, led by the attending physician.  Recommendations may be updated based on patient status, additional functional criteria and insurance authorization.    Recommendations  Diet recommendations: Other(comment) (puree from floor stock when awake; ice chips) Medication Administration: Via alternative  means Supervision: Full supervision/cueing for compensatory strategies Compensations: Slow rate Postural Changes and/or Swallow Maneuvers: Seated upright 90 degrees;Upright 30-60 min after meal                  Oral care QID   Frequent or constant Supervision/Assistance Dysphagia, oropharyngeal phase (R13.12)     Continue with current plan of care    Zachary Parrish L. Zachary Frederic, MA CCC/SLP Clinical Specialist - Acute Care SLP Acute Rehabilitation Services Office number [phone]  Zachary Parrish  04/13/2023, 1:17 PM

## 2023-04-13 NOTE — Progress Notes (Addendum)
Patient Name: Zachary Parrish Date of Encounter: 04/13/2023 [facility] Cardiologist: Rollene Rotunda, MD   Interval Summary  .    He seems confused today, not sure what his baseline is but he does not answer questions very well.  His wife is accompanied with him, reports that he has been about the same for the past several days.  Looks slightly short of breath. Vital Signs .    Vitals:   04/13/23 0500 04/13/23 0600 04/13/23 0735 04/13/23 0900  BP: (!) 132/92 126/82 (!) 147/81 (!) 137/51  Pulse: (!) 126 (!) 115 (!) 30 88  Resp: (!) 22 (!) 22 (!) 21 17  Temp:   98 F (36.7 C)   TempSrc:   Oral   SpO2: 99% 100% 95% 98%  Weight: 100.5 kg     Height:        Intake/Output Summary (Last 24 hours) at 04/13/2023 1104 Last data filed at 04/13/2023 0900 Gross per 24 hour  Intake 1884.95 ml  Output --  Net 1884.95 ml      04/13/2023    5:00 AM 04/12/2023    5:00 AM 04/11/2023   11:37 PM  Last 3 Weights  Weight (lbs) 221 lb 9 oz 208 lb 12.4 oz 208 lb 12.4 oz  Weight (kg) 100.5 kg 94.7 kg 94.7 kg      Telemetry/ECG    Atrial fibrillation heart rates between 95 to 120- Personally Reviewed  CV Studies    Echocardiogram 03/30/2023 1. Technically difficult study with poor windows and patient in Afib with  RVR. Left ventricular ejection fraction, by estimation, is 50 to 55%. The  left ventricle has low normal function. The left ventricle has no regional  wall motion abnormalities.  There is moderate left ventricular hypertrophy. Left ventricular diastolic  parameters are indeterminate.   2. Right ventricular systolic function is moderately reduced. The right  ventricular size is mildly enlarged.   3. Left atrial size was moderately dilated.   4. Right atrial size was mildly dilated.   5. The mitral valve is normal in structure. Trivial mitral valve  regurgitation. No evidence of mitral stenosis.   6. The aortic valve was not well visualized. Aortic valve regurgitation  is  not visualized. No aortic stenosis is present.    Physical Exam .   GEN: No acute distress.   Neck: Difficult to assess, limited range of motion in the neck.  Suspect mild JVD Cardiac: Irregularly irregular. Respiratory: Clear to auscultation bilaterally. GI: Soft, nontender, non-distended  MS: 1+ distal edema  Patient Profile    Zachary Parrish is a 83 y.o. 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  Admitted for large right pleural effusion-empyema, A-fib RVR.  Assessment & Plan .     Persistent atrial fibrillation Cardiology asked to reround due to elevated heart rates yesterday.  Looks like he was severely anemic yesterday with a hemoglobin of 6.5 status post transfusion.  Rates are improved today, generally around 110.  I think given his comorbid conditions this is reasonable given his comorbid conditions and preserved EF. Would not recommend any changes at this time, if he were to sustain elevated rates would continue to correct secondary causes, blood pressure does look suitable for Lopressor, so this could be be added if stable.   Continue p.o. amiodarone 400 mg twice daily and heparin drip.  Transition to DOAC once  able. Can consider TEE/DCCV once more stable and back on a DOAC. Was last in sinus in 2023  Chronic HFpEF Technically difficult but EF 50 to 55% with moderate LVH.  Moderately reduced RV function.  Looks mildy volume up. Nephrology managing volume with HD. Still remains on midodrine 15 mg 3 times daily  CAD Last catheterization 2014 with 60 or 70% distal left main stenosis no intervention.  No chest pain.  Continue aspirin and statin  Anemia Right sided empyema with MSSA Septic shock CKD Alcohol abuse Per primary team   For questions or updates, please contact [facility] Please consult [email or web address] for contact info under         Signed, Abagail Kitchens, PA-C

## 2023-04-13 NOTE — Progress Notes (Signed)
Daily Progress Note Intern Pager: [phone]  Patient name: Zachary Parrish Medical record number: [id number] Date of birth: 1940/07/17 Age: 83 y.o. Gender: male  Primary Care Provider: Verl Bangs, MD Consultants: Nephrology, Cardiology, PCCM (signed off), ID (signed off) Code Status: Full  Pt Overview and Major Events to Date:  1/14 - Admitted to FMTS for acute hypoxic respiratory failure 2/2 R pulmonary effusion, CCM placed chest tube, cards consulted for uncontrolled afib 1/15 - Anuric (ARF 2/2 ischemic ATN in setting of septic shock), hypotensive, transferred to ICU for pressors. ID consulted and added Linezolid and Ceftriaxone for MSSA empyema. CRRT started. 1/16 - Linezolid/Ceftriaxone d/c'd, Cefazolin started. Cortrak placed. 1/21 - CRRT stopped, Lokelma started for hyperkalemia.  1/23 - sputum culture moderate candida albicans, thought to be colonized 1/24 - Remained anuric/hyperkalemic, HD initiated again. Came off pressors. 1/27 - Transferred back to FMTS  Assessment and Plan:  82yo PMHx Afib s/p DCCV, carotid stenosis, CKD stage 3b, HLD, CAD, HTN, T2DM who presented initially with shortness of breath, found to have large right pulmonary effusion and initially in afib with RVR. Appreciate cardiology and pulmonology assistance with this patient Assessment & Plan Empyema of right pleural space (HCC) Presented initially in hypoxic respiratory failure secondary to right pleural effusion. CT obtained with large R pleural effusion with pleural thickening and rind, thoracostomy tube was placed with exudative pleural fluid with MSSA empyema. Initially received Ceftriaxone and Azithro (1/14), switched to CTX and Linezolid, now on Cefazolin. Bcx collected after abx started Ngx5d. MRSA PCR negative. MRI L-spine negative for osteomyelitis.  Pt has remote hx MSSA bacteremia >10 y ago. Sputum culture collected with candida albicans, although suspected to be colonized. WBC had been  trending upward but now improving. Cough worsened overnight, CXR and RVP obtained. CXR with bilateral pleural effusions. RVP pending - ID following, appreciate recommendations - Pulmonology consulted regarding CXR that appears worsened with increased cough - repeat sputum culture ordered 1/27 - Wean O2 as able, goal >90% - Continue Cefazolin (Discharge: if on iHD - cefazolin with HD x4-6 weeks from chest tube removal through 3/4, if not on iHD: cefadroxil renally dosed) - am CBC - Robitussin q4h PRN for cough - Atarax 10mg  TID PRN for anxiety - Oxycodone 5-10mg  q4h PRN moderate-severe pain Atrial fibrillation with RVR (HCC) Rates uncontrolled overnight up to 130s, also EKG with elevated T waves. Cardiology fellow spoken to overnight and recommended metoprolol tartrate 12.5mg . However began having soft pressures after metoprolol so it was held. Cardiology consulted for assistance with rate control given his complexity. - continue Amiodarone - continue Heparin - transition to DOAC when able - need to see when nephrology is planning permanent access - Cardiac monitoring - Cardiology considering TEE/DCCV when stabilized/on stable AC (prior to d/c or outpatient) Hypotension Previously on Coreg 25 mg BID and Telmisartan 20 mg daily for HTN at home however has been hypotensive throughout admission, requiring ICU for Levophed drip. BP now stable and has been off pressors. Became hypotensive last night after receiving metoprolol for rate control so it became held.  - Continue Midodrine 15mg  TID - Ted hose AKI (acute kidney injury) (HCC) Ischemic ATN in setting of septic shock. US obtained with no sign of obstruction. Has had refractory hyperkalemia throughout admission. Received IHD last night due to refractory hyperkalemia and anuria (CRRT 1/15-1/21). Nephrology planning for dialysis MWF (next today) - Nephrology following, appreciate recs - continue Lokelma 10g BID - renal diet - Strict I/O -  daily weights -  am RFP, Mag Anemia of chronic disease Hemoglobin stable at 8 this morning after receiving 1U PRBC yesterday. Likely in the setting of CKD. Receives ESA outpatient if Hgb <10.  - am CBC - transfuse if <7 Acute diastolic heart failure (HCC) Echo demonstrated LVEF 50-55%, moderate LVH, RV mildly enlarged, LA and RA mildly dilated, and trivial MVR.  Received IV Lasix 80mg  initially in the ED. On lasix 40mg  daily at home. - Ted hose - Daily weights - Strict I/Os - Volume management per iHD Type 2 diabetes mellitus with complication, without long-term current use of insulin (HCC) No meds at home. Controlled in the 100s here.  - CBGs 4 times daily, before meals and at bedtime - sensitive SSI with HS coverage Decreased mobility and endurance Likely 2/2 deconditioning in addition to prolonged hospital stay and illness. - PT/OT eval/treat Malnutrition of moderate degree Cortrak in place, TF initiated 1/17. TF rate decreased to 20 ml/hr by Provider (not in order in [facility]) to see if appetite will improve 1/21. On 1/22, Episode of emesis after eating greasy sausage; abd xray with Cortrak tip in stomach, unremarkable bowel gas pattern. On 1/23, dysphagia 3/thin diet recommended by SLP. Made NPO 1/27 due to increased weakness with chewing foods.  - SLP following - Zofran 4mg  q6h PRN nausea/vomiting - Decrease Reglan to 5mg  q8h (goal to discontinue)    Chronic and Stable Problems:  Chronic back pain - continue Robaxin 500mg  q8h PRN, caution for signs of toxicity 2/2 renal failure Alcohol use - RUQ US unremarkable. Continue folate, MVI, thiamine. Did require phenobarbital taper during admission, now outside withdrawal window.  Gout: concern for gout during admission but uric acid normal, got prednisone 20mg  x3 days (ended 1/27) Thyroid lesion - CT chest w/o contrast with 2.4cm cystic lesion in R thyroid lobe, TSH 3.279 on admission HLD - continue lipitor 40mg  Tobacco use - continue  21mg  nicotine patch   FEN/GI: TF via Cortrak PPx: heparin gtt Dispo:SNF pending clinical improvement   Subjective:  Feels like cough is worse since last night, no other complaints currently, unable to tell me how is breathing is  Objective: Temp:  [97.5 F (36.4 C)-98 F (36.7 C)] 98 F (36.7 C) (01/29 1115) Pulse Rate:  [30-150] 109 (01/29 1115) Resp:  [17-27] 23 (01/29 1115) BP: (106-154)/(44-92) 119/59 (01/29 1115) SpO2:  [95 %-100 %] 95 % (01/29 1115) Weight:  [100.5 kg] 100.5 kg (01/29 0500) Physical Exam: General: chronically ill appearing. Cortrak in place.   Cardiovascular: Irregularly irregular, tachycardic, no murmurs Respiratory: Slightly increased work of breathing on 2.5L O2. Decreased breath sounds on the right, coarse breath sounds on the left Abdomen: Soft, non-distended Extremities: Mild edema BLE, compression stockings in place   Laboratory: Most recent CBC Lab Results  Component Value Date   WBC 13.7 (H) 04/13/2023   HGB 8.0 (L) 04/13/2023   HCT 24.5 (L) 04/13/2023   MCV 97.2 04/13/2023   PLT 232 04/13/2023   Most recent BMP    Latest Ref Rng & Units 04/13/2023    1:46 AM  BMP  Glucose 70 - 99 mg/dL 960   BUN 8 - 23 mg/dL 454   Creatinine 0.98 - 1.24 mg/dL 1.19   Sodium 147 - 829 mmol/L 125   Potassium 3.5 - 5.1 mmol/L 4.9   Chloride 98 - 111 mmol/L 85   CO2 22 - 32 mmol/L 24   Calcium 8.9 - 10.3 mg/dL 7.9    Troponin 56>21  Imaging/Diagnostic Tests: CXR 04/13/23  IMPRESSION: Cardiomegaly, vascular congestion. Bilateral pleural effusions, right greater than left with bibasilar atelectasis.  Marino Rogerson, DO 04/13/2023, 12:02 PM  PGY-1, [facility] FPTS Intern pager: [phone], text pages welcome Secure chat group [facility] Teaching Service

## 2023-04-13 NOTE — Progress Notes (Signed)
PHARMACY - ANTICOAGULATION CONSULT NOTE  Pharmacy Consult for IV heparin Indication: atrial fibrillation  Allergies  Allergen Reactions   Allopurinol     Possible DRESS syndrome   Colchicine     Possible DRESS syndrome    Patient Measurements: Height: 6\' 1"  (185.4 cm) Weight: 100.5 kg (221 lb 9 oz) IBW/kg (Calculated) : 79.9 Heparin Dosing Weight: 89 kg  Vital Signs: Temp: 98 F (36.7 C) (01/29 1115) Temp Source: Oral (01/29 1115) BP: 119/59 (01/29 1115) Pulse Rate: 109 (01/29 1115)  Labs: Recent Labs    04/11/23 0410 04/11/23 0942 04/12/23 0506 04/12/23 1441 04/13/23 0146 04/13/23 0424  HGB 7.1*  --  6.5* 8.1* 8.0*  --   HCT 21.4*  --  20.0* 23.3* 24.5*  --   PLT 268  --  230  --  232  --   HEPARINUNFRC 0.60 0.43 0.25*  --   --  0.29*  CREATININE 3.58*  --  5.27*  --  6.23*  --   TROPONINIHS  --   --   --   --  55* 55*    Estimated Creatinine Clearance: 11.6 mL/min (A) (by C-G formula based on SCr of 6.23 mg/dL (H)).  Assessment: 83 yo male with new afib, previously on heparin this admission, then it was held for intrapleural thrombolytics. Pharmacy asked to resume heparin 1/19.  Hgb down to 6.5 today.  No overt bleeding or complications noted, suspect just ongoing anemia with routine lab draws and new renal disease.    Heparin level 0.29 on 1500 units/hr.  Slightly subtherapeutic.  Hgb improved after transfusion 1/28.    Goal of Therapy:  Heparin level 0.3-0.7 units/ml Monitor platelets by anticoagulation protocol: Yes   Plan:  Increase IV heparin to 1600 units/hr Daily heparin level and CBC F/u transition to DOAC when appropriate   Thank you for involving pharmacy in this patient's care.  [employer], Pharm.D., BCPS Clinical Pharmacist  **Pharmacist phone directory can be found on [email or web address] listed under [facility].  04/13/2023 11:54 AM

## 2023-04-13 NOTE — Progress Notes (Signed)
Patient has been transferred to HD, hand off report given to the unit RN.

## 2023-04-13 NOTE — Assessment & Plan Note (Addendum)
Echo demonstrated LVEF 50-55%, moderate LVH, RV mildly enlarged, LA and RA mildly dilated, and trivial MVR.  Received IV Lasix 80mg  initially in the ED. On lasix 40mg  daily at home. - Ted hose - Daily weights - Strict I/Os - Volume management per iHD

## 2023-04-13 NOTE — Consult Note (Signed)
NAME:  Zachary Parrish, MRN:  [id number], DOB:  [birth year], LOS: 15 ADMISSION DATE:  03/29/2023, CONSULTATION DATE: 04/13/2023 REFERRING MD: FMTS, CHIEF COMPLAINT: Pleural effusion  History of Present Illness:  83 year old male with prolonged hospital stay.  Briefly, initially presented to the emergency department 1/14 for exertional dyspnea, 4 pillow orthopnea.  Had a CT chest on admission showing a large right pleural effusion with pleural thickening and rind.  Pulmonary was asked to evaluate at that time.  He had a chest tube placed in the emergency department.  He had pleural fluid studies completed showing exudative effusion.  Thought that this was may be cardiorenal however cultures came back with MSSA consistent with Staph aureus empyema.  Unclear if this began as a pneumonia or seeded from his history of staph bacteremia.   Pertinent  Medical History  Paroxysmal atrial fibrillation, history of Staph aureus bacteremia, hypertension, gout, dyslipidemia, diabetes, CAD, CKD stage IV, CAD  Significant Hospital Events: Including procedures, antibiotic start and stop dates in addition to other pertinent events   1/14: Admit, chest tube 1/15: Staph aureus on pleural cultures, pleural lytics.  Transferred to ICU for hypotension ?  Septic shock, anuric 1/16: CRRT, vasopressors.  ID consulted and recommended cefazolin 1/17: Repeat tube lytics 1/20: Chest tube removed 1/26: Transfer out of ICU 1/28: 1 unit PRBC transfusion 1/29: RVR and cards recommending low-dose metoprolol.  Pulmonary reconsulted regarding increased cough, worsening chest x-ray  Interim History / Subjective:  Patient is alert, oriented, somewhat tired appearing. On 3LNC. He does not subjectively feel short of breath or have chest pain. Wife is at bedside.   Objective   Blood pressure (!) 137/51, pulse 88, temperature 98 F (36.7 C), temperature source Oral, resp. rate 17, height 6\' 1"  (1.854 m), weight 100.5 kg, SpO2 98%.         Intake/Output Summary (Last 24 hours) at 04/13/2023 1032 Last data filed at 04/13/2023 0600 Gross per 24 hour  Intake 2017.13 ml  Output --  Net 2017.13 ml   Filed Weights   04/11/23 2337 04/12/23 0500 04/13/23 0500  Weight: 94.7 kg 94.7 kg 100.5 kg    Examination: General: Elderly male, acute on chronically ill-appearing, laying in bed on 3 L nasal cannula, no significant distress HENT: [place]/AT, anicteric sclera, mucous membranes dry, nasal cannula Lungs: Diminished lung sounds on the right with rhonchi, productive cough, 3 L nasal cannula, no respiratory distress Cardiovascular: Irregularly irregular rhythm, no murmur, rub, gallop, no pitting edema Abdomen: Rounded, soft Extremities: No pitting edema Neuro: Alert, oriented, tired appearing GU: No Foley  Resolved Hospital Problem list    Assessment & Plan:  Right lung empyema and pneumonia, MSSA s/p chest tube and lytics; CT removed 1/21  Trapped lung  Reaccumulation of pleural effusion  Initial presentation with hypoxic respiratory failure, months of worsening exertional dyspnea, orthopnea.  Initial CT with large right-sided pleural effusion with a rind consistent with empyema.  Had chest tube placement and pleural fluid which was positive for MSSA.  Antibiotic course including ceftriaxone, azithromycin, linezolid, cefazolin.  Had initial chest tube from 1/14 - 1/20. Reported increasing white blood cell count, worsening cough on 04/13/2023.  Pulmonary reconsulted at this time. -Chest x-ray with bilateral pleural effusions, right greater than left.  Comparing this to 1/24 chest x-ray, not significantly worse -no indicate today for repeat chest tube placement  -f/u RVP, repeat sputum culture  -100g albumin to be given prior to dialysis ordered. Likely cardiorenal component of ongoing bilateral effusions. Albumin  is 1.9. will need osmotic pull to help offload fluid during dialysis sessions to optimize fluid balance. Encourage repeating  this prior to each dialysis session if hypoalbuminemia on cmp  -appreciate ID input for ongoing antibiotic and duration  - O2 to maintain SpO2 >90%  -reconsult for worsening o2 requirements or respiratory distress   Below per primary: Atrial fibrillation with intermittent RVR Acute on chronic renal failure Anemia of chronic disease Acute diastolic heart failure Type 2 diabetes Malnutrition   Best Practice (right click and "Reselect all SmartList Selections" daily)  Below per primary Diet/type: NPO DVT prophylaxis: systemic heparin Pressure ulcer(s): Stage II sacral GI prophylaxis: N/A Lines: Dialysis Catheter Foley:  N/A Code Status:  full code Last date of multidisciplinary goals of care discussion [Per primary]  Labs   CBC: Recent Labs  Lab 04/08/23 0237 04/08/23 2203 04/11/23 0410 04/12/23 0506 04/12/23 1441 04/13/23 0146  WBC 27.3* 24.1* 14.7* 11.5*  --  13.7*  HGB 6.9* 7.4* 7.1* 6.5* 8.1* 8.0*  HCT 20.6* 21.8* 21.4* 20.0* 23.3* 24.5*  MCV 100.5* 97.8 100.5* 99.0  --  97.2  PLT 281 293 268 230  --  232    Basic Metabolic Panel: Recent Labs  Lab 04/10/23 0357 04/10/23 1600 04/11/23 0410 04/12/23 0506 04/13/23 0146 04/13/23 0424  NA 126* 124* 125* 126* 125*  --   K 5.7* 6.1* 4.1 4.6 4.9  --   CL 87* 85* 89* 87* 85*  --   CO2 24 23 27 26 24   --   GLUCOSE 145* 258* 172* 224* 157*  --   BUN 118* 135* 68* 118* 141*  --   CREATININE 5.83* 6.34* 3.58* 5.27* 6.23*  --   CALCIUM 8.1* 7.9* 7.4* 7.7* 7.9*  --   MG 2.4  --  2.0 2.1 2.2 2.3  PHOS 7.0* 7.2* 4.7* 4.5 6.2*  --    GFR: Estimated Creatinine Clearance: 11.6 mL/min (A) (by C-G formula based on SCr of 6.23 mg/dL (H)). Recent Labs  Lab 04/08/23 2203 04/11/23 0410 04/12/23 0506 04/13/23 0146  WBC 24.1* 14.7* 11.5* 13.7*    Liver Function Tests: Recent Labs  Lab 04/07/23 2201 04/08/23 0237 04/10/23 0357 04/10/23 1600 04/11/23 0410 04/12/23 0506 04/13/23 0146  AST 43*  --   --   --   --    --   --   ALT <5  --   --   --   --   --   --   ALKPHOS 86  --   --   --   --   --   --   BILITOT 0.4  --   --   --   --   --   --   PROT 6.1*  --   --   --   --   --   --   ALBUMIN 1.9*   < > 1.9* 1.9* 1.8* 1.8* 1.9*   < > = values in this interval not displayed.   No results for input(s): "LIPASE", "AMYLASE" in the last 168 hours. No results for input(s): "AMMONIA" in the last 168 hours.  ABG    Component Value Date/Time   PHART 7.295 (L) 05/31/2019 2340   PCO2ART 30.2 (L) 05/31/2019 2340   PO2ART 82.0 (L) 05/31/2019 2340   HCO3 14.7 (L) 05/31/2019 2340   TCO2 28 03/29/2023 1017   ACIDBASEDEF 11.0 (H) 05/31/2019 2340   O2SAT 63.3 04/06/2023 1000     Coagulation Profile: No results for input(s): "  INR", "PROTIME" in the last 168 hours.  Cardiac Enzymes: No results for input(s): "CKTOTAL", "CKMB", "CKMBINDEX", "TROPONINI" in the last 168 hours.  HbA1C: Hgb A1c MFr Bld  Date/Time Value Ref Range Status  03/29/2023 06:32 PM 6.6 (H) 4.8 - 5.6 % Final    Comment:    (NOTE) Pre diabetes:          5.7%-6.4%  Diabetes:              >6.4%  Glycemic control for   <7.0% adults with diabetes   06/01/2019 03:39 AM 7.2 (H) 4.8 - 5.6 % Final    Comment:    (NOTE) Pre diabetes:          5.7%-6.4% Diabetes:              >6.4% Glycemic control for   <7.0% adults with diabetes     CBG: Recent Labs  Lab 04/12/23 0604 04/12/23 1201 04/12/23 1650 04/12/23 2202 04/13/23 0601  GLUCAP 215* 165* 147* 148* 168*    Review of Systems:   As above  Past Medical History:  He,  has a past medical history of Acute renal failure (ARF) (HCC) (05/31/2019), B12 deficiency (08/02/2019), Bilateral carotid artery stenosis (05/31/2021), CAD (coronary artery disease) (2014), Chronic kidney disease, stage IV (severe) (HCC) (07/19/2019), Coronary artery disease involving native coronary artery of native heart without angina pectoris (07/19/2019), DM (diabetes mellitus) (HCC), Dyslipidemia  (11/22/2019), Essential hypertension (07/19/2019), Gout, Gouty arthritis (09/06/2019), HTN (hypertension), MSSA (methicillin susceptible Staphylococcus aureus) septicemia (HCC) (06/09/2012), Nonrheumatic aortic valve insufficiency (07/19/2019), OSA (obstructive sleep apnea) (04/19/2013), PAF (paroxysmal atrial fibrillation) (HCC) (06/03/2019), Septic shock (HCC) (05/31/2019), and Shock (HCC) (05/31/2019).   Surgical History:   Past Surgical History:  Procedure Laterality Date   CARDIAC CATHETERIZATION  2014   CARDIOVERSION N/A 06/07/2019   Procedure: CARDIOVERSION;  Surgeon: Little Ishikawa, MD;  Location: [facility] ENDOSCOPY;  Service: Cardiovascular;  Laterality: N/A;   TEE WITHOUT CARDIOVERSION N/A 06/07/2019   Procedure: TRANSESOPHAGEAL ECHOCARDIOGRAM (TEE);  Surgeon: Little Ishikawa, MD;  Location: [facility] ENDOSCOPY;  Service: Cardiovascular;  Laterality: N/A;     Social History:   reports that he has quit smoking. He uses smokeless tobacco. He reports current alcohol use. He reports that he does not use drugs.   Family History:  His family history includes Heart attack in his father; Osteoporosis in his mother.   Allergies Allergies  Allergen Reactions   Allopurinol     Possible DRESS syndrome   Colchicine     Possible DRESS syndrome     Home Medications  Prior to Admission medications   Medication Sig Start Date End Date Taking? Authorizing Provider  aspirin 81 MG chewable tablet Chew 162 mg by mouth daily.   Yes [provider]  atorvastatin (LIPITOR) 40 MG tablet Take 40 mg by mouth daily. 03/27/19  Yes [provider]  carvedilol (COREG) 25 MG tablet Take 1 tablet (25 mg total) by mouth 2 (two) times daily. Hold dose if heart rate is less than 50 06/07/19  Yes Kyle, Tyrone A, DO  Coenzyme Q10 300 MG CAPS Take 1 capsule by mouth daily.   Yes [provider]  Cyanocobalamin (B-12 PO) Take by mouth.   Yes [provider]  furosemide  (LASIX) 40 MG tablet Take 40 mg by mouth daily.    Yes [provider]  Multiple Vitamins-Minerals (OCUVITE PO) Take 1 capsule by mouth at bedtime.   Yes [provider]  Omega-3 Fatty Acids (  FISH OIL) 1000 MG CAPS Take 2,000 capsules by mouth 2 (two) times daily.   Yes [provider]  potassium chloride (MICRO-K) 10 MEQ CR capsule Take 20 mEq by mouth 2 (two) times daily.   Yes [provider]  telmisartan (MICARDIS) 40 MG tablet Take 40 mg by mouth daily.   Yes [provider]     Critical care time: 77    Lenard Galloway [facility] 04/13/23 12:37 PM  Please see [email or web address] for pager details.  From 7A-7P if no response, please call [phone] After hours, please call ELink [phone]

## 2023-04-13 NOTE — Assessment & Plan Note (Addendum)
Cortrak in place, TF initiated 1/17. TF rate decreased to 20 ml/hr by Provider (not in order in [facility]) to see if appetite will improve 1/21. On 1/22, Episode of emesis after eating greasy sausage; abd xray with Cortrak tip in stomach, unremarkable bowel gas pattern. On 1/23, dysphagia 3/thin diet recommended by SLP. Made NPO 1/27 due to increased weakness with chewing foods.  - SLP following - Zofran 4mg  q6h PRN nausea/vomiting - Decrease Reglan to 5mg  q8h (goal to discontinue)

## 2023-04-13 NOTE — Assessment & Plan Note (Addendum)
Likely 2/2 deconditioning in addition to prolonged hospital stay and illness. - PT/OT eval/treat

## 2023-04-13 NOTE — Plan of Care (Signed)
  Problem: Clinical Measurements: Goal: Will remain free from infection Outcome: Progressing   Problem: Coping: Goal: Level of anxiety will decrease Outcome: Progressing   Problem: Fluid Volume: Goal: Ability to maintain a balanced intake and output will improve Outcome: Progressing   Problem: Nutritional: Goal: Maintenance of adequate nutrition will improve Outcome: Progressing Goal: Progress toward achieving an optimal weight will improve Outcome: Progressing

## 2023-04-13 NOTE — Plan of Care (Addendum)
FMTS Interim Progress Note  S: Went to bedside with Dr. Fatima Blank for concern of elevated heart rates in patient, currently rate control with amiodarone via tube. Patient denies chest pain, shortness of breath, swelling. Reports that he feels no different from normal.  O: BP (!) 154/69 (BP Location: Left Arm)   Pulse (!) 116   Temp 97.6 F (36.4 C) (Axillary)   Resp (!) 27   Ht 6\' 1"  (1.854 m)   Wt 94.7 kg   SpO2 100%   BMI 27.54 kg/m   General: NAD  Card: Irregular rate and tachy  Resp: Coarse and crackles diffusely throughout, transmitted upper airway sounds  LE: No swelling noted   A/P: Afib with RVR  -Rate uncontrolled, does not meet lenient goal of <110. Discussed possibility of metoprolol as an option given that the patient is already receiving amio via tube and has normal heart function. Patient does have a history this hospitalization initially receiving Metoprolol and having soft pressures afterward. Ran by cardiology on call, decided to continue with low dose Metoprolol tartrate 12.5 mg given blood pressure is stable presently if HR continue to persist in the 120s-130s. Consider IV metop if HR sustains >150s. Monitor BP closely after administration with low threshold for CCM involvement if indicated. Will have day team re-engage cardiology tomorrow for further rate control assistance. EKG with new elevated T waves in V1, V2, V3, V4 but no ST findings of concern. Afib with RVR to 132 on EKG. Ordered RFP, CBC, Trop and CXR as patient is also experiencing worsening cough and URI symptoms per nursing. Ordered IS and pulmonary toiletry.    ESRD on HD per nephrology.   Appreciate cardiology assistance, will discuss with day team on hand off.   EDIT: Patient with softer pressures prior to administration of metoprolol, held the medication due to change in BP. HR varies greatly but maintaining in 110-120 intermittently. Hemodynamically stable. CTM. Consider giving amiodarone dose early if  begins to worsen.   Alfredo Martinez, MD 04/13/2023, 1:31 AM PGY-3, [facility] Family Medicine Service pager [phone]

## 2023-04-13 NOTE — Significant Event (Signed)
Rapid Response Event Note   Reason for Call :  Alerted mental status post dialysis and right sided facial droop  Initial Focused Assessment:  Patient alert, sitting up in bed. Skin warm and clammy. Productive congested weak cough. Patient unable to tell me his name. Moves all extremities, no facial droop noted, aphasic, mild dysarthria, NIH 15. LKW 14:50. Per RN patient was interactive and walked with PT yesterday. Attending and Neuro at bedside.   BP- 131/58 HR- 91 RR-23 O2- 97% Temp-100.4   Interventions:  Rectal temp 100.4 CBG 179 NIH 15 Heparin shut off per MD CT head ordered  Deep oral suctioning  Plan of Care:  Plan for a head CT. If no bleed present heparin gtt may be restarted. Mental status change may be due to elevated temperature, treat temp then reevaluate mental status.    Event Summary:   MD Notified:  Dr. Jena Gauss and Dr. Lily Kocher at bedside on arrival Call Time: 19:01 Arrival Time: 19:14 End Time: 19:50  Nechama Guard, RN

## 2023-04-13 NOTE — Progress Notes (Signed)
Sputum specimen could not be collected because patient was not able to expectorate.

## 2023-04-13 NOTE — Progress Notes (Signed)
   04/13/23 1840  Vitals  Temp 97.8 F (36.6 C)  Pulse Rate 100  Resp 18  BP (!) 140/65  SpO2 99 %  O2 Device Nasal Cannula  Oxygen Therapy  O2 Flow Rate (L/min) 3 L/min  Pulse Oximetry Type Continuous  Post Treatment  Dialyzer Clearance Lightly streaked  Liters Processed 63.8  Fluid Removed (mL) 1000 mL   Received patient in bed to unit.  Informed consent signed and in chart.   TX duration:3.15  Transported back to the room by two dialysis RN and verbally responding to some question. Handoff given at patient bedside. Primary Nurse states patient was more interactive this morning. Inform nurse patient was oriented to person but disoriented to place, situation, and time pre HD and not answering questions well. Rapid response initiated by Primary Nurse.  Hand-off given to patient's nurse.  Access used: R CVC Access issues: None  Total UF removed: Medication(s) given: See MAR    Laqueta Due, RN [facility]

## 2023-04-13 NOTE — Progress Notes (Addendum)
Occupational Therapy Treatment Patient Details Name: Zachary Parrish MRN: [id number] DOB: [birth year] Today's Date: 04/13/2023   History of present illness Mr. Zachary Parrish is an 83 year old gentleman admitted 1/14 presenting with greater than 2 months of progressive shortness of breath and leg edema. Over the last 2 weeks he has felt worse such that he is short of breath even at rest  He has been sleeping sitting upright sometimes in his car to prop him up enough. Pt also with afib. Pt with chest tube placed 1/14 due to pleural effusion. Pt  found to have AKI d/t ischemic ATN r/t septic shock with cultures growing staph aureus at chest tube 1/16. Pt transferred to ICU and CRRT initiated 1/16. PMH: A-fib many years ago, status post ablation and not recurrent so not on anticoagulation, CKD 3B   OT comments  Limited progress due to weakness and decreased activity tolerance. Patient agreeable to work on EOB sitting balance with max assist to get to EOB. Patient demonstrating strong posterior leaning requiring max assist for sitting balance and progressed to mod assist with forward flexion and lateral leaning. Patient tolerated >8 minutes seated on EOB before returning to supine with max assist. Patient will benefit from continued inpatient follow up therapy, <3 hours/day. Acute OT to continue to follow to address established goals to facilitate DC to next venue of care.       If plan is discharge home, recommend the following:  Two people to help with walking and/or transfers;Two people to help with bathing/dressing/bathroom;Assistance with cooking/housework;Assistance with feeding;Direct supervision/assist for medications management;Direct supervision/assist for financial management;Assist for transportation;Help with stairs or ramp for entrance;Supervision due to cognitive status   Equipment Recommendations  Other (comment) (defer to next venue of care)    Recommendations for Other Services      Precautions /  Restrictions Precautions Precautions: Fall;Other (comment) Precaution Comments: watch BP and O2 Restrictions Weight Bearing Restrictions Per Provider Order: No       Mobility Bed Mobility Overal bed mobility: Needs Assistance Bed Mobility: Supine to Sit, Sit to Supine Rolling: Max assist   Supine to sit: Max assist, HOB elevated Sit to supine: Max assist   General bed mobility comments: Patient required assistance with BLEs and trunk to get to EOB with HOB raised, max to total assist to return to supine due to fatigue    Transfers Overall transfer level: Needs assistance                 General transfer comment: not attempted     Balance Overall balance assessment: Needs assistance Sitting-balance support: Bilateral upper extremity supported, Feet supported Sitting balance-Leahy Scale: Poor Sitting balance - Comments: max assist due to posterior leaning Postural control: Posterior lean                                 ADL either performed or assessed with clinical judgement   ADL Overall ADL's : Needs assistance/impaired     Grooming: Wash/dry hands;Wash/dry face;Set up;Bed level                                 General ADL Comments: focused on bed mobility and EOB sitting balance    Extremity/Trunk Assessment              Vision       Perception     Praxis  Cognition Arousal: Alert Behavior During Therapy: WFL for tasks assessed/performed Overall Cognitive Status: Impaired/Different from baseline Area of Impairment: Safety/judgement, Problem solving, Awareness                   Current Attention Level: Focused Memory: Decreased short-term memory Following Commands: Follows one step commands with increased time Safety/Judgement: Decreased awareness of safety Awareness: Emergent Problem Solving: Requires verbal cues General Comments: alert but requiring increased time to follow directions         Exercises      Shoulder Instructions       General Comments at end of session BP 143/58 (81), HR 107, SpO2 96% on 2 liters    Pertinent Vitals/ Pain       Pain Assessment Pain Assessment: Faces Faces Pain Scale: Hurts little more Pain Location: grimacing while seated on EOB Pain Descriptors / Indicators: Grimacing, Sore Pain Intervention(s): Limited activity within patient's tolerance, Monitored during session, Repositioned  Home Living                                          Prior Functioning/Environment              Frequency  Min 1X/week        Progress Toward Goals  OT Goals(current goals can now be found in the care plan section)  Progress towards OT goals: Progressing toward goals  Acute Rehab OT Goals Patient Stated Goal: not stated OT Goal Formulation: With patient Time For Goal Achievement: 04/15/23 Potential to Achieve Goals: Good ADL Goals Pt Will Perform Upper Body Bathing: with min assist;sitting Pt Will Perform Lower Body Bathing: with mod assist;sit to/from stand;sitting/lateral leans Pt Will Perform Lower Body Dressing: with mod assist;sitting/lateral leans;sit to/from stand Pt Will Transfer to Toilet: with mod assist;bedside commode;ambulating Pt Will Perform Toileting - Clothing Manipulation and hygiene: with mod assist;sit to/from stand;sitting/lateral leans  Plan      Co-evaluation                 AM-PAC OT "6 Clicks" Daily Activity     Outcome Measure   Help from another person eating meals?: A Little Help from another person taking care of personal grooming?: A Lot Help from another person toileting, which includes using toliet, bedpan, or urinal?: Total Help from another person bathing (including washing, rinsing, drying)?: A Lot Help from another person to put on and taking off regular upper body clothing?: A Lot Help from another person to put on and taking off regular lower body clothing?: Total 6  Click Score: 11    End of Session Equipment Utilized During Treatment: Oxygen  OT Visit Diagnosis: Other abnormalities of gait and mobility (R26.89);Muscle weakness (generalized) (M62.81);Ataxia, unspecified (R27.0);Other symptoms and signs involving cognitive function;Other (comment) (decreased activity tolerance)   Activity Tolerance Patient tolerated treatment well;Patient limited by fatigue   Patient Left in bed;with call bell/phone within reach;with bed alarm set;with family/visitor present   Nurse Communication Mobility status        Time: 1610-9604 OT Time Calculation (min): 30 min  Charges: OT General Charges $OT Visit: 1 Visit OT Treatments $Self Care/Home Management : 8-22 mins $Therapeutic Activity: 8-22 mins  Alfonse Flavors, OTA Acute Rehabilitation Services  Office [phone]   Dewain Penning 04/13/2023, 2:24 PM

## 2023-04-13 NOTE — Assessment & Plan Note (Addendum)
Hemoglobin stable at 8 this morning after receiving 1U PRBC yesterday. Likely in the setting of CKD. Receives ESA outpatient if Hgb <10.  - am CBC - transfuse if <7

## 2023-04-13 NOTE — Plan of Care (Signed)
FMTS Interim Progress Note  S: Patient seen at bedside with rapid and Dr. Fatima Blank, nursing staff also present. Unable to answer questions, intermittently shakes head yes to questioning. Neurology presented as well.   O: BP (!) 131/58 (BP Location: Left Arm)   Pulse 91   Temp (!) 100.4 F (38 C) (Rectal)   Resp (!) 23   Ht 6\' 1"  (1.854 m)   Wt 100.5 kg   SpO2 97%   BMI 29.23 kg/m   Neuro: CN II: PERRL CN III, IV,VI: EOMI CVII: Symmetric smile and brow raise Patient responds to pain, reacts to voice but unable to answer Does not follow commands    A/P: Change in Mentation, resolved:  -Seen by neurology, CT head negative. Restarted heparin drip  -On second check, patient had cognitive change resolve.  -Reassess as indicated  -Suspect delirium, possibly with hypotensive episode in HD  -VBG negative   Fever, unknown source  -On cefazolin, expand to Zosyn and Vanc with pharmacy discussion and consult, de-escalate when able  -bcx/UA with reflex to cx/CXR -Tylenol for fever control  -CTM tele and oxygen sat    Alfredo Martinez, MD 04/13/2023, 8:17 PM PGY-3, [facility] Family Medicine Service pager [phone]

## 2023-04-13 NOTE — Assessment & Plan Note (Addendum)
Rates uncontrolled overnight up to 130s, also EKG with elevated T waves. Cardiology fellow spoken to overnight and recommended metoprolol tartrate 12.5mg . However began having soft pressures after metoprolol so it was held. Cardiology consulted for assistance with rate control given his complexity. - continue Amiodarone - continue Heparin - transition to DOAC when able - need to see when nephrology is planning permanent access - Cardiac monitoring - Cardiology considering TEE/DCCV when stabilized/on stable AC (prior to d/c or outpatient)

## 2023-04-14 DIAGNOSIS — R404 Transient alteration of awareness: Secondary | ICD-10-CM

## 2023-04-14 DIAGNOSIS — R4182 Altered mental status, unspecified: Secondary | ICD-10-CM

## 2023-04-14 DIAGNOSIS — Z7409 Other reduced mobility: Secondary | ICD-10-CM

## 2023-04-14 DIAGNOSIS — E440 Moderate protein-calorie malnutrition: Secondary | ICD-10-CM | POA: Diagnosis not present

## 2023-04-14 DIAGNOSIS — A4901 Methicillin susceptible Staphylococcus aureus infection, unspecified site: Secondary | ICD-10-CM | POA: Diagnosis not present

## 2023-04-14 DIAGNOSIS — J869 Pyothorax without fistula: Secondary | ICD-10-CM | POA: Diagnosis not present

## 2023-04-14 LAB — GLUCOSE, CAPILLARY
Glucose-Capillary: 227 mg/dL — ABNORMAL HIGH (ref 70–99)
Glucose-Capillary: 191 mg/dL — ABNORMAL HIGH (ref 70–99)
Glucose-Capillary: 204 mg/dL — ABNORMAL HIGH (ref 70–99)
Glucose-Capillary: 146 mg/dL — ABNORMAL HIGH (ref 70–99)

## 2023-04-14 LAB — RENAL FUNCTION PANEL
Albumin: 1.9 g/dL — ABNORMAL LOW (ref 3.5–5.0)
Anion gap: 14 (ref 5–15)
BUN: 94 mg/dL — ABNORMAL HIGH (ref 8–23)
CO2: 26 mmol/L (ref 22–32)
Calcium: 7.8 mg/dL — ABNORMAL LOW (ref 8.9–10.3)
Chloride: 90 mmol/L — ABNORMAL LOW (ref 98–111)
Creatinine, Ser: 4.41 mg/dL — ABNORMAL HIGH (ref 0.61–1.24)
GFR, Estimated: 13 mL/min — ABNORMAL LOW (ref >60)
Glucose, Bld: 217 mg/dL — ABNORMAL HIGH (ref 70–99)
Phosphorus: 3.2 mg/dL (ref 2.5–4.6)
Potassium: 4 mmol/L (ref 3.5–5.1)
Sodium: 130 mmol/L — ABNORMAL LOW (ref 135–145)

## 2023-04-14 LAB — CBC
HCT: 21.2 % — ABNORMAL LOW (ref 39.0–52.0)
Hemoglobin: 7 g/dL — ABNORMAL LOW (ref 13.0–17.0)
MCH: 32.3 pg (ref 26.0–34.0)
MCHC: 33 g/dL (ref 30.0–36.0)
MCV: 97.7 fL (ref 80.0–100.0)
Platelets: 181 10*3/uL (ref 150–400)
RBC: 2.17 MIL/uL — ABNORMAL LOW (ref 4.22–5.81)
RDW: 18.6 % — ABNORMAL HIGH (ref 11.5–15.5)
WBC: 11.7 10*3/uL — ABNORMAL HIGH (ref 4.0–10.5)
nRBC: 0.2 % (ref 0.0–0.2)

## 2023-04-14 LAB — HEPARIN LEVEL (UNFRACTIONATED)
Heparin Unfractionated: 0.19 [IU]/mL — ABNORMAL LOW (ref 0.30–0.70)
Heparin Unfractionated: 0.1 [IU]/mL — ABNORMAL LOW (ref 0.30–0.70)

## 2023-04-14 LAB — RESP PANEL BY RT-PCR (RSV, FLU A&B, COVID)  RVPGX2
Influenza A by PCR: NEGATIVE
Influenza B by PCR: NEGATIVE
Resp Syncytial Virus by PCR: NEGATIVE
SARS Coronavirus 2 by RT PCR: NEGATIVE

## 2023-04-14 LAB — MAGNESIUM: Magnesium: 2 mg/dL (ref 1.7–2.4)

## 2023-04-14 LAB — MRSA NEXT GEN BY PCR, NASAL: MRSA by PCR Next Gen: NOT DETECTED

## 2023-04-14 MED ORDER — OXYCODONE HCL 5 MG PO TABS: 2.5000 mg | ORAL_TABLET | ORAL | Status: DC | PRN

## 2023-04-14 NOTE — Assessment & Plan Note (Addendum)
Cortrak in place, TF initiated 1/17. TF rate decreased to 20 ml/hr by Provider (not in order in [facility]) to see if appetite will improve 1/21. On 1/22, Episode of emesis after eating greasy sausage; abd xray with Cortrak tip in stomach, unremarkable bowel gas pattern. On 1/23, dysphagia 3/thin diet recommended by SLP. Made NPO 1/27 due to increased weakness with chewing foods.  - SLP following - Zofran 4mg  q6h PRN nausea/vomiting - Decrease Reglan to 5mg  q8h (goal to discontinue)

## 2023-04-14 NOTE — Progress Notes (Addendum)
Patient ID: Jhonnie Aliano, male   DOB: Aug 10, 1940, 83 y.o.   MRN: [id number] S: Code stroke called last night after HD due to AMS and facial droop.  CT scan negative.  Neurology evaluated pt and did not find focal neuro deficit.  He is back to baseline this am.  O:BP 109/81 (BP Location: Left Arm)   Pulse 93   Temp 98.7 F (37.1 C) (Oral)   Resp (!) 47   Ht 6\' 1"  (1.854 m)   Wt 100.6 kg   SpO2 100%   BMI 29.26 kg/m   Intake/Output Summary (Last 24 hours) at 04/14/2023 1318 Last data filed at 04/14/2023 0940 Gross per 24 hour  Intake 2257.47 ml  Output 1000 ml  Net 1257.47 ml   Intake/Output: I/O last 3 completed shifts: In: 3208.3 [I.V.:558.3; NG/GT:2150; IV Piggyback:500] Out: 1000 [Other:1000]  Intake/Output this shift:  Total I/O In: 484.1 [I.V.:102.2; NG/GT:333.3; IV Piggyback:48.6] Out: -  Weight change: 0 kg Gen: chronically ill-appearing CVS: RRR Resp: occ rhonchi Abd: +BS, soft, NT/ND Ext: trace pretibial edema  Recent Labs  Lab 04/07/23 2201 04/08/23 0237 04/09/23 1843 04/10/23 0357 04/10/23 1600 04/11/23 0410 04/12/23 0506 04/13/23 0146 04/14/23 0630  NA 123*   < > 126* 126* 124* 125* 126* 125* 130*  K 5.8*   < > 5.5* 5.7* 6.1* 4.1 4.6 4.9 4.0  CL 89*   < > 88* 87* 85* 89* 87* 85* 90*  CO2 22   < > 26 24 23 27 26 24 26   GLUCOSE 190*   < > 208* 145* 258* 172* 224* 157* 217*  BUN 115*   < > 103* 118* 135* 68* 118* 141* 94*  CREATININE 5.65*   < > 5.09* 5.83* 6.34* 3.58* 5.27* 6.23* 4.41*  ALBUMIN 1.9*   < > 2.0* 1.9* 1.9* 1.8* 1.8* 1.9* 1.9*  CALCIUM 7.9*   < > 8.0* 8.1* 7.9* 7.4* 7.7* 7.9* 7.8*  PHOS  --    < > 6.3* 7.0* 7.2* 4.7* 4.5 6.2* 3.2  AST 43*  --   --   --   --   --   --   --   --   ALT <5  --   --   --   --   --   --   --   --    < > = values in this interval not displayed.   Liver Function Tests: Recent Labs  Lab 04/07/23 2201 04/08/23 0237 04/12/23 0506 04/13/23 0146 04/14/23 0630  AST 43*  --   --   --   --   ALT <5  --   --   --    --   ALKPHOS 86  --   --   --   --   BILITOT 0.4  --   --   --   --   PROT 6.1*  --   --   --   --   ALBUMIN 1.9*   < > 1.8* 1.9* 1.9*   < > = values in this interval not displayed.   No results for input(s): "LIPASE", "AMYLASE" in the last 168 hours. No results for input(s): "AMMONIA" in the last 168 hours. CBC: Recent Labs  Lab 04/08/23 2203 04/11/23 0410 04/12/23 0506 04/12/23 1441 04/13/23 0146 04/14/23 0630  WBC 24.1* 14.7* 11.5*  --  13.7* 11.7*  HGB 7.4* 7.1* 6.5* 8.1* 8.0* 7.0*  HCT 21.8* 21.4* 20.0* 23.3* 24.5* 21.2*  MCV 97.8 100.5* 99.0  --  97.2 97.7  PLT 293 268 230  --  232 181   Cardiac Enzymes: No results for input(s): "CKTOTAL", "CKMB", "CKMBINDEX", "TROPONINI" in the last 168 hours. CBG: Recent Labs  Lab 04/13/23 1837 04/13/23 1904 04/13/23 2129 04/14/23 0640 04/14/23 1132  GLUCAP 164* 179* 172* 227* 191*    Iron Studies: No results for input(s): "IRON", "TIBC", "TRANSFERRIN", "FERRITIN" in the last 72 hours. Studies/Results: DG Chest 1 View Result Date: 04/14/2023 CLINICAL DATA:  Cough and shortness of breath EXAM: PORTABLE CHEST 1 VIEW COMPARISON:  Film from earlier in the same day. FINDINGS: Cardiac shadow is enlarged. Feeding catheter is noted extending into the stomach. Left jugular temporary dialysis catheter is noted. Bilateral pleural effusions are noted right greater than left. No new focal infiltrate or effusion is seen. No bony abnormality is noted. IMPRESSION: Effusions right greater than left stable from the prior exam. Tubes and lines as described. Electronically Signed   By: Alcide Clever M.D.   On: 04/14/2023 00:00   CT HEAD WO CONTRAST ( ) Result Date: 04/13/2023 CLINICAL DATA:  Mental status change, unknown cause EXAM: CT HEAD WITHOUT CONTRAST TECHNIQUE: Contiguous axial images were obtained from the base of the skull through the vertex without intravenous contrast. RADIATION DOSE REDUCTION: This exam was performed according to the  departmental dose-optimization program which includes automated exposure control, adjustment of the mA and/or kV according to patient size and/or use of iterative reconstruction technique. COMPARISON:  None Available. FINDINGS: Motion limited study.  Within this limitation: Brain: No evidence of acute infarction, hemorrhage, hydrocephalus, extra-axial collection or mass lesion/mass effect. Cerebral atrophy. Vascular: No hyperdense vessel. Skull: No acute fracture. Sinuses/Orbits: Left maxillary sinus, anterior left ethmoid air cell, and left frontal sinus mucosal thickening. No acute orbital findings. Other: No mastoid effusions. IMPRESSION: No evidence of acute intracranial abnormality on motion limited assessment. Electronically Signed   By: Feliberto Harts M.D.   On: 04/13/2023 21:11   DG Chest 1 View Result Date: 04/13/2023 CLINICAL DATA:  Shortness of breath, tachycardia EXAM: CHEST  1 VIEW COMPARISON:  04/08/2023 FINDINGS: Left dialysis catheter and feeding tube remain in place, unchanged. Cardiomegaly, vascular congestion. Bilateral pleural effusions, right greater than left. Bibasilar airspace opacities, favor atelectasis. No real change since prior study. IMPRESSION: Cardiomegaly, vascular congestion. Bilateral pleural effusions, right greater than left with bibasilar atelectasis. Electronically Signed   By: Charlett Nose M.D.   On: 04/13/2023 10:26    sodium chloride   Intravenous Once   amiodarone  400 mg Per Tube BID   aspirin  81 mg Per Tube Daily   atorvastatin  40 mg Per Tube Daily   Chlorhexidine Gluconate Cloth  6 each Topical Daily   Chlorhexidine Gluconate Cloth  6 each Topical Q0600   darbepoetin (ARANESP) injection - DIALYSIS  60 mcg Subcutaneous Q Wed-1800   feeding supplement (PROSource TF20)  60 mL Per Tube TID   folic acid  1 mg Per Tube Daily   Gerhardt's butt cream   Topical TID   insulin aspart  0-5 Units Subcutaneous QHS   insulin aspart  0-9 Units Subcutaneous TID WC    lidocaine  1 patch Transdermal Q24H   metoCLOPramide (REGLAN) injection  5 mg Intravenous Q8H   midodrine  15 mg Per Tube TID WC   multivitamin  1 tablet Oral BID   nicotine  21 mg Transdermal Daily   mouth rinse  15 mL Mouth Rinse 4 times per day   thiamine  100 mg Per Tube  Daily    BMET    Component Value Date/Time   NA 130 (L) 04/14/2023 0630   K 4.0 04/14/2023 0630   CL 90 (L) 04/14/2023 0630   CO2 26 04/14/2023 0630   GLUCOSE 217 (H) 04/14/2023 0630   BUN 94 (H) 04/14/2023 0630   CREATININE 4.41 (H) 04/14/2023 0630   CALCIUM 7.8 (L) 04/14/2023 0630   GFRNONAA 13 (L) 04/14/2023 0630   GFRAA 30 (L) 06/07/2019 0321   CBC    Component Value Date/Time   WBC 11.7 (H) 04/14/2023 0630   RBC 2.17 (L) 04/14/2023 0630   HGB 7.0 (L) 04/14/2023 0630   HCT 21.2 (L) 04/14/2023 0630   PLT 181 04/14/2023 0630   MCV 97.7 04/14/2023 0630   MCH 32.3 04/14/2023 0630   MCHC 33.0 04/14/2023 0630   RDW 18.6 (H) 04/14/2023 0630   LYMPHSABS 0.4 (L) 03/30/2023 0251   MONOABS 1.1 (H) 03/30/2023 0251   EOSABS 0.0 03/30/2023 0251   BASOSABS 0.0 03/30/2023 0251     Pt is a 83 y.o. yo male  with PMH significant for hypertension, type II DM, CAD, A-fib, CKD (f/b Dr. Glenna Fellows) was admitted with shortness of breath, seen as a consultation for the evaluation and management of acute kidney injury.    Assessment/Plan:   AKI/CKD stage IV - ischemic ATN in setting of septic shock.  [place] without obstruction.  Refractory to diuretics and hypotension, started on CRRT 1/15-1/21/25 after temp HD catheter placed by PCCM.  Off of CRRT and had IHD last early yesterday am due to refractory hyperkalemia and anuria.  Will continue to follow for signs of renal recovery.  Will plan for HD again tomorrow and keep on MWF schedule for now.  I am concerned that he is not really improving and having issues tolerating HD.  I had a frank conversation with he and his wife regarding this and they are amenable to speaking with  Palliative care. Vascular access - Left internal jugular temp HD catheter placed 04/08/23 and will need TDC if he is to continue with HD.  Will consult IR today for possible [facility] either tomorrow, or can remove temp HD catheter tomorrow after HD and have TDC placed on Monday if they decide to proceed with HD.  Septic shock due to staph  bacteremia - per PCCM Staph aureus empyema/Right pleural effusion - s/p chest tube.  ID following and on cefazolin. Acute RV failure with CHF -  UF as tolerated with HD.   Chronic atrial fibrillation with RVR- possible DCCV when more stable.  On amiodarone and heparin drip.  Metoprolol 12.5 mg started last night.  Cardiology signed off.  Acute on chronic Anemia - transfuse for Hgb <7.  Received blood yesterday due to Hgb of 6.5.  Improved to 8 today.  Continue to follow. Will add ESA. Severe protein malnutrition - per primary Disposition - poor overall prognosis.  Recommend palliative care consult to set goals/limits of care.  He is a marginal HD candidate given advanced age, biventricular heart failure, hypotension, multiple co-morbidities, and poor functional and nutritional status.    Irena Cords, MD [facility]

## 2023-04-14 NOTE — Assessment & Plan Note (Addendum)
Ischemic ATN in setting of septic shock. US obtained with no sign of obstruction. Has had refractory hyperkalemia throughout admission. Received IHD last night due to refractory hyperkalemia and anuria (CRRT 1/15-1/21). Nephrology planning for dialysis MWF - Nephrology following, appreciate recs - stopped Lokelma 10g BID (previously refractory hyperkalemia, has been stable the last few days) - renal diet - Strict I/O - daily weights - am RFP, Mag

## 2023-04-14 NOTE — Progress Notes (Signed)
NAME:  Zachary Parrish, MRN:  [id number], DOB:  1940/07/29, LOS: 16 ADMISSION DATE:  03/29/2023, CONSULTATION DATE: 04/13/2023 REFERRING MD: FMTS, CHIEF COMPLAINT: Pleural effusion  History of Present Illness:  83 year old male with prolonged hospital stay.  Briefly, initially presented to the emergency department 1/14 for exertional dyspnea, 4 pillow orthopnea.  Had a CT chest on admission showing a large right pleural effusion with pleural thickening and rind.  Pulmonary was asked to evaluate at that time.  He had a chest tube placed in the emergency department.  He had pleural fluid studies completed showing exudative effusion.  Thought that this was may be cardiorenal however cultures came back with MSSA consistent with Staph aureus empyema.  Unclear if this began as a pneumonia or seeded from his history of staph bacteremia.    Pertinent  Medical History  Paroxysmal atrial fibrillation, history of Staph aureus bacteremia, hypertension, gout, dyslipidemia, diabetes, CAD, CKD stage IV, CAD  Significant Hospital Events: Including procedures, antibiotic start and stop dates in addition to other pertinent events   1/14: Admit, chest tube 1/15: Staph aureus on pleural cultures, pleural lytics.  Transferred to ICU for hypotension ?  Septic shock, anuric 1/16: CRRT, vasopressors.  ID consulted and recommended cefazolin 1/17: Repeat tube lytics 1/20: Chest tube removed 1/26: Transfer out of ICU 1/28: 1 unit PRBC transfusion 1/29: RVR and cards recommending low-dose metoprolol.  Pulmonary reconsulted regarding increased cough, worsening chest x-ray 1/30: tolerated HD yesterday.  However, preloading with albumin was not done due to scheduling miscommunication.  Interim History / Subjective:  Patient is alert, oriented, somewhat tired appearing. On 3LNC. He does not subjectively feel short of breath or have chest pain. Wife is at bedside.   Objective   Blood pressure 109/81, pulse 94, temperature 98.7  F (37.1 C), temperature source Oral, resp. rate 20, height 6\' 1"  (1.854 m), weight 100.6 kg, SpO2 99%.        Intake/Output Summary (Last 24 hours) at 04/14/2023 0815 Last data filed at 04/14/2023 0300 Gross per 24 hour  Intake 1923.33 ml  Output 1000 ml  Net 923.33 ml   Filed Weights   04/13/23 0500 04/13/23 1455 04/14/23 0318  Weight: 100.5 kg 100.5 kg 100.6 kg    Examination: General: Elderly male, acute on chronically ill-appearing, laying in bed on 3 L nasal cannula, no significant distress HENT: [place]/AT, anicteric sclera, mucous membranes dry, nasal cannula Lungs: Diminished lung sounds on the right with rhonchi, productive cough, 3 L nasal cannula, no respiratory distress Cardiovascular: Irregularly irregular rhythm, no murmur, rub, gallop, no pitting edema Abdomen: Rounded, soft Extremities: No pitting edema Neuro: Alert, oriented, tired appearing GU: No Foley  Resolved Hospital Problem list    Assessment & Plan:  Right lung empyema and pneumonia, MSSA s/p chest tube and lytics; CT removed 1/21  Trapped lung  Reaccumulation of pleural effusion  Initial presentation with hypoxic respiratory failure, months of worsening exertional dyspnea, orthopnea.  Initial CT with large right-sided pleural effusion with a rind consistent with empyema.  Had chest tube placement and pleural fluid which was positive for MSSA.  Antibiotic course including ceftriaxone, azithromycin, linezolid, cefazolin.  Had initial chest tube from 1/14-1/20. Reported increasing white blood cell count, worsening cough on 04/13/2023.  Pulmonary reconsulted at this time. Chest x-ray with bilateral pleural effusions, right greater than left.  Comparing this to 1/24 chest x-ray, not significantly worse no indicate today for repeat chest tube placement.  Given that the patient has completed a course  of treatment earlier during this hospitalization with chest tube and pleural lytics, would try to avoid placement of  another chest tube.  On the other hand, if another one is placed, chemical pleurodesis should be performed prior to removal of the chest tube. f/u RVP, repeat sputum culture  100g albumin to be given prior to dialysis ordered. Likely cardiorenal component of ongoing bilateral effusions. Albumin is 1.9. will need osmotic pull to help offload fluid during dialysis sessions to optimize fluid balance. Encourage repeating this prior to each dialysis session if hypoalbuminemia on cmp appreciate ID input for duration of therapy with antibiotics. O2 to maintain SpO2 >90%  reconsult for worsening o2 requirements or respiratory distress   Below per primary: Atrial fibrillation with intermittent RVR Acute on chronic renal failure Anemia of chronic disease Acute diastolic heart failure Type 2 diabetes Malnutrition   Best Practice (right click and "Reselect all SmartList Selections" daily)  Below per primary Diet/type: NPO DVT prophylaxis: systemic heparin Pressure ulcer(s): Stage II sacral GI prophylaxis: N/A Lines: Dialysis Catheter Foley:  N/A Code Status:  full code Last date of multidisciplinary goals of care discussion [Per primary]  Labs   CBC: Recent Labs  Lab 04/08/23 2203 04/11/23 0410 04/12/23 0506 04/12/23 1441 04/13/23 0146 04/14/23 0630  WBC 24.1* 14.7* 11.5*  --  13.7* 11.7*  HGB 7.4* 7.1* 6.5* 8.1* 8.0* 7.0*  HCT 21.8* 21.4* 20.0* 23.3* 24.5* 21.2*  MCV 97.8 100.5* 99.0  --  97.2 97.7  PLT 293 268 230  --  232 181    Basic Metabolic Panel: Recent Labs  Lab 04/10/23 1600 04/11/23 0410 04/12/23 0506 04/13/23 0146 04/13/23 0424 04/14/23 0630  NA 124* 125* 126* 125*  --  130*  K 6.1* 4.1 4.6 4.9  --  4.0  CL 85* 89* 87* 85*  --  90*  CO2 23 27 26 24   --  26  GLUCOSE 258* 172* 224* 157*  --  217*  BUN 135* 68* 118* 141*  --  94*  CREATININE 6.34* 3.58* 5.27* 6.23*  --  4.41*  CALCIUM 7.9* 7.4* 7.7* 7.9*  --  7.8*  MG  --  2.0 2.1 2.2 2.3 2.0  PHOS 7.2* 4.7*  4.5 6.2*  --  3.2   GFR: Estimated Creatinine Clearance: 16.4 mL/min (A) (by C-G formula based on SCr of 4.41 mg/dL (H)). Recent Labs  Lab 04/11/23 0410 04/12/23 0506 04/13/23 0146 04/14/23 0630  WBC 14.7* 11.5* 13.7* 11.7*    Liver Function Tests: Recent Labs  Lab 04/07/23 2201 04/08/23 0237 04/10/23 1600 04/11/23 0410 04/12/23 0506 04/13/23 0146 04/14/23 0630  AST 43*  --   --   --   --   --   --   ALT <5  --   --   --   --   --   --   ALKPHOS 86  --   --   --   --   --   --   BILITOT 0.4  --   --   --   --   --   --   PROT 6.1*  --   --   --   --   --   --   ALBUMIN 1.9*   < > 1.9* 1.8* 1.8* 1.9* 1.9*   < > = values in this interval not displayed.   No results for input(s): "LIPASE", "AMYLASE" in the last 168 hours. No results for input(s): "AMMONIA" in the last 168 hours.  ABG    Component Value Date/Time   PHART 7.295 (L) 05/31/2019 2340   PCO2ART 30.2 (L) 05/31/2019 2340   PO2ART 82.0 (L) 05/31/2019 2340   HCO3 29.1 (H) 04/13/2023 2320   TCO2 28 03/29/2023 1017   ACIDBASEDEF 11.0 (H) 05/31/2019 2340   O2SAT 78.2 04/13/2023 2320     Coagulation Profile: No results for input(s): "INR", "PROTIME" in the last 168 hours.  Cardiac Enzymes: No results for input(s): "CKTOTAL", "CKMB", "CKMBINDEX", "TROPONINI" in the last 168 hours.  HbA1C: Hgb A1c MFr Bld  Date/Time Value Ref Range Status  03/29/2023 06:32 PM 6.6 (H) 4.8 - 5.6 % Final    Comment:    (NOTE) Pre diabetes:          5.7%-6.4%  Diabetes:              >6.4%  Glycemic control for   <7.0% adults with diabetes   06/01/2019 03:39 AM 7.2 (H) 4.8 - 5.6 % Final    Comment:    (NOTE) Pre diabetes:          5.7%-6.4% Diabetes:              >6.4% Glycemic control for   <7.0% adults with diabetes     CBG: Recent Labs  Lab 04/13/23 1119 04/13/23 1837 04/13/23 1904 04/13/23 2129 04/14/23 0640  GLUCAP 222* 164* 179* 172* 227*    Review of Systems:   As above  Past Medical History:   He,  has a past medical history of Acute renal failure (ARF) (HCC) (05/31/2019), B12 deficiency (08/02/2019), Bilateral carotid artery stenosis (05/31/2021), CAD (coronary artery disease) (2014), Chronic kidney disease, stage IV (severe) (HCC) (07/19/2019), Coronary artery disease involving native coronary artery of native heart without angina pectoris (07/19/2019), DM (diabetes mellitus) (HCC), Dyslipidemia (11/22/2019), Essential hypertension (07/19/2019), Gout, Gouty arthritis (09/06/2019), HTN (hypertension), MSSA (methicillin susceptible Staphylococcus aureus) septicemia (HCC) (06/09/2012), Nonrheumatic aortic valve insufficiency (07/19/2019), OSA (obstructive sleep apnea) (04/19/2013), PAF (paroxysmal atrial fibrillation) (HCC) (06/03/2019), Septic shock (HCC) (05/31/2019), and Shock (HCC) (05/31/2019).   Surgical History:   Past Surgical History:  Procedure Laterality Date   CARDIAC CATHETERIZATION  2014   CARDIOVERSION N/A 06/07/2019   Procedure: CARDIOVERSION;  Surgeon: Little Ishikawa, MD;  Location: [facility] ENDOSCOPY;  Service: Cardiovascular;  Laterality: N/A;   TEE WITHOUT CARDIOVERSION N/A 06/07/2019   Procedure: TRANSESOPHAGEAL ECHOCARDIOGRAM (TEE);  Surgeon: Little Ishikawa, MD;  Location: [facility] ENDOSCOPY;  Service: Cardiovascular;  Laterality: N/A;     Social History:   reports that he has quit smoking. He uses smokeless tobacco. He reports current alcohol use. He reports that he does not use drugs.   Family History:  His family history includes Heart attack in his father; Osteoporosis in his mother.   Allergies Allergies  Allergen Reactions   Allopurinol     Possible DRESS syndrome   Colchicine     Possible DRESS syndrome     Home Medications  Prior to Admission medications   Medication Sig Start Date End Date Taking? Authorizing Provider  aspirin 81 MG chewable tablet Chew 162 mg by mouth daily.   Yes [provider]  atorvastatin (LIPITOR) 40 MG  tablet Take 40 mg by mouth daily. 03/27/19  Yes [provider]  carvedilol (COREG) 25 MG tablet Take 1 tablet (25 mg total) by mouth 2 (two) times daily. Hold dose if heart rate is less than 50 06/07/19  Yes Kyle, Tyrone A, DO  Coenzyme Q10 300 MG CAPS Take 1  capsule by mouth daily.   Yes [provider]  Cyanocobalamin (B-12 PO) Take by mouth.   Yes [provider]  furosemide (LASIX) 40 MG tablet Take 40 mg by mouth daily.    Yes [provider]  Multiple Vitamins-Minerals (OCUVITE PO) Take 1 capsule by mouth at bedtime.   Yes [provider]  Omega-3 Fatty Acids (FISH OIL) 1000 MG CAPS Take 2,000 capsules by mouth 2 (two) times daily.   Yes [provider]  potassium chloride (MICRO-K) 10 MEQ CR capsule Take 20 mEq by mouth 2 (two) times daily.   Yes [provider]  telmisartan (MICARDIS) 40 MG tablet Take 40 mg by mouth daily.   Yes [provider]     Critical care time: 30 minutes    Marcelle Smiling, MD [facility] 04/14/23 1:30 PM  Please see [email or web address] for pager details.  From 7A-7P if no response, please call [phone] After hours, please call ELink [phone]

## 2023-04-14 NOTE — Assessment & Plan Note (Addendum)
Likely 2/2 deconditioning in addition to prolonged hospital stay and illness. - PT/OT eval/treat

## 2023-04-14 NOTE — Assessment & Plan Note (Addendum)
Change in mental status after HD 1/29, suspected to be delirium or 2/2 hypotension. Mental status improved throughout the night. Code stroke activated, CT head obtained that was negative.

## 2023-04-14 NOTE — Assessment & Plan Note (Addendum)
Presented initially in hypoxic respiratory failure secondary to right pleural effusion. CT obtained with large R pleural effusion with pleural thickening and rind, thoracostomy tube was placed with exudative pleural fluid with MSSA empyema. Pt has remote hx MSSA bacteremia >10y ago. Initially received Ceftriaxone and Azithro (1/14), switched to CTX and Linezolid, Cefazolin (1/16-1/29), coverage expanded with Vancomycin and Zosyn overnight due to new fever.  - Pulmonology consulted regarding CXR that appears worsened with increased cough, did not see indication for chest tube (signed off 1/29) - Continue Vanc, Zosyn - follow up new Bcx - am CBC - Wean O2 as able, goal >90% - Robitussin q4h PRN for cough - Atarax 10mg  TID PRN for anxiety - Oxycodone 5-10mg  q4h PRN moderate-severe pain

## 2023-04-14 NOTE — Assessment & Plan Note (Addendum)
Rates controlled <110 overnight - Cardiology consulted for assistance with rate control given his complexity, signed off 1/29 - PO Metoprolol Tartrate 12.5mg  if HR >130 x4-5h if BP tolerates - continue Amiodarone - continue Heparin - transition to DOAC when able - Cardiac monitoring - Cardiology considering TEE/DCCV when stabilized/on stable AC (prior to d/c or outpatient)

## 2023-04-14 NOTE — Progress Notes (Signed)
PHARMACY - ANTICOAGULATION CONSULT NOTE  Pharmacy Consult for IV heparin Indication: atrial fibrillation  Allergies  Allergen Reactions   Allopurinol     Possible DRESS syndrome   Colchicine     Possible DRESS syndrome    Patient Measurements: Height: 6\' 1"  (185.4 cm) Weight: 100.6 kg (221 lb 12.5 oz) IBW/kg (Calculated) : 79.9 Heparin Dosing Weight: 89 kg  Vital Signs: Temp: 97.8 F (36.6 C) (01/30 1619) Temp Source: Oral (01/30 1619) BP: 124/53 (01/30 1619) Pulse Rate: 97 (01/30 1800)  Labs: Recent Labs    04/12/23 0506 04/12/23 1441 04/13/23 0146 04/13/23 0424 04/14/23 0630 04/14/23 1800  HGB 6.5* 8.1* 8.0*  --  7.0*  --   HCT 20.0* 23.3* 24.5*  --  21.2*  --   PLT 230  --  232  --  181  --   HEPARINUNFRC 0.25*  --   --  0.29* 0.19* 0.10*  CREATININE 5.27*  --  6.23*  --  4.41*  --   TROPONINIHS  --   --  55* 55*  --   --     Estimated Creatinine Clearance: 16.4 mL/min (A) (by C-G formula based on SCr of 4.41 mg/dL (H)).  Assessment: 83 yo male with new afib, previously on heparin this admission, then it was held for intrapleural thrombolytics. Pharmacy asked to resume heparin 1/19.    Events of 1/29 noted with AMS, lethargy and concern for stroke.  Heparin was held briefly.  CT head negative for bleed and heparin resumed.  Heparin level 0.19 on 1600 units/hr.  Slightly subtherapeutic.  Hgb down to 7 today.  No overt bleeding or complications noted, suspect ongoing anemia with routine lab draws and new renal disease.    1/30 PM update: HL 0.10 No signs of bleeding or issues with the gtt per nursing Nurse stated the heparin has had no pauses or interruptions since the rate change.   Goal of Therapy:  Heparin level 0.3-0.7 units/ml Monitor platelets by anticoagulation protocol: Yes   Plan:  Increase IV heparin to 2100 units/hr Repeat heparin level in 8 hours Daily heparin level and CBC F/u transition to DOAC when appropriate   Thank you for  involving pharmacy in this patient's care.  Greta Doom BS, PharmD, BCPS Clinical Pharmacist 04/14/2023 6:56 PM  Contact: [phone] after 3 PM  "Be curious, not judgmental..." -Debbora Dus

## 2023-04-14 NOTE — Assessment & Plan Note (Addendum)
Echo demonstrated LVEF 50-55%, moderate LVH, RV mildly enlarged, LA and RA mildly dilated, and trivial MVR.  Received IV Lasix 80mg  initially in the ED. On lasix 40mg  daily at home. - Ted hose - Daily weights - Strict I/Os - Volume management per iHD

## 2023-04-14 NOTE — Progress Notes (Signed)
Physical Therapy Treatment Patient Details Name: Zachary Parrish MRN: [id number] DOB: 10-Feb-1941 Today's Date: 04/14/2023   History of Present Illness Zachary Parrish is an 83 year old gentleman admitted 1/14 presenting with greater than 2 months of progressive shortness of breath and leg edema. Over the last 2 weeks he has felt worse such that he is short of breath even at rest  He has been sleeping sitting upright sometimes in his car to prop him up enough. Pt also with afib. Pt with chest tube placed 1/14 due to pleural effusion. Pt  found to have AKI d/t ischemic ATN r/t septic shock with cultures growing staph aureus at chest tube 1/16. Pt transferred to ICU and CRRT initiated 1/16. PMH: A-fib many years ago, status post ablation and not recurrent so not on anticoagulation, CKD 3B    PT Comments  Pt received in bed, asleep, wife reports he was awake all morning until recently. Pt remained lethargic throughout session with constant stimulation needed to maintain wakefulness. He was slightly more participatory than last session and tolerated lift to recliner with maxisky. Pt with stiffness B knees, gentle PROM performed to each knee and pt's LE's left down in chair for stretch with instructions to wife to elevate after 30 mins. Pt with strong R lean today and resistive to correction to middle. Patient will benefit from continued inpatient follow up therapy, <3 hours/day. PT will continue to follow.     If plan is discharge home, recommend the following: Assistance with cooking/housework;Assist for transportation;Help with stairs or ramp for entrance;Two people to help with walking and/or transfers;Two people to help with bathing/dressing/bathroom   Can travel by private vehicle     No  Equipment Recommendations  None recommended by PT    Recommendations for Other Services       Precautions / Restrictions Precautions Precautions: Fall;Other (comment) Precaution Comments: watch BP and  O2 Restrictions Weight Bearing Restrictions Per Provider Order: No     Mobility  Bed Mobility Overal bed mobility: Needs Assistance Bed Mobility: Rolling Rolling: Max assist, Mod assist, +2 for physical assistance         General bed mobility comments: rolled to R with mod A, needed max A +2 to roll to L. Needs assist in part due to stiffness B knees and pain R knee    Transfers Overall transfer level: Needs assistance Equipment used: Ambulation equipment used Transfers: Bed to chair/wheelchair/BSC             General transfer comment: pt kept eyes open during transfer from bed to chair in maxisky and verbalized a few words once in chair Transfer via Lift Equipment: Maxisky  Ambulation/Gait               General Gait Details: unable at this time   Optometrist     Tilt Bed    Modified Rankin (Stroke Patients Only)       Balance Overall balance assessment: Needs assistance Sitting-balance support: Bilateral upper extremity supported, Feet supported Sitting balance-Leahy Scale: Poor Sitting balance - Comments: pt with R lean and when brought to middle reacts like he feels that he will fall over, seems that his sense of midline is altered. Maintains R lean in bed and in chair and resists correction strongly. Postural control: Right lateral lean  Cognition Arousal: Lethargic Behavior During Therapy: Flat affect Overall Cognitive Status: Impaired/Different from baseline Area of Impairment: Safety/judgement, Problem solving, Awareness, Attention, Following commands                   Current Attention Level: Focused Memory: Decreased short-term memory Following Commands: Follows one step commands with increased time, Follows one step commands inconsistently Safety/Judgement: Decreased awareness of safety Awareness: Intellectual Problem Solving: Requires verbal  cues General Comments: needed continuous arousal to maintain wakefulness        Exercises Total Joint Exercises Knee Flexion: PROM, Both, 5 reps, Seated General Exercises - Lower Extremity Ankle Circles/Pumps: Both, 10 reps, PROM, Seated    General Comments General comments (skin integrity, edema, etc.): pt's feet left down in recliner end of session for knee flexion, wife instructed to lift LE's in 30 mins. SPO2 low 90's, HR low 100's      Pertinent Vitals/Pain Pain Assessment Pain Assessment: Faces Faces Pain Scale: Hurts little more Pain Location: R knee with ROM Pain Descriptors / Indicators: Grimacing, Sore Pain Intervention(s): Limited activity within patient's tolerance, Monitored during session    Home Living                          Prior Function            PT Goals (current goals can now be found in the care plan section) Acute Rehab PT Goals Patient Stated Goal: to get stronger and be able to breathe PT Goal Formulation: With patient Time For Goal Achievement: 04/28/23 Potential to Achieve Goals: Fair Progress towards PT goals: Progressing toward goals (slowly)    Frequency    Min 1X/week      PT Plan      Co-evaluation              AM-PAC PT "6 Clicks" Mobility   Outcome Measure  Help needed turning from your back to your side while in a flat bed without using bedrails?: Total Help needed moving from lying on your back to sitting on the side of a flat bed without using bedrails?: Total Help needed moving to and from a bed to a chair (including a wheelchair)?: Total Help needed standing up from a chair using your arms (e.g., wheelchair or bedside chair)?: Total Help needed to walk in hospital room?: Total Help needed climbing 3-5 steps with a railing? : Total 6 Click Score: 6    End of Session Equipment Utilized During Treatment: Oxygen Activity Tolerance: Patient limited by lethargy Patient left: with call bell/phone within  reach;with family/visitor present;in chair;with chair alarm set Nurse Communication: Mobility status PT Visit Diagnosis: Unsteadiness on feet (R26.81);Muscle weakness (generalized) (M62.81);Pain Pain - Right/Left: Right Pain - part of body: Knee     Time: 1141-1206 PT Time Calculation (min) (ACUTE ONLY): 25 min  Charges:    $Therapeutic Activity: 23-37 mins PT General Charges $$ ACUTE PT VISIT: 1 Visit                     Lyanne Co, PT  Acute Rehab Services Secure chat preferred Office [phone]    Lawana Chambers Amariana Mirando 04/14/2023, 1:52 PM

## 2023-04-14 NOTE — Assessment & Plan Note (Addendum)
Previously on Coreg 25 mg BID and Telmisartan 20 mg daily for HTN at home however has been hypotensive throughout admission, requiring ICU for Levophed drip. BP now stable and has been off pressors. - Continue Midodrine 15mg  TID - Ted hose

## 2023-04-14 NOTE — Progress Notes (Signed)
PHARMACY - ANTICOAGULATION CONSULT NOTE  Pharmacy Consult for IV heparin Indication: atrial fibrillation  Allergies  Allergen Reactions   Allopurinol     Possible DRESS syndrome   Colchicine     Possible DRESS syndrome    Patient Measurements: Height: 6\' 1"  (185.4 cm) Weight: 100.6 kg (221 lb 12.5 oz) IBW/kg (Calculated) : 79.9 Heparin Dosing Weight: 89 kg  Vital Signs: Temp: 98.7 F (37.1 C) (01/30 0746) Temp Source: Oral (01/30 0746) BP: 109/81 (01/30 0746) Pulse Rate: 94 (01/30 0746)  Labs: Recent Labs    04/12/23 0506 04/12/23 1441 04/13/23 0146 04/13/23 0424 04/14/23 0630  HGB 6.5* 8.1* 8.0*  --  7.0*  HCT 20.0* 23.3* 24.5*  --  21.2*  PLT 230  --  232  --  181  HEPARINUNFRC 0.25*  --   --  0.29* 0.19*  CREATININE 5.27*  --  6.23*  --  4.41*  TROPONINIHS  --   --  55* 55*  --     Estimated Creatinine Clearance: 16.4 mL/min (A) (by C-G formula based on SCr of 4.41 mg/dL (H)).  Assessment: 83 yo male with new afib, previously on heparin this admission, then it was held for intrapleural thrombolytics. Pharmacy asked to resume heparin 1/19.    Events of 1/29 noted with AMS, lethargy and concern for stroke.  Heparin was held briefly.  CT head negative for bleed and heparin resumed.  Heparin level 0.19 on 1600 units/hr.  Slightly subtherapeutic.  Hgb down to 7 today.  No overt bleeding or complications noted, suspect ongoing anemia with routine lab draws and new renal disease.    Goal of Therapy:  Heparin level 0.3-0.7 units/ml Monitor platelets by anticoagulation protocol: Yes   Plan:  Increase IV heparin to 1750 units/hr Repeat heparin level in 8 hours Daily heparin level and CBC F/u transition to DOAC when appropriate   Thank you for involving pharmacy in this patient's care.  [employer], Pharm.D., BCPS Clinical Pharmacist  **Pharmacist phone directory can be found on [email or web address] listed under [facility].  04/14/2023 9:31 AM

## 2023-04-14 NOTE — Assessment & Plan Note (Addendum)
No meds at home. Controlled in the 100s here.  - CBGs 4 times daily, before meals and at bedtime - sensitive SSI with HS coverage

## 2023-04-14 NOTE — Progress Notes (Addendum)
Daily Progress Note Intern Pager: [phone]  Patient name: Zachary Parrish Medical record number: [id number] Date of birth: August 31, 1940 Age: 83 y.o. Gender: male  Primary Care Provider: Verl Bangs, MD Consultants: Nephrology, Cardiology (signed off) , PCCM (signed off), ID Code Status: Full   Pt Overview and Major Events to Date:  1/14 - Admitted to FMTS for acute hypoxic respiratory failure 2/2 R pulmonary effusion, CCM placed chest tube, cards consulted for uncontrolled afib 1/15 - Anuric (ARF 2/2 ischemic ATN in setting of septic shock), hypotensive, transferred to ICU for pressors. ID consulted and added Linezolid and Ceftriaxone for MSSA empyema. CRRT started. 1/16 - Linezolid/Ceftriaxone d/c'd, Cefazolin started. Cortrak placed. 1/21 - CRRT stopped, Lokelma started for hyperkalemia.  1/23 - sputum culture moderate candida albicans, thought to be colonized 1/24 - Remained anuric/hyperkalemic, HD initiated again. Came off pressors. 1/27 - Transferred back to FMTS 1/29 - Febrile overnight, code stroke called,  Vancomycin and Zosyn started   Assessment and Plan:  82yo PMHx Afib s/p DCCV, carotid stenosis, CKD stage 3b, HLD, CAD, HTN, T2DM who presented initially with shortness of breath, found to have large right pulmonary effusion and initially in afib with RVR.  Hospital course complicated by septic shock (resolved), new dialysis requirement due to AKI on CKD IV requiring ICU care, now transferred to floor with tenuous status.   After dialysis last night, pt seemed to be more drowsy with concern for facial droop by RN. Neurology evaluated at bedside who recommended pausing heparin and getting CT head. CT head was negative for acute change, heparin gtt was restarted. Mental status changes reportedly improved throughout the night.  Pt also fevered to 100.4, blood cultures obtained, antibiotics expanded to Zosyn and Vanc. If blood cultures remain negative will plan to switch back  to Cefazolin per ID Assessment & Plan Empyema of right pleural space (HCC) Presented initially in hypoxic respiratory failure secondary to right pleural effusion. CT obtained with large R pleural effusion with pleural thickening and rind, thoracostomy tube was placed with exudative pleural fluid with MSSA empyema. Pt has remote hx MSSA bacteremia >10y ago. Initially received Ceftriaxone and Azithro (1/14), switched to CTX and Linezolid, Cefazolin (1/16-1/29), coverage expanded with Vancomycin and Zosyn overnight due to new fever.  - Pulmonology consulted regarding CXR that appears worsened with increased cough, did not see indication for chest tube (signed off 1/29) - Continue Vanc, Zosyn - follow up new Bcx - am CBC - Wean O2 as able, goal >90% - Robitussin q4h PRN for cough - Atarax 10mg  TID PRN for anxiety - Oxycodone 5-10mg  q4h PRN moderate-severe pain Atrial fibrillation with RVR (HCC) Rates controlled <110 overnight - Cardiology consulted for assistance with rate control given his complexity, signed off 1/29 - PO Metoprolol Tartrate 12.5mg  if HR >130 x4-5h if BP tolerates - continue Amiodarone - continue Heparin - transition to DOAC when able - Cardiac monitoring - Cardiology considering TEE/DCCV when stabilized/on stable AC (prior to d/c or outpatient) Change in mental status Change in mental status after HD 1/29, suspected to be delirium or 2/2 hypotension. Mental status improved throughout the night. Code stroke activated, CT head obtained that was negative.  Hypotension Previously on Coreg 25 mg BID and Telmisartan 20 mg daily for HTN at home however has been hypotensive throughout admission, requiring ICU for Levophed drip. BP now stable and has been off pressors. - Continue Midodrine 15mg  TID - Ted hose AKI (acute kidney injury) (HCC) Ischemic ATN in setting of septic  shock. US obtained with no sign of obstruction. Has had refractory hyperkalemia throughout admission. Received  IHD last night due to refractory hyperkalemia and anuria (CRRT 1/15-1/21). Nephrology planning for dialysis MWF - Nephrology following, appreciate recs - stopped Lokelma 10g BID (previously refractory hyperkalemia, has been stable the last few days) - renal diet - Strict I/O - daily weights - am RFP, Mag Anemia of chronic disease Hemoglobin 7 this morning. Stable. Likely in the setting of CKD. Receives ESA outpatient if Hgb <10.  - am CBC - transfuse if <7 Acute diastolic heart failure (HCC) Echo demonstrated LVEF 50-55%, moderate LVH, RV mildly enlarged, LA and RA mildly dilated, and trivial MVR.  Received IV Lasix 80mg  initially in the ED. On lasix 40mg  daily at home. - Ted hose - Daily weights - Strict I/Os - Volume management per iHD Type 2 diabetes mellitus with complication, without long-term current use of insulin (HCC) No meds at home. Controlled in the 100s here.  - CBGs 4 times daily, before meals and at bedtime - sensitive SSI with HS coverage Decreased mobility and endurance Likely 2/2 deconditioning in addition to prolonged hospital stay and illness. - PT/OT eval/treat Malnutrition of moderate degree Cortrak in place, TF initiated 1/17. TF rate decreased to 20 ml/hr by Provider (not in order in [facility]) to see if appetite will improve 1/21. On 1/22, Episode of emesis after eating greasy sausage; abd xray with Cortrak tip in stomach, unremarkable bowel gas pattern. On 1/23, dysphagia 3/thin diet recommended by SLP. Made NPO 1/27 due to increased weakness with chewing foods.  - SLP following - Zofran 4mg  q6h PRN nausea/vomiting - Decrease Reglan to 5mg  q8h (goal to discontinue)  Chronic and Stable Problems:  Chronic back pain - continue Robaxin 500mg  q8h PRN, caution for signs of toxicity 2/2 renal failure Alcohol use - RUQ US unremarkable. Continue folate, MVI, thiamine. Did require phenobarbital taper during admission, now outside withdrawal window.  Gout: concern for gout  during admission but uric acid normal, got prednisone 20mg  x3 days (ended 1/27) Thyroid lesion - CT chest w/o contrast with 2.4cm cystic lesion in R thyroid lobe, TSH 3.279 on admission HLD - continue lipitor 40mg  Tobacco use - continue 21mg  nicotine patch   FEN/GI: TF iva Cortrak PPx: heparin gtt Dispo:SNF pending clinical improvement    Subjective:  After dialysis last night, pt seemed to be more drowsy with concern for facial droop by RN. CT head unremarkable for acute change Pt also fevered to 100.4, blood cultures obtained, antibiotics expanded to Zosyn and Vanc.  Pt and wife with no concerns this morning  Objective: Temp:  [97.8 F (36.6 C)-100.4 F (38 C)] 98.8 F (37.1 C) (01/30 0318) Pulse Rate:  [30-138] 100 (01/30 0318) Resp:  [17-28] 20 (01/30 0318) BP: (98-147)/(49-89) 101/89 (01/30 0318) SpO2:  [95 %-100 %] 95 % (01/30 0318) Weight:  [100.5 kg-100.6 kg] 100.6 kg (01/30 0318) Physical Exam: Sitting up right, NG in place Talkative--knows year (after two tries), years married to wife  Irregular rate and rhythm Lungs coarse and wet with rhonchi throughout, R? L Abdomen soft Trace ankle edema No erythema on LE   Laboratory: Most recent CBC Lab Results  Component Value Date   WBC 11.7 (H) 04/14/2023   HGB 7.0 (L) 04/14/2023   HCT 21.2 (L) 04/14/2023   MCV 97.7 04/14/2023   PLT 181 04/14/2023   Most recent BMP    Latest Ref Rng & Units 04/13/2023    1:46 AM  BMP  Glucose 70 - 99 mg/dL 098   BUN 8 - 23 mg/dL 119   Creatinine 1.47 - 1.24 mg/dL 8.29   Sodium 562 - 130 mmol/L 125   Potassium 3.5 - 5.1 mmol/L 4.9   Chloride 98 - 111 mmol/L 85   CO2 22 - 32 mmol/L 24   Calcium 8.9 - 10.3 mg/dL 7.9    Imaging/Diagnostic Tests: CXR 04/13/23 IMPRESSION: Effusions right greater than left stable from the prior exam. Tubes and lines as described.  Eddy Liszewski, DO 04/14/2023, 7:04 AM  PGY-1, [facility] FPTS Intern pager: [phone], text  pages welcome Secure chat group [facility] Teaching Service

## 2023-04-14 NOTE — Assessment & Plan Note (Addendum)
Hemoglobin 7 this morning. Stable. Likely in the setting of CKD. Receives ESA outpatient if Hgb <10.  - am CBC - transfuse if <7

## 2023-04-15 ENCOUNTER — Inpatient Hospital Stay (HOSPITAL_COMMUNITY): Payer: Medicare Other

## 2023-04-15 DIAGNOSIS — J869 Pyothorax without fistula: Secondary | ICD-10-CM | POA: Diagnosis not present

## 2023-04-15 DIAGNOSIS — N179 Acute kidney failure, unspecified: Secondary | ICD-10-CM | POA: Diagnosis not present

## 2023-04-15 DIAGNOSIS — A4901 Methicillin susceptible Staphylococcus aureus infection, unspecified site: Secondary | ICD-10-CM | POA: Diagnosis not present

## 2023-04-15 LAB — GLUCOSE, CAPILLARY
Glucose-Capillary: 167 mg/dL — ABNORMAL HIGH (ref 70–99)
Glucose-Capillary: 175 mg/dL — ABNORMAL HIGH (ref 70–99)
Glucose-Capillary: 168 mg/dL — ABNORMAL HIGH (ref 70–99)
Glucose-Capillary: 185 mg/dL — ABNORMAL HIGH (ref 70–99)

## 2023-04-15 LAB — RENAL FUNCTION PANEL
Albumin: 2.9 g/dL — ABNORMAL LOW (ref 3.5–5.0)
Anion gap: 15 (ref 5–15)
BUN: 110 mg/dL — ABNORMAL HIGH (ref 8–23)
CO2: 24 mmol/L (ref 22–32)
Calcium: 8.1 mg/dL — ABNORMAL LOW (ref 8.9–10.3)
Chloride: 87 mmol/L — ABNORMAL LOW (ref 98–111)
Creatinine, Ser: 5.2 mg/dL — ABNORMAL HIGH (ref 0.61–1.24)
GFR, Estimated: 10 mL/min — ABNORMAL LOW (ref >60)
Glucose, Bld: 152 mg/dL — ABNORMAL HIGH (ref 70–99)
Phosphorus: 4.7 mg/dL — ABNORMAL HIGH (ref 2.5–4.6)
Potassium: 4.1 mmol/L (ref 3.5–5.1)
Sodium: 126 mmol/L — ABNORMAL LOW (ref 135–145)

## 2023-04-15 LAB — CBC
HCT: 22 % — ABNORMAL LOW (ref 39.0–52.0)
Hemoglobin: 7.2 g/dL — ABNORMAL LOW (ref 13.0–17.0)
MCH: 32.3 pg (ref 26.0–34.0)
MCHC: 32.7 g/dL (ref 30.0–36.0)
MCV: 98.7 fL (ref 80.0–100.0)
Platelets: 186 10*3/uL (ref 150–400)
RBC: 2.23 MIL/uL — ABNORMAL LOW (ref 4.22–5.81)
RDW: 18.5 % — ABNORMAL HIGH (ref 11.5–15.5)
WBC: 13.7 10*3/uL — ABNORMAL HIGH (ref 4.0–10.5)
nRBC: 0.1 % (ref 0.0–0.2)

## 2023-04-15 LAB — HEPARIN LEVEL (UNFRACTIONATED): Heparin Unfractionated: 0.54 [IU]/mL (ref 0.30–0.70)

## 2023-04-15 LAB — MAGNESIUM: Magnesium: 2 mg/dL (ref 1.7–2.4)

## 2023-04-15 MED ORDER — GUAIFENESIN-DM 100-10 MG/5ML PO SYRP: 5.0000 mL | ORAL_SOLUTION | ORAL | Status: DC | PRN

## 2023-04-15 MED ORDER — APIXABAN 2.5 MG PO TABS
2.5000 mg | ORAL_TABLET | Freq: Two times a day (BID) | ORAL | Status: DC
  Administered 2023-04-15 – 2023-04-17 (×5): 2.5 mg via ORAL
  Filled 2023-04-15 (×5): qty 1

## 2023-04-15 MED ORDER — CEFAZOLIN SODIUM-DEXTROSE 1-4 GM/50ML-% IV SOLN
1.0000 g | Freq: Every day | INTRAVENOUS | Status: DC
  Administered 2023-04-15 – 2023-04-16 (×2): 1 g via INTRAVENOUS
  Filled 2023-04-15 (×3): qty 50

## 2023-04-15 NOTE — Progress Notes (Addendum)
Daily Progress Note Intern Pager: [phone]  Patient name: Zachary Parrish Medical record number: [id number] Date of birth: [birth year] Age: 83 y.o. Gender: male  Primary Care Provider: Verl Bangs, MD Consultants: Nephrology, Cardiology (signed off) , PCCM (signed off), ID (signed off) Code Status: Full  Pt Overview and Major Events to Date:  1/14 - Admitted to FMTS for acute hypoxic respiratory failure 2/2 R pulmonary effusion, CCM placed chest tube, cards consulted for uncontrolled afib 1/15 - Anuric (ARF 2/2 ischemic ATN in setting of septic shock), hypotensive, transferred to ICU for pressors. ID consulted and added Linezolid and Ceftriaxone for MSSA empyema. CRRT started. 1/16 - Linezolid/Ceftriaxone d/c'd, Cefazolin started. Cortrak placed. 1/21 - CRRT stopped, Lokelma started for hyperkalemia.  1/23 - sputum culture moderate candida albicans, thought to be colonized 1/24 - Remained anuric/hyperkalemic, HD initiated again. Came off pressors. 1/27 - Transferred back to FMTS 1/29 - Febrile overnight, code stroke called,  Vancomycin and Zosyn started  1/31 - transitioned back to Cefazolin  Assessment and Plan:  82yo PMHx Afib s/p DCCV, carotid stenosis, CKD stage 3b, HLD, CAD, HTN, T2DM who presented initially with shortness of breath, found to have large right pulmonary effusion and initially in afib with RVR.  Hospital course complicated by septic shock (resolved), new dialysis requirement due to AKI on CKD IV requiring ICU care. Palliative to see today Assessment & Plan Empyema of right pleural space (HCC) Presented initially in hypoxic respiratory failure secondary to right pleural effusion. CT obtained with large R pleural effusion with pleural thickening and rind, thoracostomy tube was placed with exudative pleural fluid with MSSA empyema. Pt has remote hx MSSA bacteremia >10y ago. Initially received Ceftriaxone and Azithro (1/14), switched to CTX and Linezolid,  Cefazolin (1/16-1/29), coverage expanded with Vancomycin and Zosyn due to new fever x1. Flu, covid, RSV negative. MRSA PCR negative. Bcx Ngx2d.  - Pulmonology consulted regarding CXR that appears worsened with increased cough, did not see indication for chest tube (signed off 1/29) - Transitioned back to Cefazolin today 1/31 - am CBC - Wean O2 as able, goal >90% - Robitussin q4h PRN for cough - Atarax 10mg  TID PRN for anxiety - Oxycodone 5-10mg  q4h PRN moderate-severe pain Atrial fibrillation with RVR (HCC) Rates controlled <110 overnight - Cardiology consulted for assistance with rate control given his complexity, signed off 1/29 - PO Metoprolol Tartrate 12.5mg  if HR >130 x4-5h if BP tolerates - continue Amiodarone - restarted Eliquis (stopped Heparin 1/31) - Cardiac monitoring - Cardiology has signed off, no cardioversion this admission  Hypotension Previously on Coreg 25 mg BID and Telmisartan 20 mg daily for HTN at home however has been hypotensive throughout admission, requiring ICU for Levophed drip. BP now stable and has been off pressors. - Continue Midodrine 15mg  TID - Ted hose AKI (acute kidney injury) (HCC) Ischemic ATN in setting of septic shock. US obtained with no sign of obstruction. Has had refractory hyperkalemia throughout admission. Received IHD last night due to refractory hyperkalemia and anuria (CRRT 1/15-1/21). Nephrology planning for dialysis MWF - Nephrology following, appreciate recs - stopped Lokelma 10g BID (previously refractory hyperkalemia, has been stable the last few days) - renal diet - Strict I/O - daily weights - am RFP, Mag Malnutrition of moderate degree Cortrak in place, TF initiated 1/17. TF rate decreased to 20 ml/hr by Provider (not in order in [facility]) to see if appetite will improve 1/21. On 1/22, Episode of emesis after eating greasy sausage; abd xray with Cortrak tip  in stomach, unremarkable bowel gas pattern. On 1/23, dysphagia 3/thin diet  recommended by SLP. Made NPO 1/27 due to increased weakness with chewing foods. Discontinued Reglan today.  - SLP following, planning for MBS today - Zofran 4mg  q6h PRN nausea/vomiting Anemia of chronic disease Hemoglobin 7.2 this morning. Stable. Likely in the setting of CKD. Receives ESA outpatient if Hgb <10.  - am CBC - transfuse if <7 Acute diastolic heart failure (HCC) Echo demonstrated LVEF 50-55%, moderate LVH, RV mildly enlarged, LA and RA mildly dilated, and trivial MVR.  Received IV Lasix 80mg  initially in the ED. On lasix 40mg  daily at home. - Ted hose - Daily weights - Strict I/Os - Volume management per iHD Type 2 diabetes mellitus with complication, without long-term current use of insulin (HCC) No meds at home. Controlled in the 100s here.  - CBGs 4 times daily, before meals and at bedtime - sensitive SSI with HS coverage Decreased mobility and endurance Likely 2/2 deconditioning in addition to prolonged hospital stay and illness. - PT/OT eval/treat Change in mental status (Resolved: 04/15/2023) Change in mental status after HD 1/29, suspected to be delirium or 2/2 hypotension. Mental status improved throughout the night. Code stroke activated, CT head obtained that was negative.    Chronic and Stable Problems:  Chronic back pain - oxy 2.5mg  PRN, tylenol 650mg  PRN Alcohol use - RUQ US unremarkable. Continue folate, MVI, thiamine. Did require phenobarbital taper during admission, now outside withdrawal window.  Gout: concern for gout during admission but uric acid normal, got prednisone 20mg  x3 days (ended 1/27) Thyroid lesion - CT chest w/o contrast with 2.4cm cystic lesion in R thyroid lobe, TSH 3.279 on admission HLD - continue lipitor 40mg  Tobacco use - continue 21mg  nicotine patch   FEN/GI: TF via Cortrak  PPx: changed to apixaban  Dispo:SNF pending clinical improvement   Subjective:  NAEON. Feeling okay this morning, no complaints  Objective: Temp:  [97.6  F (36.4 C)-98.7 F (37.1 C)] 98.3 F (36.8 C) (01/31 0353) Pulse Rate:  [69-142] 92 (01/31 0353) Resp:  [20-24] 24 (01/31 0353) BP: (109-138)/(53-84) 127/57 (01/31 0353) SpO2:  [92 %-100 %] 96 % (01/31 0353) Weight:  [99.6 kg] 99.6 kg (01/31 0509) Physical Exam: General: no acute distress, resting upright, NG in place Cardiovascular: irregularly irregular, regular rate Respiratory: Rhonchi throughout, diminished breath sounds Abdomen: Soft, non-distended Extremities: No significant swelling BLE  Laboratory: Most recent CBC Lab Results  Component Value Date   WBC 13.7 (H) 04/15/2023   HGB 7.2 (L) 04/15/2023   HCT 22.0 (L) 04/15/2023   MCV 98.7 04/15/2023   PLT 186 04/15/2023   Most recent BMP    Latest Ref Rng & Units 04/15/2023    3:25 AM  BMP  Glucose 70 - 99 mg/dL 045   BUN 8 - 23 mg/dL 409   Creatinine 8.11 - 1.24 mg/dL 9.14   Sodium 782 - 956 mmol/L 126   Potassium 3.5 - 5.1 mmol/L 4.1   Chloride 98 - 111 mmol/L 87   CO2 22 - 32 mmol/L 24   Calcium 8.9 - 10.3 mg/dL 8.1     Katianna Mcclenney, DO 04/15/2023, 7:08 AM  PGY-1, [facility] FPTS Intern pager: [phone], text pages welcome Secure chat group [facility] Teaching Service

## 2023-04-15 NOTE — Progress Notes (Signed)
PHARMACY - ANTICOAGULATION CONSULT NOTE  Pharmacy Consult for heparin Indication: atrial fibrillation  Labs: Recent Labs    04/13/23 0146 04/13/23 0424 04/14/23 0630 04/14/23 1800 04/15/23 0325  HGB 8.0*  --  7.0*  --  7.2*  HCT 24.5*  --  21.2*  --  22.0*  PLT 232  --  181  --  186  HEPARINUNFRC  --  0.29* 0.19* 0.10* 0.54  CREATININE 6.23*  --  4.41*  --  5.20*  TROPONINIHS 55* 55*  --   --   --    Assessment/Plan:  83yo male therapeutic on heparin after rate change. Will continue infusion at current rate of 2100 units/hr and confirm stable with additional level.  Vernard Gambles, PharmD, BCPS 04/15/2023 4:43 AM

## 2023-04-15 NOTE — Assessment & Plan Note (Addendum)
 Likely 2/2 deconditioning in addition to prolonged hospital stay and illness. - PT/OT eval/treat

## 2023-04-15 NOTE — Progress Notes (Signed)
RT NTS patient with removal of small amount of tan pink tinged secretions. Patient tolerated procedure will with improvement of breath sounds.

## 2023-04-15 NOTE — Progress Notes (Signed)
Nutrition Follow-up  DOCUMENTATION CODES:   Non-severe (moderate) malnutrition in context of acute illness/injury  INTERVENTION:   Tube Feeding via Cortrak: Continue to meet 100% nutritional needs via TF Vital 1.5 at 50 ml/hr [employer] TF20 60 mL to BID TF provides 1960 kcals, 121 g of protein and 912 mL of free water  Monitor need for additional free water flushes; minimal flushes for now given anuric and iHD  Continue Renal MVI with Thiamine and Folic Acid  Full Liquid diet as tolerated  Continue scheduled bowel regimen but if no BM in 1-2 days, recommend escalating bowel regimen  NUTRITION DIAGNOSIS:   Moderate Malnutrition related to acute illness as evidenced by moderate muscle depletion, mild fat depletion, energy intake < 75% for > 7 days.  Being addressed via TF   GOAL:   Patient will meet greater than or equal to 90% of their needs  Progressing  MONITOR:   Supplement acceptance, PO intake  REASON FOR ASSESSMENT:   Consult Assessment of nutrition requirement/status  ASSESSMENT:   Pt presented to ED w/ SOB and found to have AKI d/t ischemic ATN r/t septic shock. PMH: HTN, T2DM, CAD, A-fib. Has large R pleural effusion and small L pleural effusion. Chst tube in place and culture grew staph aureus. CRRT also initiated.  1/15 CRRT initiated 1/17 Cortrak placed, TF initiated 1/21 CRRT discontinued, TF rate decreased to 20 ml/hr by Provider (not in order in [facility]) to see if appetite will improve 1/22 Episode of emesis after eating greasy sausage; abd xray with Cortrak tip in stomach, unremarkable bowel gas pattern 1/23 SLP eval, Dysphagia 3/Thins recommended; TF changed to nocturnal  1/24 iHD x 3 hours with 2L net UF 1/26 iHD overnight, lokelma for hyperkalemia 1/28 SLP downgraded to Dysphagia 1 in AM, later changed to NPO due to pt pocketing food, retaining liquids in mouth. Full goal TF resumed  Pt sleeping on visit today, fell asleep immediately after  working with OT.   Noted plan for iHD again today, remains anuric  Remains NPO. Cortrak in place with Vital 1.5 at 50, PS x 3, tolerating MBS today, SLP note reviewed. Noted primary issue related to esophogeal dysfunction with potential post prandial aspiration. Noted significant retention of barium within esophagus. Noted diet advanced to Full Liquid with small sips/bites for pleasure and only when alert and strict aspiration precautions.   +constipation with last BM on 1/27 (large type 6)  Labs: sodium 126 (L), BUN 110, Creatinine 5.20, phosphorus 4.7, magnesium 2.0 (Wdl), CBGs 146-227 Meds: Renal MVI, folic acid, Thiamine, ss novolog, midodrine, reglan, thiamine   Diet Order:   Diet Order             Diet NPO time specified Except for: Ice Chips  Diet effective now                   EDUCATION NEEDS:   Education needs have been addressed  Skin:  Skin Assessment: Skin Integrity Issues: Skin Integrity Issues:: Stage II Stage II: sacrum  Last BM:  1/27 type 6 large  Height:   Ht Readings from Last 1 Encounters:  03/29/23 6\' 1"  (1.854 m)    Weight:   Wt Readings from Last 1 Encounters:  04/15/23 99.6 kg    Ideal Body Weight:  83.6 kg  BMI:  Body mass index is 28.97 kg/m.  Estimated Nutritional Needs:   Kcal:  1900-2100 kcals  Protein:  110-130 g  Fluid:  1L plus UOP   Cate  Jenean Lindau MS, RDN, LDN, CNSC Registered Dietitian 3 Clinical Nutrition RD Inpatient Contact Info in Amion

## 2023-04-15 NOTE — Procedures (Deleted)
HR 102, rhythm afib.

## 2023-04-15 NOTE — Assessment & Plan Note (Addendum)
Cortrak in place, TF initiated 1/17. TF rate decreased to 20 ml/hr by Provider (not in order in [facility]) to see if appetite will improve 1/21. On 1/22, Episode of emesis after eating greasy sausage; abd xray with Cortrak tip in stomach, unremarkable bowel gas pattern. On 1/23, dysphagia 3/thin diet recommended by SLP. Made NPO 1/27 due to increased weakness with chewing foods. Discontinued Reglan today.  - SLP following, planning for MBS today - Zofran 4mg  q6h PRN nausea/vomiting

## 2023-04-15 NOTE — Progress Notes (Addendum)
Occupational Therapy Treatment Patient Details Name: Zachary Parrish MRN: [id number] DOB: 30-Jan-1941 Today's Date: 04/15/2023   History of present illness Zachary Parrish is an 83 year old gentleman admitted 1/14 presenting with greater than 2 months of progressive shortness of breath and leg edema. Over the last 2 weeks he has felt worse such that he is short of breath even at rest. He has been sleeping sitting upright sometimes in his car to prop him up enough. Pt also with afib. Pt with chest tube placed 1/14 due to pleural effusion. Pt  found to have AKI d/t ischemic ATN r/t septic shock with cultures growing staph aureus at chest tube 1/16. Pt transferred to ICU and on CRRT 1/16-1/21/2025. Temp HD catheter placed 04/08/23 and pt now receiving HD. Pt transferred out of ICU on 04/10/23. PMH: A-fib many years ago, status post ablation and not recurrent so not on anticoagulation, CKD 3B   OT comments  OT session focused on training in bed mobility for carryover to functional tasks, training in techniques for increased safety and independence with functional tasks, and B UE AAROM therapeutic exercises (see details below) to increase strength and activity tolerance and maintain joint integrity. OT also educated pt's wife in safely assisting pt with AAROM therapeutic exercises with wife demonstrating understanding through teach back. OT to also provide a written B UE AAROM HEP. Pt currently completes UB ADLs with Set up to Max assist, LB ADLs with Total assist +2, and rolling L/R in bed with Mod to Max assist. Pt's HR in the low-90s to mid-110s throughout session. O2 sat at >/96% on 2L continuous O2 through nasal cannula throughout session. Pt making limited progress toward goals secondary to change in medical condition. Pt conintues to participate well in sessions, has good family support, and will benefit from continued acute skilled OT services to address deficits outlined below, decrease caregiver burden, and increase  safety and independence with functional tasks. Post acute discharge, pt will benefit from intensive inpatient skilled rehab services < 3 hours per day to maximize rehab potential.       If plan is discharge home, recommend the following:  Two people to help with walking and/or transfers;Two people to help with bathing/dressing/bathroom;Assistance with cooking/housework;Assistance with feeding;Direct supervision/assist for medications management;Direct supervision/assist for financial management;Assist for transportation;Help with stairs or ramp for entrance;Supervision due to cognitive status   Equipment Recommendations  Other (comment) (defer to next level of care)    Recommendations for Other Services      Precautions / Restrictions Precautions Precautions: Fall;Other (comment) Precaution Comments: watch BP and O2 Restrictions Weight Bearing Restrictions Per Provider Order: No       Mobility Bed Mobility Overal bed mobility: Needs Assistance Bed Mobility: Rolling Rolling: Max assist, Mod assist, +2 for physical assistance         General bed mobility comments: Rolled to R with mod A, needed max A +2 to roll to L. Requires cues for initiation, sequencing, and hand placement/technique. Pt with impaired B UE coordination and generalized weakness affecting functional level with bed mobility. Pt also demonstrating limited functional use of L UE during rolling.    Transfers Overall transfer level: Needs assistance                 General transfer comment: Not addressed this session as pt awaiting transport for swallowing study. pt currently requiring use of mecahnical lift for functional transfers.     Balance  ADL either performed or assessed with clinical judgement   ADL Overall ADL's : Needs assistance/impaired Eating/Feeding: Set up;Supervision/ safety;Bed level (cues for initiation; simulated)   Grooming:  Moderate assistance;Minimal assistance;Cueing for sequencing;Bed level (cues for initiation)   Upper Body Bathing: Maximal assistance;Cueing for sequencing;Bed level (cues for initiation)   Lower Body Bathing: Total assistance;+2 for physical assistance;+2 for safety/equipment;Bed level   Upper Body Dressing : Maximal assistance;Cueing for sequencing;Cueing for compensatory techniques;Bed level (cues for initiation)   Lower Body Dressing: Total assistance;+2 for physical assistance;+2 for safety/equipment;Bed level     Toilet Transfer Details (indicate cue type and reason): Pt currently requiring mechanical lift for functional transfers Toileting- Clothing Manipulation and Hygiene: Total assistance;+2 for physical assistance;+2 for safety/equipment;Bed level         General ADL Comments: Pt with decline in funcitonal level from initial eval due to medical status. Pt continues to present with decreased activity tolerance and now also often with decreased level of alertness, including this session.    Extremity/Trunk Assessment Upper Extremity Assessment Upper Extremity Assessment: Right hand dominant;Generalized weakness;RUE deficits/detail;LUE deficits/detail RUE Deficits / Details: generalized weakness; mild shoulder flexion deficits at baseline but WFL; decreased coordination; redness and swelling noted to MP joint of first finger with pt reporting no pain in joint at this time RUE Coordination: decreased fine motor;decreased gross motor LUE Deficits / Details: generalized weakness; mild shoulder flexion deficits at baseline but WFL; decreased coordination LUE Coordination: decreased fine motor;decreased gross motor   Lower Extremity Assessment Lower Extremity Assessment: Defer to PT evaluation        Vision       Perception     Praxis      Cognition Arousal: Lethargic, Obtunded (lethargic at beginning of session and becoming octunded toward end of session with  fatigue) Behavior During Therapy: Flat affect Overall Cognitive Status: Impaired/Different from baseline Area of Impairment: Safety/judgement, Problem solving, Awareness, Attention, Following commands                   Current Attention Level: Focused Memory: Decreased short-term memory Following Commands: Follows one step commands with increased time, Follows one step commands inconsistently Safety/Judgement: Decreased awareness of safety, Decreased awareness of deficits Awareness: Intellectual Problem Solving: Requires verbal cues General Comments: needed continuous arousal to maintain wakefulness        Exercises Exercises: General Upper Extremity, Hand exercises General Exercises - Upper Extremity Shoulder Flexion: AAROM, Strengthening, Both, Supine, 5 reps (with HOB elevated; increased activity tolerance; maintain joint integrity; OT instructed wife in assisting pt with ther ex with wife demonstrating understanding through teach back) Shoulder Extension: AAROM, Strengthening, Both, Supine, 5 reps (with HOB elevated; increased activity tolerance; maintain joint integrity; OT instructed wife in assisting pt with ther ex with wife demonstrating understanding through teach back) Elbow Flexion: AAROM, Strengthening, Both, 5 reps, Supine (with HOB elevated; increased activity tolerance; maintain joint integrity; OT instructed wife in assisting pt with ther ex with wife demonstrating understanding through teach back) Elbow Extension: AAROM, Strengthening, Both, 5 reps, Supine (with HOB elevated; increased activity tolerance; maintain joint integrity; OT instructed wife in assisting pt with ther ex with wife demonstrating understanding through teach back) Wrist Flexion: AAROM, Strengthening, Both, 5 reps, Supine (with HOB elevated; increased activity tolerance; maintain joint integrity) Wrist Extension: AAROM, Strengthening, Both, 5 reps, Supine (with HOB elevated; increased activity  tolerance; maintain joint integrity) Hand Exercises Forearm Supination: AAROM, Both, 5 reps, Supine (with HOB elevated; increased activity tolerance; maintain joint integrity; OT  instructed wife in assisting pt with ther ex with wife demonstrating understanding through teach back) Forearm Pronation: AAROM, Both, 5 reps, Supine (with HOB elevated; increased activity tolerance; maintain joint integrity; OT instructed wife in assisting pt with ther ex with wife demonstrating understanding through teach back)    Shoulder Instructions       General Comments OT educated wife and pt in B UE AAROM exercises x3 with wife demonstrating understanding and ability to safely assist pt with exercises through teach back. OT also to provide written B UE AAROM HEP adn will train family in assisting pt. HR in the low-90s to mid-110s throughout session. O2 sat at >/96% on 2L continuous O2 through nasal cannula throughout session. Pt's wife present throughout session. MD present at beginning of session and Dietitian present at end of session.    Pertinent Vitals/ Pain       Pain Assessment Pain Assessment: Faces Faces Pain Scale: Hurts little more Pain Location: lower back; R knee with ROM Pain Descriptors / Indicators: Grimacing, Sore Pain Intervention(s): Limited activity within patient's tolerance, Monitored during session, Repositioned  Home Living                                          Prior Functioning/Environment              Frequency  Min 1X/week        Progress Toward Goals  OT Goals(current goals can now be found in the care plan section)  Progress towards OT goals: Not progressing toward goals - comment;Goals updated;Goals drowngraded-see care plan (Pt making little to no progress toward goals secondary to change in medical condition. Pt conintues to participate well in sessions and will benefit from continued acute skilled OT services.)  Acute Rehab OT  Goals Patient Stated Goal: to get better OT Goal Formulation: With family (patient unable to participate at this time) Time For Goal Achievement: 04/29/23 Potential to Achieve Goals: Fair ADL Goals Pt Will Perform Upper Body Bathing: with min assist;sitting Pt Will Perform Lower Body Bathing: with mod assist;sit to/from stand;sitting/lateral leans Pt Will Perform Lower Body Dressing: with mod assist;sitting/lateral leans;sit to/from stand Pt Will Transfer to Toilet: with mod assist;bedside commode (step-pivot transfer with least restrictive AD) Pt Will Perform Toileting - Clothing Manipulation and hygiene: with mod assist;sit to/from stand;sitting/lateral leans Pt/caregiver will Perform Home Exercise Program: Increased strength;Both right and left upper extremity;With written HEP provided (AROM/AAROM; Increased activity tolerance; with Mod assist; with family Independent in assisting patient)  Plan      Co-evaluation                 AM-PAC OT "6 Clicks" Daily Activity     Outcome Measure   Help from another person eating meals?: A Little Help from another person taking care of personal grooming?: A Lot Help from another person toileting, which includes using toliet, bedpan, or urinal?: Total Help from another person bathing (including washing, rinsing, drying)?: A Lot Help from another person to put on and taking off regular upper body clothing?: A Lot Help from another person to put on and taking off regular lower body clothing?: Total 6 Click Score: 11    End of Session Equipment Utilized During Treatment: Oxygen  OT Visit Diagnosis: Muscle weakness (generalized) (M62.81);Ataxia, unspecified (R27.0);Other symptoms and signs involving cognitive function;Other (comment)   Activity Tolerance Patient tolerated treatment well;Patient  limited by fatigue;Patient limited by lethargy   Patient Left in bed;with call bell/phone within reach;with bed alarm set;with family/visitor  present;Other (comment) (with Dietitian present)   Nurse Communication Mobility status        Time: 1105-1130 OT Time Calculation (min): 25 min  Charges: OT General Charges $OT Visit: 1 Visit OT Treatments $Self Care/Home Management : 8-22 mins $Therapeutic Exercise: 8-22 mins  Aurorah Schlachter "Orson Eva., OTR/L, MA Acute Rehab [phone]   Lendon Colonel 04/15/2023, 4:19 PM

## 2023-04-15 NOTE — Assessment & Plan Note (Addendum)
 Previously on Coreg 25 mg BID and Telmisartan 20 mg daily for HTN at home however has been hypotensive throughout admission, requiring ICU for Levophed drip. BP now stable and has been off pressors. - Continue Midodrine 15mg  TID - Ted hose

## 2023-04-15 NOTE — Assessment & Plan Note (Addendum)
Presented initially in hypoxic respiratory failure secondary to right pleural effusion. CT obtained with large R pleural effusion with pleural thickening and rind, thoracostomy tube was placed with exudative pleural fluid with MSSA empyema. Pt has remote hx MSSA bacteremia >10y ago. Initially received Ceftriaxone and Azithro (1/14), switched to CTX and Linezolid, Cefazolin (1/16-1/29), coverage expanded with Vancomycin and Zosyn due to new fever x1. Flu, covid, RSV negative. MRSA PCR negative. Bcx Ngx2d.  - Pulmonology consulted regarding CXR that appears worsened with increased cough, did not see indication for chest tube (signed off 1/29) - Transitioned back to Cefazolin today 1/31 - am CBC - Wean O2 as able, goal >90% - Robitussin q4h PRN for cough - Atarax 10mg  TID PRN for anxiety - Oxycodone 5-10mg  q4h PRN moderate-severe pain

## 2023-04-15 NOTE — Progress Notes (Signed)
04/15/2023   I have seen and evaluated the patient for complex pleural effusion.   S:  No events. Remains weak and poor PO.   O: Blood pressure 96/66, pulse 64, temperature 97.6 F (36.4 C), temperature source Oral, resp. rate 17, height 5\' 3"  (1.6 m), weight 81.6 kg, SpO2 94 %.    No distress Shallow inspiratory effort Very weak Scattered bruising +dependent anasarca  Imaging all reviewed   A:  R entrapped space from MSSA empyema s/p pleural drainage with ex vacuo; fluid has returned to the potential space but lung has same aeration  Severe protein calorie malnutrition  New ESRD now HD dependent  Severe physical deconditioning   P:  No further pleural intervention needs to be done Continue nutrition and PT efforts He will need a long time to recover and there is potential that QoL at end of recovery would not be in line with his wishes; agree with palliative consult I will see patient again Monday, call if any issues over weekend.    Myrla Halsted MD [facility] Prefer epic messenger for cross cover needs If after hours, please call E-link

## 2023-04-15 NOTE — Assessment & Plan Note (Addendum)
Hemoglobin 7.2 this morning. Stable. Likely in the setting of CKD. Receives ESA outpatient if Hgb <10.  - am CBC - transfuse if <7

## 2023-04-15 NOTE — Significant Event (Signed)
Rapid Response Event Note   Reason for Call :  Decreased MS, tremoring  Initial Focused Assessment:  Pt lying in bed with eyes open. He is having generalized tremors. He is alert but not speaking.  He will respond to pain in all extremiites but will not follow commands. Pupils 4, equal, reactive. Lungs diminished t/o, breathing is congested. Throughout exam, pt began to respond more. Will repeated stimulation, he was able to say his name, follow commands, and move all extremities.   T-99.5(R), HR-117, BP-142/61, RR-24, SpO2-100% on 2L [place].   Interventions:  CBG-168 NTS done by RT PTA RRT.  Plan of Care:  Pt is well known to RRT. His MS is labile. He had HD today. Decreased MS after HD is not new for him. Await any orders from MD. Continue to monitor pt. Please call RRT if further assistance needed.   Event Summary:   MD Notified: per bedside RN Call [phone] Arrival Time:2119 End [id number]  Terrilyn Saver, RN

## 2023-04-15 NOTE — Procedures (Signed)
Received patient in bed to unit.  Alert and oriented.  Informed consent signed and in chart.   TX duration: 3.25 hours  Patient tolerated well.  Transported back to the room  Alert, without acute distress.  Hand-off given to patient's nurse.   Access used: left ij Access issues: none  Total UF removed: 2 liters Medication(s) given: ancef   Zachary Duffel, RN [facility]

## 2023-04-15 NOTE — Plan of Care (Signed)
Noticed patient's heart rate documented as 155 on dialysis.  Called dialysis and they confirmed that it is around 112, they will document his updated heart rate.  Dialysis nurse said she had not seen it go up to 150s.

## 2023-04-15 NOTE — Assessment & Plan Note (Addendum)
 Change in mental status after HD 1/29, suspected to be delirium or 2/2 hypotension. Mental status improved throughout the night. Code stroke activated, CT head obtained that was negative.

## 2023-04-15 NOTE — Assessment & Plan Note (Addendum)
Rates controlled <110 overnight - Cardiology consulted for assistance with rate control given his complexity, signed off 1/29 - PO Metoprolol Tartrate 12.5mg  if HR >130 x4-5h if BP tolerates - continue Amiodarone - restarted Eliquis (stopped Heparin 1/31) - Cardiac monitoring - Cardiology has signed off, no cardioversion this admission

## 2023-04-15 NOTE — Assessment & Plan Note (Addendum)
 No meds at home. Controlled in the 100s here.  - CBGs 4 times daily, before meals and at bedtime - sensitive SSI with HS coverage

## 2023-04-15 NOTE — Assessment & Plan Note (Addendum)
 Echo demonstrated LVEF 50-55%, moderate LVH, RV mildly enlarged, LA and RA mildly dilated, and trivial MVR.  Received IV Lasix 80mg  initially in the ED. On lasix 40mg  daily at home. - Ted hose - Daily weights - Strict I/Os - Volume management per iHD

## 2023-04-15 NOTE — Progress Notes (Signed)
Speech Language Pathology Treatment: Dysphagia  Patient Details Name: Zachary Parrish MRN: [id number] DOB: October 12, 1940 Today's Date: 04/15/2023 Time: 4098-1191 SLP Time Calculation (min) (ACUTE ONLY): 34 min  Assessment / Plan / Recommendation Clinical Impression  No real change in clinical presentation with regard to swallow function today. Zachary Parrish was at the bedside.  Zachary Parrish was repositioned in bed. He accepted ice chips, sips of water, and bites of ice cream with delays, weak/wet and intermittent coughing during his efforts to swallow.  Recommend repeating MBS today to determine if there are patterns consistent with last week's performance. Suggested to Zachary Parrish that she come down with pt so she can view study in progress. We discussed how swallowing and eating are intertwined with overall medical condition. We talked about his functional reserve being depleted and how even small amounts of aspiration can be difficult for his body to tolerate in this state.  She verbalized understanding.  MBS is scheduled for noon today.   HPI HPI: 83 y.o. male presented to ED 1/14 with SOB and found to have AKI due to ischemic ATN r/t septic shock. PMH: HTN, T2DM, CAD, A-fib. Dx large R pleural effusion and small L pleural effusion. Chest tube in place and culture grew staph aureus. CRRT 1/16; transitioned to HD M/W/F.  MBS 1/23 aspiration thin liquids; chin tuck/small sips were preventative. Esophagram 1/24: mild-mod esophageal dysmotility; mild tapered narrowing of distal esophagus, recs for endoscopy per imaging note. Diet downgraded to dysphagia 1/nectar-thick liquids 1/27 due to inability to f/c for chin tuck, poorer toleration of diet.  Pt subsequently made NPO due to lethargy.  Cortrak in place.      SLP Plan  Continue with current plan of care      Recommendations for follow up therapy are one component of a multi-disciplinary discharge planning process, led by the attending physician.  Recommendations  may be updated based on patient status, additional functional criteria and insurance authorization.    Recommendations  Diet recommendations: NPO                  Oral care QID   Frequent or constant Supervision/Assistance Dysphagia, oropharyngeal phase (R13.12)     Continue with current plan of care    Zachary Parrish L. Samson Frederic, MA CCC/SLP Clinical Specialist - Acute Care SLP Acute Rehabilitation Services Office number [phone]  [name]  04/15/2023, 10:33 AM

## 2023-04-15 NOTE — Assessment & Plan Note (Addendum)
 Ischemic ATN in setting of septic shock. US obtained with no sign of obstruction. Has had refractory hyperkalemia throughout admission. Received IHD last night due to refractory hyperkalemia and anuria (CRRT 1/15-1/21). Nephrology planning for dialysis MWF - Nephrology following, appreciate recs - stopped Lokelma 10g BID (previously refractory hyperkalemia, has been stable the last few days) - renal diet - Strict I/O - daily weights - am RFP, Mag

## 2023-04-15 NOTE — Progress Notes (Signed)
   Inpatient Rehab Admissions Coordinator :  Per MD inquiry,  patient was screened for CIR candidacy by Ottie Glazier RN MSN. Patient is not at a level to tolerate the intensity required to pursue a CIR admit . Transfer up using [facility] sky lift. Other rehab venues should be pursued. SNF is current therapy recommendation. Please contact me with any questions.  Ottie Glazier RN MSN Admissions Coordinator [phone]

## 2023-04-15 NOTE — Evaluation (Signed)
Modified Barium Swallow Study  Patient Details  Name: Zachary Parrish MRN: [id number] Date of Birth: 12-04-1940  Today's Date: 04/15/2023  Modified Barium Swallow completed.  Full report located under Chart Review in the Imaging Section.  History of Present Illness 83 y.o. male presented to ED 1/14 with SOB and found to have AKI due to ischemic ATN r/t septic shock. PMH: HTN, T2DM, CAD, A-fib. Dx large R pleural effusion and small L pleural effusion. Chest tube in place and culture grew staph aureus. CRRT 1/16; transitioned to HD M/W/F.  MBS 1/23 aspiration thin liquids; chin tuck/small sips were preventative. Esophagram 1/24: mild-mod esophageal dysmotility; mild tapered narrowing of distal esophagus, recs for endoscopy per imaging note. Diet downgraded to dysphagia 1/nectar-thick liquids 1/27 due to inability to f/c for chin tuck, poorer toleration of diet.  Pt subsequently made NPO due to lethargy.  Cortrak in place.   Clinical Impression Pt presents with a primary esophageal dysphagia. Results appear consistent with MBS from 1/23 - esophageal sweeps revealed significant retention of barium within the esophagus with retrograde flow visualized through the PES and filling the pyriform sinuses. The oral phase of the swallow was disorganized with oral delays, barium escape within oral cavity, delayed initiation of tongue movement - all of which are consistent with clinical presentation this last week and related to mental status.  Laryngeal vestibule closure and pharyngeal contraction were relatively normal. Pt had occasional penetration of thin liquids into the vestibule, but no gross aspiration.   The primary issue is related to esophageal dysfunction with potential for post-prandial aspiration.  Zachary Parrish was present for the study. We talked about the results, the concerns related to mental status, being able to take in enough nutrition to meet his needs, and the ways in which his esophageal dysfunction  is contributing to the problem.  Per the notes, there is a palliative meeting pending.  She voices feeling discouraged about the future -encouraged her to speak with Palliative Care about her concerns and the options. In the interim, recommend allowing full liquid diet with small sips/bites for pleasure; keep upright for all intake and 30 min after.  SLP will follow.  Factors that may increase risk of adverse event in presence of aspiration [name] [employer] Clearance Coots 2021): Limited mobility;Frail or deconditioned;Dependence for feeding and/or oral hygiene;Weak cough;Presence of tubes (ETT, trach, NG, etc.);Respiratory or GI disease  Swallow Evaluation Recommendations Recommendations: PO diet PO Diet Recommendation: Full liquid diet Liquid Administration via: Cup;Straw Medication Administration: Via alternative means Supervision: Full assist for feeding Swallowing strategies  : Slow rate;Small bites/sips;Follow solids with liquids Postural changes: Position pt fully upright for meals;Stay upright 30-60 min after meals Oral care recommendations: Oral care BID (2x/day)    Dijon Cosens L. Samson Frederic, MA CCC/SLP Clinical Specialist - Acute Care SLP Acute Rehabilitation Services Office number [phone]   [name] 04/15/2023,1:58 PM

## 2023-04-15 NOTE — Progress Notes (Signed)
Patient ID: Zachary Parrish, male   DOB: 07-May-1940, 83 y.o.   MRN: [id number]   S: no UOP-  crt higher-  due for HD today -  he is not very talkative-  does not remember events of Wednesday     O:BP 124/64 (BP Location: Left Arm)   Pulse 85   Temp 97.8 F (36.6 C) (Oral)   Resp 20   Ht 6\' 1"  (1.854 m)   Wt 99.6 kg   SpO2 98%   BMI 28.97 kg/m   Intake/Output Summary (Last 24 hours) at 04/15/2023 1052 Last data filed at 04/15/2023 0050 Gross per 24 hour  Intake 90 ml  Output 0 ml  Net 90 ml   Intake/Output: I/O last 3 completed shifts: In: 2347.5 [I.V.:375.6; NG/GT:1473.3; IV Piggyback:498.6] Out: 0   Intake/Output this shift:  No intake/output data recorded. Weight change: -0.9 kg Gen: chronically ill-appearing CVS: RRR Resp: occ rhonchi Abd: +BS, soft, NT/ND Ext: 2+ edema  Recent Labs  Lab 04/10/23 0357 04/10/23 1600 04/11/23 0410 04/12/23 0506 04/13/23 0146 04/14/23 0630 04/15/23 0325  NA 126* 124* 125* 126* 125* 130* 126*  K 5.7* 6.1* 4.1 4.6 4.9 4.0 4.1  CL 87* 85* 89* 87* 85* 90* 87*  CO2 24 23 27 26 24 26 24   GLUCOSE 145* 258* 172* 224* 157* 217* 152*  BUN 118* 135* 68* 118* 141* 94* 110*  CREATININE 5.83* 6.34* 3.58* 5.27* 6.23* 4.41* 5.20*  ALBUMIN 1.9* 1.9* 1.8* 1.8* 1.9* 1.9* 2.9*  CALCIUM 8.1* 7.9* 7.4* 7.7* 7.9* 7.8* 8.1*  PHOS 7.0* 7.2* 4.7* 4.5 6.2* 3.2 4.7*   Liver Function Tests: Recent Labs  Lab 04/13/23 0146 04/14/23 0630 04/15/23 0325  ALBUMIN 1.9* 1.9* 2.9*   No results for input(s): "LIPASE", "AMYLASE" in the last 168 hours. No results for input(s): "AMMONIA" in the last 168 hours. CBC: Recent Labs  Lab 04/11/23 0410 04/12/23 0506 04/12/23 1441 04/13/23 0146 04/14/23 0630 04/15/23 0325  WBC 14.7* 11.5*  --  13.7* 11.7* 13.7*  HGB 7.1* 6.5*   < > 8.0* 7.0* 7.2*  HCT 21.4* 20.0*   < > 24.5* 21.2* 22.0*  MCV 100.5* 99.0  --  97.2 97.7 98.7  PLT 268 230  --  232 181 186   < > = values in this interval not displayed.   Cardiac  Enzymes: No results for input(s): "CKTOTAL", "CKMB", "CKMBINDEX", "TROPONINI" in the last 168 hours. CBG: Recent Labs  Lab 04/14/23 0640 04/14/23 1132 04/14/23 1621 04/14/23 2130 04/15/23 0712  GLUCAP 227* 191* 204* 146* 167*    Iron Studies: No results for input(s): "IRON", "TIBC", "TRANSFERRIN", "FERRITIN" in the last 72 hours. Studies/Results: DG Chest 1 View Result Date: 04/14/2023 CLINICAL DATA:  Cough and shortness of breath EXAM: PORTABLE CHEST 1 VIEW COMPARISON:  Film from earlier in the same day. FINDINGS: Cardiac shadow is enlarged. Feeding catheter is noted extending into the stomach. Left jugular temporary dialysis catheter is noted. Bilateral pleural effusions are noted right greater than left. No new focal infiltrate or effusion is seen. No bony abnormality is noted. IMPRESSION: Effusions right greater than left stable from the prior exam. Tubes and lines as described. Electronically Signed   By: Alcide Clever M.D.   On: 04/14/2023 00:00   CT HEAD WO CONTRAST ( ) Result Date: 04/13/2023 CLINICAL DATA:  Mental status change, unknown cause EXAM: CT HEAD WITHOUT CONTRAST TECHNIQUE: Contiguous axial images were obtained from the base of the skull through the vertex without intravenous contrast. RADIATION  DOSE REDUCTION: This exam was performed according to the departmental dose-optimization program which includes automated exposure control, adjustment of the mA and/or kV according to patient size and/or use of iterative reconstruction technique. COMPARISON:  None Available. FINDINGS: Motion limited study.  Within this limitation: Brain: No evidence of acute infarction, hemorrhage, hydrocephalus, extra-axial collection or mass lesion/mass effect. Cerebral atrophy. Vascular: No hyperdense vessel. Skull: No acute fracture. Sinuses/Orbits: Left maxillary sinus, anterior left ethmoid air cell, and left frontal sinus mucosal thickening. No acute orbital findings. Other: No mastoid effusions.  IMPRESSION: No evidence of acute intracranial abnormality on motion limited assessment. Electronically Signed   By: Feliberto Harts M.D.   On: 04/13/2023 21:11    sodium chloride   Intravenous Once   amiodarone  400 mg Per Tube BID   aspirin  81 mg Per Tube Daily   atorvastatin  40 mg Per Tube Daily   Chlorhexidine Gluconate Cloth  6 each Topical Daily   Chlorhexidine Gluconate Cloth  6 each Topical Q0600   darbepoetin (ARANESP) injection - DIALYSIS  60 mcg Subcutaneous Q Wed-1800   feeding supplement (PROSource TF20)  60 mL Per Tube TID   folic acid  1 mg Per Tube Daily   Gerhardt's butt cream   Topical TID   insulin aspart  0-5 Units Subcutaneous QHS   insulin aspart  0-9 Units Subcutaneous TID WC   lidocaine  1 patch Transdermal Q24H   metoCLOPramide (REGLAN) injection  5 mg Intravenous Q8H   midodrine  15 mg Per Tube TID WC   multivitamin  1 tablet Oral BID   nicotine  21 mg Transdermal Daily   mouth rinse  15 mL Mouth Rinse 4 times per day   thiamine  100 mg Per Tube Daily    BMET    Component Value Date/Time   NA 126 (L) 04/15/2023 0325   K 4.1 04/15/2023 0325   CL 87 (L) 04/15/2023 0325   CO2 24 04/15/2023 0325   GLUCOSE 152 (H) 04/15/2023 0325   BUN 110 (H) 04/15/2023 0325   CREATININE 5.20 (H) 04/15/2023 0325   CALCIUM 8.1 (L) 04/15/2023 0325   GFRNONAA 10 (L) 04/15/2023 0325   GFRAA 30 (L) 06/07/2019 0321   CBC    Component Value Date/Time   WBC 13.7 (H) 04/15/2023 0325   RBC 2.23 (L) 04/15/2023 0325   HGB 7.2 (L) 04/15/2023 0325   HCT 22.0 (L) 04/15/2023 0325   PLT 186 04/15/2023 0325   MCV 98.7 04/15/2023 0325   MCH 32.3 04/15/2023 0325   MCHC 32.7 04/15/2023 0325   RDW 18.5 (H) 04/15/2023 0325   LYMPHSABS 0.4 (L) 03/30/2023 0251   MONOABS 1.1 (H) 03/30/2023 0251   EOSABS 0.0 03/30/2023 0251   BASOSABS 0.0 03/30/2023 0251     Pt is a 83 y.o. yo male  with PMH significant for hypertension, type II DM, CAD, A-fib, CKD (f/b Dr. Glenna Fellows) was admitted  with shortness of breath, seen as a consultation for the evaluation and management of acute kidney injury.    Assessment/Plan:   AKI/CKD stage IV - ischemic ATN in setting of septic shock.  [place] without obstruction.  Refractory to diuretics and hypotension, started on CRRT 1/15-1/21/25 after temp HD catheter placed by PCCM.  Off of CRRT and had IHD last  1/29 due to refractory hyperkalemia and anuria.  Will continue to follow for signs of renal recovery.  Will plan for HD again today and keep on MWF schedule for now.  I also am concerned that he is not really improving and having issues tolerating HD.  I had a frank conversation with he and his wife regarding this and they are amenable to speaking with Palliative care.  Do not seem to be considering anything like comfort care at this time Vascular access - Left internal jugular temp HD catheter placed 04/08/23 --  will need TDC if he is to continue with HD.  Can keep temp cath in right now has only been in 6 days til final determination made-  possibly for Monday if dialysis goes ok today  Septic shock due to staph  bacteremia - per PCCM Staph aureus empyema/Right pleural effusion - s/p chest tube.  ID following and on cefazolin. Acute RV failure with CHF -  UF as tolerated with HD.   Chronic atrial fibrillation with RVR- possible DCCV when more stable.  On amiodarone and heparin drip.  Metoprolol 12.5 mg started last night.  Cardiology signed off.  Acute on chronic Anemia - transfuse for Hgb <7.  Received blood 1/28 due to Hgb of 6.5.  Improved to 8 today.  Continue to follow. Added ESA. 60 weekly  Severe protein malnutrition - per primary Disposition - poor overall prognosis.  Recommend palliative care consult to set goals/limits of care.  He is a marginal HD candidate given advanced age, biventricular heart failure, hypotension, multiple co-morbidities, and poor functional and nutritional status.  Seems like right now to continue with full care    Cecille Aver  [facility]

## 2023-04-16 ENCOUNTER — Inpatient Hospital Stay (HOSPITAL_COMMUNITY): Payer: Medicare Other | Admitting: Certified Registered Nurse Anesthetist

## 2023-04-16 ENCOUNTER — Inpatient Hospital Stay (HOSPITAL_COMMUNITY): Payer: Medicare Other

## 2023-04-16 DIAGNOSIS — Z7189 Other specified counseling: Secondary | ICD-10-CM

## 2023-04-16 DIAGNOSIS — I4891 Unspecified atrial fibrillation: Secondary | ICD-10-CM | POA: Diagnosis not present

## 2023-04-16 DIAGNOSIS — Z515 Encounter for palliative care: Secondary | ICD-10-CM

## 2023-04-16 DIAGNOSIS — R251 Tremor, unspecified: Secondary | ICD-10-CM | POA: Insufficient documentation

## 2023-04-16 DIAGNOSIS — J869 Pyothorax without fistula: Secondary | ICD-10-CM | POA: Diagnosis not present

## 2023-04-16 DIAGNOSIS — L8990 Pressure ulcer of unspecified site, unspecified stage: Secondary | ICD-10-CM | POA: Insufficient documentation

## 2023-04-16 DIAGNOSIS — Z992 Dependence on renal dialysis: Secondary | ICD-10-CM

## 2023-04-16 DIAGNOSIS — A4901 Methicillin susceptible Staphylococcus aureus infection, unspecified site: Secondary | ICD-10-CM | POA: Diagnosis not present

## 2023-04-16 DIAGNOSIS — K5900 Constipation, unspecified: Secondary | ICD-10-CM | POA: Insufficient documentation

## 2023-04-16 LAB — CBC
HCT: 22.8 % — ABNORMAL LOW (ref 39.0–52.0)
Hemoglobin: 7.4 g/dL — ABNORMAL LOW (ref 13.0–17.0)
MCH: 32.9 pg (ref 26.0–34.0)
MCHC: 32.5 g/dL (ref 30.0–36.0)
MCV: 101.3 fL — ABNORMAL HIGH (ref 80.0–100.0)
Platelets: 201 10*3/uL (ref 150–400)
RBC: 2.25 MIL/uL — ABNORMAL LOW (ref 4.22–5.81)
RDW: 18.8 % — ABNORMAL HIGH (ref 11.5–15.5)
WBC: 13.4 10*3/uL — ABNORMAL HIGH (ref 4.0–10.5)
nRBC: 0.2 % (ref 0.0–0.2)

## 2023-04-16 LAB — RENAL FUNCTION PANEL
Albumin: 2.7 g/dL — ABNORMAL LOW (ref 3.5–5.0)
Anion gap: 13 (ref 5–15)
BUN: 69 mg/dL — ABNORMAL HIGH (ref 8–23)
CO2: 28 mmol/L (ref 22–32)
Calcium: 8.3 mg/dL — ABNORMAL LOW (ref 8.9–10.3)
Chloride: 91 mmol/L — ABNORMAL LOW (ref 98–111)
Creatinine, Ser: 3.72 mg/dL — ABNORMAL HIGH (ref 0.61–1.24)
GFR, Estimated: 16 mL/min — ABNORMAL LOW (ref >60)
Glucose, Bld: 181 mg/dL — ABNORMAL HIGH (ref 70–99)
Phosphorus: 3.7 mg/dL (ref 2.5–4.6)
Potassium: 4.1 mmol/L (ref 3.5–5.1)
Sodium: 132 mmol/L — ABNORMAL LOW (ref 135–145)

## 2023-04-16 LAB — MAGNESIUM: Magnesium: 2.1 mg/dL (ref 1.7–2.4)

## 2023-04-16 LAB — GLUCOSE, CAPILLARY
Glucose-Capillary: 167 mg/dL — ABNORMAL HIGH (ref 70–99)
Glucose-Capillary: 174 mg/dL — ABNORMAL HIGH (ref 70–99)
Glucose-Capillary: 138 mg/dL — ABNORMAL HIGH (ref 70–99)
Glucose-Capillary: 154 mg/dL — ABNORMAL HIGH (ref 70–99)
Glucose-Capillary: 164 mg/dL — ABNORMAL HIGH (ref 70–99)

## 2023-04-16 LAB — LACTIC ACID, PLASMA
Lactic Acid, Venous: 0.7 mmol/L (ref 0.5–1.9)
Lactic Acid, Venous: 0.5 mmol/L (ref 0.5–1.9)

## 2023-04-16 MED ORDER — AMIODARONE HCL 200 MG PO TABS
400.0000 mg | ORAL_TABLET | Freq: Two times a day (BID) | ORAL | Status: DC
Start: 2023-04-16 — End: 2023-04-17
  Administered 2023-04-16 – 2023-04-17 (×2): 400 mg via ORAL
  Filled 2023-04-16 (×2): qty 2

## 2023-04-16 MED ORDER — SENNA 8.6 MG PO TABS
1.0000 | ORAL_TABLET | Freq: Every day | ORAL | Status: DC
  Administered 2023-04-16 – 2023-04-17 (×2): 8.6 mg via ORAL
  Filled 2023-04-16 (×2): qty 1

## 2023-04-16 MED ORDER — SODIUM CHLORIDE 3 % IN NEBU
4.0000 mL | INHALATION_SOLUTION | Freq: Two times a day (BID) | RESPIRATORY_TRACT | Status: DC | PRN
  Administered 2023-04-16: 4 mL via RESPIRATORY_TRACT
  Filled 2023-04-16 (×3): qty 4

## 2023-04-16 MED ORDER — BISACODYL 10 MG RE SUPP
10.0000 mg | Freq: Every day | RECTAL | Status: DC | PRN
  Administered 2023-04-16: 10 mg via RECTAL
  Filled 2023-04-16: qty 1

## 2023-04-16 MED ORDER — ATORVASTATIN CALCIUM 40 MG PO TABS
40.0000 mg | ORAL_TABLET | Freq: Every day | ORAL | Status: DC
  Administered 2023-04-17: 40 mg via ORAL
  Filled 2023-04-16: qty 1

## 2023-04-16 MED ORDER — GUAIFENESIN-DM 100-10 MG/5ML PO SYRP: 5.0000 mL | ORAL_SOLUTION | ORAL | Status: DC | PRN

## 2023-04-16 MED ORDER — FOLIC ACID 1 MG PO TABS
1.0000 mg | ORAL_TABLET | Freq: Every day | ORAL | Status: DC
  Administered 2023-04-17: 1 mg via ORAL
  Filled 2023-04-16: qty 1

## 2023-04-16 MED ORDER — VITAL 1.5 CAL PO LIQD
1000.0000 mL | ORAL | Status: DC
  Administered 2023-04-16 (×2): 1000 mL
  Filled 2023-04-16 (×2): qty 1000

## 2023-04-16 MED ORDER — MIDODRINE HCL 5 MG PO TABS
15.0000 mg | ORAL_TABLET | Freq: Three times a day (TID) | ORAL | Status: DC
Start: 2023-04-16 — End: 2023-04-17
  Administered 2023-04-16 – 2023-04-17 (×2): 15 mg via ORAL
  Filled 2023-04-16 (×2): qty 3

## 2023-04-16 MED ORDER — THIAMINE MONONITRATE 100 MG PO TABS
100.0000 mg | ORAL_TABLET | Freq: Every day | ORAL | Status: DC
  Administered 2023-04-17: 100 mg via ORAL
  Filled 2023-04-16: qty 1

## 2023-04-16 MED ORDER — ASPIRIN 81 MG PO CHEW
81.0000 mg | CHEWABLE_TABLET | Freq: Every day | ORAL | Status: DC
  Administered 2023-04-17: 81 mg via ORAL
  Filled 2023-04-16: qty 1

## 2023-04-16 MED ORDER — GUAIFENESIN ER 600 MG PO TB12: 600.0000 mg | ORAL_TABLET | Freq: Two times a day (BID) | ORAL | Status: DC | PRN

## 2023-04-16 MED ORDER — POLYETHYLENE GLYCOL 3350 17 G PO PACK
17.0000 g | PACK | Freq: Two times a day (BID) | ORAL | Status: DC
  Administered 2023-04-16 (×2): 17 g via ORAL
  Filled 2023-04-16 (×2): qty 1

## 2023-04-16 MED ORDER — PROSOURCE TF20 ENFIT COMPATIBL EN LIQD
60.0000 mL | Freq: Two times a day (BID) | ENTERAL | Status: DC
  Administered 2023-04-16: 60 mL
  Filled 2023-04-16: qty 60

## 2023-04-16 NOTE — Assessment & Plan Note (Signed)
 Previously on Coreg 25 mg BID and Telmisartan 20 mg daily for HTN at home however has been hypotensive throughout admission, requiring ICU for Levophed drip. BP now stable and has been off pressors. - Continue Midodrine 15mg  TID - Ted hose

## 2023-04-16 NOTE — Assessment & Plan Note (Signed)
Full body tremoring at rest, ongoing since admission and possibly secondary to electrolyte abnormalities from intolerance of dialysis.  Low concern for seizure activity giving ongoing nature. -Monitor RFP and adjust electrolytes as indicated

## 2023-04-16 NOTE — Plan of Care (Signed)
Spoke with ID on-call, Dr. Elinor Parkinson, about patient clinical status and elevated temp overnight.  She recommended continuing on the cefazolin for MSSA empyema coverage and any further fevering would likely need further source control with pulmonology.  She does not recommend changing antibiotics at this time.  No further indication for ID care of this patient at this time.  Elberta Fortis, MD 04/16/2023, 1:57 PM PGY-2, [facility] Family Medicine Service pager [phone]

## 2023-04-16 NOTE — Progress Notes (Addendum)
  Patient Name: Kaio Kuhlman   MRN: [id number]   Date of Birth/ Sex: 09/08/1940 , male      Admission Date: 03/29/2023  Attending Provider: Westley Chandler, MD  Primary Diagnosis: Empyema of right pleural space [facility])   Indication: Pt was in his usual state of health until this PM, when he was noted to be hypoxic and went into PEA arrest. Code blue was subsequently called. At the time of arrival on scene, ACLS protocol was underway.   Technical Description:  - CPR performance duration:  4 minutes  - Was defibrillation or cardioversion used? No   - Was external pacer placed? No  - Was patient intubated pre/post CPR? Yes   Medications Administered: Y = Yes; Blank = No Amiodarone    Atropine  Y  Calcium    Epinephrine  Y  Lidocaine    Magnesium    Norepinephrine    Phenylephrine    Sodium bicarbonate    Vasopressin     Post CPR evaluation:  - Final Status - Was patient successfully resuscitated ? Yes - What is current rhythm? Sinus tachycardia  - What is current hemodynamic status? Stable   Miscellaneous Information:  - Labs sent, including: N/A  - Primary team notified?  Yes  - Family Notified? Yes  - Additional notes/ transfer status: To ICU     Faith Rogue, DO  04/16/2023, 11:55 PM

## 2023-04-16 NOTE — Progress Notes (Signed)
Patient ID: Zachary Parrish, male   DOB: [birth year], 83 y.o.   MRN: [id number]   S: no UOP-  s/p HD yesterday-  removed 2 liters -  again had MS change - fever-  brief A fib all resolved later.  Labs reflective of dialysis.   When I come into room-  he is not really responsive -  wife and ?son at bedside    O:BP (!) 131/56 (BP Location: Left Arm)   Pulse (!) 101   Temp 98.5 F (36.9 C) (Oral)   Resp 20   Ht 6\' 1"  (1.854 m)   Wt 99.5 kg   SpO2 97%   BMI 28.94 kg/m   Intake/Output Summary (Last 24 hours) at 04/16/2023 4098 Last data filed at 04/16/2023 0130 Gross per 24 hour  Intake 2154.05 ml  Output 2 ml  Net 2152.05 ml   Intake/Output: I/O last 3 completed shifts: In: 2244.1 [NG/GT:2200.8; IV Piggyback:43.2] Out: 2 [Other:2]  Intake/Output this shift:  No intake/output data recorded. Weight change: -2 kg Gen: chronically ill-appearing-  not really responsive to stimuli-  tremulous CVS: RRR Resp:  rhonchi Abd: +BS, soft, NT/ND Ext: 2+ edema  Recent Labs  Lab 04/10/23 1600 04/11/23 0410 04/12/23 0506 04/13/23 0146 04/14/23 0630 04/15/23 0325 04/16/23 0145  NA 124* 125* 126* 125* 130* 126* 132*  K 6.1* 4.1 4.6 4.9 4.0 4.1 4.1  CL 85* 89* 87* 85* 90* 87* 91*  CO2 23 27 26 24 26 24 28   GLUCOSE 258* 172* 224* 157* 217* 152* 181*  BUN 135* 68* 118* 141* 94* 110* 69*  CREATININE 6.34* 3.58* 5.27* 6.23* 4.41* 5.20* 3.72*  ALBUMIN 1.9* 1.8* 1.8* 1.9* 1.9* 2.9* 2.7*  CALCIUM 7.9* 7.4* 7.7* 7.9* 7.8* 8.1* 8.3*  PHOS 7.2* 4.7* 4.5 6.2* 3.2 4.7* 3.7   Liver Function Tests: Recent Labs  Lab 04/14/23 0630 04/15/23 0325 04/16/23 0145  ALBUMIN 1.9* 2.9* 2.7*   No results for input(s): "LIPASE", "AMYLASE" in the last 168 hours. No results for input(s): "AMMONIA" in the last 168 hours. CBC: Recent Labs  Lab 04/12/23 0506 04/12/23 1441 04/13/23 0146 04/14/23 0630 04/15/23 0325 04/16/23 0145  WBC 11.5*  --  13.7* 11.7* 13.7* 13.4*  HGB 6.5*   < > 8.0* 7.0* 7.2* 7.4*   HCT 20.0*   < > 24.5* 21.2* 22.0* 22.8*  MCV 99.0  --  97.2 97.7 98.7 101.3*  PLT 230  --  232 181 186 201   < > = values in this interval not displayed.   Cardiac Enzymes: No results for input(s): "CKTOTAL", "CKMB", "CKMBINDEX", "TROPONINI" in the last 168 hours. CBG: Recent Labs  Lab 04/15/23 1845 04/15/23 2124 04/15/23 2201 04/16/23 0115 04/16/23 0656  GLUCAP 175* 168* 185* 167* 174*    Iron Studies: No results for input(s): "IRON", "TIBC", "TRANSFERRIN", "FERRITIN" in the last 72 hours. Studies/Results: DG CHEST PORT 1 VIEW Result Date: 04/16/2023 CLINICAL DATA:  Tachycardia.  Possible aspiration. EXAM: PORTABLE CHEST 1 VIEW COMPARISON:  04/13/2023 FINDINGS: Left central venous catheter with tip over the low SVC region. No pneumothorax. Enteric tube is present with tip off the field of view but below the left hemidiaphragm. Shallow inspiration. Cardiac enlargement. Bilateral pleural effusions, greater on the right. Atelectasis or infiltration in the lung bases. Calcification of the aorta. Similar appearance to previous study. IMPRESSION: Cardiac enlargement. Bilateral pleural effusions with basilar atelectasis, greater on the right. No change. Electronically Signed   By: Burman Nieves M.D.   On: 04/16/2023 01:51  DG Swallowing Func-Speech Pathology Result Date: 04/15/2023 Table formatting from the original result was not included. Modified Barium Swallow Study Patient Details Name: Zachary Parrish MRN: [id number] Date of Birth: 1940/09/12 Today's Date: 04/15/2023 HPI/PMH: HPI: 83 y.o. male presented to ED 1/14 with SOB and found to have AKI due to ischemic ATN r/t septic shock. PMH: HTN, T2DM, CAD, A-fib. Dx large R pleural effusion and small L pleural effusion. Chest tube in place and culture grew staph aureus. CRRT 1/16; transitioned to HD M/W/F.  MBS 1/23 aspiration thin liquids; chin tuck/small sips were preventative. Esophagram 1/24: mild-mod esophageal dysmotility; mild tapered  narrowing of distal esophagus, recs for endoscopy per imaging note. Diet downgraded to dysphagia 1/nectar-thick liquids 1/27 due to inability to f/c for chin tuck, poorer toleration of diet.  Pt subsequently made NPO due to lethargy.  Cortrak in place. Clinical Impression: Clinical Impression: Pt presents with a primary esophageal dysphagia. Results appear consistent with MBS from 1/23 - esophageal sweeps revealed significant retention of barium within the esophagus with retrograde flow visualized through the PES and filling the pyriform sinuses. The oral phase of the swallow was disorganized with oral delays, barium escape within oral cavity, delayed initiation of tongue movement - all of which are consistent with clinical presentation this last week and related to mental status.  Laryngeal vestibule closure and pharyngeal contraction were relatively normal. Pt had occasional penetration of thin liquids into the vestibule, but no gross aspiration. The primary issue is related to esophageal dysfunction with potential for post-prandial aspiration.  Mrs. Meals was present for the study. We talked about the results, the concerns related to mental status, being able to take in enough nutrition to meet his needs, and the ways in which his esophageal dysfunction is contributing to the problem.  Per the notes, there is a palliative meeting pending.  She voices feeling discouraged about the future -encouraged her to speak with Palliative Care about her concerns and the options. In the interim, recommend allowing full liquid diet with small sips/bites for pleasure; keep upright for all intake and 30 min after.  SLP will follow. Factors that may increase risk of adverse event in presence of aspiration Zachary Parrish & Zachary Parrish 2021): Factors that may increase risk of adverse event in presence of aspiration Zachary Parrish & Zachary Parrish 2021): Limited mobility; Frail or deconditioned; Dependence for feeding and/or oral hygiene; Weak cough;  Presence of tubes (ETT, trach, NG, etc.); Respiratory or GI disease Recommendations/Plan: Swallowing Evaluation Recommendations Swallowing Evaluation Recommendations Recommendations: PO diet PO Diet Recommendation: Full liquid diet Liquid Administration via: Cup; Straw Medication Administration: Via alternative means Supervision: Full assist for feeding Swallowing strategies  : Slow rate; Small bites/sips; Follow solids with liquids Postural changes: Position pt fully upright for meals; Stay upright 30-60 min after meals Oral care recommendations: Oral care BID (2x/day) Treatment Plan Treatment Plan Treatment recommendations: Therapy as outlined in treatment plan below Follow-up recommendations: Skilled nursing-short term rehab (<3 hours/day) Functional status assessment: Patient has had a recent decline in their functional status and demonstrates the ability to make significant improvements in function in a reasonable and predictable amount of time. Treatment frequency: Min 2x/week Treatment duration: 2 weeks Interventions: Diet toleration management by SLP; Patient/family education Recommendations Recommendations for follow up therapy are one component of a multi-disciplinary discharge planning process, led by the attending physician.  Recommendations may be updated based on patient status, additional functional criteria and insurance authorization. Assessment: Orofacial Exam: Orofacial Exam Oral Cavity: Oral Hygiene: WFL Oral Cavity -  Dentition: Dentures, top; Dentures, bottom Orofacial Anatomy: WFL Oral Motor/Sensory Function: WFL Anatomy: Anatomy: WFL Boluses Administered: Boluses Administered Boluses Administered: Thin liquids (Level 0); Mildly thick liquids (Level 2, nectar thick); Puree  Oral Impairment Domain: Oral Impairment Domain Lip Closure: Escape progressing to mid-chin Tongue control during bolus hold: Escape to lateral buccal cavity/floor of mouth Bolus preparation/mastication: Slow prolonged  chewing/mashing with complete recollection Bolus transport/lingual motion: Delayed initiation of tongue motion (oral holding) Oral residue: Residue collection on oral structures Location of oral residue : Tongue; Lateral sulci Initiation of pharyngeal swallow : Posterior angle of the ramus  Pharyngeal Impairment Domain: Pharyngeal Impairment Domain Soft palate elevation: No bolus between soft palate (SP)/pharyngeal wall (PW) Laryngeal elevation: Complete superior movement of thyroid cartilage with complete approximation of arytenoids to epiglottic petiole Anterior hyoid excursion: Partial anterior movement Epiglottic movement: Complete inversion Laryngeal vestibule closure: Incomplete, narrow column air/contrast in laryngeal vestibule Pharyngeal stripping wave : Present - complete Pharyngeal contraction (A/P view only): N/A Pharyngoesophageal segment opening: Complete distension and complete duration, no obstruction of flow Tongue base retraction: Trace column of contrast or air between tongue base and PPW Pharyngeal residue: Complete pharyngeal Zachary  Esophageal Impairment Domain: Esophageal Impairment Domain Esophageal Zachary upright position: Esophageal retention with retrograde flow through the PES Pill: Pill Consistency administered: -- (not tested) Penetration/Aspiration Scale Score: Penetration/Aspiration Scale Score 1.  Material does not enter airway: Mildly thick liquids (Level 2, nectar thick); Puree 3.  Material enters airway, remains ABOVE vocal cords and not ejected out: Thin liquids (Level 0) Compensatory Strategies: Compensatory Strategies Compensatory strategies: Yes Straw: Effective   General Information: Caregiver present: Yes  Diet Prior to this Study: NPO   Temperature : Normal   No data recorded  Supplemental O2: Nasal cannula   History of Recent Intubation: No  Behavior/Cognition: Alert; Cooperative; Pleasant mood Self-Feeding Abilities: Able to self-feed Baseline vocal quality/speech:  Normal Volitional Cough: Able to elicit Volitional Swallow: Able to elicit Exam Limitations: Limited visibility Goal Planning: No data recorded No data recorded No data recorded No data recorded Consulted and agree with results and recommendations: Family member/caregiver; Nurse; Physician Pain: Pain Assessment Pain Assessment: No/denies pain Faces Pain Scale: 4 Pain Location: lower back; R knee with ROM Pain Descriptors / Indicators: Grimacing; Sore Pain Intervention(s): Limited activity within patient's tolerance; Monitored during session; Repositioned End of Session: Start Time:SLP Start Time (ACUTE ONLY): 1205 Stop Time: SLP Stop Time (ACUTE ONLY): 1244 Time Calculation:SLP Time Calculation (min) (ACUTE ONLY): 39 min Charges: SLP Evaluations $ SLP Speech Visit: 1 Visit SLP Evaluations $MBS Swallow: 1 Procedure $Swallowing Treatment: 1 Procedure SLP visit diagnosis: SLP Visit Diagnosis: Dysphagia, pharyngoesophageal phase (R13.14) Past Medical History: Past Medical History: Diagnosis Date  Acute renal failure (ARF) (HCC) 05/31/2019  B12 deficiency 08/02/2019  Bilateral carotid artery stenosis 05/31/2021  CAD (coronary artery disease) 2014  60-70% calcified LM lesion, high grade OM lesion, treated medically.  Chronic kidney disease, stage IV (severe) (HCC) 07/19/2019  Coronary artery disease involving native coronary artery of native heart without angina pectoris 07/19/2019  DM (diabetes mellitus) (HCC)   no longer on medications  Dyslipidemia 11/22/2019  Essential hypertension 07/19/2019  Gout   Gouty arthritis 09/06/2019  HTN (hypertension)   MSSA (methicillin susceptible Staphylococcus aureus) septicemia (HCC) 06/09/2012  Nonrheumatic aortic valve insufficiency 07/19/2019  OSA (obstructive sleep apnea) 04/19/2013  Dr Jerre Simon    PAF (paroxysmal atrial fibrillation) (HCC) 06/03/2019  during admission for DRESS  Septic shock (HCC) 05/31/2019  Shock (HCC) 05/31/2019 Past Surgical  History: Past Surgical History:  Procedure Laterality Date  CARDIAC CATHETERIZATION  2014  CARDIOVERSION N/A 06/07/2019  Procedure: CARDIOVERSION;  Surgeon: Little Ishikawa, MD;  Location: [facility] ENDOSCOPY;  Service: Cardiovascular;  Laterality: N/A;  TEE WITHOUT CARDIOVERSION N/A 06/07/2019  Procedure: TRANSESOPHAGEAL ECHOCARDIOGRAM (TEE);  Surgeon: Little Ishikawa, MD;  Location: [facility] ENDOSCOPY;  Service: Cardiovascular;  Laterality: N/A; Jill Side. Samson Frederic, MA CCC/SLP Clinical Specialist - Acute Care SLP Acute Rehabilitation Services Office number [phone] [name] 04/15/2023, 2:01 PM   sodium chloride   Intravenous Once   amiodarone  400 mg Per Tube BID   apixaban  2.5 mg Oral BID   aspirin  81 mg Per Tube Daily   atorvastatin  40 mg Per Tube Daily   Chlorhexidine Gluconate Cloth  6 each Topical Daily   Chlorhexidine Gluconate Cloth  6 each Topical Q0600   darbepoetin (ARANESP) injection - DIALYSIS  60 mcg Subcutaneous Q Wed-1800   folic acid  1 mg Per Tube Daily   Gerhardt's butt cream   Topical TID   insulin aspart  0-5 Units Subcutaneous QHS   insulin aspart  0-9 Units Subcutaneous TID WC   lidocaine  1 patch Transdermal Q24H   midodrine  15 mg Per Tube TID WC   multivitamin  1 tablet Oral BID   nicotine  21 mg Transdermal Daily   mouth rinse  15 mL Mouth Rinse 4 times per day   thiamine  100 mg Per Tube Daily    BMET    Component Value Date/Time   NA 132 (L) 04/16/2023 0145   K 4.1 04/16/2023 0145   CL 91 (L) 04/16/2023 0145   CO2 28 04/16/2023 0145   GLUCOSE 181 (H) 04/16/2023 0145   BUN 69 (H) 04/16/2023 0145   CREATININE 3.72 (H) 04/16/2023 0145   CALCIUM 8.3 (L) 04/16/2023 0145   GFRNONAA 16 (L) 04/16/2023 0145   GFRAA 30 (L) 06/07/2019 0321   CBC    Component Value Date/Time   WBC 13.4 (H) 04/16/2023 0145   RBC 2.25 (L) 04/16/2023 0145   HGB 7.4 (L) 04/16/2023 0145   HCT 22.8 (L) 04/16/2023 0145   PLT 201 04/16/2023 0145   MCV 101.3 (H) 04/16/2023 0145   MCH 32.9  04/16/2023 0145   MCHC 32.5 04/16/2023 0145   RDW 18.8 (H) 04/16/2023 0145   LYMPHSABS 0.4 (L) 03/30/2023 0251   MONOABS 1.1 (H) 03/30/2023 0251   EOSABS 0.0 03/30/2023 0251   BASOSABS 0.0 03/30/2023 0251     Pt is a 83 y.o. yo male  with PMH significant for hypertension, type II DM, CAD, A-fib, CKD (f/b Dr. Glenna Fellows) was admitted with shortness of breath, seen as a consultation for the evaluation and management of acute kidney injury.    Assessment/Plan:   AKI/CKD stage IV - ischemic ATN in setting of septic shock.  [place] without obstruction.  Refractory to diuretics and hypotension, started on CRRT 1/15-1/21/25 after temp HD catheter placed by PCCM.  Off of CRRT and had IHD  1/29 and  again 1/31 and plan was to keep on MWF schedule .  I also am concerned that he is  having issues tolerating HD now times 2.  Dr. Arrie Aran had a frank conversation with he and his wife regarding this and they are amenable to speaking with Palliative care-  has not happened yet.    Did not seem to be considering anything like comfort care per my discussion with them yesterday but now  patient worse and did not tolerate dialysis again.  I am not sure if this is the only thing going on... work up per primary team.  I have told family that we need to think long and hard if we are to put him through another dialysis treatment if this is how he reacts.  Obviously if we were to not do any more dialysis his prognosis is days and it would be reasonable to transition to comfort care  Vascular access - Left internal jugular temp HD catheter placed 04/08/23 --  will need TDC if he is to continue with HD.  Can keep temp cath in right now has only been in 7 days -  not sure we will be doing HD again  Septic shock due to staph  bacteremia - per PCCM Staph aureus empyema/Right pleural effusion - s/p chest tube.  ID following and on cefazolin. Acute RV failure with CHF -  UF as tolerated with HD.   Chronic atrial fibrillation with RVR-  possible DCCV when more stable.  On amiodarone and heparin drip.  Metoprolol 12.5 mg started last night.  Cardiology signed off.  Acute on chronic Anemia - transfuse for Hgb <7.  Received blood 1/28 due to Hgb of 6.5.  Improved to 8 today.  Continue to follow. Added ESA. 60 weekly  Severe protein malnutrition - per primary Disposition - poor overall prognosis.  Recommend palliative care consult to set goals/limits of care.  He is a marginal HD candidate given advanced age, biventricular heart failure, hypotension, multiple co-morbidities, and poor functional and nutritional status.  He has now had 2 episodes after HD req rapid response to see him-  has not recovered completely from this last episode   Cecille Aver  [employer]

## 2023-04-16 NOTE — Assessment & Plan Note (Signed)
Large volume of stool noted on abdominal x-ray, manage with suppository and MiraLAX but may need enema if no output.  Concerning for GI vs GYN surgeon intervention if unable to clear stool load.

## 2023-04-16 NOTE — Progress Notes (Addendum)
Patient sleeping . Skin warm and dry. Still having body tremors. Still has weak nonprod congested cough. Resp. Called to N.T.S. patient small amt of mucus.  Awaken decrease alertness. Mindy R.N. with rapid response called.at 21:25 And coming to see patient. Dr. Fatima Blank called  at 21:36 and call returned at 21:40. M.D. aware of the above and V.S.   Patient becoming more alert and we kept assessing him. Cont. To monitor patient. Lupita Leash R.N. aware and into see patient.  CBG done 168. SKin warm and dry.

## 2023-04-16 NOTE — Assessment & Plan Note (Addendum)
Initiated 1/17. Made NPO 1/27 due to increased weakness with chewing foods, now on full liquid.  TF stopped overnight due to tremulous state but this has been ongoing and likely not seizure activity - Restart tube feeds -Advance to full liquids per SLP - RD and SLP recs - Zofran 4mg  q6h PRN nausea/vomiting

## 2023-04-16 NOTE — Anesthesia Procedure Notes (Signed)
Procedure Name: Intubation Date/Time: 04/16/2023 11:47 PM  Performed by: Garfield Cornea, CRNAPre-anesthesia Checklist: Patient identified, Emergency Drugs available, Suction available and Patient being monitored Patient Re-evaluated:Patient Re-evaluated prior to induction Oxygen Delivery Method: Ambu bag Preoxygenation: Pre-oxygenation with 100% oxygen Laryngoscope Size: Glidescope and 3 Grade View: Grade I Tube type: Oral Tube size: 7.5 mm Number of attempts: 1 Airway Equipment and Method: Video-laryngoscopy and Rigid stylet Placement Confirmation: ETT inserted through vocal cords under direct vision, positive ETCO2 and breath sounds checked- equal and bilateral Secured at: 23 cm Tube secured with: Tape Dental Injury: Teeth and Oropharynx as per pre-operative assessment  Comments: Glidescope intubation during code event.

## 2023-04-16 NOTE — Plan of Care (Addendum)
MEWS red. Went to bedside to evaluate, tenuous status with complex medical stay requiring ICU level care for MSSA empyema. At bedside, diaphoretic, responsive appropriately, tremulous to some extent- same as previous evaluation two nights ago. Video of cheek twitching evaluated, no longer has this on my evaluation. Concern from nursing about displacement of feeding tube and poor cough reflex requiring NTS.   CXR - unchanged, EKG similar to previous, AM labs drawn   HOLD tube feeds overnight, Tylenol and symptomatic treatment for fever. Follow blood cx, otherwise do not expand abx from Cefazolin.   Tremulousness, stable. Reasonable to be concerned about sz activity although current not seizing. Will relay to day team, would need cEEG, neurology does not have any available at this time.   Hypertonic nebs for comfort.   Trial Metoprolol if HR consistently in 110s-120s.   Day team to relay to family members, hand off at 0700.   Alfredo Martinez, MD   FAMILY MEDICINE TEACHING SERVICE Patient - Please contact intern pager [phone]

## 2023-04-16 NOTE — Assessment & Plan Note (Addendum)
Unfortunately is becoming weaker and clinically declining, palliative care consulted previously and messaged today to see family to discuss GOC and code status more fully. - Palliative care consulted - PT/OT

## 2023-04-16 NOTE — Progress Notes (Signed)
   04/16/23 0114  Vitals  Temp 100.3 F (37.9 C)  Temp Source Oral  BP 126/80  MAP (mmHg) 94  BP Location Left Arm  BP Method Automatic  Patient Position (if appropriate) Lying  Pulse Rate (!) 104  Pulse Rate Source Monitor  ECG Heart Rate (!) 106  Resp (!) 35  Level of Consciousness  Level of Consciousness Alert  MEWS COLOR  MEWS Score Color Yellow  Oxygen Therapy  SpO2 94 %  O2 Device Nasal Cannula  O2 Flow Rate (L/min) 2 L/min  Pain Assessment  Pain Scale 0-10  MEWS Score  MEWS Temp 0  MEWS Systolic 0  MEWS Pulse 1  MEWS RR 2  MEWS LOC 0  MEWS Score 3

## 2023-04-16 NOTE — Plan of Care (Signed)
Contacted palliative medicine team, received response from Dr. Patterson Hammersmith.  She confirmed patient is on their consult list to be seen for GOC and discussion of code status.  May not be seen today but team will see as soon as they are available.  Elberta Fortis, MD 04/16/2023, 1:50 PM PGY-2, [facility] Family Medicine Service pager [phone]

## 2023-04-16 NOTE — Plan of Care (Addendum)
FMTS Interim Progress Note  S: Went to bedside with Dr. Jena Gauss after receiving a page about the patient being tachycardic, diaphoretic, and tachypneic with a temp of 100.3. Reportedly had a large NTS suction amount and concern for aspiration since he has a poor cough reflex.   O: BP 126/80 (BP Location: Left Arm)   Pulse (!) 116   Temp 100 F (37.8 C) (Rectal)   Resp (!) 26   Ht 6\' 1"  (1.854 m)   Wt 97.6 kg   SpO2 94%   BMI 28.39 kg/m   Cardiovascular: Irregularly irregular.  Respiratory: Rhonchi throughout. Diminished breath sounds. Abdomen: Soft, non-tender, non-distended.  Skin: Warm Neuro: Responds to commands and says his name. Tremors noted which are similar to previous exam.   A/P: Empyema  Malnutrition  Low Grade Fever Patient was responsive and exam was very similar to previous exam 2 nights prior after HD. He was mentating at baseline and responding to commands. Poor cough reflux. - Tylenol suppository given - D/c tube feeds - Ordered CXR and EKG - Labs: CBC, RFP, Mag, LA - Hypertonic neb BID PRN  Rest of plan per day team note.   Fortunato Curling, DO 04/16/2023, 2:04 AM PGY-1, [facility] Family Medicine Service pager [phone]

## 2023-04-16 NOTE — Progress Notes (Signed)
Daily Progress Note Intern Pager: [phone]  Patient name: Zachary Parrish Medical record number: [id number] Date of birth: 1940/10/08 Age: 83 y.o. Gender: male  Primary Care Provider: Verl Bangs, MD Consultants: Nephrology, Cardiology (signed off) , PCCM (signed off), Palliative care Code Status: Full   Pt Overview and Major Events to Date:  1/14 - Admitted to [facility] for acute hypoxic respiratory failure 2/2 R pulmonary effusion, CCM placed chest tube, cards consulted for uncontrolled afib 1/15 - Anuric (ARF 2/2 ischemic ATN in setting of septic shock), hypotensive, transferred to ICU for pressors. ID consulted and added Linezolid and Ceftriaxone for MSSA empyema. CRRT started. 1/16 - Linezolid/Ceftriaxone d/c'd, Cefazolin started. Cortrak placed. 1/21 - CRRT stopped, Lokelma started for hyperkalemia.  1/23 - sputum culture moderate candida albicans, thought to be colonized 1/24 - Remained anuric/hyperkalemic, HD initiated again. Came off pressors. 1/27 - Transferred back to [facility] 1/29 - Febrile overnight, code stroke called,  Vancomycin and Zosyn started  1/31 - Transitioned back to Cefazolin   Assessment and Plan:  82yo PMHx Afib s/p DCCV, carotid stenosis, CKD stage 3b, HLD, CAD, HTN, T2DM admitted for large right empyema and now hospital course complicated by septic shock (resolved), new dialysis requirement due to AKI on CKD IV. Assessment & Plan Empyema of right pleural space (HCC) S/p chest tube for MSSA empyema.  Per pulm no further intervention at this time and likely will need long-term recovery.  Discussed case with ID, recommend continuing cefazolin and further febrile episodes are likely secondary to poor source control. Abx course: -Ceftriaxone and Azithro (1/14) -CTX and Linezolid (1/15)  -Cefazolin (1/16-1/29) -Vancomycin and Zosyn (1/30) -Cefazolin (1/31- ) - Pulmonology following, appreciate recommendations - Wean O2 as able, goal >90%  ESRD (end stage  renal disease) on dialysis [facility]) Per nephrology unfortunately patient repeatedly not tolerating dialysis and may not be a candidate to continue therapy. If this is the case, will likely need to transition to comfort care. - Nephrology following, appreciate recs - Strict I/O and daily weights - Trend RFP, Mag Atrial fibrillation with RVR (HCC) Remains in A-fib, brief spike into RVR but overall controlled this AM. Will continue current care. - Cardiology signed off 1/29 - PO Metoprolol Tartrate 12.5mg  if HR >130 x4-5h if BP tolerates - Continue Amiodarone - Restarted Eliquis (stopped Heparin 1/31) Malnutrition of moderate degree Initiated 1/17. Made NPO 1/27 due to increased weakness with chewing foods, now on full liquid.  TF stopped overnight due to tremulous state but this has been ongoing and likely not seizure activity - Restart tube feeds -Advance to full liquids per SLP - RD and SLP recs - Zofran 4mg  q6h PRN nausea/vomiting Decreased mobility and endurance Unfortunately is becoming weaker and clinically declining, palliative care consulted previously and messaged today to see family to discuss GOC and code status more fully. - Palliative care consulted - PT/OT Constipation Large volume of stool noted on abdominal x-ray, manage with suppository and MiraLAX but may need enema if no output.  Concerning for GI vs GYN surgeon intervention if unable to clear stool load. Tremor Full body tremoring at rest, ongoing since admission and possibly secondary to electrolyte abnormalities from intolerance of dialysis.  Low concern for seizure activity giving ongoing nature. -Monitor RFP and adjust electrolytes as indicated Hypotension (Resolved: 04/16/2023) Previously on Coreg 25 mg BID and Telmisartan 20 mg daily for HTN at home however has been hypotensive throughout admission, requiring ICU for Levophed drip. BP now stable and has been off  pressors. - Continue Midodrine 15mg  TID - Ted  hose   Chronic and Stable Problems:  Chronic back pain - Oxy 2.5mg  PRN, tylenol 650mg  PRN Alcohol use - Continue folate, MVI, thiamine. Did require phenobarbital taper during admission, now outside withdrawal window.  Gout: Possible flare, treated with prednisone 20mg  x3 days (ended 1/27) Thyroid lesion - CT chest w/o contrast with 2.4cm cystic lesion in R thyroid lobe, TSH 3.279 on admission HLD - continue lipitor 40mg  Tobacco use - continue 21mg  nicotine patch T2DM - SSI + at bedtime coverage Anemia of chronic disease - Hgb stable, transfuse <7   FEN/GI: Tube feed via NGT PPx: Eliquis 2.5 mg BID Dispo: Pending clinical improvement  Subjective:  Assessed at bedside with wife and son present.  Patient open his eyes with minimal engagement in conversation.  Notable full body tremoring that remained consistent throughout the exam.  Family expressed that patient usually waxes and wanes throughout the day with alertness usually being the best in the morning.  Has not stooled since 1/27.  Objective: Temp:  [97.7 F (36.5 C)-100.3 F (37.9 C)] 98.5 F (36.9 C) (02/01 0404) Pulse Rate:  [80-148] 101 (02/01 0404) Resp:  [18-35] 20 (02/01 0404) BP: (121-144)/(54-82) 131/56 (02/01 0404) SpO2:  [93 %-100 %] 97 % (02/01 0404) Weight:  [97.6 kg-99.5 kg] 99.5 kg (02/01 0412) Physical Exam: General: Critically ill appearing male, eyes open to voice and will have limited verbal response.  NAD.  NG and [place] in place Cardiovascular: Irregularly irregular rhythm.  Rate controlled.  No murmur. Respiratory: Diminished breath sounds throughout. Abdomen: Soft, nontender, nondistended Extremities: No peripheral edema noted.  Laboratory: Most recent CBC Lab Results  Component Value Date   WBC 13.4 (H) 04/16/2023   HGB 7.4 (L) 04/16/2023   HCT 22.8 (L) 04/16/2023   MCV 101.3 (H) 04/16/2023   PLT 201 04/16/2023   Most recent BMP    Latest Ref Rng & Units 04/16/2023    1:45 AM  BMP  Glucose 70 -  99 mg/dL 161   BUN 8 - 23 mg/dL 69   Creatinine 0.96 - 1.24 mg/dL 0.45   Sodium 409 - 811 mmol/L 132   Potassium 3.5 - 5.1 mmol/L 4.1   Chloride 98 - 111 mmol/L 91   CO2 22 - 32 mmol/L 28   Calcium 8.9 - 10.3 mg/dL 8.3     Other pertinent labs: Lactic acid: 0.5  Imaging/Diagnostic Tests: DG CHEST PORT 1 VIEW Result Date: 04/16/2023 IMPRESSION: Cardiac enlargement. Bilateral pleural effusions with basilar atelectasis, greater on the right. No change.  DG Swallowing Func-Speech Pathology Result Date: 04/15/2023 Impression: Pt presents with a primary esophageal dysphagia. Results appear consistent with MBS from 1/23 - esophageal sweeps revealed significant retention of barium within the esophagus with retrograde flow visualized through the PES and filling the pyriform sinuses. The oral phase of the swallow was disorganized with oral delays, barium escape within oral cavity, delayed initiation of tongue movement - all of which are consistent with clinical presentation this last week and related to mental status.  Laryngeal vestibule closure and pharyngeal contraction were relatively normal. Pt had occasional penetration of thin liquids into the vestibule, but no gross aspiration. The primary issue is related to esophageal dysfunction with potential for post-prandial aspiration.  Mrs. Arismendez was present for the study. We talked about the results, the concerns related to mental status, being able to take in enough nutrition to meet his needs, and the ways in which his esophageal dysfunction  is contributing to the problem.  Per the notes, there is a palliative meeting pending.  She voices feeling discouraged about the future -encouraged her to speak with Palliative Care about her concerns and the options. In the interim, recommend allowing full liquid diet with small sips/bites for pleasure; keep upright for all intake and 30 min after.  SLP will follow. Factors that may increase risk of adverse event in  presence of aspiration [name] & Clearance Coots 2021): Factors that may increase risk of adverse event in presence of aspiration [name] & Clearance Coots 2021): Limited mobility; Frail or deconditioned; Dependence for feeding and/or oral hygiene; Weak cough; Presence of tubes (ETT, trach, NG, etc.); Respiratory or GI disease Recommendations/Plan: Swallowing Evaluation Recommendations Swallowing Evaluation Recommendations Recommendations: PO diet PO Diet Recommendation: Full liquid diet Liquid Administration via: Cup; Straw Medication Administration: Via alternative means Supervision: Full assist for feeding Swallowing strategies  : Slow rate; Small bites/sips; Follow solids with liquids Postural changes: Position pt fully upright for meals; Stay upright 30-60 min after meals Oral care recommendations: Oral care BID (2x/day) Treatment Plan Treatment Plan Treatment recommendations: Therapy as outlined in treatment plan below     Elberta Fortis, MD 04/16/2023, 8:41 AM  PGY-2, [facility] FPTS Intern pager: [phone], text pages welcome Secure chat group [facility] Teaching Service

## 2023-04-16 NOTE — Assessment & Plan Note (Addendum)
Per nephrology unfortunately patient repeatedly not tolerating dialysis and may not be a candidate to continue therapy. If this is the case, will likely need to transition to comfort care. - Nephrology following, appreciate recs - Strict I/O and daily weights - Trend RFP, Mag

## 2023-04-16 NOTE — Assessment & Plan Note (Addendum)
Remains in A-fib, brief spike into RVR but overall controlled this AM. Will continue current care. - Cardiology signed off 1/29 - PO Metoprolol Tartrate 12.5mg  if HR >130 x4-5h if BP tolerates - Continue Amiodarone - Restarted Eliquis (stopped Heparin 1/31)

## 2023-04-16 NOTE — Assessment & Plan Note (Addendum)
S/p chest tube for MSSA empyema.  Per pulm no further intervention at this time and likely will need long-term recovery.  Discussed case with ID, recommend continuing cefazolin and further febrile episodes are likely secondary to poor source control. Abx course: -Ceftriaxone and Azithro (1/14) -CTX and Linezolid (1/15)  -Cefazolin (1/16-1/29) -Vancomycin and Zosyn (1/30) -Cefazolin (1/31- ) - Pulmonology following, appreciate recommendations - Wean O2 as able, goal >90%

## 2023-04-16 NOTE — Progress Notes (Addendum)
Patient Heart Rate At. FIb. 120's . Patient is Diaphoric and warm.Resp 34.  Patient states he is in no pain. Maury Dus into assess patient. CBG 167. Se vital sign flowsheet. Resp . There. And Mindy Rn with [facility] called. Dr Henrene Hawking and on her way to see patient. See new orders. Resp. Therapy NTS suction large amt. Of tan color mucus Looks like T. F.

## 2023-04-16 DEATH — deceased

## 2023-04-17 ENCOUNTER — Inpatient Hospital Stay (HOSPITAL_COMMUNITY): Payer: Medicare Other

## 2023-04-17 DIAGNOSIS — Z515 Encounter for palliative care: Secondary | ICD-10-CM

## 2023-04-17 DIAGNOSIS — I469 Cardiac arrest, cause unspecified: Secondary | ICD-10-CM | POA: Diagnosis not present

## 2023-04-17 DIAGNOSIS — J9601 Acute respiratory failure with hypoxia: Secondary | ICD-10-CM | POA: Diagnosis not present

## 2023-04-17 DIAGNOSIS — N186 End stage renal disease: Secondary | ICD-10-CM | POA: Diagnosis not present

## 2023-04-17 DIAGNOSIS — Z66 Do not resuscitate: Secondary | ICD-10-CM

## 2023-04-17 DIAGNOSIS — R569 Unspecified convulsions: Secondary | ICD-10-CM

## 2023-04-17 DIAGNOSIS — J869 Pyothorax without fistula: Secondary | ICD-10-CM | POA: Diagnosis not present

## 2023-04-17 DIAGNOSIS — Z7189 Other specified counseling: Secondary | ICD-10-CM | POA: Diagnosis not present

## 2023-04-17 LAB — POCT I-STAT 7, (LYTES, BLD GAS, ICA,H+H)
Acid-Base Excess: 1 mmol/L (ref 0.0–2.0)
Acid-base deficit: 2 mmol/L (ref 0.0–2.0)
Bicarbonate: 26.6 mmol/L (ref 20.0–28.0)
Bicarbonate: 25.3 mmol/L (ref 20.0–28.0)
Calcium, Ion: 1.2 mmol/L (ref 1.15–1.40)
Calcium, Ion: 1.12 mmol/L — ABNORMAL LOW (ref 1.15–1.40)
HCT: 23 % — ABNORMAL LOW (ref 39.0–52.0)
HCT: 23 % — ABNORMAL LOW (ref 39.0–52.0)
Hemoglobin: 7.8 g/dL — ABNORMAL LOW (ref 13.0–17.0)
Hemoglobin: 7.8 g/dL — ABNORMAL LOW (ref 13.0–17.0)
O2 Saturation: 90 %
O2 Saturation: 100 %
Patient temperature: 38
Patient temperature: 98
Potassium: 4.6 mmol/L (ref 3.5–5.1)
Potassium: 4.6 mmol/L (ref 3.5–5.1)
Sodium: 128 mmol/L — ABNORMAL LOW (ref 135–145)
Sodium: 129 mmol/L — ABNORMAL LOW (ref 135–145)
TCO2: 29 mmol/L (ref 22–32)
TCO2: 26 mmol/L (ref 22–32)
pCO2 arterial: 68.7 mm[Hg] (ref 32–48)
pCO2 arterial: 37.2 mm[Hg] (ref 32–48)
pH, Arterial: 7.201 — ABNORMAL LOW (ref 7.35–7.45)
pH, Arterial: 7.44 (ref 7.35–7.45)
pO2, Arterial: 77 mm[Hg] — ABNORMAL LOW (ref 83–108)
pO2, Arterial: 484 mm[Hg] — ABNORMAL HIGH (ref 83–108)

## 2023-04-17 LAB — GLUCOSE, CAPILLARY
Glucose-Capillary: 128 mg/dL — ABNORMAL HIGH (ref 70–99)
Glucose-Capillary: 167 mg/dL — ABNORMAL HIGH (ref 70–99)
Glucose-Capillary: 177 mg/dL — ABNORMAL HIGH (ref 70–99)

## 2023-04-17 LAB — CBC
HCT: 23 % — ABNORMAL LOW (ref 39.0–52.0)
HCT: 21.9 % — ABNORMAL LOW (ref 39.0–52.0)
Hemoglobin: 7 g/dL — ABNORMAL LOW (ref 13.0–17.0)
Hemoglobin: 7 g/dL — ABNORMAL LOW (ref 13.0–17.0)
MCH: 31.8 pg (ref 26.0–34.0)
MCH: 32.3 pg (ref 26.0–34.0)
MCHC: 30.4 g/dL (ref 30.0–36.0)
MCHC: 32 g/dL (ref 30.0–36.0)
MCV: 104.5 fL — ABNORMAL HIGH (ref 80.0–100.0)
MCV: 100.9 fL — ABNORMAL HIGH (ref 80.0–100.0)
Platelets: 215 10*3/uL (ref 150–400)
Platelets: 189 10*3/uL (ref 150–400)
RBC: 2.2 MIL/uL — ABNORMAL LOW (ref 4.22–5.81)
RBC: 2.17 MIL/uL — ABNORMAL LOW (ref 4.22–5.81)
RDW: 18.6 % — ABNORMAL HIGH (ref 11.5–15.5)
RDW: 18.4 % — ABNORMAL HIGH (ref 11.5–15.5)
WBC: 14.6 10*3/uL — ABNORMAL HIGH (ref 4.0–10.5)
WBC: 13.5 10*3/uL — ABNORMAL HIGH (ref 4.0–10.5)
nRBC: 0.3 % — ABNORMAL HIGH (ref 0.0–0.2)
nRBC: 0.4 % — ABNORMAL HIGH (ref 0.0–0.2)

## 2023-04-17 LAB — BASIC METABOLIC PANEL
Anion gap: 18 — ABNORMAL HIGH (ref 5–15)
BUN: 95 mg/dL — ABNORMAL HIGH (ref 8–23)
CO2: 24 mmol/L (ref 22–32)
Calcium: 8.6 mg/dL — ABNORMAL LOW (ref 8.9–10.3)
Chloride: 90 mmol/L — ABNORMAL LOW (ref 98–111)
Creatinine, Ser: 5 mg/dL — ABNORMAL HIGH (ref 0.61–1.24)
GFR, Estimated: 11 mL/min — ABNORMAL LOW (ref >60)
Glucose, Bld: 210 mg/dL — ABNORMAL HIGH (ref 70–99)
Potassium: 4.9 mmol/L (ref 3.5–5.1)
Sodium: 132 mmol/L — ABNORMAL LOW (ref 135–145)

## 2023-04-17 LAB — RENAL FUNCTION PANEL
Albumin: 2.3 g/dL — ABNORMAL LOW (ref 3.5–5.0)
Anion gap: 18 — ABNORMAL HIGH (ref 5–15)
BUN: 96 mg/dL — ABNORMAL HIGH (ref 8–23)
CO2: 22 mmol/L (ref 22–32)
Calcium: 8.6 mg/dL — ABNORMAL LOW (ref 8.9–10.3)
Chloride: 89 mmol/L — ABNORMAL LOW (ref 98–111)
Creatinine, Ser: 5.13 mg/dL — ABNORMAL HIGH (ref 0.61–1.24)
GFR, Estimated: 11 mL/min — ABNORMAL LOW (ref >60)
Glucose, Bld: 177 mg/dL — ABNORMAL HIGH (ref 70–99)
Phosphorus: 5.6 mg/dL — ABNORMAL HIGH (ref 2.5–4.6)
Potassium: 4.5 mmol/L (ref 3.5–5.1)
Sodium: 129 mmol/L — ABNORMAL LOW (ref 135–145)

## 2023-04-17 LAB — MAGNESIUM: Magnesium: 2.3 mg/dL (ref 1.7–2.4)

## 2023-04-17 LAB — TROPONIN I (HIGH SENSITIVITY)
Troponin I (High Sensitivity): 66 ng/L — ABNORMAL HIGH (ref <18)
Troponin I (High Sensitivity): 89 ng/L — ABNORMAL HIGH (ref <18)

## 2023-04-17 MED ORDER — ACETAMINOPHEN 325 MG PO TABS: 650.0000 mg | ORAL_TABLET | Freq: Four times a day (QID) | ORAL | Status: DC | PRN

## 2023-04-17 MED ORDER — FENTANYL 2500MCG IN NS 250ML (10MCG/ML) PREMIX INFUSION
0.0000 ug/h | INTRAVENOUS | Status: DC
  Administered 2023-04-17: 50 ug/h via INTRAVENOUS

## 2023-04-17 MED ORDER — PROPOFOL 1000 MG/100ML IV EMUL
0.0000 ug/kg/min | INTRAVENOUS | Status: DC
  Administered 2023-04-17: 50 ug/kg/min via INTRAVENOUS
  Administered 2023-04-17: 40 ug/kg/min via INTRAVENOUS
  Administered 2023-04-17: 5 ug/kg/min via INTRAVENOUS
  Filled 2023-04-17 (×4): qty 100

## 2023-04-17 MED ORDER — DOCUSATE SODIUM 50 MG/5ML PO LIQD
100.0000 mg | Freq: Two times a day (BID) | ORAL | Status: DC
Start: 2023-04-17 — End: 2023-04-17
  Administered 2023-04-17 (×2): 100 mg
  Filled 2023-04-17 (×2): qty 10

## 2023-04-17 MED ORDER — ACETAMINOPHEN 160 MG/5ML PO SOLN: 650.0000 mg | ORAL | Status: DC | PRN

## 2023-04-17 MED ORDER — FENTANYL CITRATE PF 50 MCG/ML IJ SOSY: 25.0000 ug | PREFILLED_SYRINGE | Freq: Once | INTRAMUSCULAR | Status: AC

## 2023-04-17 MED ORDER — BUSPIRONE HCL 10 MG PO TABS: 30.0000 mg | ORAL_TABLET | Freq: Three times a day (TID) | ORAL | Status: DC | PRN

## 2023-04-17 MED ORDER — ORAL CARE MOUTH RINSE: 15.0000 mL | OROMUCOSAL | Status: DC | PRN

## 2023-04-17 MED ORDER — GLYCOPYRROLATE 0.2 MG/ML IJ SOLN: 0.2000 mg | INTRAMUSCULAR | Status: DC | PRN

## 2023-04-17 MED ORDER — POLYETHYLENE GLYCOL 3350 17 G PO PACK
17.0000 g | PACK | Freq: Every day | ORAL | Status: DC
Start: 2023-04-17 — End: 2023-04-17

## 2023-04-17 MED ORDER — INSULIN ASPART 100 UNIT/ML IJ SOLN
0.0000 [IU] | INTRAMUSCULAR | Status: DC
  Administered 2023-04-17 (×2): 1 [IU] via SUBCUTANEOUS

## 2023-04-17 MED ORDER — ACETAMINOPHEN 160 MG/5ML PO SOLN
650.0000 mg | ORAL | Status: DC
  Administered 2023-04-17 (×3): 650 mg
  Filled 2023-04-17 (×3): qty 20.3

## 2023-04-17 MED ORDER — ACETAMINOPHEN 325 MG PO TABS: 650.0000 mg | ORAL_TABLET | ORAL | Status: DC

## 2023-04-17 MED ORDER — GLYCOPYRROLATE 1 MG PO TABS: 1.0000 mg | ORAL_TABLET | ORAL | Status: DC | PRN

## 2023-04-17 MED ORDER — ONDANSETRON HCL 4 MG/2ML IJ SOLN: 4.0000 mg | Freq: Four times a day (QID) | INTRAMUSCULAR | Status: DC | PRN

## 2023-04-17 MED ORDER — NOREPINEPHRINE 4 MG/250ML-% IV SOLN
0.0000 ug/min | INTRAVENOUS | Status: DC
  Administered 2023-04-17: 2 ug/min via INTRAVENOUS
  Filled 2023-04-17: qty 250

## 2023-04-17 MED ORDER — FENTANYL CITRATE PF 50 MCG/ML IJ SOSY
25.0000 ug | PREFILLED_SYRINGE | INTRAMUSCULAR | Status: DC | PRN
  Administered 2023-04-17: 25 ug via INTRAVENOUS

## 2023-04-17 MED ORDER — FENTANYL CITRATE PF 50 MCG/ML IJ SOSY
25.0000 ug | PREFILLED_SYRINGE | INTRAMUSCULAR | Status: DC | PRN
  Administered 2023-04-17: 50 ug via INTRAVENOUS
  Filled 2023-04-17 (×2): qty 1

## 2023-04-17 MED ORDER — ACETAMINOPHEN 650 MG RE SUPP: 650.0000 mg | Freq: Four times a day (QID) | RECTAL | Status: DC | PRN

## 2023-04-17 MED ORDER — PANTOPRAZOLE SODIUM 40 MG IV SOLR
40.0000 mg | Freq: Every day | INTRAVENOUS | Status: DC
Start: 2023-04-17 — End: 2023-04-17

## 2023-04-17 MED ORDER — MAGNESIUM SULFATE 2 GM/50ML IV SOLN: 2.0000 g | Freq: Once | INTRAVENOUS | Status: DC | PRN

## 2023-04-17 MED ORDER — ACETAMINOPHEN 650 MG RE SUPP: 650.0000 mg | RECTAL | Status: DC | PRN

## 2023-04-17 MED ORDER — ORAL CARE MOUTH RINSE
15.0000 mL | OROMUCOSAL | Status: DC
  Administered 2023-04-17 (×6): 15 mL via OROMUCOSAL

## 2023-04-17 MED ORDER — MIDAZOLAM HCL 2 MG/2ML IJ SOLN
2.0000 mg | INTRAMUSCULAR | Status: DC | PRN
  Administered 2023-04-17 (×2): 2 mg via INTRAVENOUS
  Filled 2023-04-17: qty 4

## 2023-04-17 MED ORDER — POLYVINYL ALCOHOL 1.4 % OP SOLN: 1.0000 [drp] | Freq: Four times a day (QID) | OPHTHALMIC | Status: DC | PRN

## 2023-04-17 MED ORDER — ACETAMINOPHEN 650 MG RE SUPP: 650.0000 mg | RECTAL | Status: DC

## 2023-04-17 MED ORDER — FAMOTIDINE 20 MG PO TABS
20.0000 mg | ORAL_TABLET | Freq: Two times a day (BID) | ORAL | Status: DC
Start: 2023-04-17 — End: 2023-04-17
  Administered 2023-04-17 (×2): 20 mg
  Filled 2023-04-17 (×2): qty 1

## 2023-04-17 MED ORDER — SODIUM CHLORIDE 0.9 % IV SOLN: 250.0000 mL | INTRAVENOUS | Status: DC

## 2023-04-17 MED ORDER — NOREPINEPHRINE 4 MG/250ML-% IV SOLN
0.0500 ug/min | INTRAVENOUS | Status: DC
  Administered 2023-04-17: 0.05 ug/min via INTRAVENOUS

## 2023-04-17 MED ORDER — FENTANYL 2500MCG IN NS 250ML (10MCG/ML) PREMIX INFUSION
25.0000 ug/h | INTRAVENOUS | Status: DC
  Administered 2023-04-17: 25 ug/h via INTRAVENOUS
  Filled 2023-04-17: qty 250

## 2023-04-17 MED ORDER — FENTANYL BOLUS VIA INFUSION
100.0000 ug | INTRAVENOUS | Status: DC | PRN
  Administered 2023-04-17: 100 ug via INTRAVENOUS

## 2023-04-17 MED ORDER — ACETAMINOPHEN 325 MG PO TABS: 650.0000 mg | ORAL_TABLET | ORAL | Status: DC | PRN

## 2023-04-17 MED ORDER — GLYCOPYRROLATE 0.2 MG/ML IJ SOLN
0.2000 mg | INTRAMUSCULAR | Status: DC | PRN
  Administered 2023-04-17: 0.2 mg via INTRAVENOUS
  Filled 2023-04-17: qty 1

## 2023-04-17 MED ORDER — PROPOFOL 1000 MG/100ML IV EMUL
5.0000 ug/kg/min | INTRAVENOUS | Status: DC
  Administered 2023-04-17: 30 ug/kg/min via INTRAVENOUS

## 2023-04-17 MED ORDER — FENTANYL BOLUS VIA INFUSION: 25.0000 ug | INTRAVENOUS | Status: DC | PRN

## 2023-04-18 LAB — CULTURE, BLOOD (ROUTINE X 2)
Culture: NO GROWTH
Culture: NO GROWTH
Special Requests: ADEQUATE
Special Requests: ADEQUATE

## 2023-05-03 ENCOUNTER — Ambulatory Visit: Payer: Medicare Other | Admitting: Adult Health

## 2023-05-14 NOTE — Progress Notes (Signed)
While transporting pt from 4E15 to 2H04 POST cardiac arrest, pt had generalized clonic-tonic seizure lasting approx 1 minute. Seizure activity subsided with no interventions. Leotis Shames, PCCM PA and Dr. Wynona Neat PCCM MD notified on arrival to [facility].

## 2023-05-14 NOTE — Progress Notes (Signed)
Patient ID: Zachary Parrish, male   DOB: April 30, 1940, 83 y.o.   MRN: [id number]   S: events noted. Yesterday patient looked very poor-  was not tolerating dialysis and I was lying the groundwork to say we needed to go to comfort-  he was on the list for palliative to see but then suffered resp and PEA arrest now in ICU on vent and pressors-  still no UOP    O:BP 136/67   Pulse 82   Temp 97.8 F (36.6 C)   Resp (!) 0   Ht 6\' 1"  (1.854 m)   Wt 103.7 kg   SpO2 100%   BMI 30.16 kg/m   Intake/Output Summary (Last 24 hours) at 04/29/2023 0748 Last data filed at 04/29/2023 0700 Gross per 24 hour  Intake 199.76 ml  Output --  Net 199.76 ml   Intake/Output: I/O last 3 completed shifts: In: 674.8 [I.V.:199.8; NG/GT:475] Out: 0   Intake/Output this shift:  No intake/output data recorded. Weight change: 6.1 kg Gen: sedated on vent  left internal jugular cath in for 8 days CVS: RRR Resp:  rhonchi Abd: +BS, soft, NT/ND Ext: 2+ edema  Recent Labs  Lab 04/11/23 0410 04/12/23 0506 04/13/23 0146 04/14/23 0630 04/15/23 0325 04/16/23 0145 04/21/2023 0118 04/25/2023 0142 05/06/2023 0421 05/10/2023 0432  NA 125* 126* 125* 130* 126* 132* 132* 128* 129* 129*  K 4.1 4.6 4.9 4.0 4.1 4.1 4.9 4.6 4.6 4.5  CL 89* 87* 85* 90* 87* 91* 90*  --   --  89*  CO2 27 26 24 26 24 28 24   --   --  22  GLUCOSE 172* 224* 157* 217* 152* 181* 210*  --   --  177*  BUN 68* 118* 141* 94* 110* 69* 95*  --   --  96*  CREATININE 3.58* 5.27* 6.23* 4.41* 5.20* 3.72* 5.00*  --   --  5.13*  ALBUMIN 1.8* 1.8* 1.9* 1.9* 2.9* 2.7*  --   --   --  2.3*  CALCIUM 7.4* 7.7* 7.9* 7.8* 8.1* 8.3* 8.6*  --   --  8.6*  PHOS 4.7* 4.5 6.2* 3.2 4.7* 3.7  --   --   --  5.6*   Liver Function Tests: Recent Labs  Lab 04/15/23 0325 04/16/23 0145 04/23/2023 0432  ALBUMIN 2.9* 2.7* 2.3*   No results for input(s): "LIPASE", "AMYLASE" in the last 168 hours. No results for input(s): "AMMONIA" in the last 168 hours. CBC: Recent Labs  Lab  04/14/23 0630 04/15/23 0325 04/16/23 0145 05/02/2023 0118 04/20/2023 0142 04/27/2023 0421 04/19/2023 0432  WBC 11.7* 13.7* 13.4* 14.6*  --   --  13.5*  HGB 7.0* 7.2* 7.4* 7.0* 7.8* 7.8* 7.0*  HCT 21.2* 22.0* 22.8* 23.0* 23.0* 23.0* 21.9*  MCV 97.7 98.7 101.3* 104.5*  --   --  100.9*  PLT 181 186 201 215  --   --  189   Cardiac Enzymes: No results for input(s): "CKTOTAL", "CKMB", "CKMBINDEX", "TROPONINI" in the last 168 hours. CBG: Recent Labs  Lab 04/16/23 1203 04/16/23 1702 04/16/23 2154 04/19/2023 0014 04/18/2023 0317  GLUCAP 138* 154* 164* 177* 167*    Iron Studies: No results for input(s): "IRON", "TIBC", "TRANSFERRIN", "FERRITIN" in the last 72 hours. Studies/Results: Portable Chest x-ray Result Date: 05/05/2023 CLINICAL DATA:  Endotracheal tube placement EXAM: PORTABLE CHEST 1 VIEW COMPARISON:  04/16/2023 FINDINGS: The endotracheal tube is seen 18 mm above the carina. Nasoenteric feeding tube appears coiled within the gastric fundus. Left internal jugular  hemodialysis catheter tip noted within the expected superior vena cava Lung volumes are small. Moderate to large right pleural effusion is present demonstrating apical loculated component with compressive atelectasis of the right lung. Small left pleural effusion is also present compressive atelectasis of the left lung base peer pleural effusions appear enlarged since prior examination. Cardiac size is at the upper limits of normal given poor pulmonary insufflation and position. Pulmonary vascularity is. IMPRESSION: 1. Support tubes and lines in appropriate position. 2. Enlarging moderate to large right pleural effusion with apical loculated component and compressive atelectasis of the right lung. 3. Enlarging, small left pleural effusion with compressive atelectasis of the left lung base. Electronically Signed   By: Helyn Numbers M.D.   On: 04/21/2023 01:30   DG Abd 1 View Result Date: 04/16/2023 CLINICAL DATA:  Nasogastric tube placement  EXAM: ABDOMEN - 1 VIEW COMPARISON:  Contemporaneous chest radiograph FINDINGS: The enteric tube tip is likely at the stomach, tip either not covered or obscured by enteric contrast. Contrast throughout the colon without over distension. No concerning intra-abdominal mass effect or calcification. IMPRESSION: Minimally covered enteric tube, tip likely at the stomach and obscured. Electronically Signed   By: Tiburcio Pea M.D.   On: 04/16/2023 12:07   DG CHEST PORT 1 VIEW Result Date: 04/16/2023 CLINICAL DATA:  Tachycardia.  Possible aspiration. EXAM: PORTABLE CHEST 1 VIEW COMPARISON:  04/13/2023 FINDINGS: Left central venous catheter with tip over the low SVC region. No pneumothorax. Enteric tube is present with tip off the field of view but below the left hemidiaphragm. Shallow inspiration. Cardiac enlargement. Bilateral pleural effusions, greater on the right. Atelectasis or infiltration in the lung bases. Calcification of the aorta. Similar appearance to previous study. IMPRESSION: Cardiac enlargement. Bilateral pleural effusions with basilar atelectasis, greater on the right. No change. Electronically Signed   By: Burman Nieves M.D.   On: 04/16/2023 01:51   DG Swallowing Func-Speech Pathology Result Date: 04/15/2023 Table formatting from the original result was not included. Modified Barium Swallow Study Patient Details Name: Zachary Parrish MRN: [id number] Date of Birth: [birth year] Today's Date: 04/15/2023 HPI/PMH: HPI: 83 y.o. male presented to ED 1/14 with SOB and found to have AKI due to ischemic ATN r/t septic shock. PMH: HTN, T2DM, CAD, A-fib. Dx large R pleural effusion and small L pleural effusion. Chest tube in place and culture grew staph aureus. CRRT 1/16; transitioned to HD M/W/F.  MBS 1/23 aspiration thin liquids; chin tuck/small sips were preventative. Esophagram 1/24: mild-mod esophageal dysmotility; mild tapered narrowing of distal esophagus, recs for endoscopy per imaging note. Diet downgraded  to dysphagia 1/nectar-thick liquids 1/27 due to inability to f/c for chin tuck, poorer toleration of diet.  Pt subsequently made NPO due to lethargy.  Cortrak in place. Clinical Impression: Clinical Impression: Pt presents with a primary esophageal dysphagia. Results appear consistent with MBS from 1/23 - esophageal sweeps revealed significant retention of barium within the esophagus with retrograde flow visualized through the PES and filling the pyriform sinuses. The oral phase of the swallow was disorganized with oral delays, barium escape within oral cavity, delayed initiation of tongue movement - all of which are consistent with clinical presentation this last week and related to mental status.  Laryngeal vestibule closure and pharyngeal contraction were relatively normal. Pt had occasional penetration of thin liquids into the vestibule, but no gross aspiration. The primary issue is related to esophageal dysfunction with potential for post-prandial aspiration.  Mrs. Nicholson was present for the study. We talked  about the results, the concerns related to mental status, being able to take in enough nutrition to meet his needs, and the ways in which his esophageal dysfunction is contributing to the problem.  Per the notes, there is a palliative meeting pending.  She voices feeling discouraged about the future -encouraged her to speak with Palliative Care about her concerns and the options. In the interim, recommend allowing full liquid diet with small sips/bites for pleasure; keep upright for all intake and 30 min after.  SLP will follow. Factors that may increase risk of adverse event in presence of aspiration [name] [employer] Clearance Coots 2021): Factors that may increase risk of adverse event in presence of aspiration [name] & Clearance Coots 2021): Limited mobility; Frail or deconditioned; Dependence for feeding and/or oral hygiene; Weak cough; Presence of tubes (ETT, trach, NG, etc.); Respiratory or GI disease Recommendations/Plan:  Swallowing Evaluation Recommendations Swallowing Evaluation Recommendations Recommendations: PO diet PO Diet Recommendation: Full liquid diet Liquid Administration via: Cup; Straw Medication Administration: Via alternative means Supervision: Full assist for feeding Swallowing strategies  : Slow rate; Small bites/sips; Follow solids with liquids Postural changes: Position pt fully upright for meals; Stay upright 30-60 min after meals Oral care recommendations: Oral care BID (2x/day) Treatment Plan Treatment Plan Treatment recommendations: Therapy as outlined in treatment plan below Follow-up recommendations: Skilled nursing-short term rehab (<3 hours/day) Functional status assessment: Patient has had a recent decline in their functional status and demonstrates the ability to make significant improvements in function in a reasonable and predictable amount of time. Treatment frequency: Min 2x/week Treatment duration: 2 weeks Interventions: Diet toleration management by SLP; Patient/family education Recommendations Recommendations for follow up therapy are one component of a multi-disciplinary discharge planning process, led by the attending physician.  Recommendations may be updated based on patient status, additional functional criteria and insurance authorization. Assessment: Orofacial Exam: Orofacial Exam Oral Cavity: Oral Hygiene: WFL Oral Cavity - Dentition: Dentures, top; Dentures, bottom Orofacial Anatomy: WFL Oral Motor/Sensory Function: WFL Anatomy: Anatomy: WFL Boluses Administered: Boluses Administered Boluses Administered: Thin liquids (Level 0); Mildly thick liquids (Level 2, nectar thick); Puree  Oral Impairment Domain: Oral Impairment Domain Lip Closure: Escape progressing to mid-chin Tongue control during bolus hold: Escape to lateral buccal cavity/floor of mouth Bolus preparation/mastication: Slow prolonged chewing/mashing with complete recollection Bolus transport/lingual motion: Delayed initiation of  tongue motion (oral holding) Oral residue: Residue collection on oral structures Location of oral residue : Tongue; Lateral sulci Initiation of pharyngeal swallow : Posterior angle of the ramus  Pharyngeal Impairment Domain: Pharyngeal Impairment Domain Soft palate elevation: No bolus between soft palate (SP)/pharyngeal wall (PW) Laryngeal elevation: Complete superior movement of thyroid cartilage with complete approximation of arytenoids to epiglottic petiole Anterior hyoid excursion: Partial anterior movement Epiglottic movement: Complete inversion Laryngeal vestibule closure: Incomplete, narrow column air/contrast in laryngeal vestibule Pharyngeal stripping wave : Present - complete Pharyngeal contraction (A/P view only): N/A Pharyngoesophageal segment opening: Complete distension and complete duration, no obstruction of flow Tongue base retraction: Trace column of contrast or air between tongue base and PPW Pharyngeal residue: Complete pharyngeal clearance  Esophageal Impairment Domain: Esophageal Impairment Domain Esophageal clearance upright position: Esophageal retention with retrograde flow through the PES Pill: Pill Consistency administered: -- (not tested) Penetration/Aspiration Scale Score: Penetration/Aspiration Scale Score 1.  Material does not enter airway: Mildly thick liquids (Level 2, nectar thick); Puree 3.  Material enters airway, remains ABOVE vocal cords and not ejected out: Thin liquids (Level 0) Compensatory Strategies: Compensatory Strategies Compensatory strategies: Yes Straw: Effective  General Information: Caregiver present: Yes  Diet Prior to this Study: NPO   Temperature : Normal   No data recorded  Supplemental O2: Nasal cannula   History of Recent Intubation: No  Behavior/Cognition: Alert; Cooperative; Pleasant mood Self-Feeding Abilities: Able to self-feed Baseline vocal quality/speech: Normal Volitional Cough: Able to elicit Volitional Swallow: Able to elicit Exam Limitations:  Limited visibility Goal Planning: No data recorded No data recorded No data recorded No data recorded Consulted and agree with results and recommendations: Family member/caregiver; Nurse; Physician Pain: Pain Assessment Pain Assessment: No/denies pain Faces Pain Scale: 4 Pain Location: lower back; R knee with ROM Pain Descriptors / Indicators: Grimacing; Sore Pain Intervention(s): Limited activity within patient's tolerance; Monitored during session; Repositioned End of Session: Start Time:SLP Start Time (ACUTE ONLY): 1205 Stop Time: SLP Stop Time (ACUTE ONLY): 1244 Time Calculation:SLP Time Calculation (min) (ACUTE ONLY): 39 min Charges: SLP Evaluations $ SLP Speech Visit: 1 Visit SLP Evaluations $MBS Swallow: 1 Procedure $Swallowing Treatment: 1 Procedure SLP visit diagnosis: SLP Visit Diagnosis: Dysphagia, pharyngoesophageal phase (R13.14) Past Medical History: Past Medical History: Diagnosis Date  Acute renal failure (ARF) (HCC) 05/31/2019  B12 deficiency 08/02/2019  Bilateral carotid artery stenosis 05/31/2021  CAD (coronary artery disease) 2014  60-70% calcified LM lesion, high grade OM lesion, treated medically.  Chronic kidney disease, stage IV (severe) (HCC) 07/19/2019  Coronary artery disease involving native coronary artery of native heart without angina pectoris 07/19/2019  DM (diabetes mellitus) (HCC)   no longer on medications  Dyslipidemia 11/22/2019  Essential hypertension 07/19/2019  Gout   Gouty arthritis 09/06/2019  HTN (hypertension)   MSSA (methicillin susceptible Staphylococcus aureus) septicemia (HCC) 06/09/2012  Nonrheumatic aortic valve insufficiency 07/19/2019  OSA (obstructive sleep apnea) 04/19/2013  Dr Jerre Simon    PAF (paroxysmal atrial fibrillation) (HCC) 06/03/2019  during admission for DRESS  Septic shock (HCC) 05/31/2019  Shock (HCC) 05/31/2019 Past Surgical History: Past Surgical History: Procedure Laterality Date  CARDIAC CATHETERIZATION  2014  CARDIOVERSION N/A 06/07/2019  Procedure:  CARDIOVERSION;  Surgeon: Little Ishikawa, MD;  Location: [facility] ENDOSCOPY;  Service: Cardiovascular;  Laterality: N/A;  TEE WITHOUT CARDIOVERSION N/A 06/07/2019  Procedure: TRANSESOPHAGEAL ECHOCARDIOGRAM (TEE);  Surgeon: Little Ishikawa, MD;  Location: [facility] ENDOSCOPY;  Service: Cardiovascular;  Laterality: N/A; Jill Side. Samson Frederic, MA CCC/SLP Clinical Specialist - Acute Care SLP Acute Rehabilitation Services Office number [phone] [name] 04/15/2023, 2:01 PM   sodium chloride   Intravenous Once   acetaminophen  650 mg Oral Q4H   Or   acetaminophen (TYLENOL) oral liquid 160 mg/5 mL  650 mg Per Tube Q4H   Or   acetaminophen  650 mg Rectal Q4H   amiodarone  400 mg Oral BID   apixaban  2.5 mg Oral BID   aspirin  81 mg Oral Daily   atorvastatin  40 mg Oral Daily   Chlorhexidine Gluconate Cloth  6 each Topical Q0600   darbepoetin (ARANESP) injection - DIALYSIS  60 mcg Subcutaneous Q Wed-1800   docusate  100 mg Per Tube BID   famotidine  20 mg Per Tube BID   folic acid  1 mg Oral Daily   Gerhardt's butt cream   Topical TID   insulin aspart  0-6 Units Subcutaneous Q4H   midodrine  15 mg Oral TID WC   multivitamin  1 tablet Oral BID   nicotine  21 mg Transdermal Daily   mouth rinse  15 mL Mouth Rinse Q2H   senna  1 tablet Oral Daily  thiamine  100 mg Oral Daily    BMET    Component Value Date/Time   NA 129 (L) 04/27/2023 0432   K 4.5 04/26/2023 0432   CL 89 (L) 05/13/2023 0432   CO2 22 05/07/2023 0432   GLUCOSE 177 (H) 05/12/2023 0432   BUN 96 (H) 04/29/2023 0432   CREATININE 5.13 (H) 05/02/2023 0432   CALCIUM 8.6 (L) 05/01/2023 0432   GFRNONAA 11 (L) 05/13/2023 0432   GFRAA 30 (L) 06/07/2019 0321   CBC    Component Value Date/Time   WBC 13.5 (H) 04/20/2023 0432   RBC 2.17 (L) 04/24/2023 0432   HGB 7.0 (L) 05/07/2023 0432   HCT 21.9 (L) 05/01/2023 0432   PLT 189 05/05/2023 0432   MCV 100.9 (H) 05/12/2023 0432   MCH 32.3 05/05/2023 0432   MCHC 32.0  05/10/2023 0432   RDW 18.4 (H) 04/16/2023 0432   LYMPHSABS 0.4 (L) 03/30/2023 0251   MONOABS 1.1 (H) 03/30/2023 0251   EOSABS 0.0 03/30/2023 0251   BASOSABS 0.0 03/30/2023 0251     Pt is a 83 y.o. yo male  with PMH significant for hypertension, type II DM, CAD, A-fib, CKD (f/b Dr. Glenna Fellows) was admitted with shortness of breath, seen as a consultation for the evaluation and management of acute kidney injury.    Assessment/Plan:   AKI/CKD stage IV - ischemic ATN in setting of septic shock.  [place] without obstruction.  Refractory to diuretics and hypotension, had  CRRT 1/15-1/21/25 after temp HD catheter placed by PCCM.  Off of CRRT and had IHD  1/29 and  again 1/31 and plan was to keep on MWF schedule . he was  having MAJOR issues tolerating HD now times 2.  Dr. Arrie Aran had a frank conversation with he and his wife regarding this and they are amenable to speaking with Palliative care-  had not happened yet.    Did not seem to be considering anything like comfort care per my discussion with them 1/31  but then patient worse and did not tolerate dialysis again.  I had told family that we need to think long and hard if we are to put him through another dialysis treatment if this is how he was going to react.  Obviously if we were to not do any more dialysis his prognosis is days and it would be reasonable to transition to comfort care.  Now s/p arrest and in ICU.  Nursing tells me there is GOC meeting this AM. If aggressive care is desired would need CRRT again but do not feel that would be in his best interest or change his prognosis Vascular access - Left internal jugular temp HD catheter placed 04/08/23 --   Can keep temp cath in right now has only been in 8 days -  not sure we will be doing HD again  Septic shock due to staph  bacteremia - per PCCM Staph aureus empyema/Right pleural effusion - s/p chest tube.  ID following and on cefazolin. Acute RV failure with CHF -  UF as tolerated with HD.    Chronic atrial fibrillation with RVR- possible DCCV when more stable.  On amiodarone and heparin drip.  Metoprolol 12.5 mg started last night.  Cardiology signed off.  Acute on chronic Anemia - transfuse for Hgb <7.  Received blood 1/28 due to Hgb of 6.5.  Improved to 8 today.  Continue to follow. Added ESA. 60 weekly  Severe protein malnutrition - per primary Disposition - poor overall  prognosis.  Recommend palliative care consult to set goals/limits of care.  He is a marginal HD candidate given advanced age, biventricular heart failure, hypotension, multiple co-morbidities, and poor functional and nutritional status.  He has now had 2 episodes after HD req rapid response to see him- I am afraid that he is not going to do well regardless and I would be in favor of transition to comfort care   Cecille Aver  [facility]

## 2023-05-14 NOTE — Progress Notes (Signed)
eLink Physician-Brief Progress Note Patient Name: Zachary Parrish DOB: 01-11-1941 MRN: [id number]   Date of Service  05/03/2023  HPI/Events of Note  83 year old male with a history of Staphylococcus empyema, kidney injury requiring renal replacement therapy, heart failure, and anemia who developed a PEA arrest and was intubated and transferred to the ICU for further management.  Patient is on low-dose norepinephrine and propofol.  Mechanically ventilated.  Results consistent with respiratory acidosis acute kidney injury, azotemia, anion gap metabolic acidosis with elevated troponin and anemia/leukocytosis.  Loculated right-sided effusion  eICU Interventions  Daily spontaneous awakening trials and breathing trials.  Ongoing goals of care discussions.  Maintain supportive care for now, although prognosis is extremely poor.  Apixaban for DVT prophylaxis GI prophylaxis with famotidine     Intervention Category Evaluation Type: New Patient Evaluation  Matan Steen 04/28/2023, 3:18 AM

## 2023-05-14 NOTE — Procedures (Signed)
Patient Name: Zachary Parrish  MRN: [id number]  Epilepsy Attending: Charlsie Quest  Referring Physician/Provider: Cristopher Peru, PA-C  Date: 05/06/2023 Duration: 23.50 mins  Patient history: 83yo m s/p cardiac arrest. EEG to evaluate for seizure  Level of alertness: comatose  AEDs during EEG study: Propofol  Technical aspects: This EEG study was done with scalp electrodes positioned according to the 10-20 International system of electrode placement. Electrical activity was reviewed with band pass filter of 1-70Hz , sensitivity of 7 uV/mm, display speed of 44mm/sec with a 60Hz  notched filter applied as appropriate. EEG data were recorded continuously and digitally stored.  Video monitoring was available and reviewed as appropriate.  Description: EEG showed continuous generalized 3 to 6 Hz theta-delta slowing. Hyperventilation and photic stimulation were not performed.     ABNORMALITY - Continuous slow, generalized  IMPRESSION: This study is suggestive of moderate diffuse encephalopathy. No seizures or epileptiform discharges were seen throughout the recording.  Zachary Parrish Annabelle Harman

## 2023-05-14 NOTE — Progress Notes (Signed)
Palliative Medicine Progress Note   Patient Name: Zachary Parrish       Date: 05/10/2023 DOB: [birth year]  Age: 83 y.o. MRN#: [id number] Attending Physician: Cheri Fowler, MD Primary Care Physician: Verl Bangs, MD Admit Date: 03/29/2023   HPI/Patient Profile: 83 y.o. male  with past medical history of A-fib s/p DCCV, carotic stenosis, CKD stage 3b, CAD, DM type II, HLD, and HTN who was admitted on 03/29/2023 with a large right empyema.  Hospital course complicated by septic shock and new dialysis requirement due to AKI on CKD.  He initially required CRRT (1/15 through 1/21 but has since transitioned to [facility].   Palliative Medicine was consulted for goals of care and medical decision making.    Subjective: Chart reviewed including labs, vital signs, imaging, progress/consult notes, orders, and medications.   Overnight, patient had PEA arrest likely secondary to respiratory arrest and hypoxia.  He was intubated and transferred to ICU.  Family Meeting: PCCM Delorise Shiner NP) and I met with patient's wife/Mary Erskine Squibb, son/Brian, and daughter-in-law/Kathleen in the [facility] consult room.  We reviewed the events overnight, as well as his difficult hospital course even prior to the cardiac arrest.   We discussed  patient's issues of respiratory failure, significant underlying infection (empyema) with incomplete source control, weakness/debility, and AKI progressed to ESRD.   We discussed the limitations of medical interventions to prolong quality of life when the body starts to fail.  We discussed that best case scenario if patient were able to survive this hospitalization, he would most likely be dialysis dependent and require 24/7 care in a facility.  Family shares that patient has always greatly valued his  independence and that this would not be consistent with good quality of life for him.  After further discussion, family is in agreement to transition to comfort care.  We reviewed this would mean no further painful interventions (labs, CBG monitoring), utilizing medication to alleviate discomfort, and liberalization from the ventilator.  Discussed that prognosis would likely be hours to days.  Emotional support provided.  Questions and concerns were addressed.  Objective:  Physical Exam Vitals reviewed.  Constitutional:      Interventions: He is sedated.     Comments: Critically ill appearing  Cardiovascular:     Rate and Rhythm: Normal rate.  Pulmonary:     Comments: Intubated  Palliative Medicine Assessment & Plan   Assessment: Principal Problem:   Empyema of right pleural space (HCC) Active Problems:   Type 2 diabetes mellitus with complication, without long-term current use of insulin (HCC)   Acute diastolic heart failure (HCC)   Thyroid lesion   Pleural effusion   Anemia of chronic disease   Alcohol abuse   CAP (community acquired pneumonia)   ESRD (end stage renal disease) on dialysis (HCC)   Staph aureus infection   Need for management of chest tube   Atrial fibrillation with RVR (HCC)   Abnormality of gait due to impairment of balance   Decreased mobility and endurance   Malnutrition of moderate degree   Pressure injury of skin   Constipation   Tremor   Acute respiratory failure with hypoxia (HCC)   Cardiac arrest (HCC)   DNR (do not resuscitate)   Encounter for palliative care   Goals of care, counseling/discussion    Recommendations/Plan: Transition to comfort focused care No further labs are CBG monitoring Fentanyl infusion for comfort Liberalization from the ventilator - timing TBD PMT will remain available to support as needed  Code Status: DNR - comfort   Prognosis:  Hours - Days  Discharge Planning: Anticipated Hospital  Death   Thank you for allowing the Palliative Medicine Team to assist in the care of this patient.   Time: 55 minutes   Merry Proud, NP   Please contact Palliative Medicine Team phone at [phone] for questions and concerns.  For individual providers, please see AMION.

## 2023-05-14 NOTE — Consult Note (Signed)
NAME:  Zachary Parrish, MRN:  [id number], DOB:  June 27, 1940, LOS: 19 ADMISSION DATE:  03/29/2023, CONSULTATION DATE:  04/16/23 REFERRING MD:  Danise Edge, CHIEF COMPLAINT:  cardiac arrest    History of Present Illness:  83 year old male with prolonged hospital stay. Initially presented to the emergency department 1/14 for exertional dyspnea, 4 pillow orthopnea. Had a CT chest on admission showing a complex large right pleural effusion with pleural thickening and rind. Pulmonary was asked to evaluate at that time. He had a chest tube placed and had pleural fluid studies completed showing exudative effusion. Thought that this was may be cardiorenal however cultures came back with MSSA consistent with Staph aureus empyema. Unclear if this began as a pneumonia or seeded from his history of staph bacteremia.  During his admission he was transferred to the ICU for septic shock and and AKI with anuria.  He was started on CRRT and ID was consulted.  He was treated with cefazolin and had 2 doses of pleural lytics.  The chest tube was removed, and he was transferred out of the ICU on 04/10/2023.  On 04/13/2023 he had A-fib RVR as well as pulmonary reconsult for increasing cough, reaccumulation of his pleural effusion.  It was recommended at that time for no further intervention and to optimize his fluid status with albumin and dialysis. On 2/1 late ~ 1140, patient had PEA arrest likely 2/2 respiratory arrest and hypoxia. Downtime on code sheet is 14 minutes, however, have also been told 4 minutes of CPR. The patient was intubated and transferred to ICU.   Pertinent  Medical History  Paroxysmal atrial fibrillation, history of Staph aureus bacteremia, hypertension, gout, dyslipidemia, diabetes, CAD, CKD stage IV, CAD   Significant Hospital Events: Including procedures, antibiotic start and stop dates in addition to other pertinent events   1/14: Admit, chest tube 1/15: Staph aureus on pleural cultures, pleural lytics.   Transferred to ICU for hypotension ?  Septic shock, anuric 1/16: CRRT, vasopressors.  ID consulted and recommended cefazolin 1/17: Repeat tube lytics 1/20: Chest tube removed 1/26: Transfer out of ICU 1/28: 1 unit PRBC transfusion 1/29: RVR and cards recommending low-dose metoprolol.  Pulmonary reconsulted regarding increased cough, worsening chest x-ray - no intervention  1/31: worsening fever, AMS (waxing and waning), intermittent resp difficulty and NTS with thick secretions  2/1: late PM PEA arrest >intubated>ICU  Interim History / Subjective:  Cardiac arrest with total down time ~5 minutes-14 minutes? Discrepancy in report. From report, hypoxic leading to bradycardia and PEA arrest.   Objective   Blood pressure 126/71, pulse 90, temperature 98.3 F (36.8 C), temperature source Oral, resp. rate 20, height 6\' 1"  (1.854 m), weight 99.5 kg, SpO2 98%.        Intake/Output Summary (Last 24 hours) at 05/10/2023 0001 Last data filed at 04/16/2023 0130 Gross per 24 hour  Intake 325 ml  Output 0 ml  Net 325 ml   Filed Weights   04/15/23 0509 04/15/23 1815 04/16/23 0412  Weight: 99.6 kg 97.6 kg 99.5 kg    Examination: General: older male, critically ill appearing, intubated HENT: pupils equal and reactive, anicteric sclera, ETT, OGT Lungs: right side diminished and rhonchorous, course sounds on left, no wheezing. Vented  Cardiovascular: irregularly irregular rhythm rate 110s, no pitting edema  Abdomen: rounded, soft, OGT to LWS Extremities: no pitting edema, scattered bruising Neuro: does not follow commands, ?seizure activity en route to ICU which resolved spontaneously GU: foley  Resolved Hospital Problem list  Assessment & Plan:  Cardiac arrest likely 2/2 respiratory arrest. Bedside RN report sats in 60s, then unresponsive. Bradycardic and PEA arrested. Downtime ~ 5 minutes.  - admit to ICU for close monitoring - stat labs including CBC, BMP, mag, ABG - f/u pCXR for  intubation  - EEG (?seizure activity en route to ICU which spontaneously abated)  - TTM/normothermia protocol - goal MAP > 65 - fluid resuscitation as tolerated  - trend troponin - f/u Echo   Acute hypoxic respiratory failure likely 2/2 below. Now with respiratory arrest and cardiac arrest.  Right lung empyema and pneumonia, MSSA s/p chest tube and lytics; CT removed 1/21  Trapped lung  Reaccumulation of pleural effusion  CT with large right-sided pleural effusion with a rind consistent with empyema.  Had chest tube placement, pleural fluid +MSSA. On cefazolin  Had initial chest tube from 1/14 - 1/20. 1/29 Pulm reconsulted with recommendation of no intervention with empyema s/p pleural drainage with ex vacuo.  - pCXR now  - full mechanical vent support - lung protective ventilation 6-8cc/kg Vt - VAP and PAD bundle in place  - titrate FiO2 to sat goal >92  - maintain peak/plats <30, driving pressures <86   - no interventions for the above explained empyema  Atrial fibrillation with intermittent RVR Diastolic heart failure; echo 1/15 EF 50-55%, RV mildly enlarged, LA mod dilated, moderate LV hypertrophy  - con't amio - con't asa and eliquis  - f/u repeat echo after cardiac arrest   Acute on chronic renal failure  ESRD on HD s/p HD cath placement. Last HD 1/31 - nephro following, appreciate recs  - no tolerating HD, rec GOC discussion  - trend bmp, mag, phos - I/o - renall dose meds   Hypotension; waxing and waning throughout his admission  - con't midodrine 15mg  TID   Anemia of chronic disease - trend - transfuse <7  Type 2 diabetes - CBG q4h  - SSI   Malnutrition - will need TF  - con't thiamine, MTVN, folic acid   GOC: IMTS calling wife after transport to ICU. Wife trying to come up to hospital tonight. At this point patient would greatly benefit from at minimum transition to DNR status.  Best Practice (right click and "Reselect all SmartList Selections" daily)    Diet/type: NPO DVT prophylaxis: DOAC Pressure ulcer(s): stage II sacrum  GI prophylaxis: PPI Lines: Dialysis Catheter and yes and it is still needed Foley:  Yes, and it is still needed Code Status:  full code Last date of multidisciplinary goals of care discussion [pending]  Labs   CBC: Recent Labs  Lab 04/12/23 0506 04/12/23 1441 04/13/23 0146 04/14/23 0630 04/15/23 0325 04/16/23 0145  WBC 11.5*  --  13.7* 11.7* 13.7* 13.4*  HGB 6.5* 8.1* 8.0* 7.0* 7.2* 7.4*  HCT 20.0* 23.3* 24.5* 21.2* 22.0* 22.8*  MCV 99.0  --  97.2 97.7 98.7 101.3*  PLT 230  --  232 181 186 201    Basic Metabolic Panel: Recent Labs  Lab 04/12/23 0506 04/13/23 0146 04/13/23 0424 04/14/23 0630 04/15/23 0325 04/16/23 0145  NA 126* 125*  --  130* 126* 132*  K 4.6 4.9  --  4.0 4.1 4.1  CL 87* 85*  --  90* 87* 91*  CO2 26 24  --  26 24 28   GLUCOSE 224* 157*  --  217* 152* 181*  BUN 118* 141*  --  94* 110* 69*  CREATININE 5.27* 6.23*  --  4.41* 5.20* 3.72*  CALCIUM 7.7* 7.9*  --  7.8* 8.1* 8.3*  MG 2.1 2.2 2.3 2.0 2.0 2.1  PHOS 4.5 6.2*  --  3.2 4.7* 3.7   GFR: Estimated Creatinine Clearance: 19.3 mL/min (A) (by C-G formula based on SCr of 3.72 mg/dL (H)). Recent Labs  Lab 04/13/23 0146 04/14/23 0630 04/15/23 0325 04/16/23 0145 04/16/23 0745  WBC 13.7* 11.7* 13.7* 13.4*  --   LATICACIDVEN  --   --   --  0.7 0.5    Liver Function Tests: Recent Labs  Lab 04/12/23 0506 04/13/23 0146 04/14/23 0630 04/15/23 0325 04/16/23 0145  ALBUMIN 1.8* 1.9* 1.9* 2.9* 2.7*   No results for input(s): "LIPASE", "AMYLASE" in the last 168 hours. No results for input(s): "AMMONIA" in the last 168 hours.  ABG    Component Value Date/Time   PHART 7.295 (L) 05/31/2019 2340   PCO2ART 30.2 (L) 05/31/2019 2340   PO2ART 82.0 (L) 05/31/2019 2340   HCO3 29.1 (H) 04/13/2023 2320   TCO2 28 03/29/2023 1017   ACIDBASEDEF 11.0 (H) 05/31/2019 2340   O2SAT 78.2 04/13/2023 2320     Coagulation Profile: No  results for input(s): "INR", "PROTIME" in the last 168 hours.  Cardiac Enzymes: No results for input(s): "CKTOTAL", "CKMB", "CKMBINDEX", "TROPONINI" in the last 168 hours.  HbA1C: Hgb A1c MFr Bld  Date/Time Value Ref Range Status  03/29/2023 06:32 PM 6.6 (H) 4.8 - 5.6 % Final    Comment:    (NOTE) Pre diabetes:          5.7%-6.4%  Diabetes:              >6.4%  Glycemic control for   <7.0% adults with diabetes   06/01/2019 03:39 AM 7.2 (H) 4.8 - 5.6 % Final    Comment:    (NOTE) Pre diabetes:          5.7%-6.4% Diabetes:              >6.4% Glycemic control for   <7.0% adults with diabetes     CBG: Recent Labs  Lab 04/16/23 0115 04/16/23 0656 04/16/23 1203 04/16/23 1702 04/16/23 2154  GLUCAP 167* 174* 138* 154* 164*    Review of Systems:   As above  Past Medical History:  He,  has a past medical history of Acute renal failure (ARF) (HCC) (05/31/2019), B12 deficiency (08/02/2019), Bilateral carotid artery stenosis (05/31/2021), CAD (coronary artery disease) (2014), Chronic kidney disease, stage IV (severe) (HCC) (07/19/2019), Coronary artery disease involving native coronary artery of native heart without angina pectoris (07/19/2019), DM (diabetes mellitus) (HCC), Dyslipidemia (11/22/2019), Essential hypertension (07/19/2019), Gout, Gouty arthritis (09/06/2019), HTN (hypertension), MSSA (methicillin susceptible Staphylococcus aureus) septicemia (HCC) (06/09/2012), Nonrheumatic aortic valve insufficiency (07/19/2019), OSA (obstructive sleep apnea) (04/19/2013), PAF (paroxysmal atrial fibrillation) (HCC) (06/03/2019), Septic shock (HCC) (05/31/2019), and Shock (HCC) (05/31/2019).   Surgical History:   Past Surgical History:  Procedure Laterality Date   CARDIAC CATHETERIZATION  2014   CARDIOVERSION N/A 06/07/2019   Procedure: CARDIOVERSION;  Surgeon: Little Ishikawa, MD;  Location: [facility] ENDOSCOPY;  Service: Cardiovascular;  Laterality: N/A;   TEE WITHOUT  CARDIOVERSION N/A 06/07/2019   Procedure: TRANSESOPHAGEAL ECHOCARDIOGRAM (TEE);  Surgeon: Little Ishikawa, MD;  Location: [facility] ENDOSCOPY;  Service: Cardiovascular;  Laterality: N/A;     Social History:   reports that he has quit smoking. He uses smokeless tobacco. He reports current alcohol use. He reports that he does not use drugs.   Family History:  His family history includes Heart attack in  his father; Osteoporosis in his mother.   Allergies Allergies  Allergen Reactions   Allopurinol     Possible DRESS syndrome   Colchicine     Possible DRESS syndrome     Home Medications  Prior to Admission medications   Medication Sig Start Date End Date Taking? Authorizing Provider  aspirin 81 MG chewable tablet Chew 162 mg by mouth daily.   Yes [provider]  atorvastatin (LIPITOR) 40 MG tablet Take 40 mg by mouth daily. 03/27/19  Yes [provider]  carvedilol (COREG) 25 MG tablet Take 1 tablet (25 mg total) by mouth 2 (two) times daily. Hold dose if heart rate is less than 50 06/07/19  Yes Kyle, Tyrone A, DO  Coenzyme Q10 300 MG CAPS Take 1 capsule by mouth daily.   Yes [provider]  Cyanocobalamin (B-12 PO) Take by mouth.   Yes [provider]  furosemide (LASIX) 40 MG tablet Take 40 mg by mouth daily.    Yes [provider]  Multiple Vitamins-Minerals (OCUVITE PO) Take 1 capsule by mouth at bedtime.   Yes [provider]  Omega-3 Fatty Acids (FISH OIL) 1000 MG CAPS Take 2,000 capsules by mouth 2 (two) times daily.   Yes [provider]  potassium chloride (MICRO-K) 10 MEQ CR capsule Take 20 mEq by mouth 2 (two) times daily.   Yes [provider]  telmisartan (MICARDIS) 40 MG tablet Take 40 mg by mouth daily.   Yes [provider]     Critical care time: 72    Lenard Galloway [facility] 04/27/2023 12:06 AM  Please see [email or web address] for pager details.  From 7A-7P if  no response, please call [phone] After hours, please call ELink [phone]

## 2023-05-14 NOTE — IPAL (Signed)
  Interdisciplinary Goals of Care Family Meeting   Date carried out: 05/11/2023  Location of the meeting: Bedside  Member's involved: Family Member or next of kin  Durable Power of Attorney or acting medical decision maker: wife, Kemet Nijjar    Discussion: We discussed goals of care for [employer]. Discussion was with wife, Corrie Dandy, son and daughter-in-law. Reviewed events overnight of cardiac arrest, intubation, now in ICU. Briefly, reviewed his hospitalization which has been extensive. Multiple underlying comorbidities. We discussed that his respiratory status has been declining. Additionally, he has not been able to tolerate HD due to RVR and nephrologist recommendation for GOC discussion as well. They do have a palliative meeting scheduled for 10AM this morning. Mary relayed that patient stated he wanted to be resuscitated "once." I encouraged them to discuss code status tonight with each other. I recommended no further CPR if he were to code again and making him DNR at this time as QoL outlook is poor at this time. They would like time to visit with him and further discussion/decisions this morning.   Code status:   Code Status: Full Code   Disposition: Continue current acute care  Time spent for the meeting: 20  Cristopher Peru, PA-C [facility] 04/28/2023 1:22 AM  Please see [email or web address] for pager details.  From 7A-7P if no response, please call [phone] After hours, please call ELink [phone]

## 2023-05-14 NOTE — Progress Notes (Signed)
NAME:  Zachary Parrish, MRN:  [id number], DOB:  [birth year], LOS: 19 ADMISSION DATE:  03/29/2023, CONSULTATION DATE:  04/16/23 REFERRING MD:  Danise Edge, CHIEF COMPLAINT:  cardiac arrest    History of Present Illness:  83 year old male with prolonged hospital stay. Initially presented to the emergency department 1/14 for exertional dyspnea, 4 pillow orthopnea. Had a CT chest on admission showing a complex large right pleural effusion with pleural thickening and rind. Pulmonary was asked to evaluate at that time. He had a chest tube placed and had pleural fluid studies completed showing exudative effusion. Thought that this was may be cardiorenal however cultures came back with MSSA consistent with Staph aureus empyema. Unclear if this began as a pneumonia or seeded from his history of staph bacteremia.  During his admission he was transferred to the ICU for septic shock and and AKI with anuria.  He was started on CRRT and ID was consulted.  He was treated with cefazolin and had 2 doses of pleural lytics.  The chest tube was removed, and he was transferred out of the ICU on 04/10/2023.  On 04/13/2023 he had A-fib RVR as well as pulmonary reconsult for increasing cough, reaccumulation of his pleural effusion.  It was recommended at that time for no further intervention and to optimize his fluid status with albumin and dialysis. On 2/1 late ~ 1140, patient had PEA arrest likely 2/2 respiratory arrest and hypoxia. Downtime on code sheet is 14 minutes, however, have also been told 4 minutes of CPR. The patient was intubated and transferred to ICU.   Pertinent  Medical History  Paroxysmal atrial fibrillation, history of Staph aureus bacteremia, hypertension, gout, dyslipidemia, diabetes, CAD, CKD stage IV, CAD   Significant Hospital Events: Including procedures, antibiotic start and stop dates in addition to other pertinent events   1/14: Admit, chest tube 1/15: Staph aureus on pleural cultures, pleural lytics.   Transferred to ICU for hypotension ?  Septic shock, anuric 1/16: CRRT, vasopressors.  ID consulted and recommended cefazolin 1/17: Repeat tube lytics 1/20: Chest tube removed 1/26: Transfer out of ICU 1/28: 1 unit PRBC transfusion 1/29: RVR and cards recommending low-dose metoprolol.  Pulmonary reconsulted regarding increased cough, worsening chest x-ray - no intervention  1/31: worsening fever, AMS (waxing and waning), intermittent resp difficulty and NTS with thick secretions  2/1: late PM PEA arrest >intubated>ICU 2/2 palliative mtg 1030a   Interim History / Subjective:  Arrest overnight  Run of VT when briefly in PSV to assess pulm mechanics   Objective   Blood pressure (!) 93/58, pulse 74, temperature 98.8 F (37.1 C), resp. rate (!) 22, height 6\' 1"  (1.854 m), weight 103.7 kg, SpO2 100%.    Vent Mode: PRVC FiO2 (%):  [30 %-100 %] 30 % Set Rate:  [22 bmp] 22 bmp Vt Set:  [480 mL-640 mL] 640 mL PEEP:  [5 cmH20] 5 cmH20 Plateau Pressure:  [25 cmH20-28 cmH20] 26 cmH20   Intake/Output Summary (Last 24 hours) at 05/01/2023 1133 Last data filed at 05/12/2023 1109 Gross per 24 hour  Intake 1267.92 ml  Output --  Net 1267.92 ml   Filed Weights   04/15/23 1815 04/16/23 0412 05/03/2023 0500  Weight: 97.6 kg 99.5 kg 103.7 kg    Examination: General: critically and chronically ill elderly M intubated lightly sedated  Neuro: grimaces and moves to pain. Does not follow commands   HENT: NCAT ETT secure  Lungs: Vt 50cc on PSV  Cardiovascular: rr cap refill < 3 sec  Abdomen: round soft  Extremities: Some chronic appearing arthritic changes. No acute appearing joint deformity  Skin: pale c/d scattered ecchymosis  GU: foley   Resolved Hospital Problem list    Assessment & Plan:   GOC Encounter for palliative Care DNR status  Cardiac arrest Elevated troponin, expected  Hypotension  Acute hypoxic resp failure R MSSA empyema, PNA s/p chest tube and course of lytics  Trapped  lung, with recurring pleural effusion  Afib RVR BiV HF  Hypotension  Acute renal failure superimposed on CKD  Anemia of chronic disease, critical illness  Type 2 diabetes Malnutrition Physical deconditioning  Hyponatremia Hyperphosphatemia Hx tobacco use Hx Etoh use  P Multidisciplinary discussion with PCCM, PCM, pts wife, daughter, son and daughter in Social worker. We talked about the events of last night as well as his course this hospitalization and how the sequelae of these may/may not align with his QOL goals.  A decision was reached to compassionately shift our medical focus toward comfort-focussed care.  -DNR  -We will cease painful/invasive interventions including labs -Stop medications/ interventions not aimed at optimizing comfort -Optimize analgesia for comfort -When ready, compassionate liberation from mechanical ventilation.  -Anticipate in-hospital death, likely minutes-hours following extubation based on his lung mechanics.    Best Practice (right click and "Reselect all SmartList Selections" daily)   Diet/type: NPO DVT prophylaxis: na Pressure ulcer(s): stage II sacrum  GI prophylaxis: na Lines: Dialysis Catheter and yes and it is still needed Foley:  Yes, and it is still needed Code Status:  DNR Last date of multidisciplinary goals of care discussion [2/2  Labs   CBC: Recent Labs  Lab 04/14/23 0630 04/15/23 0325 04/16/23 0145 04/19/2023 0118 04/20/2023 0142 05/05/2023 0421 05/01/2023 0432  WBC 11.7* 13.7* 13.4* 14.6*  --   --  13.5*  HGB 7.0* 7.2* 7.4* 7.0* 7.8* 7.8* 7.0*  HCT 21.2* 22.0* 22.8* 23.0* 23.0* 23.0* 21.9*  MCV 97.7 98.7 101.3* 104.5*  --   --  100.9*  PLT 181 186 201 215  --   --  189    Basic Metabolic Panel: Recent Labs  Lab 04/13/23 0146 04/13/23 0424 04/14/23 0630 04/15/23 0325 04/16/23 0145 04/20/2023 0118 05/08/2023 0142 05/10/2023 0421 04/22/2023 0432  NA 125*  --  130* 126* 132* 132* 128* 129* 129*  K 4.9  --  4.0 4.1 4.1 4.9 4.6 4.6 4.5   CL 85*  --  90* 87* 91* 90*  --   --  89*  CO2 24  --  26 24 28 24   --   --  22  GLUCOSE 157*  --  217* 152* 181* 210*  --   --  177*  BUN 141*  --  94* 110* 69* 95*  --   --  96*  CREATININE 6.23*  --  4.41* 5.20* 3.72* 5.00*  --   --  5.13*  CALCIUM 7.9*  --  7.8* 8.1* 8.3* 8.6*  --   --  8.6*  MG 2.2 2.3 2.0 2.0 2.1 2.3  --   --   --   PHOS 6.2*  --  3.2 4.7* 3.7  --   --   --  5.6*   GFR: Estimated Creatinine Clearance: 14.3 mL/min (A) (by C-G formula based on SCr of 5.13 mg/dL (H)). Recent Labs  Lab 04/15/23 0325 04/16/23 0145 04/16/23 0745 04/24/2023 0118 04/29/2023 0432  WBC 13.7* 13.4*  --  14.6* 13.5*  LATICACIDVEN  --  0.7 0.5  --   --  Liver Function Tests: Recent Labs  Lab 04/13/23 0146 04/14/23 0630 04/15/23 0325 04/16/23 0145 05/06/2023 0432  ALBUMIN 1.9* 1.9* 2.9* 2.7* 2.3*   No results for input(s): "LIPASE", "AMYLASE" in the last 168 hours. No results for input(s): "AMMONIA" in the last 168 hours.  ABG    Component Value Date/Time   PHART 7.440 05/03/2023 0421   PCO2ART 37.2 04/27/2023 0421   PO2ART 484 (H) 04/16/2023 0421   HCO3 25.3 04/26/2023 0421   TCO2 26 05/12/2023 0421   ACIDBASEDEF 2.0 05/08/2023 0142   O2SAT 100 05/06/2023 0421     Coagulation Profile: No results for input(s): "INR", "PROTIME" in the last 168 hours.  Cardiac Enzymes: No results for input(s): "CKTOTAL", "CKMB", "CKMBINDEX", "TROPONINI" in the last 168 hours.  HbA1C: Hgb A1c MFr Bld  Date/Time Value Ref Range Status  03/29/2023 06:32 PM 6.6 (H) 4.8 - 5.6 % Final    Comment:    (NOTE) Pre diabetes:          5.7%-6.4%  Diabetes:              >6.4%  Glycemic control for   <7.0% adults with diabetes   06/01/2019 03:39 AM 7.2 (H) 4.8 - 5.6 % Final    Comment:    (NOTE) Pre diabetes:          5.7%-6.4% Diabetes:              >6.4% Glycemic control for   <7.0% adults with diabetes     CBG: Recent Labs  Lab 04/16/23 1702 04/16/23 2154 05/03/2023 0014  04/23/2023 0317 05/01/2023 0750  GLUCAP 154* 164* 177* 167* 128*   CRITICAL CARE Performed by: Lanier Clam   Total critical care time: 62 minutes  Critical care time was exclusive of separately billable procedures and treating other patients. Critical care was necessary to treat or prevent imminent or life-threatening deterioration.  Critical care was time spent personally by me on the following activities: development of treatment plan with patient and/or surrogate as well as nursing, discussions with consultants, evaluation of patient's response to treatment, examination of patient, obtaining history from patient or surrogate, ordering and performing treatments and interventions, ordering and review of laboratory studies, ordering and review of radiographic studies, pulse oximetry and re-evaluation of patient's condition.  Tessie Fass MSN, AGACNP-BC [facility] Pulmonary/Critical Care Medicine Amion for pager  04/29/2023, 11:33 AM

## 2023-05-14 NOTE — Progress Notes (Signed)
 EEG complete - results pending

## 2023-05-14 NOTE — Progress Notes (Signed)
Pt extubated at this time per family wishes and per order.

## 2023-05-14 NOTE — Consult Note (Addendum)
Palliative Care Consult Note                                  Date: 04/27/2023   Patient Name: Zachary Parrish  DOB: Jul 13, 1940  MRN: [id number]  Age / Sex: 83 y.o., male  PCP: Verl Bangs, MD Referring Physician: Cheri Fowler, MD  Reason for Consultation: Establishing goals of care  HPI/Patient Profile: 83 y.o. male  with past medical history of A-fib s/p DCCV, carotic stenosis, CKD stage 3b, CAD, DM type II, HLD, and HTN who was admitted on 03/29/2023 with a large right empyema.  Hospital course complicated by septic shock and new dialysis requirement due to AKI on CKD.  He initially required CRRT (1/15 through 1/21 but has since transitioned to [facility].  Palliative Medicine was consulted for goals of care and medical decision making.   Subjective:   Extensive chart review has been including labs, vital signs, imaging, progress/consult notes, orders, medications and available advance directive documents.   Update received from RN.  Discussed with Family Medicine resident Dr. Ardyth Harps.  Patient assessed at bedside.  I spoke with his wife/Mary Erskine Squibb by phone to discuss diagnosis, prognosis, and GOC.  I introduced Palliative Medicine as specialized medical care for patients with serious illness.  Created space and opportunity wife to express thoughts and feelings regarding current medical situation. Values and goals of care were attempted to be elicited.  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  He formally retired in 2017, but continued to work for [employer] (with reduced hours) for several more years.  Patient and wife do not have children together, but patient has a son Arlys John) from a previous relationship.  Prior to admission, patient lives at home with his wife.  He was ambulatory and independent with ADLs, but wife describes an overall decline in the weeks leading up to this  illness.  Clinical assessment and GOC discussion: We discussed patient's current illness and what it means in the larger context of his ongoing co-morbidities. Current clinical status was reviewed.   Conversation had with wife regarding the seriousness of patient's current medical situation.   We discussed his significant underlying infection (empyema), A-fib with RVR, weakness/debility, and AKI likely progressed to ESRD.  Discussed that per nephrology, patient is not tolerating dialysis and that risk of continuing this treatment may outweigh potential benefit.       The patient is high risk to decompensate secondary to his multiple comorbidities.   Discussed code status. I encouraged consideration of DNR/DNI status and provided education on evidence-based poor outcomes in similar hospitalized patients, as the cause of cardiac arrest would likely be associated with advanced illness rather than a reversible condition.  Wife shares that patient has previously stated he would want to be resuscitated "1 time", meaning if the initial attempt at resuscitation fails, he would not want further resuscitation efforts.  Discussed need for a family meeting tomorrow to further discuss goals of care.     Review of Systems  Unable to perform ROS   Objective:   Primary Diagnoses: Present on Admission:  Acute diastolic heart failure (HCC)  Thyroid lesion  Pleural effusion  Anemia of chronic disease  Alcohol abuse  (Resolved) Hypotension  Empyema of right pleural space (HCC)  Staph aureus infection  Atrial fibrillation with RVR (HCC)  Type 2 diabetes mellitus with complication, without long-term current use of insulin (  HCC)  Decreased mobility and endurance  (Resolved) Septic shock (HCC)   Physical Exam Vitals reviewed.  Constitutional:      General: He is not in acute distress.    Appearance: He is ill-appearing.     Comments: Arousable but minimally interactive  Cardiovascular:     Rate  and Rhythm: Normal rate.  Pulmonary:     Effort: No respiratory distress.     Palliative Assessment/Data: PPS 30%     Assessment & Plan:   SUMMARY OF RECOMMENDATIONS   Continue full scope care Family meeting tomorrow - time TBD  Primary Decision Maker: NEXT OF KIN - wife  Code Status/Advance Care Planning: Full code  Prognosis:  Very guarded  Discharge Planning:  To Be Determined    Thank you for allowing [place] to participate in the care of Carolin Coy   Time Total: 75 minutes    Signed by: Sherlean Foot, NP Palliative Medicine Team  Team Phone # [phone]  For individual providers, please see AMION

## 2023-05-14 NOTE — Progress Notes (Signed)
   05/12/2023 0205  Spiritual Encounters  Type of Visit Initial  Care provided to: Patient  Conversation partners present during encounter Nurse  Referral source Code page  Reason for visit Code  OnCall Visit Yes   Chaplain responded to Code Blue.  No support persons/family there with Pt.  Pt unavailable to speak with chaplain.  No further services needed at this time.  Chaplain services remain available by Spiritual Consult or for emergent cases, paging [phone]  Chaplain Raelene Bott, MDiv [email or web address] [phone]

## 2023-05-14 NOTE — Death Summary Note (Signed)
DEATH SUMMARY   Patient Details  Name: Zachary Parrish MRN: [id number] DOB: 1940/09/08  Admission/Discharge Information   Admit Date:  2023/04/09  Date of Death: Date of Death: April 28, 2023  Time of Death: Time of Death: 1545  Length of Stay: 2023/05/15  Referring Physician: Verl Bangs, MD   Reason(s) for Hospitalization  Status post PEA cardiac arrest Demand cardiac ischemia due to chest compression Cardiogenic shock postcardiac arrest Acute hypoxic resp failure with hypoxia and hypercapnia R side MSSA empyema, PNA s/p chest tube and course of lytics  Trapped lung, with recurring pleural effusion  Paroxysmal Afib RVR Chronic BiV systolic heart failure Acute renal failure superimposed on CKD 3 AA requiring hemodialysis Anemia of chronic disease, critical illness  Type 2 diabetes Moderate malnutrition Hyponatremia Hyperphosphatemia Hx tobacco use Hx Etoh use   Diagnoses  Preliminary cause of death: Withdrawal of care in the setting of multisystem organ failure Secondary Diagnoses (including complications and co-morbidities):  Principal Problem:   Empyema of right pleural space (HCC) Active Problems:   Type 2 diabetes mellitus with complication, without long-term current use of insulin (HCC)   Acute diastolic heart failure (HCC)   Thyroid lesion   Pleural effusion   Anemia of chronic disease   Alcohol abuse   CAP (community acquired pneumonia)   ESRD (end stage renal disease) on dialysis (HCC)   Staph aureus infection   Need for management of chest tube   Atrial fibrillation with RVR (HCC)   Abnormality of gait due to impairment of balance   Decreased mobility and endurance   Malnutrition of moderate degree   Pressure injury of skin   Constipation   Tremor   Acute respiratory failure with hypoxia (HCC)   Cardiac arrest (HCC)   DNR (do not resuscitate)   Encounter for palliative care   Goals of care, counseling/discussion   Brief Hospital Course (including  significant findings, care, treatment, and services provided and events leading to death)  Zachary Parrish is a 83 y.o. year old male  with prolonged hospital stay. Initially presented to the emergency department 04-09-2023 for exertional dyspnea, 4 pillow orthopnea. Had a CT chest on admission showing a complex large right pleural effusion with pleural thickening and rind. Pulmonary was asked to evaluate at that time. He had a chest tube placed and had pleural fluid studies completed showing exudative effusion. Thought that this was may be cardiorenal however cultures came back with MSSA consistent with Staph aureus empyema. Unclear if this began as a pneumonia or seeded from his history of staph bacteremia. During his admission he was transferred to the ICU for septic shock and and AKI with anuria. He was started on CRRT and ID was consulted. He was treated with cefazolin and had 2 doses of pleural lytics. The chest tube was removed, and he was transferred out of the ICU on 04/10/2023. On 04/13/2023 he had A-fib RVR as well as pulmonary reconsult for increasing cough, reaccumulation of his pleural effusion. It was recommended at that time for no further intervention and to optimize his fluid status with albumin and dialysis. On 2/1 late ~ 1140, patient had PEA arrest likely 2/2 respiratory arrest and hypoxia. Downtime on code sheet is 14 minutes, however, have also been told 4 minutes of CPR. The patient was intubated and transferred to ICU.   Goals of care discussions were carried with family considering patient was in multisystem organ failure, after long discussion patient's family decided to proceed with comfort care and palliative extubation.  Comfort focused care was started, patient was palliatively extubated and he passed on April 24, 2023 at 3:45 PM.  Patient family was at bedside  Pertinent Labs and Studies  Significant Diagnostic Studies EEG adult Result Date: 04/24/23 Charlsie Quest, MD     2023-04-24  8:40  AM Patient Name: Zachary Parrish MRN: [id number] Epilepsy Attending: Charlsie Quest Referring Physician/Provider: Cristopher Peru, PA-C Date: 04/24/23 Duration: 23.50 mins Patient history: 83yo m s/p cardiac arrest. EEG to evaluate for seizure Level of alertness: comatose AEDs during EEG study: Propofol Technical aspects: This EEG study was done with scalp electrodes positioned according to the 10-20 International system of electrode placement. Electrical activity was reviewed with band pass filter of 1-70Hz , sensitivity of 7 uV/mm, display speed of 24mm/sec with a 60Hz  notched filter applied as appropriate. EEG data were recorded continuously and digitally stored.  Video monitoring was available and reviewed as appropriate. Description: EEG showed continuous generalized 3 to 6 Hz theta-delta slowing. Hyperventilation and photic stimulation were not performed.   ABNORMALITY - Continuous slow, generalized IMPRESSION: This study is suggestive of moderate diffuse encephalopathy. No seizures or epileptiform discharges were seen throughout the recording. Charlsie Quest   Portable Chest x-ray Result Date: 04/24/2023 CLINICAL DATA:  Endotracheal tube placement EXAM: PORTABLE CHEST 1 VIEW COMPARISON:  04/16/2023 FINDINGS: The endotracheal tube is seen 18 mm above the carina. Nasoenteric feeding tube appears coiled within the gastric fundus. Left internal jugular hemodialysis catheter tip noted within the expected superior vena cava Lung volumes are small. Moderate to large right pleural effusion is present demonstrating apical loculated component with compressive atelectasis of the right lung. Small left pleural effusion is also present compressive atelectasis of the left lung base peer pleural effusions appear enlarged since prior examination. Cardiac size is at the upper limits of normal given poor pulmonary insufflation and position. Pulmonary vascularity is. IMPRESSION: 1. Support tubes and lines in appropriate  position. 2. Enlarging moderate to large right pleural effusion with apical loculated component and compressive atelectasis of the right lung. 3. Enlarging, small left pleural effusion with compressive atelectasis of the left lung base. Electronically Signed   By: Helyn Numbers M.D.   On: 24-Apr-2023 01:30   DG Abd 1 View Result Date: 04/16/2023 CLINICAL DATA:  Nasogastric tube placement EXAM: ABDOMEN - 1 VIEW COMPARISON:  Contemporaneous chest radiograph FINDINGS: The enteric tube tip is likely at the stomach, tip either not covered or obscured by enteric contrast. Contrast throughout the colon without over distension. No concerning intra-abdominal mass effect or calcification. IMPRESSION: Minimally covered enteric tube, tip likely at the stomach and obscured. Electronically Signed   By: Tiburcio Pea M.D.   On: 04/16/2023 12:07   DG CHEST PORT 1 VIEW Result Date: 04/16/2023 CLINICAL DATA:  Tachycardia.  Possible aspiration. EXAM: PORTABLE CHEST 1 VIEW COMPARISON:  04/13/2023 FINDINGS: Left central venous catheter with tip over the low SVC region. No pneumothorax. Enteric tube is present with tip off the field of view but below the left hemidiaphragm. Shallow inspiration. Cardiac enlargement. Bilateral pleural effusions, greater on the right. Atelectasis or infiltration in the lung bases. Calcification of the aorta. Similar appearance to previous study. IMPRESSION: Cardiac enlargement. Bilateral pleural effusions with basilar atelectasis, greater on the right. No change. Electronically Signed   By: Burman Nieves M.D.   On: 04/16/2023 01:51   DG Swallowing Func-Speech Pathology Result Date: 04/15/2023 Table formatting from the original result was not included. Modified Barium Swallow Study Patient Details Name: Arnol Mcgibbon  MRN: [id number] Date of Birth: 04-11-1940 Today's Date: 04/15/2023 HPI/PMH: HPI: 83 y.o. male presented to ED 1/14 with SOB and found to have AKI due to ischemic ATN r/t septic shock.  PMH: HTN, T2DM, CAD, A-fib. Dx large R pleural effusion and small L pleural effusion. Chest tube in place and culture grew staph aureus. CRRT 1/16; transitioned to HD M/W/F.  MBS 1/23 aspiration thin liquids; chin tuck/small sips were preventative. Esophagram 1/24: mild-mod esophageal dysmotility; mild tapered narrowing of distal esophagus, recs for endoscopy per imaging note. Diet downgraded to dysphagia 1/nectar-thick liquids 1/27 due to inability to f/c for chin tuck, poorer toleration of diet.  Pt subsequently made NPO due to lethargy.  Cortrak in place. Clinical Impression: Clinical Impression: Pt presents with a primary esophageal dysphagia. Results appear consistent with MBS from 1/23 - esophageal sweeps revealed significant retention of barium within the esophagus with retrograde flow visualized through the PES and filling the pyriform sinuses. The oral phase of the swallow was disorganized with oral delays, barium escape within oral cavity, delayed initiation of tongue movement - all of which are consistent with clinical presentation this last week and related to mental status.  Laryngeal vestibule closure and pharyngeal contraction were relatively normal. Pt had occasional penetration of thin liquids into the vestibule, but no gross aspiration. The primary issue is related to esophageal dysfunction with potential for post-prandial aspiration.  Mrs. Ogan was present for the study. We talked about the results, the concerns related to mental status, being able to take in enough nutrition to meet his needs, and the ways in which his esophageal dysfunction is contributing to the problem.  Per the notes, there is a palliative meeting pending.  She voices feeling discouraged about the future -encouraged her to speak with Palliative Care about her concerns and the options. In the interim, recommend allowing full liquid diet with small sips/bites for pleasure; keep upright for all intake and 30 min after.  SLP will  follow. Factors that may increase risk of adverse event in presence of aspiration [name] [employer] Clearance Coots 2021): Factors that may increase risk of adverse event in presence of aspiration [name] [employer] Clearance Coots 2021): Limited mobility; Frail or deconditioned; Dependence for feeding and/or oral hygiene; Weak cough; Presence of tubes (ETT, trach, NG, etc.); Respiratory or GI disease Recommendations/Plan: Swallowing Evaluation Recommendations Swallowing Evaluation Recommendations Recommendations: PO diet PO Diet Recommendation: Full liquid diet Liquid Administration via: Cup; Straw Medication Administration: Via alternative means Supervision: Full assist for feeding Swallowing strategies  : Slow rate; Small bites/sips; Follow solids with liquids Postural changes: Position pt fully upright for meals; Stay upright 30-60 min after meals Oral care recommendations: Oral care BID (2x/day) Treatment Plan Treatment Plan Treatment recommendations: Therapy as outlined in treatment plan below Follow-up recommendations: Skilled nursing-short term rehab (<3 hours/day) Functional status assessment: Patient has had a recent decline in their functional status and demonstrates the ability to make significant improvements in function in a reasonable and predictable amount of time. Treatment frequency: Min 2x/week Treatment duration: 2 weeks Interventions: Diet toleration management by SLP; Patient/family education Recommendations Recommendations for follow up therapy are one component of a multi-disciplinary discharge planning process, led by the attending physician.  Recommendations may be updated based on patient status, additional functional criteria and insurance authorization. Assessment: Orofacial Exam: Orofacial Exam Oral Cavity: Oral Hygiene: WFL Oral Cavity - Dentition: Dentures, top; Dentures, bottom Orofacial Anatomy: WFL Oral Motor/Sensory Function: WFL Anatomy: Anatomy: WFL Boluses Administered: Boluses Administered Boluses  Administered: Thin liquids (Level  0); Mildly thick liquids (Level 2, nectar thick); Puree  Oral Impairment Domain: Oral Impairment Domain Lip Closure: Escape progressing to mid-chin Tongue control during bolus hold: Escape to lateral buccal cavity/floor of mouth Bolus preparation/mastication: Slow prolonged chewing/mashing with complete recollection Bolus transport/lingual motion: Delayed initiation of tongue motion (oral holding) Oral residue: Residue collection on oral structures Location of oral residue : Tongue; Lateral sulci Initiation of pharyngeal swallow : Posterior angle of the ramus  Pharyngeal Impairment Domain: Pharyngeal Impairment Domain Soft palate elevation: No bolus between soft palate (SP)/pharyngeal wall (PW) Laryngeal elevation: Complete superior movement of thyroid cartilage with complete approximation of arytenoids to epiglottic petiole Anterior hyoid excursion: Partial anterior movement Epiglottic movement: Complete inversion Laryngeal vestibule closure: Incomplete, narrow column air/contrast in laryngeal vestibule Pharyngeal stripping wave : Present - complete Pharyngeal contraction (A/P view only): N/A Pharyngoesophageal segment opening: Complete distension and complete duration, no obstruction of flow Tongue base retraction: Trace column of contrast or air between tongue base and PPW Pharyngeal residue: Complete pharyngeal clearance  Esophageal Impairment Domain: Esophageal Impairment Domain Esophageal clearance upright position: Esophageal retention with retrograde flow through the PES Pill: Pill Consistency administered: -- (not tested) Penetration/Aspiration Scale Score: Penetration/Aspiration Scale Score 1.  Material does not enter airway: Mildly thick liquids (Level 2, nectar thick); Puree 3.  Material enters airway, remains ABOVE vocal cords and not ejected out: Thin liquids (Level 0) Compensatory Strategies: Compensatory Strategies Compensatory strategies: Yes Straw: Effective    General Information: Caregiver present: Yes  Diet Prior to this Study: NPO   Temperature : Normal   No data recorded  Supplemental O2: Nasal cannula   History of Recent Intubation: No  Behavior/Cognition: Alert; Cooperative; Pleasant mood Self-Feeding Abilities: Able to self-feed Baseline vocal quality/speech: Normal Volitional Cough: Able to elicit Volitional Swallow: Able to elicit Exam Limitations: Limited visibility Goal Planning: No data recorded No data recorded No data recorded No data recorded Consulted and agree with results and recommendations: Family member/caregiver; Nurse; Physician Pain: Pain Assessment Pain Assessment: No/denies pain Faces Pain Scale: 4 Pain Location: lower back; R knee with ROM Pain Descriptors / Indicators: Grimacing; Sore Pain Intervention(s): Limited activity within patient's tolerance; Monitored during session; Repositioned End of Session: Start Time:SLP Start Time (ACUTE ONLY): 1205 Stop Time: SLP Stop Time (ACUTE ONLY): 1244 Time Calculation:SLP Time Calculation (min) (ACUTE ONLY): 39 min Charges: SLP Evaluations $ SLP Speech Visit: 1 Visit SLP Evaluations $MBS Swallow: 1 Procedure $Swallowing Treatment: 1 Procedure SLP visit diagnosis: SLP Visit Diagnosis: Dysphagia, pharyngoesophageal phase (R13.14) Past Medical History: Past Medical History: Diagnosis Date  Acute renal failure (ARF) (HCC) 05/31/2019  B12 deficiency 08/02/2019  Bilateral carotid artery stenosis 05/31/2021  CAD (coronary artery disease) 2014  60-70% calcified LM lesion, high grade OM lesion, treated medically.  Chronic kidney disease, stage IV (severe) (HCC) 07/19/2019  Coronary artery disease involving native coronary artery of native heart without angina pectoris 07/19/2019  DM (diabetes mellitus) (HCC)   no longer on medications  Dyslipidemia 11/22/2019  Essential hypertension 07/19/2019  Gout   Gouty arthritis 09/06/2019  HTN (hypertension)   MSSA (methicillin susceptible Staphylococcus aureus) septicemia  (HCC) 06/09/2012  Nonrheumatic aortic valve insufficiency 07/19/2019  OSA (obstructive sleep apnea) 04/19/2013  Dr Jerre Simon    PAF (paroxysmal atrial fibrillation) (HCC) 06/03/2019  during admission for DRESS  Septic shock (HCC) 05/31/2019  Shock (HCC) 05/31/2019 Past Surgical History: Past Surgical History: Procedure Laterality Date  CARDIAC CATHETERIZATION  2014  CARDIOVERSION N/A 06/07/2019  Procedure: CARDIOVERSION;  Surgeon: Little Ishikawa,  MD;  Location: [facility] ENDOSCOPY;  Service: Cardiovascular;  Laterality: N/A;  TEE WITHOUT CARDIOVERSION N/A 06/07/2019  Procedure: TRANSESOPHAGEAL ECHOCARDIOGRAM (TEE);  Surgeon: Little Ishikawa, MD;  Location: [facility] ENDOSCOPY;  Service: Cardiovascular;  Laterality: N/A; Jill Side. Samson Frederic, MA CCC/SLP Clinical Specialist - Acute Care SLP Acute Rehabilitation Services Office number [phone] [name] 04/15/2023, 2:01 PM  DG Chest 1 View Result Date: 04/14/2023 CLINICAL DATA:  Cough and shortness of breath EXAM: PORTABLE CHEST 1 VIEW COMPARISON:  Film from earlier in the same day. FINDINGS: Cardiac shadow is enlarged. Feeding catheter is noted extending into the stomach. Left jugular temporary dialysis catheter is noted. Bilateral pleural effusions are noted right greater than left. No new focal infiltrate or effusion is seen. No bony abnormality is noted. IMPRESSION: Effusions right greater than left stable from the prior exam. Tubes and lines as described. Electronically Signed   By: Alcide Clever M.D.   On: 04/14/2023 00:00   CT HEAD WO CONTRAST ( ) Result Date: 04/13/2023 CLINICAL DATA:  Mental status change, unknown cause EXAM: CT HEAD WITHOUT CONTRAST TECHNIQUE: Contiguous axial images were obtained from the base of the skull through the vertex without intravenous contrast. RADIATION DOSE REDUCTION: This exam was performed according to the departmental dose-optimization program which includes automated exposure control, adjustment of the mA  and/or kV according to patient size and/or use of iterative reconstruction technique. COMPARISON:  None Available. FINDINGS: Motion limited study.  Within this limitation: Brain: No evidence of acute infarction, hemorrhage, hydrocephalus, extra-axial collection or mass lesion/mass effect. Cerebral atrophy. Vascular: No hyperdense vessel. Skull: No acute fracture. Sinuses/Orbits: Left maxillary sinus, anterior left ethmoid air cell, and left frontal sinus mucosal thickening. No acute orbital findings. Other: No mastoid effusions. IMPRESSION: No evidence of acute intracranial abnormality on motion limited assessment. Electronically Signed   By: Feliberto Harts M.D.   On: 04/13/2023 21:11   DG Chest 1 View Result Date: 04/13/2023 CLINICAL DATA:  Shortness of breath, tachycardia EXAM: CHEST  1 VIEW COMPARISON:  04/08/2023 FINDINGS: Left dialysis catheter and feeding tube remain in place, unchanged. Cardiomegaly, vascular congestion. Bilateral pleural effusions, right greater than left. Bibasilar airspace opacities, favor atelectasis. No real change since prior study. IMPRESSION: Cardiomegaly, vascular congestion. Bilateral pleural effusions, right greater than left with bibasilar atelectasis. Electronically Signed   By: Charlett Nose M.D.   On: 04/13/2023 10:26   DG CHEST PORT 1 VIEW Result Date: 04/08/2023 CLINICAL DATA:  Post central line placement. EXAM: PORTABLE CHEST 1 VIEW COMPARISON:  Earlier same day; chest CT-earlier same day FINDINGS: Unchanged enlarged cardiac silhouette and mediastinal contours with atherosclerotic plaque within the thoracic aorta. Interval placement left jugular approach central venous catheter with tip projected over the mid SVC. No definite left-sided pneumothorax, though evaluation degraded secondary to patient's overlying chin. Enteric tube tip below the left hemidiaphragm. Unchanged moderate-sized partially loculated right-sided effusion with associated right mid and lower lung  heterogeneous/consolidative opacities. Known right-sided hydropneumothorax demonstrated on preceding chest CT is not well demonstrated on this portable examination. Unchanged trace left-sided effusion associated left basilar opacities. No evidence of edema. No acute osseous abnormalities. Atherosclerotic plaque overlies the left axilla. IMPRESSION: 1. Interval placement of left jugular approach central venous catheter with tip projected over the mid SVC. No definite pneumothorax. 2. Unchanged moderate-sized partially loculated right-sided effusion with associated right mid and lower lung heterogeneous/consolidative opacities. Known right-sided hydropneumothorax, demonstrated on preceding chest CT, is not well demonstrated on the present examination. Electronically Signed   By: Jonny Ruiz  Watts M.D.   On: 04/08/2023 15:15   DG ESOPHAGUS W SINGLE CM (SOL OR THIN BA) Result Date: 04/08/2023 CLINICAL DATA:  Oropharyngeal dysphagia. Concern for esophageal dysmotility seen on modified barium swallow. EXAM: ESOPHAGUS/BARIUM SWALLOW/TABLET STUDY TECHNIQUE: Single contrast examination was performed using thin liquid barium. This exam was performed by Corrin Parker, PA-C, and was supervised and interpreted by Dr. Simonne Come. FLUOROSCOPY: Radiation Exposure Index (as provided by the fluoroscopic device): 25.6 mGy Kerma COMPARISON:  Modified barium swallow done April 07, 2023 FINDINGS: Swallowing: Appears normal. No vestibular penetration or aspiration seen. Esophagus: No mucosal lesions. Apparent mild tapered narrowing of the distal esophagus potentially suggestive of a nonocclusive stricture. Esophageal motility: Mild-to-moderate esophageal dysmotility with tertiary contractions and lack of dominant peristaltic stripping wave. Hiatal Hernia: No hiatal hernia seen. Gastroesophageal reflux: Unable to fully assess gastroesophageal reflux secondary to patient's immobility and limited study. Ingested 13 mm barium tablet: Patient was  unable to swallow barium tablet. Other: Study significantly limited secondary to patient's immobility. IMPRESSION: 1. Mild-to-moderate esophageal dysmotility. 2. Mild tapered narrowing of the distal esophagus without occlusion. Further evaluation and management with endoscopy could be performed as indicated. Electronically Signed   By: Simonne Come M.D.   On: 04/08/2023 15:00   CT CHEST ABDOMEN PELVIS W CONTRAST Result Date: 04/08/2023 CLINICAL DATA:  83 year old male with COPD exacerbation, pain. History of empyema with right lung drain recently. EXAM: CT CHEST, ABDOMEN, AND PELVIS WITH CONTRAST TECHNIQUE: Multidetector CT imaging of the chest, abdomen and pelvis was performed following the standard protocol during bolus administration of intravenous contrast. RADIATION DOSE REDUCTION: This exam was performed according to the departmental dose-optimization program which includes automated exposure control, adjustment of the mA and/or kV according to patient size and/or use of iterative reconstruction technique. CONTRAST:  75mL OMNIPAQUE IOHEXOL 350 MG/ML SOLN COMPARISON:  Portable chest this morning.  Chest CT 04/03/2023. FINDINGS: CT CHEST FINDINGS Cardiovascular: Extensive Calcified aortic atherosclerosis. Stable borderline to mild cardiomegaly. No pericardial effusion. Mediastinum/Nodes: Enteric tube within the esophagus with mild retained food or secretions proximally, but elsewhere the thoracic esophagus is decompressed. No mediastinal mass or lymphadenopathy. Lungs/Pleura: Trace layering left pleural effusion. Respiratory motion. Left lung otherwise grossly negative. Major airways are patent. Complete right lung loculated right hydropneumothorax with superimposed masslike and enhancing 6 cm right lower lobe lesion on series 3, image 39. this corresponds to the same suspicious area on the previous noncontrast CT. Adjacent pleural thickening, loculated fluid and gas (series 3, image 44). Air-fluid level with  pleural thickening in the right lung apex. Occasional other gas loculations. The percutaneous drain has been removed. The pleural fluid component has increased from the previous CT, but otherwise the lung configuration is not significantly changed. Musculoskeletal: Rib motion artifact. Otherwise stable. No acute or suspicious osseous lesion identified. CT ABDOMEN PELVIS FINDINGS Hepatobiliary: Liver remains within normal limits subjacent to elevated hemidiaphragm. Gallbladder is contracted. Pancreas: Negative. Spleen: Negative. Adrenals/Urinary Tract: Negative adrenal glands. Decompressed renal collecting system, ureters, and bladder. Symmetric renal enhancement but no renal contrast excretion on the delayed images. Stomach/Bowel: Dense oral contrast intermittently in the bowel. Enteric feeding tube terminates in the proximal gastric body on series 3, image 46. The stomach is moderately distended with fluid. The duodenum is decompressed. Motion artifact elsewhere in the abdomen and pelvis. No dilated small bowel. Cecum is on a lax mesentery. Distal large bowel diverticulosis. No bowel wall thickening, free air or free fluid identified. Vascular/Lymphatic: Aortoiliac calcified atherosclerosis. Grossly patent major arterial structures, motion  artifact which also degrades evaluation of the portal venous system. No lymphadenopathy. Reproductive: Negative. Other: No pelvis free fluid. Bilateral flank and hip body wall, subcutaneous edema which is asymmetric, somewhat greater on the right. Musculoskeletal: Spine and hip degeneration. No acute or suspicious osseous lesion. IMPRESSION: 1. Recent right chest tube removed. No improvement in right lung empyema / loculated hydropneumothorax, and ongoing roughly 6 cm superimposed Mass-like area in the Right Lower Lobe which is enhancing following contrast. Right lung ventilation not significantly changed from 04/03/2023. Trace layering left pleural effusion. 2. No other acute  finding in the Chest. 3. Stomach moderately distended with fluid and oral contrast. But otherwise decompressed bowel in the abdomen and pelvis. Enteric feeding tube terminates at the proximal gastric body. 4.  Aortic Atherosclerosis (ICD10-I70.0). Electronically Signed   By: Odessa Fleming M.D.   On: 04/08/2023 11:45   DG Chest Port 1 View Result Date: 04/08/2023 CLINICAL DATA:  Loculated empyema EXAM: PORTABLE CHEST 1 VIEW COMPARISON:  04/06/2023 FINDINGS: Rotated AP portable examination. Previously seen large-bore right neck multi lumen vascular catheter removed. Non weighted enteric feeding tube remains with tip in the gastric fundus. Unchanged moderate, loculated right pleural effusion and small, layering left pleural effusion. No new airspace opacity. Cardiomegaly. No acute osseous findings. IMPRESSION: 1. Unchanged moderate, loculated right pleural effusion and small, layering left pleural effusion. No new airspace opacity. 2. Previously seen large-bore right neck multi lumen vascular catheter removed. Non weighted enteric feeding tube remains with tip in the gastric fundus. Electronically Signed   By: Jearld Lesch M.D.   On: 04/08/2023 10:16   DG Swallowing Func-Speech Pathology Result Date: 04/07/2023 Table formatting from the original result was not included. Modified Barium Swallow Study Patient Details Name: Hadyn Blanck MRN: [id number] Date of Birth: Aug 19, 1940 Today's Date: 04/07/2023 HPI/PMH: HPI: Mr. Posten is an 83 year old gentleman admitted 1/14 presenting with greater than 2 months of progressive shortness of breath and leg edema. Over the last 2 weeks he has felt worse such that he is short of breath even at rest  He has been sleeping sitting upright sometimes in his car to prop him up enough. Pt also with afib. Pt with chest tube placed 1/14 due to pleural effusion. Pt  found to have AKI d/t ischemic ATN r/t septic shock with cultures growing staph aureus at chest tube 1/16. Pt transferred to ICU and  CRRT initiated 1/16. PMH: A-fib many years ago, status post ablation and not recurrent so not on anticoagulation, CKD 3B Clinical Impression: Clinical Impression: Patient presents with a mild oropharyngeal dysphagia. Oral phase marked by mild oral transit delays and mild oral residuals, both likely due to increased WOB at baseline impacting coordination of oral phase. Swallow initiation largely with occassional iniitation at the level of the vallecula. Primary deficit occuring at laryngeal vestibule with decreased closure of unknown definitive origin as hyolaryngeal movement appears WFL. This resulted in aspiration of thin liquid with subtle, ineffective throat clear in response. Cue for strong cough successful to clear and use of chin tuck in combination with small sips via cup or straw effective to prevent for the remainder of the study. Mild oral residuals cleared with spontaneous dry swallow. Esophageal sweep did reveal what appeared to be a moderate degree of post prandial residuals, not clearing with liquid wash. This could account for a decrease in supraglottic pressure and some decrease in laryngeal closure, as well as contribute to post prandial coughing (reflux cough) noted by RN at bedside and confirmed  by this SLP during todays study in which patient with strong cough post study wtihout observation of penetration or aspiration. Further, note that view somewhat obstructed by wide shoulders and decreased ability to hold himself strongly upright. Recommend diet modification and  SLP f/u for compensatory strategy training. With decreased WOB, swallowing function may improve. Factors that may increase risk of adverse event in presence of aspiration [name] [employer] Clearance Coots 2021): Factors that may increase risk of adverse event in presence of aspiration [name] [employer] Clearance Coots 2021): Respiratory or GI disease; Poor general health and/or compromised immunity; Limited mobility Recommendations/Plan: Swallowing Evaluation  Recommendations Swallowing Evaluation Recommendations Recommendations: PO diet PO Diet Recommendation: Dysphagia 3 (Mechanical soft); Thin liquids (Level 0) Liquid Administration via: Cup; Straw Medication Administration: Whole meds with puree Supervision: Patient able to self-feed; Full assist for feeding Swallowing strategies  : Follow solids with liquids; Chin tuck Postural changes: Stay upright 30-60 min after meals; Position pt fully upright for meals Oral care recommendations: Oral care BID (2x/day) Recommended consults: Consider esophageal assessment Treatment Plan Treatment Plan Treatment recommendations: Therapy as outlined in treatment plan below Follow-up recommendations: Other (comment) Recommendations Comment: TBD Functional status assessment: Patient has had a recent decline in their functional status and demonstrates the ability to make significant improvements in function in a reasonable and predictable amount of time. Treatment frequency: Min 2x/week Treatment duration: 2 weeks Interventions: Aspiration precaution training; Compensatory techniques; Patient/family education; Trials of upgraded texture/liquids; Diet toleration management by SLP Recommendations Recommendations for follow up therapy are one component of a multi-disciplinary discharge planning process, led by the attending physician.  Recommendations may be updated based on patient status, additional functional criteria and insurance authorization. Assessment: Orofacial Exam: Orofacial Exam Oral Cavity: Oral Hygiene: WFL Oral Cavity - Dentition: Dentures, top; Dentures, bottom Orofacial Anatomy: WFL Oral Motor/Sensory Function: WFL Anatomy: Anatomy: WFL Boluses Administered: Boluses Administered Boluses Administered: Thin liquids (Level 0); Mildly thick liquids (Level 2, nectar thick); Moderately thick liquids (Level 3, honey thick); Puree; Solid  Oral Impairment Domain: Oral Impairment Domain Lip Closure: Escape progressing to mid-chin  Tongue control during bolus hold: Escape to lateral buccal cavity/floor of mouth Bolus preparation/mastication: Slow prolonged chewing/mashing with complete recollection Bolus transport/lingual motion: Delayed initiation of tongue motion (oral holding) Oral residue: Residue collection on oral structures Location of oral residue : Tongue; Lateral sulci Initiation of pharyngeal swallow : Posterior angle of the ramus  Pharyngeal Impairment Domain: Pharyngeal Impairment Domain Soft palate elevation: No bolus between soft palate (SP)/pharyngeal wall (PW) Laryngeal elevation: Complete superior movement of thyroid cartilage with complete approximation of arytenoids to epiglottic petiole Anterior hyoid excursion: Partial anterior movement Epiglottic movement: Complete inversion Laryngeal vestibule closure: Incomplete, narrow column air/contrast in laryngeal vestibule Pharyngeal stripping wave : -- (difficult to view) Pharyngeal contraction (A/P view only): N/A Pharyngoesophageal segment opening: Complete distension and complete duration, no obstruction of flow Tongue base retraction: Trace column of contrast or air between tongue base and PPW Pharyngeal residue: Complete pharyngeal clearance  Esophageal Impairment Domain: Esophageal Impairment Domain Esophageal clearance upright position: Esophageal retention Pill: Pill Consistency administered: Puree Puree: WFL Penetration/Aspiration Scale Score: Penetration/Aspiration Scale Score 1.  Material does not enter airway: Mildly thick liquids (Level 2, nectar thick); Moderately thick liquids (Level 3, honey thick); Puree; Solid; Pill 7.  Material enters airway, passes BELOW cords and not ejected out despite cough attempt by patient: Thin liquids (Level 0) Compensatory Strategies: Compensatory Strategies Compensatory strategies: Yes Chin tuck: Effective Effective Chin Tuck: Thin liquid (Level 0)  General Information: Caregiver present: No  Diet Prior to this Study: Regular; Thin  liquids (Level 0)   Temperature : Normal   Respiratory Status: Increased WOB   Supplemental O2: Nasal cannula   History of Recent Intubation: No  Behavior/Cognition: Alert; Cooperative; Pleasant mood Self-Feeding Abilities: Able to self-feed Baseline vocal quality/speech: Normal Volitional Cough: Able to elicit Volitional Swallow: Able to elicit Exam Limitations: Limited visibility; Poor positioning Goal Planning: Prognosis for improved oropharyngeal function: Fair No data recorded No data recorded No data recorded Consulted and agree with results and recommendations: Patient; Nurse Pain: Pain Assessment Pain Assessment: No/denies pain Faces Pain Scale: 2 Facial Expression: 0 Body Movements: 0 Muscle Tension: 0 Compliance with ventilator (intubated pts.): N/A Vocalization (extubated pts.): 0 CPOT Total: 0 Pain Location: bottom Pain Descriptors / Indicators: Discomfort; Grimacing; Sore Pain Intervention(s): Limited activity within patient's tolerance; Monitored during session End of Session: Start Time:SLP Start Time (ACUTE ONLY): 1230 Stop Time: SLP Stop Time (ACUTE ONLY): 1252 Time Calculation:SLP Time Calculation (min) (ACUTE ONLY): 22 min Charges: SLP Evaluations $ SLP Speech Visit: 1 Visit SLP Evaluations $MBS Swallow: 1 Procedure SLP visit diagnosis: SLP Visit Diagnosis: Dysphagia, unspecified (R13.10) Past Medical History: Past Medical History: Diagnosis Date  Acute renal failure (ARF) (HCC) 05/31/2019  B12 deficiency 08/02/2019  Bilateral carotid artery stenosis 05/31/2021  CAD (coronary artery disease) 2014  60-70% calcified LM lesion, high grade OM lesion, treated medically.  Chronic kidney disease, stage IV (severe) (HCC) 07/19/2019  Coronary artery disease involving native coronary artery of native heart without angina pectoris 07/19/2019  DM (diabetes mellitus) (HCC)   no longer on medications  Dyslipidemia 11/22/2019  Essential hypertension 07/19/2019  Gout   Gouty arthritis 09/06/2019  HTN  (hypertension)   MSSA (methicillin susceptible Staphylococcus aureus) septicemia (HCC) 06/09/2012  Nonrheumatic aortic valve insufficiency 07/19/2019  OSA (obstructive sleep apnea) 04/19/2013  Dr Jerre Simon    PAF (paroxysmal atrial fibrillation) (HCC) 06/03/2019  during admission for DRESS  Shock (HCC) 05/31/2019 Past Surgical History: Past Surgical History: Procedure Laterality Date  CARDIAC CATHETERIZATION  2014  CARDIOVERSION N/A 06/07/2019  Procedure: CARDIOVERSION;  Surgeon: Little Ishikawa, MD;  Location: [facility] ENDOSCOPY;  Service: Cardiovascular;  Laterality: N/A;  TEE WITHOUT CARDIOVERSION N/A 06/07/2019  Procedure: TRANSESOPHAGEAL ECHOCARDIOGRAM (TEE);  Surgeon: Little Ishikawa, MD;  Location: [facility] ENDOSCOPY;  Service: Cardiovascular;  Laterality: N/A; Ferdinand Lango MA, CCC-SLP McCoy Leah Meryl 04/07/2023, 1:28 PM  DG Chest Port 1 View Result Date: 04/06/2023 CLINICAL DATA:  Shortness of breath EXAM: PORTABLE CHEST 1 VIEW COMPARISON:  04/05/2023 FINDINGS: Right IJ central line tip: SVC. Feeding tube extends into the stomach. Atherosclerotic calcification of the aortic arch. Mild to moderate enlargement of the cardiopericardial silhouette. Blunting of both costophrenic angles compatible with pleural effusions, right greater than left. Associated presumed passive atelectasis,, mildly increased on the right compared to the prior exam. The gas component of the right hydropneumothorax is slightly less apparent on today's exam although still present. IMPRESSION: 1. The gas component of the right hydropneumothorax is slightly less apparent on today's exam although still present. 2. Bilateral pleural effusions, right greater than left. Associated presumed passive atelectasis, mildly increased on the right compared to the prior exam. 3. Mild to moderate enlargement of the cardiopericardial silhouette. 4. Aortic Atherosclerosis (ICD10-I70.0). Electronically Signed   By: Gaylyn Rong M.D.   On: 04/06/2023  13:31   DG Abd 1 View Result Date: 04/06/2023 CLINICAL DATA:  Shortness of breath, feeding tube EXAM: ABDOMEN - 1 VIEW COMPARISON:  04/01/2023 FINDINGS: The feeding tube terminates in the stomach fundus region. Splenic artery calcification noted. Lumbar spondylosis. Unremarkable bowel gas pattern. Suspected basilar pleural effusions. IMPRESSION: 1. The feeding tube terminates in the stomach fundus region. 2. Suspected basilar pleural effusions. Electronically Signed   By: Gaylyn Rong M.D.   On: 04/06/2023 13:28   DG Chest Port 1 View Result Date: 04/05/2023 CLINICAL DATA:  Evaluate for hydropneumothorax. EXAM: PORTABLE CHEST 1 VIEW COMPARISON:  Chest radiograph dated 04/04/2023. FINDINGS: Right IJ central venous line and feeding tube are in similar position. Interval removal of the right chest tube. No significant interval change in the size of the right hydropneumothorax. Small left pleural effusion. Stable cardiomediastinal silhouette. No acute osseous pathology. IMPRESSION: Interval removal of the right chest tube. No significant interval change in the size of the right hydropneumothorax. Electronically Signed   By: Elgie Collard M.D.   On: 04/05/2023 09:42   DG Chest Port 1 View Result Date: 04/04/2023 CLINICAL DATA:  Chest tube in place.  Follow up pneumothorax. EXAM: PORTABLE CHEST 1 VIEW COMPARISON:  Radiographs 04/03/2023 and 04/01/2023.  CT 04/03/2023. FINDINGS: 0531 hours. Right IJ central venous catheter is unchanged, projecting to the lower SVC level. Feeding tube projects below the diaphragm. Small caliber pigtail pleural catheter on the right is incompletely formed, although unchanged in position. Unchanged loculated hydropneumothorax on the right with apical and basilar components. There is partial collapse of the right lung with stable right basilar opacity. Stable mild left basilar atelectasis and small left pleural effusion. The heart size and mediastinal contours are stable.  IMPRESSION: 1. Unchanged loculated hydropneumothorax on the right with apical and basilar components. Stable right basilar opacity. 2. Stable mild left basilar atelectasis and small left pleural effusion. 3. Stable support system. Electronically Signed   By: Carey Bullocks M.D.   On: 04/04/2023 09:51   CT CHEST WO CONTRAST Result Date: 04/03/2023 CLINICAL DATA:  Empyema evaluation. EXAM: CT CHEST WITHOUT CONTRAST TECHNIQUE: Multidetector CT imaging of the chest was performed following the standard protocol without IV contrast. RADIATION DOSE REDUCTION: This exam was performed according to the departmental dose-optimization program which includes automated exposure control, adjustment of the mA and/or kV according to patient size and/or use of iterative reconstruction technique. COMPARISON:  CT 03/29/2023, chest x-ray 04/01/2023 and 04/03/2023 FINDINGS: Cardiovascular: Stable cardiomegaly. Right IJ central venous catheter has tip over the SVC. Calcified plaque over the left main, left anterior descending and right coronary arteries. Thoracic aorta is normal in caliber. Mild calcified plaque over the descending thoracic aorta. Remaining vascular structures are unchanged. Mediastinum/Nodes: No significant mediastinal or hilar adenopathy. Remaining mediastinal structures are unremarkable. Stable 2.3 cm right thyroid nodule. Lungs/Pleura: Interval placement of right basilar pigtail drainage catheter. Significant interval decrease in size of the right pleural fluid collection with persistent small stable right apical hydropneumothorax. Small right lateral component of the right-sided hydropneumothorax present. Mild atelectasis over the right middle and upper lobes. Persistent consolidation over the right lower lobe with somewhat masslike appearance measuring 3.7 x 6 cm which is unchanged. This may represent rounded atelectasis, although underlying mass is possible. Left lung demonstrates slight interval worsening of a  small pleural effusion and is otherwise clear. Mild dependent debris along the right lateral wall of the trachea likely aspirate material. Upper Abdomen: Calcified plaque over the abdominal aorta. Nasogastric tube is present with tip over the distal stomach. Gallbladder is contracted. No acute findings. Musculoskeletal: Unchanged. IMPRESSION: 1. Interval placement of right basilar pigtail drainage catheter  with significant interval decrease in size of the right pleural fluid collection with persistent stable right hydropneumothorax. 2. Persistent consolidation over the right lower lobe with somewhat masslike appearance measuring 3.7 x 6 cm which is unchanged. This may represent rounded atelectasis, although underlying mass is possible. Recommend continued follow-up to resolution. 3. Slight interval worsening of small left pleural effusion. 4. Stable 2.3 cm right thyroid nodule. Recommend ultrasound evaluation on elective basis as suggested previously. 5. Stable cardiomegaly. Aortic atherosclerosis. Atherosclerotic coronary artery disease. Aortic Atherosclerosis (ICD10-I70.0). Electronically Signed   By: Elberta Fortis M.D.   On: 04/03/2023 12:24   DG Chest Port 1 View Result Date: 04/03/2023 CLINICAL DATA:  83 year old male with history of chest tube. EXAM: PORTABLE CHEST 1 VIEW COMPARISON:  Chest x-ray 04/01/2023. FINDINGS: Small bore right-sided chest tube noted with tip partially reformed projecting over the lower aspect of the right hemithorax. Right internal jugular Cordis with tip projecting over the expected location of the distal superior vena cava. Small bore feeding tube extending into the stomach. Lung volumes are low. Loculated right pleural effusion moderate in size again noted. Small amount of loculated pneumothorax in the upper right hemithorax, similar to the recent prior study. Interstitial prominence an ill-defined opacities throughout the right lung which may reflect areas of atelectasis and/or  consolidation. Left lung is clear. No definite left pleural effusion. No left pneumothorax. No evidence of pulmonary edema. Heart size is mildly enlarged. The patient is rotated to the left on today's exam, resulting in distortion of the mediastinal contours and reduced diagnostic sensitivity and specificity for mediastinal pathology. Atherosclerotic calcifications in the thoracic aorta. IMPRESSION: 1. Support apparatus, as above. 2. Loculated right-sided hydropneumothorax again noted, overall unchanged. Areas of atelectasis and/or consolidation in the underlying right lung also similar to the prior study. 3. Mild cardiomegaly. 4. Aortic atherosclerosis. Electronically Signed   By: Trudie Reed M.D.   On: 04/03/2023 09:15   DG Abd Portable 1V Result Date: 04/01/2023 CLINICAL DATA:  [id number] Encounter for feeding tube placement [id number] EXAM: PORTABLE ABDOMEN - 1 VIEW COMPARISON:  None Available. FINDINGS: Limited radiograph of the lower chest and upper abdomen was obtained for the purposes of enteric tube localization. Enteric tube is seen coursing below the diaphragm with distal tipterminating within the expected location of the gastric body or fundus. A right basilar chest tube is evident. IMPRESSION: Enteric tube terminates within the expected location of the gastric body or fundus. Electronically Signed   By: Duanne Guess D.O.   On: 04/01/2023 16:12   DG Chest Port 1 View Result Date: 04/01/2023 CLINICAL DATA:  Chest tube in place. EXAM: PORTABLE CHEST 1 VIEW COMPARISON:  March 31, 2023. FINDINGS: Stable cardiomediastinal silhouette. Right internal jugular catheter is unchanged. Right-sided chest tube is noted. Stable right pleural effusion is noted with associated atelectasis. Probable minimal left basilar atelectasis is noted. Bony thorax is unremarkable. IMPRESSION: Stable right-sided chest tube with stable right pleural effusion and associated right basilar atelectasis. Electronically Signed    By: Lupita Raider M.D.   On: 04/01/2023 11:57   MR LUMBAR SPINE WO CONTRAST Result Date: 03/31/2023 CLINICAL DATA:  Low back pain, infection suspected, no prior imaging EXAM: MRI LUMBAR SPINE WITHOUT CONTRAST TECHNIQUE: Multiplanar, multisequence MR imaging of the lumbar spine was performed. No intravenous contrast was administered. COMPARISON:  None Available. FINDINGS: Segmentation: Transitional lumbosacral anatomy with partially lumbarized S1 segment. Alignment: Slight (grade 1) stepwise retrolisthesis of L2 on L3 and L3 on L4. Otherwise, no substantial  sagittal subluxation. Vertebrae: Mild degenerative/discogenic endplate signal changes at L2-L3. No specific evidence of discitis/osteomyelitis or suspicious bone lesion. Conus medullaris and cauda equina: Conus extends to the T12-L1 level. Conus and cauda equina appear normal. Paraspinal and other soft tissues: Unremarkable. Disc levels: T12-L1: No significant disc protrusion, foraminal stenosis, or canal stenosis. L1-L2: Mild disc bulge.  No significant stenosis. L2-L3: Mild disc bulge.  No significant stenosis. L3-L4: Mild disc bulge. Bilateral facet arthropathy. No significant stenosis. L4-L5: Mild disc bulge and bilateral facet arthropathy. Resulting mild left foraminal stenosis. Patent canal and right foramen. L5-S1: Mild disc bulging. Bilateral facet arthropathy. No significant stenosis. S1-S2: Transitional anatomy. Right eccentric endplate spurring without significant stenosis. IMPRESSION: 1. No specific evidence of discitis/osteomyelitis on noncontrast imaging. 2. At L4-L5, mild foraminal stenosis. Otherwise, lower lumbar degenerative change without significant stenosis. 3. Transitional lumbosacral anatomy with partially lumbarized S1 vertebral body. Correlation with radiographs is recommended prior to any operative intervention. Electronically Signed   By: Feliberto Harts M.D.   On: 03/31/2023 20:00   DG CHEST PORT 1 VIEW Result Date:  03/31/2023 CLINICAL DATA:  Chest tube in place. EXAM: PORTABLE CHEST 1 VIEW COMPARISON:  Chest radiograph dated 03/30/2023. FINDINGS: Right IJ catheter and right chest tube in similar position. No significant interval change in right pleural effusion with associated right lung atelectasis. No pneumothorax. Stable cardiac silhouette. No acute osseous pathology. IMPRESSION: No significant interval change. Electronically Signed   By: Elgie Collard M.D.   On: 03/31/2023 09:43   DG CHEST PORT 1 VIEW Result Date: 03/30/2023 CLINICAL DATA:  Line placement EXAM: PORTABLE CHEST 1 VIEW COMPARISON:  X-ray 03/29/2023 and older FINDINGS: New right IJ line with the tip along the central SVC above the right atrium. No pneumothorax. Enlarged cardiopericardial silhouette again seen with a large right pleural effusion and small left. Pigtail catheter in the right lung base. Associated right lung opacities. No pneumothorax. No edema. Overlapping cardiac leads. Film is under penetrated IMPRESSION: New right IJ line in place.  No pneumothorax Electronically Signed   By: Karen Kays M.D.   On: 03/30/2023 18:12   ECHOCARDIOGRAM COMPLETE Result Date: 03/30/2023    ECHOCARDIOGRAM REPORT   Patient Name:   MICAJAH DENNIN Date of Exam: 03/30/2023 Medical Rec #:  [id number]    Height:       73.0 in Accession #:    [id number]   Weight:       208.3 lb Date of Birth:  1940/12/30     BSA:          2.189 m Patient Age:    82 years     BP:           90/62 mmHg Patient Gender: M            HR:           105 bpm. Exam Location:  Inpatient Procedure: 2D Echo, Cardiac Doppler, Color Doppler and Intracardiac            Opacification Agent Indications:    Dyspnea  History:        Patient has prior history of Echocardiogram examinations, most                 recent 06/07/2019. Arrythmias:Atrial Fibrillation; Risk                 Factors:Hypertension, Diabetes and Dyslipidemia.  Sonographer:    Karma Ganja Referring Phys: [id number] TODD D MCDIARMID   Sonographer Comments: Technically difficult study  due to poor echo windows, patient is obese and suboptimal subcostal window. IMPRESSIONS  1. Technically difficult study with poor windows and patient in Afib with RVR. Left ventricular ejection fraction, by estimation, is 50 to 55%. The left ventricle has low normal function. The left ventricle has no regional wall motion abnormalities. There is moderate left ventricular hypertrophy. Left ventricular diastolic parameters are indeterminate.  2. Right ventricular systolic function is moderately reduced. The right ventricular size is mildly enlarged.  3. Left atrial size was moderately dilated.  4. Right atrial size was mildly dilated.  5. The mitral valve is normal in structure. Trivial mitral valve regurgitation. No evidence of mitral stenosis.  6. The aortic valve was not well visualized. Aortic valve regurgitation is not visualized. No aortic stenosis is present. FINDINGS  Left Ventricle: Left ventricular ejection fraction, by estimation, is 50 to 55%. The left ventricle has low normal function. The left ventricle has no regional wall motion abnormalities. Definity contrast agent was given IV to delineate the left ventricular endocardial borders. The left ventricular internal cavity size was normal in size. There is moderate left ventricular hypertrophy. Left ventricular diastolic parameters are indeterminate. Right Ventricle: The right ventricular size is mildly enlarged. Right vetricular wall thickness was not well visualized. Right ventricular systolic function is moderately reduced. Left Atrium: Left atrial size was moderately dilated. Right Atrium: Right atrial size was mildly dilated. Pericardium: There is no evidence of pericardial effusion. Mitral Valve: The mitral valve is normal in structure. Trivial mitral valve regurgitation. No evidence of mitral valve stenosis. Tricuspid Valve: The tricuspid valve is normal in structure. Tricuspid valve regurgitation  is trivial. Aortic Valve: The aortic valve was not well visualized. Aortic valve regurgitation is not visualized. No aortic stenosis is present. Aortic valve mean gradient measures 2.0 mmHg. Aortic valve peak gradient measures 3.0 mmHg. Aortic valve area, by VTI measures 2.03 cm. Pulmonic Valve: The pulmonic valve was not well visualized. Pulmonic valve regurgitation is trivial. Aorta: The aortic root is normal in size and structure. IAS/Shunts: The interatrial septum was not well visualized.  LEFT VENTRICLE PLAX 2D LVIDd:         4.60 cm     Diastology LVIDs:         4.10 cm     LV e' medial:    6.02 cm/s LV PW:         1.60 cm     LV E/e' medial:  14.1 LV IVS:        1.60 cm     LV e' lateral:   11.97 cm/s LVOT diam:     2.10 cm     LV E/e' lateral: 7.1 LV SV:         28 LV SV Index:   13 LVOT Area:     3.46 cm  LV Volumes (MOD) LV vol d, MOD A2C: 44.5 ml LV vol d, MOD A4C: 86.9 ml LV vol s, MOD A2C: 13.0 ml LV vol s, MOD A4C: 24.3 ml LV SV MOD A2C:     31.5 ml LV SV MOD A4C:     86.9 ml LV SV MOD BP:      45.8 ml RIGHT VENTRICLE RV Basal diam:  4.90 cm RV S prime:     5.01 cm/s TAPSE (M-mode): 1.4 cm LEFT ATRIUM              Index        RIGHT ATRIUM  Index LA diam:        5.50 cm  2.51 cm/m   RA Area:     26.50 cm LA Vol (A2C):   101.0 ml 46.13 ml/m  RA Volume:   71.40 ml  32.61 ml/m LA Vol (A4C):   96.9 ml  44.26 ml/m LA Biplane Vol: 101.0 ml 46.13 ml/m  AORTIC VALVE AV Area (Vmax):    1.92 cm AV Area (Vmean):   1.87 cm AV Area (VTI):     2.03 cm AV Vmax:           87.30 cm/s AV Vmean:          59.900 cm/s AV VTI:            0.138 m AV Peak Grad:      3.0 mmHg AV Mean Grad:      2.0 mmHg LVOT Vmax:         48.43 cm/s LVOT Vmean:        32.300 cm/s LVOT VTI:          0.081 m LVOT/AV VTI ratio: 0.59  AORTA Ao Root diam: 3.50 cm MITRAL VALVE               TRICUSPID VALVE MV Area (PHT): 5.73 cm    TR Peak grad:   31.4 mmHg MV Decel Time: 132 msec    TR Vmax:        280.00 cm/s MV E velocity:  84.77 cm/s                            SHUNTS                            Systemic VTI:  0.08 m                            Systemic Diam: 2.10 cm Epifanio Lesches MD Electronically signed by Epifanio Lesches MD Signature Date/Time: 03/30/2023/10:55:09 AM    Final    DG Chest Port 1 View Result Date: 03/30/2023 CLINICAL DATA:  142230 Pleural effusion 142230 S/P right hemithorax opacification, status post right pleural drainage catheter placement. Evaluate pneumothorax EXAM: PORTABLE CHEST 1 VIEW COMPARISON:  Chest x-ray 03/29/2023, CT chest 03/29/2023 FINDINGS: The heart and mediastinal contours are unchanged. Atherosclerotic plaque No focal consolidation. No pulmonary edema. Similar-appearing right loculated large volume pleural effusion. Persistent trace left pleural effusion. No pneumothorax. No acute osseous abnormality. IMPRESSION: 1.  Similar-appearing right loculated large volume pleural effusion. 2. Persistent trace left pleural effusion. 3. No pneumothorax. 4.  Aortic Atherosclerosis (ICD10-I70.0). Electronically Signed   By: Tish Frederickson M.D.   On: 03/30/2023 01:06   US RENAL Result Date: 03/29/2023 CLINICAL DATA:  Oliguria EXAM: RENAL / URINARY TRACT ULTRASOUND COMPLETE COMPARISON:  06/03/2019 FINDINGS: Right Kidney: Renal measurements: 9.3 x 5.6 x 6.2 cm = volume: 168 mL. Echogenicity within normal limits. No mass or hydronephrosis visualized. Left Kidney: Renal measurements: 10.3 x 4.9 x 4.4 cm = volume: 118 mL. Poorly visible due to habitus and gas. No hydronephrosis or obvious mass. Possible mild renal cortical thinning. Bladder: Appears normal for degree of bladder distention. Other: None. IMPRESSION: Negative for hydronephrosis. Possible mild left renal cortical thinning but left kidney difficult to visualize due to habitus and bowel gas. Electronically Signed   By: Jasmine Pang M.D.   On: 03/29/2023  21:38   US Abdomen Limited RUQ (LIVER/GB) Result Date: 03/29/2023 CLINICAL DATA:   213305 Alcohol use 213305. EXAM: ULTRASOUND ABDOMEN LIMITED RIGHT UPPER QUADRANT COMPARISON:  None Available. FINDINGS: Examination is limited due to patient related factors. Gallbladder: No gallstones or wall thickening visualized. No sonographic Murphy sign noted by sonographer. Common bile duct: Diameter: Less than 4 mm.  No intrahepatic bile duct dilation. Liver: Not seen in the entirety. Left hepatic lobe is obscured by bowel gas. No focal lesion seen in the visualized portion. Within normal limits in parenchymal echogenicity. Portal vein is patent on color Doppler imaging with normal direction of blood flow towards the liver. Other: None. IMPRESSION: *Limited but grossly unremarkable exam. Electronically Signed   By: Jules Schick M.D.   On: 03/29/2023 18:27   DG Chest Port 1 View Result Date: 03/29/2023 CLINICAL DATA:  227212 S/P thoracostomy tube placement (HCC) [id number]. EXAM: PORTABLE CHEST 1 VIEW COMPARISON:  03/29/2023, 9:33 a.m. FINDINGS: Redemonstration of mild opacification of right hemithorax, mildly improved since the prior study. Findings may represent underlying pleural effusion and atelectasis with or without consolidation. There is new right-sided pleural drainage catheter. There is apparent lucency overlying the right upper lung zone, concerning for a small pneumothorax. Attention on follow-up examination is recommended. Left lung is grossly clear. Stable cardio-mediastinal silhouette. No acute osseous abnormalities. The soft tissues are within normal limits. IMPRESSION: *Mild interval improvement in the right hemithorax opacification, status post right pleural drainage catheter placement. There is apparent lucency overlying the right upper lung zone, concerning for a small pneumothorax. Attention on follow-up examination is recommended. Electronically Signed   By: Jules Schick M.D.   On: 03/29/2023 16:48   CT CHEST WO CONTRAST Result Date: 03/29/2023 CLINICAL DATA:  Respiratory illness.  EXAM: CT CHEST WITHOUT CONTRAST TECHNIQUE: Multidetector CT imaging of the chest was performed following the standard protocol without IV contrast. RADIATION DOSE REDUCTION: This exam was performed according to the departmental dose-optimization program which includes automated exposure control, adjustment of the mA and/or kV according to patient size and/or use of iterative reconstruction technique. COMPARISON:  X-ray earlier 03/29/2023. FINDINGS: Cardiovascular: Trace pericardial fluid or thickening. Heart slightly enlarged. Coronary artery calcifications are seen. Prominent epicardial fat. The thoracic aorta has a overall normal course and caliber with scattered diffuse calcified plaque. There is a bovine type aortic arch, normal variant. Significant stenosis suggested at the origin of the right subclavian artery and right common carotid artery additional evaluation with CTA can be performed when clinically appropriate. There is also significant plaque along the left common carotid artery origin. Mediastinum/Nodes: Slightly patulous thoracic esophagus with some luminal gas. Small thyroid gland with a cystic nodule involving the right thyroid lobe measuring 2.4 by 1.4 cm. Recommend further workup with thyroid ultrasound when appropriate if there is no known history. On this non IV contrast exam there is no specific abnormal lymph node enlargement present in the axillary regions hilum. Several small less than 1 cm in size in short axis nodes are seen in the mediastinum, nonpathologic by size criteria. There is some edema along the mediastinum inferiorly on the right side. Prominent supraclavicular node on the left on series 3, image 9 has short axis of 10 mm. Lungs/Pleura: There is a calcified left lower lobe lung nodule series 3, image 71 consistent with old granulomatous disease. Tiny left pleural effusion. No left-sided consolidation, pneumothorax otherwise. Right lung has a large pleural effusion with pleural  thickening. The inferior aspect is incompletely included in the  imaging field. Opacity seen in the visualized portions of the right lung with a rounded areas of opacity along the right lower lobe. Although this could be an infiltrate trait this also could be an area of rounded atelectasis. Upper Abdomen: Limited visualization of the upper abdomen. The adrenal glands are incompletely included in the imaging field. Spleen is enlarged measuring at least 15.5 cm where visible. Musculoskeletal: Mild degenerative changes along the spine. Old left chronic appearing rib deformities. IMPRESSION: Large right pleural effusion with pleural thickening. Associated opacity including rounded areas of opacity in the right lower lobe. Although this could be an infiltrate or lesion this could be an area of rounded atelectasis. Please correlate for any known history or prior. Very small left pleural effusion. Splenomegaly. Incompletely included in the imaging field. Further workup when clinically appropriate. 2.4 cm cystic lesion in the right thyroid lobe. Please correlate with any prior dedicated thyroid ultrasound when appropriate. Enlarged heart with the extensive vascular calcifications including potential significant stenosis along the great vessels including brachiocephalic, common carotid and right subclavian arteries. Further workup with CTA when clinically appropriate. Nonspecific mediastinal stranding with some small nodes including 1 slightly abnormally enlarged supraclavicular node on the left side. Aortic Atherosclerosis (ICD10-I70.0). Electronically Signed   By: Karen Kays M.D.   On: 03/29/2023 11:42   DG Chest 2 View Result Date: 03/29/2023 CLINICAL DATA:  Shortness of breath for 3 weeks EXAM: CHEST - 2 VIEW COMPARISON:  05/31/2019 FINDINGS: Large right pleural effusion. Small left. Underinflation with enlarged heart. Calcified aorta. No pneumothorax or edema. Film is under penetrated. IMPRESSION: Large right  pleural effusion and tiny left. Underinflation. Enlarged cardiopericardial silhouette. Please correlate with history or further workup when appropriate. Electronically Signed   By: Karen Kays M.D.   On: 03/29/2023 10:05    Microbiology Recent Results (from the past 240 hours)  Expectorated Sputum Assessment w Gram Stain, Rflx to Resp Cult     Status: None   Collection Time: 04/10/23 11:42 AM   Specimen: Sputum  Result Value Ref Range Status   Specimen Description SPUTUM  Final   Special Requests NONE  Final   Sputum evaluation   Final    Sputum specimen not acceptable for testing.  Please recollect.   Results Called to: Adria Devon RN 04/10/23 @ 2312 BY AB Performed at [facility], [address], [place], [place] [address]    Report Status 04/10/2023 FINAL  Final  Culture, blood (Routine X 2) w Reflex to ID Panel     Status: None   Collection Time: 04/13/23 11:20 PM   Specimen: BLOOD LEFT ARM  Result Value Ref Range Status   Specimen Description BLOOD LEFT ARM  Final   Special Requests   Final    BOTTLES DRAWN AEROBIC AND ANAEROBIC Blood Culture adequate volume   Culture   Final    NO GROWTH 5 DAYS Performed at [facility], [address], [place], [place] [address]    Report Status 04/18/2023 FINAL  Final  Culture, blood (Routine X 2) w Reflex to ID Panel     Status: None   Collection Time: 04/13/23 11:23 PM   Specimen: BLOOD LEFT HAND  Result Value Ref Range Status   Specimen Description BLOOD LEFT HAND  Final   Special Requests   Final    BOTTLES DRAWN AEROBIC AND ANAEROBIC Blood Culture adequate volume   Culture   Final    NO GROWTH 5 DAYS Performed at [facility], [address] [place], [place] [address]    Report Status 04/18/2023 FINAL  Final  MRSA Next Gen by PCR, Nasal     Status: None   Collection Time: 04/14/23 10:59 AM   Specimen: Nasal Mucosa; Nasal Swab  Result Value Ref Range Status   MRSA by PCR Next Gen NOT DETECTED NOT DETECTED Final     Comment: (NOTE) The GeneXpert MRSA Assay (FDA approved for NASAL specimens only), is one component of a comprehensive MRSA colonization surveillance program. It is not intended to diagnose MRSA infection nor to guide or monitor treatment for MRSA infections. Test performance is not FDA approved in patients less than 26 years old. Performed at [facility], [address] [place], [place] [address]   Resp panel by RT-PCR (RSV, Flu A&B, Covid) Anterior Nasal Swab     Status: None   Collection Time: 04/14/23 11:02 AM   Specimen: Anterior Nasal Swab  Result Value Ref Range Status   SARS Coronavirus 2 by RT PCR NEGATIVE NEGATIVE Final   Influenza A by PCR NEGATIVE NEGATIVE Final   Influenza B by PCR NEGATIVE NEGATIVE Final    Comment: (NOTE) The Xpert Xpress SARS-CoV-2/FLU/RSV plus assay is intended as an aid in the diagnosis of influenza from Nasopharyngeal swab specimens and should not be used as a sole basis for treatment. Nasal washings and aspirates are unacceptable for Xpert Xpress SARS-CoV-2/FLU/RSV testing.  Fact Sheet for Patients: BloggerCourse.com  Fact Sheet for Healthcare Providers: SeriousBroker.it  This test is not yet approved or cleared by the [place] FDA and has been authorized for detection and/or diagnosis of SARS-CoV-2 by FDA under an Emergency Use Authorization (EUA). This EUA will remain in effect (meaning this test can be used) for the duration of the COVID-19 declaration under Section 564(b)(1) of the Act, 21 U.S.C. section 360bbb-3(b)(1), unless the authorization is terminated or revoked.     Resp Syncytial Virus by PCR NEGATIVE NEGATIVE Final    Comment: (NOTE) Fact Sheet for Patients: BloggerCourse.com  Fact Sheet for Healthcare Providers: SeriousBroker.it  This test is not yet approved or cleared by the [place] FDA and has been  authorized for detection and/or diagnosis of SARS-CoV-2 by FDA under an Emergency Use Authorization (EUA). This EUA will remain in effect (meaning this test can be used) for the duration of the COVID-19 declaration under Section 564(b)(1) of the Act, 21 U.S.C. section 360bbb-3(b)(1), unless the authorization is terminated or revoked.  Performed at [facility], [address], [place], [place] [address]     Lab Basic Metabolic Panel: Recent Labs  Lab 04/13/23 [id number] 04/13/23 0424 04/14/23 0630 04/15/23 0325 04/16/23 0145 04/30/2023 0118 05/04/2023 0142 05/07/2023 0421 05/11/2023 0432  NA 125*  --  130* 126* 132* 132* 128* 129* 129*  K 4.9  --  4.0 4.1 4.1 4.9 4.6 4.6 4.5  CL 85*  --  90* 87* 91* 90*  --   --  89*  CO2 24  --  26 24 28 24   --   --  22  GLUCOSE 157*  --  217* 152* 181* 210*  --   --  177*  BUN 141*  --  94* 110* 69* 95*  --   --  96*  CREATININE 6.23*  --  4.41* 5.20* 3.72* 5.00*  --   --  5.13*  CALCIUM 7.9*  --  7.8* 8.1* 8.3* 8.6*  --   --  8.6*  MG  2.2 2.3 2.0 2.0 2.1 2.3  --   --   --   PHOS 6.2*  --  3.2 4.7* 3.7  --   --   --  5.6*   Liver Function Tests: Recent Labs  Lab 04/13/23 0146 04/14/23 0630 04/15/23 0325 04/16/23 0145 04/16/2023 0432  ALBUMIN 1.9* 1.9* 2.9* 2.7* 2.3*   No results for input(s): "LIPASE", "AMYLASE" in the last 168 hours. No results for input(s): "AMMONIA" in the last 168 hours. CBC: Recent Labs  Lab 04/14/23 0630 04/15/23 0325 04/16/23 0145 04/25/2023 0118 05/09/2023 0142 04/25/2023 0421 05/01/2023 0432  WBC 11.7* 13.7* 13.4* 14.6*  --   --  13.5*  HGB 7.0* 7.2* 7.4* 7.0* 7.8* 7.8* 7.0*  HCT 21.2* 22.0* 22.8* 23.0* 23.0* 23.0* 21.9*  MCV 97.7 98.7 101.3* 104.5*  --   --  100.9*  PLT 181 186 201 215  --   --  189   Cardiac Enzymes: No results for input(s): "CKTOTAL", "CKMB", "CKMBINDEX", "TROPONINI" in the last 168 hours. Sepsis Labs: Recent Labs  Lab 04/15/23 0325 04/16/23 0145 04/16/23 0745 05/06/2023 0118  05/13/2023 0432  WBC 13.7* 13.4*  --  14.6* 13.5*  LATICACIDVEN  --  0.7 0.5  --   --     Procedures/Operations     [employer] 04/18/2023, 12:16 PM

## 2023-05-14 NOTE — Progress Notes (Signed)
Very much appreciate great care by all but especially CCM.  I see plans for comfort care so renal will sign off.  Let us know if we can be of any further assistance  Cecille Aver

## 2023-05-14 NOTE — Plan of Care (Signed)
After significant event of CODE BLUE and subsequent events, I have notified family member Mykale Gandolfo (patient's spouse) of PEA arrest and ROSC.  Advised her that patient would be transferred to ICU and I would keep her informed of any further events.  She was appreciative of phone call and stated she would notify her son she would attempt to come to the hospital to see patient tonight.

## 2023-05-14 NOTE — Progress Notes (Signed)
Patient was lying in bed when his oxygen level decreased. Placed patient on non-rebreather and code was called. CPR initiated. ROSC returned. Pt transferred to ICU. Belongings sent with patient. Dr. Royal Piedra notified patients's spouse.

## 2023-05-14 DEATH — deceased

## 2023-05-17 MED FILL — Medication: Qty: 1 | Status: AC
